# Patient Record
Sex: Female | Born: 1935 | Race: White | Hispanic: No | Smoking: Former smoker
Health system: Southern US, Community
[De-identification: ages and names within clinical notes are randomized; demographics above are authoritative.]

## PROBLEM LIST (undated history)

## (undated) DIAGNOSIS — Z5111 Encounter for antineoplastic chemotherapy: Secondary | ICD-10-CM

## (undated) DIAGNOSIS — E7800 Pure hypercholesterolemia, unspecified: Secondary | ICD-10-CM

## (undated) DIAGNOSIS — J90 Pleural effusion, not elsewhere classified: Secondary | ICD-10-CM

## (undated) DIAGNOSIS — I509 Heart failure, unspecified: Secondary | ICD-10-CM

## (undated) DIAGNOSIS — C3490 Malignant neoplasm of unspecified part of unspecified bronchus or lung: Secondary | ICD-10-CM

## (undated) DIAGNOSIS — I2510 Atherosclerotic heart disease of native coronary artery without angina pectoris: Secondary | ICD-10-CM

## (undated) DIAGNOSIS — G2581 Restless legs syndrome: Secondary | ICD-10-CM

## (undated) DIAGNOSIS — I219 Acute myocardial infarction, unspecified: Secondary | ICD-10-CM

## (undated) DIAGNOSIS — Z7189 Other specified counseling: Secondary | ICD-10-CM

## (undated) DIAGNOSIS — I4891 Unspecified atrial fibrillation: Secondary | ICD-10-CM

## (undated) DIAGNOSIS — E860 Dehydration: Secondary | ICD-10-CM

## (undated) DIAGNOSIS — I059 Rheumatic mitral valve disease, unspecified: Secondary | ICD-10-CM

## (undated) DIAGNOSIS — H7090 Unspecified mastoiditis, unspecified ear: Secondary | ICD-10-CM

## (undated) DIAGNOSIS — I10 Essential (primary) hypertension: Secondary | ICD-10-CM

## (undated) DIAGNOSIS — Z789 Other specified health status: Secondary | ICD-10-CM

## (undated) DIAGNOSIS — R7303 Prediabetes: Secondary | ICD-10-CM

## (undated) DIAGNOSIS — Z9289 Personal history of other medical treatment: Secondary | ICD-10-CM

## (undated) DIAGNOSIS — R634 Abnormal weight loss: Secondary | ICD-10-CM

## (undated) DIAGNOSIS — J189 Pneumonia, unspecified organism: Secondary | ICD-10-CM

## (undated) DIAGNOSIS — I499 Cardiac arrhythmia, unspecified: Secondary | ICD-10-CM

## (undated) DIAGNOSIS — I359 Nonrheumatic aortic valve disorder, unspecified: Secondary | ICD-10-CM

## (undated) DIAGNOSIS — E119 Type 2 diabetes mellitus without complications: Secondary | ICD-10-CM

## (undated) DIAGNOSIS — K219 Gastro-esophageal reflux disease without esophagitis: Secondary | ICD-10-CM

## (undated) DIAGNOSIS — E039 Hypothyroidism, unspecified: Secondary | ICD-10-CM

## (undated) DIAGNOSIS — R0602 Shortness of breath: Secondary | ICD-10-CM

## (undated) DIAGNOSIS — M1990 Unspecified osteoarthritis, unspecified site: Secondary | ICD-10-CM

## (undated) DIAGNOSIS — Z5112 Encounter for antineoplastic immunotherapy: Secondary | ICD-10-CM

## (undated) DIAGNOSIS — I350 Nonrheumatic aortic (valve) stenosis: Secondary | ICD-10-CM

## (undated) DIAGNOSIS — M8580 Other specified disorders of bone density and structure, unspecified site: Secondary | ICD-10-CM

## (undated) DIAGNOSIS — J449 Chronic obstructive pulmonary disease, unspecified: Secondary | ICD-10-CM

## (undated) HISTORY — DX: Other specified counseling: Z71.89

## (undated) HISTORY — PX: LAPAROSCOPIC CHOLECYSTECTOMY: SUR755

## (undated) HISTORY — DX: Other specified health status: Z78.9

## (undated) HISTORY — DX: Malignant neoplasm of unspecified part of unspecified bronchus or lung: C34.90

## (undated) HISTORY — DX: Hypothyroidism, unspecified: E03.9

## (undated) HISTORY — DX: Chronic obstructive pulmonary disease, unspecified: J44.9

## (undated) HISTORY — PX: TONSILLECTOMY: SUR1361

## (undated) HISTORY — DX: Abnormal weight loss: R63.4

## (undated) HISTORY — DX: Encounter for antineoplastic chemotherapy: Z51.11

## (undated) HISTORY — DX: Restless legs syndrome: G25.81

## (undated) HISTORY — DX: Essential (primary) hypertension: I10

## (undated) HISTORY — PX: AORTIC VALVE REPLACEMENT: SHX41

## (undated) HISTORY — DX: Atherosclerotic heart disease of native coronary artery without angina pectoris: I25.10

## (undated) HISTORY — DX: Unspecified atrial fibrillation: I48.91

## (undated) HISTORY — DX: Unspecified mastoiditis, unspecified ear: H70.90

## (undated) HISTORY — DX: Dehydration: E86.0

## (undated) HISTORY — DX: Nonrheumatic aortic (valve) stenosis: I35.0

## (undated) HISTORY — DX: Pure hypercholesterolemia, unspecified: E78.00

## (undated) HISTORY — PX: MITRAL VALVE REPLACEMENT: SHX147

## (undated) HISTORY — DX: Rheumatic mitral valve disease, unspecified: I05.9

## (undated) HISTORY — DX: Nonrheumatic aortic valve disorder, unspecified: I35.9

## (undated) HISTORY — DX: Other specified disorders of bone density and structure, unspecified site: M85.80

## (undated) HISTORY — DX: Encounter for antineoplastic immunotherapy: Z51.12

## (undated) HISTORY — DX: Heart failure, unspecified: I50.9

## (undated) HISTORY — DX: Pleural effusion, not elsewhere classified: J90

---

## 2011-03-22 HISTORY — PX: CORONARY ARTERY BYPASS GRAFT: SHX141

## 2011-03-27 DIAGNOSIS — Z888 Allergy status to other drugs, medicaments and biological substances status: Secondary | ICD-10-CM | POA: Diagnosis not present

## 2011-03-27 DIAGNOSIS — I509 Heart failure, unspecified: Secondary | ICD-10-CM | POA: Diagnosis not present

## 2011-03-27 DIAGNOSIS — I4891 Unspecified atrial fibrillation: Secondary | ICD-10-CM | POA: Diagnosis present

## 2011-03-27 DIAGNOSIS — J449 Chronic obstructive pulmonary disease, unspecified: Secondary | ICD-10-CM | POA: Diagnosis present

## 2011-03-27 DIAGNOSIS — I099 Rheumatic heart disease, unspecified: Secondary | ICD-10-CM | POA: Diagnosis not present

## 2011-03-27 DIAGNOSIS — E876 Hypokalemia: Secondary | ICD-10-CM | POA: Diagnosis not present

## 2011-03-27 DIAGNOSIS — G2581 Restless legs syndrome: Secondary | ICD-10-CM | POA: Diagnosis present

## 2011-03-27 DIAGNOSIS — I359 Nonrheumatic aortic valve disorder, unspecified: Secondary | ICD-10-CM | POA: Diagnosis not present

## 2011-03-27 DIAGNOSIS — K562 Volvulus: Secondary | ICD-10-CM | POA: Diagnosis not present

## 2011-03-27 DIAGNOSIS — K2940 Chronic atrophic gastritis without bleeding: Secondary | ICD-10-CM | POA: Diagnosis not present

## 2011-03-27 DIAGNOSIS — I2510 Atherosclerotic heart disease of native coronary artery without angina pectoris: Secondary | ICD-10-CM | POA: Diagnosis present

## 2011-03-27 DIAGNOSIS — Z7982 Long term (current) use of aspirin: Secondary | ICD-10-CM | POA: Diagnosis not present

## 2011-03-27 DIAGNOSIS — Z87891 Personal history of nicotine dependence: Secondary | ICD-10-CM | POA: Diagnosis not present

## 2011-03-27 DIAGNOSIS — K8020 Calculus of gallbladder without cholecystitis without obstruction: Secondary | ICD-10-CM | POA: Diagnosis not present

## 2011-03-27 DIAGNOSIS — I5031 Acute diastolic (congestive) heart failure: Secondary | ICD-10-CM | POA: Diagnosis not present

## 2011-03-27 DIAGNOSIS — I5033 Acute on chronic diastolic (congestive) heart failure: Secondary | ICD-10-CM | POA: Diagnosis present

## 2011-03-27 DIAGNOSIS — R0609 Other forms of dyspnea: Secondary | ICD-10-CM | POA: Diagnosis not present

## 2011-03-27 DIAGNOSIS — K2970 Gastritis, unspecified, without bleeding: Secondary | ICD-10-CM | POA: Diagnosis present

## 2011-03-27 DIAGNOSIS — I059 Rheumatic mitral valve disease, unspecified: Secondary | ICD-10-CM | POA: Diagnosis not present

## 2011-03-27 DIAGNOSIS — R0602 Shortness of breath: Secondary | ICD-10-CM | POA: Diagnosis not present

## 2011-03-27 DIAGNOSIS — K2990 Gastroduodenitis, unspecified, without bleeding: Secondary | ICD-10-CM | POA: Diagnosis present

## 2011-03-27 DIAGNOSIS — Z954 Presence of other heart-valve replacement: Secondary | ICD-10-CM | POA: Diagnosis not present

## 2011-03-27 DIAGNOSIS — N281 Cyst of kidney, acquired: Secondary | ICD-10-CM | POA: Diagnosis not present

## 2011-03-27 DIAGNOSIS — D509 Iron deficiency anemia, unspecified: Secondary | ICD-10-CM | POA: Diagnosis not present

## 2011-03-27 DIAGNOSIS — Q438 Other specified congenital malformations of intestine: Secondary | ICD-10-CM | POA: Diagnosis not present

## 2011-03-27 DIAGNOSIS — E039 Hypothyroidism, unspecified: Secondary | ICD-10-CM | POA: Diagnosis present

## 2011-03-27 DIAGNOSIS — K219 Gastro-esophageal reflux disease without esophagitis: Secondary | ICD-10-CM | POA: Diagnosis present

## 2011-03-27 DIAGNOSIS — I080 Rheumatic disorders of both mitral and aortic valves: Secondary | ICD-10-CM | POA: Diagnosis not present

## 2011-03-27 DIAGNOSIS — I2789 Other specified pulmonary heart diseases: Secondary | ICD-10-CM | POA: Diagnosis present

## 2011-03-27 DIAGNOSIS — Z951 Presence of aortocoronary bypass graft: Secondary | ICD-10-CM | POA: Diagnosis not present

## 2011-03-27 DIAGNOSIS — I050 Rheumatic mitral stenosis: Secondary | ICD-10-CM | POA: Diagnosis not present

## 2011-03-27 DIAGNOSIS — K5730 Diverticulosis of large intestine without perforation or abscess without bleeding: Secondary | ICD-10-CM | POA: Diagnosis not present

## 2011-04-04 DIAGNOSIS — J9819 Other pulmonary collapse: Secondary | ICD-10-CM | POA: Diagnosis not present

## 2011-04-04 DIAGNOSIS — Z951 Presence of aortocoronary bypass graft: Secondary | ICD-10-CM | POA: Diagnosis not present

## 2011-04-04 DIAGNOSIS — I050 Rheumatic mitral stenosis: Secondary | ICD-10-CM | POA: Diagnosis not present

## 2011-04-04 DIAGNOSIS — T82897A Other specified complication of cardiac prosthetic devices, implants and grafts, initial encounter: Secondary | ICD-10-CM | POA: Diagnosis not present

## 2011-04-04 DIAGNOSIS — N179 Acute kidney failure, unspecified: Secondary | ICD-10-CM | POA: Diagnosis not present

## 2011-04-04 DIAGNOSIS — K219 Gastro-esophageal reflux disease without esophagitis: Secondary | ICD-10-CM | POA: Diagnosis present

## 2011-04-04 DIAGNOSIS — Z8544 Personal history of malignant neoplasm of other female genital organs: Secondary | ICD-10-CM | POA: Diagnosis not present

## 2011-04-04 DIAGNOSIS — I2510 Atherosclerotic heart disease of native coronary artery without angina pectoris: Secondary | ICD-10-CM | POA: Diagnosis not present

## 2011-04-04 DIAGNOSIS — J811 Chronic pulmonary edema: Secondary | ICD-10-CM | POA: Diagnosis not present

## 2011-04-04 DIAGNOSIS — J90 Pleural effusion, not elsewhere classified: Secondary | ICD-10-CM | POA: Diagnosis not present

## 2011-04-04 DIAGNOSIS — R0989 Other specified symptoms and signs involving the circulatory and respiratory systems: Secondary | ICD-10-CM | POA: Diagnosis not present

## 2011-04-04 DIAGNOSIS — I25810 Atherosclerosis of coronary artery bypass graft(s) without angina pectoris: Secondary | ICD-10-CM | POA: Diagnosis not present

## 2011-04-04 DIAGNOSIS — I7090 Unspecified atherosclerosis: Secondary | ICD-10-CM | POA: Diagnosis not present

## 2011-04-04 DIAGNOSIS — J189 Pneumonia, unspecified organism: Secondary | ICD-10-CM | POA: Diagnosis not present

## 2011-04-04 DIAGNOSIS — D72829 Elevated white blood cell count, unspecified: Secondary | ICD-10-CM | POA: Diagnosis not present

## 2011-04-04 DIAGNOSIS — I080 Rheumatic disorders of both mitral and aortic valves: Secondary | ICD-10-CM | POA: Diagnosis present

## 2011-04-04 DIAGNOSIS — I2789 Other specified pulmonary heart diseases: Secondary | ICD-10-CM | POA: Diagnosis not present

## 2011-04-04 DIAGNOSIS — Z954 Presence of other heart-valve replacement: Secondary | ICD-10-CM | POA: Diagnosis not present

## 2011-04-04 DIAGNOSIS — Z9911 Dependence on respirator [ventilator] status: Secondary | ICD-10-CM | POA: Diagnosis not present

## 2011-04-04 DIAGNOSIS — R5081 Fever presenting with conditions classified elsewhere: Secondary | ICD-10-CM | POA: Diagnosis not present

## 2011-04-04 DIAGNOSIS — E039 Hypothyroidism, unspecified: Secondary | ICD-10-CM | POA: Diagnosis present

## 2011-04-04 DIAGNOSIS — R0602 Shortness of breath: Secondary | ICD-10-CM | POA: Diagnosis not present

## 2011-04-04 DIAGNOSIS — D696 Thrombocytopenia, unspecified: Secondary | ICD-10-CM | POA: Diagnosis not present

## 2011-04-04 DIAGNOSIS — I4891 Unspecified atrial fibrillation: Secondary | ICD-10-CM | POA: Diagnosis not present

## 2011-04-04 DIAGNOSIS — I509 Heart failure, unspecified: Secondary | ICD-10-CM | POA: Diagnosis present

## 2011-04-04 DIAGNOSIS — J9620 Acute and chronic respiratory failure, unspecified whether with hypoxia or hypercapnia: Secondary | ICD-10-CM | POA: Diagnosis not present

## 2011-04-04 DIAGNOSIS — I059 Rheumatic mitral valve disease, unspecified: Secondary | ICD-10-CM | POA: Diagnosis not present

## 2011-04-04 DIAGNOSIS — Z9889 Other specified postprocedural states: Secondary | ICD-10-CM | POA: Diagnosis not present

## 2011-04-04 DIAGNOSIS — I359 Nonrheumatic aortic valve disorder, unspecified: Secondary | ICD-10-CM | POA: Diagnosis not present

## 2011-04-04 DIAGNOSIS — N289 Disorder of kidney and ureter, unspecified: Secondary | ICD-10-CM | POA: Diagnosis not present

## 2011-04-04 DIAGNOSIS — J9600 Acute respiratory failure, unspecified whether with hypoxia or hypercapnia: Secondary | ICD-10-CM | POA: Diagnosis not present

## 2011-04-13 DIAGNOSIS — I2510 Atherosclerotic heart disease of native coronary artery without angina pectoris: Secondary | ICD-10-CM | POA: Diagnosis not present

## 2011-04-13 DIAGNOSIS — R0989 Other specified symptoms and signs involving the circulatory and respiratory systems: Secondary | ICD-10-CM | POA: Diagnosis not present

## 2011-04-13 DIAGNOSIS — Z8542 Personal history of malignant neoplasm of other parts of uterus: Secondary | ICD-10-CM | POA: Diagnosis not present

## 2011-04-13 DIAGNOSIS — D500 Iron deficiency anemia secondary to blood loss (chronic): Secondary | ICD-10-CM | POA: Diagnosis not present

## 2011-04-13 DIAGNOSIS — I359 Nonrheumatic aortic valve disorder, unspecified: Secondary | ICD-10-CM | POA: Diagnosis not present

## 2011-04-13 DIAGNOSIS — J449 Chronic obstructive pulmonary disease, unspecified: Secondary | ICD-10-CM | POA: Diagnosis not present

## 2011-04-13 DIAGNOSIS — Z952 Presence of prosthetic heart valve: Secondary | ICD-10-CM | POA: Diagnosis not present

## 2011-04-13 DIAGNOSIS — K219 Gastro-esophageal reflux disease without esophagitis: Secondary | ICD-10-CM | POA: Diagnosis not present

## 2011-04-13 DIAGNOSIS — J90 Pleural effusion, not elsewhere classified: Secondary | ICD-10-CM | POA: Diagnosis not present

## 2011-04-13 DIAGNOSIS — I2789 Other specified pulmonary heart diseases: Secondary | ICD-10-CM | POA: Diagnosis not present

## 2011-04-13 DIAGNOSIS — E871 Hypo-osmolality and hyponatremia: Secondary | ICD-10-CM | POA: Diagnosis not present

## 2011-04-13 DIAGNOSIS — R0609 Other forms of dyspnea: Secondary | ICD-10-CM | POA: Diagnosis not present

## 2011-04-13 DIAGNOSIS — J189 Pneumonia, unspecified organism: Secondary | ICD-10-CM | POA: Diagnosis not present

## 2011-04-13 DIAGNOSIS — I501 Left ventricular failure: Secondary | ICD-10-CM | POA: Diagnosis not present

## 2011-04-13 DIAGNOSIS — Z48812 Encounter for surgical aftercare following surgery on the circulatory system: Secondary | ICD-10-CM | POA: Diagnosis not present

## 2011-04-13 DIAGNOSIS — IMO0002 Reserved for concepts with insufficient information to code with codable children: Secondary | ICD-10-CM | POA: Diagnosis not present

## 2011-04-13 DIAGNOSIS — J438 Other emphysema: Secondary | ICD-10-CM | POA: Diagnosis not present

## 2011-04-13 DIAGNOSIS — I099 Rheumatic heart disease, unspecified: Secondary | ICD-10-CM | POA: Diagnosis not present

## 2011-04-13 DIAGNOSIS — Z9889 Other specified postprocedural states: Secondary | ICD-10-CM | POA: Diagnosis not present

## 2011-04-13 DIAGNOSIS — J9819 Other pulmonary collapse: Secondary | ICD-10-CM | POA: Diagnosis not present

## 2011-04-13 DIAGNOSIS — I509 Heart failure, unspecified: Secondary | ICD-10-CM | POA: Diagnosis not present

## 2011-04-13 DIAGNOSIS — E46 Unspecified protein-calorie malnutrition: Secondary | ICD-10-CM | POA: Diagnosis not present

## 2011-04-13 DIAGNOSIS — I059 Rheumatic mitral valve disease, unspecified: Secondary | ICD-10-CM | POA: Diagnosis not present

## 2011-04-13 DIAGNOSIS — R5381 Other malaise: Secondary | ICD-10-CM | POA: Diagnosis not present

## 2011-04-13 DIAGNOSIS — I4891 Unspecified atrial fibrillation: Secondary | ICD-10-CM | POA: Diagnosis not present

## 2011-04-13 DIAGNOSIS — Z951 Presence of aortocoronary bypass graft: Secondary | ICD-10-CM | POA: Diagnosis not present

## 2011-04-13 DIAGNOSIS — E039 Hypothyroidism, unspecified: Secondary | ICD-10-CM | POA: Diagnosis not present

## 2011-04-14 DIAGNOSIS — J90 Pleural effusion, not elsewhere classified: Secondary | ICD-10-CM | POA: Diagnosis not present

## 2011-04-14 DIAGNOSIS — I359 Nonrheumatic aortic valve disorder, unspecified: Secondary | ICD-10-CM | POA: Diagnosis not present

## 2011-04-14 DIAGNOSIS — J449 Chronic obstructive pulmonary disease, unspecified: Secondary | ICD-10-CM | POA: Diagnosis not present

## 2011-04-14 DIAGNOSIS — E039 Hypothyroidism, unspecified: Secondary | ICD-10-CM | POA: Diagnosis not present

## 2011-04-14 DIAGNOSIS — J438 Other emphysema: Secondary | ICD-10-CM | POA: Diagnosis not present

## 2011-04-14 DIAGNOSIS — I509 Heart failure, unspecified: Secondary | ICD-10-CM | POA: Diagnosis not present

## 2011-04-14 DIAGNOSIS — I059 Rheumatic mitral valve disease, unspecified: Secondary | ICD-10-CM | POA: Diagnosis not present

## 2011-04-16 DIAGNOSIS — I359 Nonrheumatic aortic valve disorder, unspecified: Secondary | ICD-10-CM | POA: Diagnosis not present

## 2011-04-16 DIAGNOSIS — I059 Rheumatic mitral valve disease, unspecified: Secondary | ICD-10-CM | POA: Diagnosis not present

## 2011-04-16 DIAGNOSIS — J438 Other emphysema: Secondary | ICD-10-CM | POA: Diagnosis not present

## 2011-04-16 DIAGNOSIS — E039 Hypothyroidism, unspecified: Secondary | ICD-10-CM | POA: Diagnosis not present

## 2011-04-17 DIAGNOSIS — I359 Nonrheumatic aortic valve disorder, unspecified: Secondary | ICD-10-CM | POA: Diagnosis not present

## 2011-04-17 DIAGNOSIS — J438 Other emphysema: Secondary | ICD-10-CM | POA: Diagnosis not present

## 2011-04-17 DIAGNOSIS — I059 Rheumatic mitral valve disease, unspecified: Secondary | ICD-10-CM | POA: Diagnosis not present

## 2011-04-17 DIAGNOSIS — E039 Hypothyroidism, unspecified: Secondary | ICD-10-CM | POA: Diagnosis not present

## 2011-04-23 DIAGNOSIS — J441 Chronic obstructive pulmonary disease with (acute) exacerbation: Secondary | ICD-10-CM | POA: Diagnosis not present

## 2011-04-23 DIAGNOSIS — R5383 Other fatigue: Secondary | ICD-10-CM | POA: Diagnosis not present

## 2011-04-23 DIAGNOSIS — Z48812 Encounter for surgical aftercare following surgery on the circulatory system: Secondary | ICD-10-CM | POA: Diagnosis not present

## 2011-04-23 DIAGNOSIS — I509 Heart failure, unspecified: Secondary | ICD-10-CM | POA: Diagnosis not present

## 2011-04-23 DIAGNOSIS — I2789 Other specified pulmonary heart diseases: Secondary | ICD-10-CM | POA: Diagnosis not present

## 2011-04-23 DIAGNOSIS — R5381 Other malaise: Secondary | ICD-10-CM | POA: Diagnosis not present

## 2011-04-24 DIAGNOSIS — I509 Heart failure, unspecified: Secondary | ICD-10-CM | POA: Diagnosis not present

## 2011-04-24 DIAGNOSIS — R5383 Other fatigue: Secondary | ICD-10-CM | POA: Diagnosis not present

## 2011-04-24 DIAGNOSIS — J441 Chronic obstructive pulmonary disease with (acute) exacerbation: Secondary | ICD-10-CM | POA: Diagnosis not present

## 2011-04-24 DIAGNOSIS — I2789 Other specified pulmonary heart diseases: Secondary | ICD-10-CM | POA: Diagnosis not present

## 2011-04-24 DIAGNOSIS — R5381 Other malaise: Secondary | ICD-10-CM | POA: Diagnosis not present

## 2011-04-24 DIAGNOSIS — Z48812 Encounter for surgical aftercare following surgery on the circulatory system: Secondary | ICD-10-CM | POA: Diagnosis not present

## 2011-04-25 DIAGNOSIS — J441 Chronic obstructive pulmonary disease with (acute) exacerbation: Secondary | ICD-10-CM | POA: Diagnosis not present

## 2011-04-25 DIAGNOSIS — I2789 Other specified pulmonary heart diseases: Secondary | ICD-10-CM | POA: Diagnosis not present

## 2011-04-25 DIAGNOSIS — Z48812 Encounter for surgical aftercare following surgery on the circulatory system: Secondary | ICD-10-CM | POA: Diagnosis not present

## 2011-04-25 DIAGNOSIS — I509 Heart failure, unspecified: Secondary | ICD-10-CM | POA: Diagnosis not present

## 2011-04-25 DIAGNOSIS — R5383 Other fatigue: Secondary | ICD-10-CM | POA: Diagnosis not present

## 2011-04-26 DIAGNOSIS — I2789 Other specified pulmonary heart diseases: Secondary | ICD-10-CM | POA: Diagnosis not present

## 2011-04-26 DIAGNOSIS — J441 Chronic obstructive pulmonary disease with (acute) exacerbation: Secondary | ICD-10-CM | POA: Diagnosis not present

## 2011-04-26 DIAGNOSIS — Z48812 Encounter for surgical aftercare following surgery on the circulatory system: Secondary | ICD-10-CM | POA: Diagnosis not present

## 2011-04-26 DIAGNOSIS — R5381 Other malaise: Secondary | ICD-10-CM | POA: Diagnosis not present

## 2011-04-26 DIAGNOSIS — I509 Heart failure, unspecified: Secondary | ICD-10-CM | POA: Diagnosis not present

## 2011-04-28 DIAGNOSIS — R0602 Shortness of breath: Secondary | ICD-10-CM | POA: Diagnosis not present

## 2011-04-28 DIAGNOSIS — I509 Heart failure, unspecified: Secondary | ICD-10-CM | POA: Diagnosis not present

## 2011-04-28 DIAGNOSIS — I2789 Other specified pulmonary heart diseases: Secondary | ICD-10-CM | POA: Diagnosis not present

## 2011-04-28 DIAGNOSIS — R5381 Other malaise: Secondary | ICD-10-CM | POA: Diagnosis not present

## 2011-04-28 DIAGNOSIS — Z48812 Encounter for surgical aftercare following surgery on the circulatory system: Secondary | ICD-10-CM | POA: Diagnosis not present

## 2011-04-28 DIAGNOSIS — J441 Chronic obstructive pulmonary disease with (acute) exacerbation: Secondary | ICD-10-CM | POA: Diagnosis not present

## 2011-04-29 DIAGNOSIS — I2789 Other specified pulmonary heart diseases: Secondary | ICD-10-CM | POA: Diagnosis not present

## 2011-04-29 DIAGNOSIS — I509 Heart failure, unspecified: Secondary | ICD-10-CM | POA: Diagnosis not present

## 2011-04-29 DIAGNOSIS — J441 Chronic obstructive pulmonary disease with (acute) exacerbation: Secondary | ICD-10-CM | POA: Diagnosis not present

## 2011-04-29 DIAGNOSIS — Z48812 Encounter for surgical aftercare following surgery on the circulatory system: Secondary | ICD-10-CM | POA: Diagnosis not present

## 2011-04-29 DIAGNOSIS — R5381 Other malaise: Secondary | ICD-10-CM | POA: Diagnosis not present

## 2011-05-02 DIAGNOSIS — I2789 Other specified pulmonary heart diseases: Secondary | ICD-10-CM | POA: Diagnosis not present

## 2011-05-02 DIAGNOSIS — I509 Heart failure, unspecified: Secondary | ICD-10-CM | POA: Diagnosis not present

## 2011-05-02 DIAGNOSIS — Z48812 Encounter for surgical aftercare following surgery on the circulatory system: Secondary | ICD-10-CM | POA: Diagnosis not present

## 2011-05-02 DIAGNOSIS — J441 Chronic obstructive pulmonary disease with (acute) exacerbation: Secondary | ICD-10-CM | POA: Diagnosis not present

## 2011-05-02 DIAGNOSIS — R5381 Other malaise: Secondary | ICD-10-CM | POA: Diagnosis not present

## 2011-05-04 DIAGNOSIS — I099 Rheumatic heart disease, unspecified: Secondary | ICD-10-CM | POA: Diagnosis not present

## 2011-05-04 DIAGNOSIS — I2510 Atherosclerotic heart disease of native coronary artery without angina pectoris: Secondary | ICD-10-CM | POA: Diagnosis not present

## 2011-05-04 DIAGNOSIS — I509 Heart failure, unspecified: Secondary | ICD-10-CM | POA: Diagnosis not present

## 2011-05-05 DIAGNOSIS — I509 Heart failure, unspecified: Secondary | ICD-10-CM | POA: Diagnosis not present

## 2011-05-05 DIAGNOSIS — J441 Chronic obstructive pulmonary disease with (acute) exacerbation: Secondary | ICD-10-CM | POA: Diagnosis not present

## 2011-05-05 DIAGNOSIS — R5381 Other malaise: Secondary | ICD-10-CM | POA: Diagnosis not present

## 2011-05-05 DIAGNOSIS — I2789 Other specified pulmonary heart diseases: Secondary | ICD-10-CM | POA: Diagnosis not present

## 2011-05-05 DIAGNOSIS — Z48812 Encounter for surgical aftercare following surgery on the circulatory system: Secondary | ICD-10-CM | POA: Diagnosis not present

## 2011-05-06 DIAGNOSIS — I10 Essential (primary) hypertension: Secondary | ICD-10-CM | POA: Diagnosis not present

## 2011-05-06 DIAGNOSIS — I2510 Atherosclerotic heart disease of native coronary artery without angina pectoris: Secondary | ICD-10-CM | POA: Diagnosis not present

## 2011-05-09 DIAGNOSIS — Z48812 Encounter for surgical aftercare following surgery on the circulatory system: Secondary | ICD-10-CM | POA: Diagnosis not present

## 2011-05-09 DIAGNOSIS — J441 Chronic obstructive pulmonary disease with (acute) exacerbation: Secondary | ICD-10-CM | POA: Diagnosis not present

## 2011-05-09 DIAGNOSIS — R5383 Other fatigue: Secondary | ICD-10-CM | POA: Diagnosis not present

## 2011-05-09 DIAGNOSIS — I509 Heart failure, unspecified: Secondary | ICD-10-CM | POA: Diagnosis not present

## 2011-05-09 DIAGNOSIS — I2789 Other specified pulmonary heart diseases: Secondary | ICD-10-CM | POA: Diagnosis not present

## 2011-05-09 DIAGNOSIS — R5381 Other malaise: Secondary | ICD-10-CM | POA: Diagnosis not present

## 2011-05-10 DIAGNOSIS — Z48812 Encounter for surgical aftercare following surgery on the circulatory system: Secondary | ICD-10-CM | POA: Diagnosis not present

## 2011-05-10 DIAGNOSIS — R5383 Other fatigue: Secondary | ICD-10-CM | POA: Diagnosis not present

## 2011-05-10 DIAGNOSIS — J441 Chronic obstructive pulmonary disease with (acute) exacerbation: Secondary | ICD-10-CM | POA: Diagnosis not present

## 2011-05-10 DIAGNOSIS — I509 Heart failure, unspecified: Secondary | ICD-10-CM | POA: Diagnosis not present

## 2011-05-10 DIAGNOSIS — I2789 Other specified pulmonary heart diseases: Secondary | ICD-10-CM | POA: Diagnosis not present

## 2011-05-13 DIAGNOSIS — I2789 Other specified pulmonary heart diseases: Secondary | ICD-10-CM | POA: Diagnosis not present

## 2011-05-13 DIAGNOSIS — Z48812 Encounter for surgical aftercare following surgery on the circulatory system: Secondary | ICD-10-CM | POA: Diagnosis not present

## 2011-05-13 DIAGNOSIS — J441 Chronic obstructive pulmonary disease with (acute) exacerbation: Secondary | ICD-10-CM | POA: Diagnosis not present

## 2011-05-13 DIAGNOSIS — R5381 Other malaise: Secondary | ICD-10-CM | POA: Diagnosis not present

## 2011-05-13 DIAGNOSIS — I509 Heart failure, unspecified: Secondary | ICD-10-CM | POA: Diagnosis not present

## 2011-05-17 DIAGNOSIS — I509 Heart failure, unspecified: Secondary | ICD-10-CM | POA: Diagnosis not present

## 2011-05-17 DIAGNOSIS — I2789 Other specified pulmonary heart diseases: Secondary | ICD-10-CM | POA: Diagnosis not present

## 2011-05-17 DIAGNOSIS — R5381 Other malaise: Secondary | ICD-10-CM | POA: Diagnosis not present

## 2011-05-17 DIAGNOSIS — J441 Chronic obstructive pulmonary disease with (acute) exacerbation: Secondary | ICD-10-CM | POA: Diagnosis not present

## 2011-05-17 DIAGNOSIS — Z48812 Encounter for surgical aftercare following surgery on the circulatory system: Secondary | ICD-10-CM | POA: Diagnosis not present

## 2011-05-18 DIAGNOSIS — J449 Chronic obstructive pulmonary disease, unspecified: Secondary | ICD-10-CM | POA: Diagnosis not present

## 2011-05-18 DIAGNOSIS — R0602 Shortness of breath: Secondary | ICD-10-CM | POA: Diagnosis not present

## 2011-05-20 DIAGNOSIS — I509 Heart failure, unspecified: Secondary | ICD-10-CM | POA: Diagnosis not present

## 2011-05-20 DIAGNOSIS — J441 Chronic obstructive pulmonary disease with (acute) exacerbation: Secondary | ICD-10-CM | POA: Diagnosis not present

## 2011-05-20 DIAGNOSIS — I2789 Other specified pulmonary heart diseases: Secondary | ICD-10-CM | POA: Diagnosis not present

## 2011-05-20 DIAGNOSIS — R5381 Other malaise: Secondary | ICD-10-CM | POA: Diagnosis not present

## 2011-05-20 DIAGNOSIS — Z48812 Encounter for surgical aftercare following surgery on the circulatory system: Secondary | ICD-10-CM | POA: Diagnosis not present

## 2011-05-21 DIAGNOSIS — I2789 Other specified pulmonary heart diseases: Secondary | ICD-10-CM | POA: Diagnosis not present

## 2011-05-21 DIAGNOSIS — Z48812 Encounter for surgical aftercare following surgery on the circulatory system: Secondary | ICD-10-CM | POA: Diagnosis not present

## 2011-05-21 DIAGNOSIS — R5381 Other malaise: Secondary | ICD-10-CM | POA: Diagnosis not present

## 2011-05-21 DIAGNOSIS — J441 Chronic obstructive pulmonary disease with (acute) exacerbation: Secondary | ICD-10-CM | POA: Diagnosis not present

## 2011-05-21 DIAGNOSIS — I509 Heart failure, unspecified: Secondary | ICD-10-CM | POA: Diagnosis not present

## 2011-05-22 DIAGNOSIS — Z48812 Encounter for surgical aftercare following surgery on the circulatory system: Secondary | ICD-10-CM | POA: Diagnosis not present

## 2011-05-22 DIAGNOSIS — J441 Chronic obstructive pulmonary disease with (acute) exacerbation: Secondary | ICD-10-CM | POA: Diagnosis not present

## 2011-05-22 DIAGNOSIS — I509 Heart failure, unspecified: Secondary | ICD-10-CM | POA: Diagnosis not present

## 2011-05-22 DIAGNOSIS — I2789 Other specified pulmonary heart diseases: Secondary | ICD-10-CM | POA: Diagnosis not present

## 2011-05-22 DIAGNOSIS — R5381 Other malaise: Secondary | ICD-10-CM | POA: Diagnosis not present

## 2011-05-23 DIAGNOSIS — I2789 Other specified pulmonary heart diseases: Secondary | ICD-10-CM | POA: Diagnosis not present

## 2011-05-23 DIAGNOSIS — J441 Chronic obstructive pulmonary disease with (acute) exacerbation: Secondary | ICD-10-CM | POA: Diagnosis not present

## 2011-05-23 DIAGNOSIS — R5381 Other malaise: Secondary | ICD-10-CM | POA: Diagnosis not present

## 2011-05-23 DIAGNOSIS — Z48812 Encounter for surgical aftercare following surgery on the circulatory system: Secondary | ICD-10-CM | POA: Diagnosis not present

## 2011-05-23 DIAGNOSIS — I509 Heart failure, unspecified: Secondary | ICD-10-CM | POA: Diagnosis not present

## 2011-05-24 DIAGNOSIS — R5383 Other fatigue: Secondary | ICD-10-CM | POA: Diagnosis not present

## 2011-05-24 DIAGNOSIS — R5381 Other malaise: Secondary | ICD-10-CM | POA: Diagnosis not present

## 2011-05-24 DIAGNOSIS — J441 Chronic obstructive pulmonary disease with (acute) exacerbation: Secondary | ICD-10-CM | POA: Diagnosis not present

## 2011-05-24 DIAGNOSIS — I2789 Other specified pulmonary heart diseases: Secondary | ICD-10-CM | POA: Diagnosis not present

## 2011-05-24 DIAGNOSIS — I509 Heart failure, unspecified: Secondary | ICD-10-CM | POA: Diagnosis not present

## 2011-05-24 DIAGNOSIS — Z48812 Encounter for surgical aftercare following surgery on the circulatory system: Secondary | ICD-10-CM | POA: Diagnosis not present

## 2011-05-25 DIAGNOSIS — R079 Chest pain, unspecified: Secondary | ICD-10-CM | POA: Diagnosis not present

## 2011-05-25 DIAGNOSIS — K8020 Calculus of gallbladder without cholecystitis without obstruction: Secondary | ICD-10-CM | POA: Diagnosis not present

## 2011-05-25 DIAGNOSIS — Z48812 Encounter for surgical aftercare following surgery on the circulatory system: Secondary | ICD-10-CM | POA: Diagnosis not present

## 2011-05-25 DIAGNOSIS — I509 Heart failure, unspecified: Secondary | ICD-10-CM | POA: Diagnosis not present

## 2011-05-25 DIAGNOSIS — I2789 Other specified pulmonary heart diseases: Secondary | ICD-10-CM | POA: Diagnosis not present

## 2011-05-25 DIAGNOSIS — J441 Chronic obstructive pulmonary disease with (acute) exacerbation: Secondary | ICD-10-CM | POA: Diagnosis not present

## 2011-05-25 DIAGNOSIS — R5383 Other fatigue: Secondary | ICD-10-CM | POA: Diagnosis not present

## 2011-05-25 DIAGNOSIS — J9819 Other pulmonary collapse: Secondary | ICD-10-CM | POA: Diagnosis not present

## 2011-05-26 DIAGNOSIS — J441 Chronic obstructive pulmonary disease with (acute) exacerbation: Secondary | ICD-10-CM | POA: Diagnosis not present

## 2011-05-26 DIAGNOSIS — Z48812 Encounter for surgical aftercare following surgery on the circulatory system: Secondary | ICD-10-CM | POA: Diagnosis not present

## 2011-05-26 DIAGNOSIS — R5381 Other malaise: Secondary | ICD-10-CM | POA: Diagnosis not present

## 2011-05-26 DIAGNOSIS — K8020 Calculus of gallbladder without cholecystitis without obstruction: Secondary | ICD-10-CM | POA: Diagnosis not present

## 2011-05-26 DIAGNOSIS — I509 Heart failure, unspecified: Secondary | ICD-10-CM | POA: Diagnosis not present

## 2011-05-26 DIAGNOSIS — I2789 Other specified pulmonary heart diseases: Secondary | ICD-10-CM | POA: Diagnosis not present

## 2011-05-27 DIAGNOSIS — R1011 Right upper quadrant pain: Secondary | ICD-10-CM | POA: Diagnosis not present

## 2011-05-27 DIAGNOSIS — I2789 Other specified pulmonary heart diseases: Secondary | ICD-10-CM | POA: Diagnosis not present

## 2011-05-27 DIAGNOSIS — R5381 Other malaise: Secondary | ICD-10-CM | POA: Diagnosis not present

## 2011-05-27 DIAGNOSIS — R1013 Epigastric pain: Secondary | ICD-10-CM | POA: Diagnosis not present

## 2011-05-27 DIAGNOSIS — I509 Heart failure, unspecified: Secondary | ICD-10-CM | POA: Diagnosis not present

## 2011-05-27 DIAGNOSIS — I359 Nonrheumatic aortic valve disorder, unspecified: Secondary | ICD-10-CM | POA: Diagnosis not present

## 2011-05-27 DIAGNOSIS — I059 Rheumatic mitral valve disease, unspecified: Secondary | ICD-10-CM | POA: Diagnosis not present

## 2011-05-27 DIAGNOSIS — J441 Chronic obstructive pulmonary disease with (acute) exacerbation: Secondary | ICD-10-CM | POA: Diagnosis not present

## 2011-05-27 DIAGNOSIS — Z48812 Encounter for surgical aftercare following surgery on the circulatory system: Secondary | ICD-10-CM | POA: Diagnosis not present

## 2011-05-30 DIAGNOSIS — J441 Chronic obstructive pulmonary disease with (acute) exacerbation: Secondary | ICD-10-CM | POA: Diagnosis not present

## 2011-05-30 DIAGNOSIS — I2789 Other specified pulmonary heart diseases: Secondary | ICD-10-CM | POA: Diagnosis not present

## 2011-05-30 DIAGNOSIS — R5381 Other malaise: Secondary | ICD-10-CM | POA: Diagnosis not present

## 2011-05-30 DIAGNOSIS — Z48812 Encounter for surgical aftercare following surgery on the circulatory system: Secondary | ICD-10-CM | POA: Diagnosis not present

## 2011-05-30 DIAGNOSIS — I509 Heart failure, unspecified: Secondary | ICD-10-CM | POA: Diagnosis not present

## 2011-06-01 DIAGNOSIS — R1013 Epigastric pain: Secondary | ICD-10-CM | POA: Diagnosis not present

## 2011-06-06 DIAGNOSIS — I509 Heart failure, unspecified: Secondary | ICD-10-CM | POA: Diagnosis not present

## 2011-06-06 DIAGNOSIS — I2789 Other specified pulmonary heart diseases: Secondary | ICD-10-CM | POA: Diagnosis not present

## 2011-06-06 DIAGNOSIS — J441 Chronic obstructive pulmonary disease with (acute) exacerbation: Secondary | ICD-10-CM | POA: Diagnosis not present

## 2011-06-06 DIAGNOSIS — R5381 Other malaise: Secondary | ICD-10-CM | POA: Diagnosis not present

## 2011-06-06 DIAGNOSIS — Z48812 Encounter for surgical aftercare following surgery on the circulatory system: Secondary | ICD-10-CM | POA: Diagnosis not present

## 2011-06-07 DIAGNOSIS — J90 Pleural effusion, not elsewhere classified: Secondary | ICD-10-CM | POA: Diagnosis not present

## 2011-06-07 DIAGNOSIS — R1011 Right upper quadrant pain: Secondary | ICD-10-CM | POA: Diagnosis not present

## 2011-06-07 DIAGNOSIS — K8020 Calculus of gallbladder without cholecystitis without obstruction: Secondary | ICD-10-CM | POA: Diagnosis not present

## 2011-06-14 DIAGNOSIS — Z48812 Encounter for surgical aftercare following surgery on the circulatory system: Secondary | ICD-10-CM | POA: Diagnosis not present

## 2011-06-14 DIAGNOSIS — I2789 Other specified pulmonary heart diseases: Secondary | ICD-10-CM | POA: Diagnosis not present

## 2011-06-14 DIAGNOSIS — I509 Heart failure, unspecified: Secondary | ICD-10-CM | POA: Diagnosis not present

## 2011-06-14 DIAGNOSIS — R5381 Other malaise: Secondary | ICD-10-CM | POA: Diagnosis not present

## 2011-06-14 DIAGNOSIS — J441 Chronic obstructive pulmonary disease with (acute) exacerbation: Secondary | ICD-10-CM | POA: Diagnosis not present

## 2011-06-15 DIAGNOSIS — E163 Increased secretion of glucagon: Secondary | ICD-10-CM | POA: Diagnosis not present

## 2011-06-15 DIAGNOSIS — E559 Vitamin D deficiency, unspecified: Secondary | ICD-10-CM | POA: Diagnosis not present

## 2011-06-15 DIAGNOSIS — M810 Age-related osteoporosis without current pathological fracture: Secondary | ICD-10-CM | POA: Diagnosis not present

## 2011-06-15 DIAGNOSIS — E063 Autoimmune thyroiditis: Secondary | ICD-10-CM | POA: Diagnosis not present

## 2011-06-28 DIAGNOSIS — I4891 Unspecified atrial fibrillation: Secondary | ICD-10-CM | POA: Diagnosis not present

## 2011-06-28 DIAGNOSIS — I509 Heart failure, unspecified: Secondary | ICD-10-CM | POA: Diagnosis not present

## 2011-06-28 DIAGNOSIS — I10 Essential (primary) hypertension: Secondary | ICD-10-CM | POA: Diagnosis not present

## 2011-06-28 DIAGNOSIS — K8020 Calculus of gallbladder without cholecystitis without obstruction: Secondary | ICD-10-CM | POA: Diagnosis not present

## 2011-07-19 DIAGNOSIS — E049 Nontoxic goiter, unspecified: Secondary | ICD-10-CM | POA: Diagnosis not present

## 2011-07-19 DIAGNOSIS — H905 Unspecified sensorineural hearing loss: Secondary | ICD-10-CM | POA: Diagnosis not present

## 2011-07-19 DIAGNOSIS — H908 Mixed conductive and sensorineural hearing loss, unspecified: Secondary | ICD-10-CM | POA: Diagnosis not present

## 2011-07-19 DIAGNOSIS — H7010 Chronic mastoiditis, unspecified ear: Secondary | ICD-10-CM | POA: Diagnosis not present

## 2011-07-19 DIAGNOSIS — R1310 Dysphagia, unspecified: Secondary | ICD-10-CM | POA: Diagnosis not present

## 2011-07-19 DIAGNOSIS — K219 Gastro-esophageal reflux disease without esophagitis: Secondary | ICD-10-CM | POA: Diagnosis not present

## 2011-07-19 DIAGNOSIS — H9319 Tinnitus, unspecified ear: Secondary | ICD-10-CM | POA: Diagnosis not present

## 2011-07-22 DIAGNOSIS — K811 Chronic cholecystitis: Secondary | ICD-10-CM | POA: Diagnosis not present

## 2011-08-02 DIAGNOSIS — K828 Other specified diseases of gallbladder: Secondary | ICD-10-CM | POA: Diagnosis not present

## 2011-08-02 DIAGNOSIS — J9819 Other pulmonary collapse: Secondary | ICD-10-CM | POA: Diagnosis not present

## 2011-08-02 DIAGNOSIS — M19079 Primary osteoarthritis, unspecified ankle and foot: Secondary | ICD-10-CM | POA: Diagnosis not present

## 2011-08-02 DIAGNOSIS — M25579 Pain in unspecified ankle and joints of unspecified foot: Secondary | ICD-10-CM | POA: Diagnosis not present

## 2011-08-02 DIAGNOSIS — K8064 Calculus of gallbladder and bile duct with chronic cholecystitis without obstruction: Secondary | ICD-10-CM | POA: Diagnosis not present

## 2011-08-02 DIAGNOSIS — J90 Pleural effusion, not elsewhere classified: Secondary | ICD-10-CM | POA: Diagnosis not present

## 2011-08-02 DIAGNOSIS — Z01818 Encounter for other preprocedural examination: Secondary | ICD-10-CM | POA: Diagnosis not present

## 2011-08-03 DIAGNOSIS — J90 Pleural effusion, not elsewhere classified: Secondary | ICD-10-CM | POA: Diagnosis not present

## 2011-08-03 DIAGNOSIS — K8064 Calculus of gallbladder and bile duct with chronic cholecystitis without obstruction: Secondary | ICD-10-CM | POA: Diagnosis not present

## 2011-08-03 DIAGNOSIS — K828 Other specified diseases of gallbladder: Secondary | ICD-10-CM | POA: Diagnosis not present

## 2011-08-03 DIAGNOSIS — K819 Cholecystitis, unspecified: Secondary | ICD-10-CM | POA: Diagnosis not present

## 2011-08-03 DIAGNOSIS — K8020 Calculus of gallbladder without cholecystitis without obstruction: Secondary | ICD-10-CM | POA: Diagnosis not present

## 2011-08-03 DIAGNOSIS — J9819 Other pulmonary collapse: Secondary | ICD-10-CM | POA: Diagnosis not present

## 2011-08-03 DIAGNOSIS — K811 Chronic cholecystitis: Secondary | ICD-10-CM | POA: Diagnosis not present

## 2011-08-08 DIAGNOSIS — I059 Rheumatic mitral valve disease, unspecified: Secondary | ICD-10-CM | POA: Diagnosis not present

## 2011-08-08 DIAGNOSIS — I099 Rheumatic heart disease, unspecified: Secondary | ICD-10-CM | POA: Diagnosis not present

## 2011-08-08 DIAGNOSIS — I509 Heart failure, unspecified: Secondary | ICD-10-CM | POA: Diagnosis not present

## 2011-08-09 DIAGNOSIS — M2010 Hallux valgus (acquired), unspecified foot: Secondary | ICD-10-CM | POA: Diagnosis not present

## 2011-08-17 DIAGNOSIS — R0602 Shortness of breath: Secondary | ICD-10-CM | POA: Diagnosis not present

## 2011-09-28 DIAGNOSIS — I509 Heart failure, unspecified: Secondary | ICD-10-CM | POA: Diagnosis not present

## 2011-09-28 DIAGNOSIS — I4891 Unspecified atrial fibrillation: Secondary | ICD-10-CM | POA: Diagnosis not present

## 2011-09-28 DIAGNOSIS — I10 Essential (primary) hypertension: Secondary | ICD-10-CM | POA: Diagnosis not present

## 2011-10-31 DIAGNOSIS — I509 Heart failure, unspecified: Secondary | ICD-10-CM | POA: Diagnosis not present

## 2011-10-31 DIAGNOSIS — E785 Hyperlipidemia, unspecified: Secondary | ICD-10-CM | POA: Diagnosis not present

## 2011-10-31 DIAGNOSIS — I10 Essential (primary) hypertension: Secondary | ICD-10-CM | POA: Diagnosis not present

## 2011-10-31 DIAGNOSIS — J209 Acute bronchitis, unspecified: Secondary | ICD-10-CM | POA: Diagnosis not present

## 2011-11-09 DIAGNOSIS — I099 Rheumatic heart disease, unspecified: Secondary | ICD-10-CM | POA: Diagnosis not present

## 2011-11-09 DIAGNOSIS — I2510 Atherosclerotic heart disease of native coronary artery without angina pectoris: Secondary | ICD-10-CM | POA: Diagnosis not present

## 2011-12-13 DIAGNOSIS — E163 Increased secretion of glucagon: Secondary | ICD-10-CM | POA: Diagnosis not present

## 2011-12-13 DIAGNOSIS — E559 Vitamin D deficiency, unspecified: Secondary | ICD-10-CM | POA: Diagnosis not present

## 2011-12-13 DIAGNOSIS — E063 Autoimmune thyroiditis: Secondary | ICD-10-CM | POA: Diagnosis not present

## 2011-12-13 DIAGNOSIS — M810 Age-related osteoporosis without current pathological fracture: Secondary | ICD-10-CM | POA: Diagnosis not present

## 2012-01-04 DIAGNOSIS — E785 Hyperlipidemia, unspecified: Secondary | ICD-10-CM | POA: Diagnosis not present

## 2012-01-04 DIAGNOSIS — I10 Essential (primary) hypertension: Secondary | ICD-10-CM | POA: Diagnosis not present

## 2012-01-11 DIAGNOSIS — I10 Essential (primary) hypertension: Secondary | ICD-10-CM | POA: Diagnosis not present

## 2012-01-11 DIAGNOSIS — I509 Heart failure, unspecified: Secondary | ICD-10-CM | POA: Diagnosis not present

## 2012-01-31 DIAGNOSIS — E163 Increased secretion of glucagon: Secondary | ICD-10-CM | POA: Diagnosis not present

## 2012-01-31 DIAGNOSIS — E063 Autoimmune thyroiditis: Secondary | ICD-10-CM | POA: Diagnosis not present

## 2012-02-13 DIAGNOSIS — Z1231 Encounter for screening mammogram for malignant neoplasm of breast: Secondary | ICD-10-CM | POA: Diagnosis not present

## 2012-02-13 DIAGNOSIS — I10 Essential (primary) hypertension: Secondary | ICD-10-CM | POA: Diagnosis not present

## 2012-02-13 DIAGNOSIS — N951 Menopausal and female climacteric states: Secondary | ICD-10-CM | POA: Diagnosis not present

## 2012-02-13 DIAGNOSIS — I509 Heart failure, unspecified: Secondary | ICD-10-CM | POA: Diagnosis not present

## 2012-04-16 DIAGNOSIS — E7800 Pure hypercholesterolemia, unspecified: Secondary | ICD-10-CM | POA: Diagnosis not present

## 2012-04-16 DIAGNOSIS — Z79899 Other long term (current) drug therapy: Secondary | ICD-10-CM | POA: Diagnosis not present

## 2012-04-16 DIAGNOSIS — Z1331 Encounter for screening for depression: Secondary | ICD-10-CM | POA: Diagnosis not present

## 2012-04-16 DIAGNOSIS — E039 Hypothyroidism, unspecified: Secondary | ICD-10-CM | POA: Diagnosis not present

## 2012-04-16 DIAGNOSIS — I2510 Atherosclerotic heart disease of native coronary artery without angina pectoris: Secondary | ICD-10-CM | POA: Diagnosis not present

## 2012-04-16 DIAGNOSIS — I059 Rheumatic mitral valve disease, unspecified: Secondary | ICD-10-CM | POA: Diagnosis not present

## 2012-04-16 DIAGNOSIS — I10 Essential (primary) hypertension: Secondary | ICD-10-CM | POA: Diagnosis not present

## 2012-04-16 DIAGNOSIS — I359 Nonrheumatic aortic valve disorder, unspecified: Secondary | ICD-10-CM | POA: Diagnosis not present

## 2012-04-23 DIAGNOSIS — R0602 Shortness of breath: Secondary | ICD-10-CM | POA: Diagnosis not present

## 2012-05-07 DIAGNOSIS — I509 Heart failure, unspecified: Secondary | ICD-10-CM | POA: Diagnosis not present

## 2012-05-07 DIAGNOSIS — I2510 Atherosclerotic heart disease of native coronary artery without angina pectoris: Secondary | ICD-10-CM | POA: Diagnosis not present

## 2012-05-07 DIAGNOSIS — I359 Nonrheumatic aortic valve disorder, unspecified: Secondary | ICD-10-CM | POA: Diagnosis not present

## 2012-05-07 DIAGNOSIS — I059 Rheumatic mitral valve disease, unspecified: Secondary | ICD-10-CM | POA: Diagnosis not present

## 2012-05-07 DIAGNOSIS — I4891 Unspecified atrial fibrillation: Secondary | ICD-10-CM | POA: Diagnosis not present

## 2012-05-14 DIAGNOSIS — I359 Nonrheumatic aortic valve disorder, unspecified: Secondary | ICD-10-CM | POA: Diagnosis not present

## 2012-05-14 DIAGNOSIS — I059 Rheumatic mitral valve disease, unspecified: Secondary | ICD-10-CM | POA: Diagnosis not present

## 2012-05-14 DIAGNOSIS — I2510 Atherosclerotic heart disease of native coronary artery without angina pectoris: Secondary | ICD-10-CM | POA: Diagnosis not present

## 2012-05-14 DIAGNOSIS — I509 Heart failure, unspecified: Secondary | ICD-10-CM | POA: Diagnosis not present

## 2012-07-02 DIAGNOSIS — M722 Plantar fascial fibromatosis: Secondary | ICD-10-CM | POA: Diagnosis not present

## 2012-07-16 DIAGNOSIS — E7800 Pure hypercholesterolemia, unspecified: Secondary | ICD-10-CM | POA: Diagnosis not present

## 2012-07-23 DIAGNOSIS — Z8669 Personal history of other diseases of the nervous system and sense organs: Secondary | ICD-10-CM | POA: Diagnosis not present

## 2012-07-23 DIAGNOSIS — J3489 Other specified disorders of nose and nasal sinuses: Secondary | ICD-10-CM | POA: Diagnosis not present

## 2012-07-23 DIAGNOSIS — H6120 Impacted cerumen, unspecified ear: Secondary | ICD-10-CM | POA: Diagnosis not present

## 2012-07-23 DIAGNOSIS — J310 Chronic rhinitis: Secondary | ICD-10-CM | POA: Diagnosis not present

## 2012-07-30 DIAGNOSIS — M722 Plantar fascial fibromatosis: Secondary | ICD-10-CM | POA: Diagnosis not present

## 2012-09-03 DIAGNOSIS — I2510 Atherosclerotic heart disease of native coronary artery without angina pectoris: Secondary | ICD-10-CM | POA: Diagnosis not present

## 2012-09-10 DIAGNOSIS — I2510 Atherosclerotic heart disease of native coronary artery without angina pectoris: Secondary | ICD-10-CM | POA: Diagnosis not present

## 2012-09-10 DIAGNOSIS — I10 Essential (primary) hypertension: Secondary | ICD-10-CM | POA: Diagnosis not present

## 2012-09-10 DIAGNOSIS — I059 Rheumatic mitral valve disease, unspecified: Secondary | ICD-10-CM | POA: Diagnosis not present

## 2012-09-10 DIAGNOSIS — E7800 Pure hypercholesterolemia, unspecified: Secondary | ICD-10-CM | POA: Diagnosis not present

## 2012-09-10 DIAGNOSIS — I4891 Unspecified atrial fibrillation: Secondary | ICD-10-CM | POA: Diagnosis not present

## 2012-09-10 DIAGNOSIS — I359 Nonrheumatic aortic valve disorder, unspecified: Secondary | ICD-10-CM | POA: Diagnosis not present

## 2012-10-15 DIAGNOSIS — E039 Hypothyroidism, unspecified: Secondary | ICD-10-CM | POA: Diagnosis not present

## 2012-10-15 DIAGNOSIS — I359 Nonrheumatic aortic valve disorder, unspecified: Secondary | ICD-10-CM | POA: Diagnosis not present

## 2012-10-15 DIAGNOSIS — I059 Rheumatic mitral valve disease, unspecified: Secondary | ICD-10-CM | POA: Diagnosis not present

## 2012-10-15 DIAGNOSIS — G2589 Other specified extrapyramidal and movement disorders: Secondary | ICD-10-CM | POA: Diagnosis not present

## 2012-10-15 DIAGNOSIS — I10 Essential (primary) hypertension: Secondary | ICD-10-CM | POA: Diagnosis not present

## 2012-10-15 DIAGNOSIS — I2510 Atherosclerotic heart disease of native coronary artery without angina pectoris: Secondary | ICD-10-CM | POA: Diagnosis not present

## 2012-10-15 DIAGNOSIS — E7800 Pure hypercholesterolemia, unspecified: Secondary | ICD-10-CM | POA: Diagnosis not present

## 2012-10-15 DIAGNOSIS — I509 Heart failure, unspecified: Secondary | ICD-10-CM | POA: Diagnosis not present

## 2012-12-17 DIAGNOSIS — E039 Hypothyroidism, unspecified: Secondary | ICD-10-CM | POA: Diagnosis not present

## 2013-02-04 DIAGNOSIS — Z961 Presence of intraocular lens: Secondary | ICD-10-CM | POA: Diagnosis not present

## 2013-02-23 ENCOUNTER — Encounter: Payer: Self-pay | Admitting: Cardiology

## 2013-02-23 ENCOUNTER — Encounter: Payer: Self-pay | Admitting: *Deleted

## 2013-02-23 DIAGNOSIS — I2510 Atherosclerotic heart disease of native coronary artery without angina pectoris: Secondary | ICD-10-CM | POA: Insufficient documentation

## 2013-02-23 DIAGNOSIS — H7090 Unspecified mastoiditis, unspecified ear: Secondary | ICD-10-CM | POA: Insufficient documentation

## 2013-02-23 DIAGNOSIS — E039 Hypothyroidism, unspecified: Secondary | ICD-10-CM | POA: Insufficient documentation

## 2013-02-23 DIAGNOSIS — I509 Heart failure, unspecified: Secondary | ICD-10-CM | POA: Insufficient documentation

## 2013-02-23 DIAGNOSIS — M8580 Other specified disorders of bone density and structure, unspecified site: Secondary | ICD-10-CM | POA: Insufficient documentation

## 2013-02-23 DIAGNOSIS — I359 Nonrheumatic aortic valve disorder, unspecified: Secondary | ICD-10-CM | POA: Insufficient documentation

## 2013-02-23 DIAGNOSIS — I350 Nonrheumatic aortic (valve) stenosis: Secondary | ICD-10-CM | POA: Insufficient documentation

## 2013-02-23 DIAGNOSIS — E7800 Pure hypercholesterolemia, unspecified: Secondary | ICD-10-CM | POA: Insufficient documentation

## 2013-02-23 DIAGNOSIS — I4891 Unspecified atrial fibrillation: Secondary | ICD-10-CM | POA: Insufficient documentation

## 2013-02-23 DIAGNOSIS — J449 Chronic obstructive pulmonary disease, unspecified: Secondary | ICD-10-CM | POA: Insufficient documentation

## 2013-02-23 DIAGNOSIS — I059 Rheumatic mitral valve disease, unspecified: Secondary | ICD-10-CM | POA: Insufficient documentation

## 2013-02-23 DIAGNOSIS — G2581 Restless legs syndrome: Secondary | ICD-10-CM | POA: Insufficient documentation

## 2013-02-23 DIAGNOSIS — I110 Hypertensive heart disease with heart failure: Secondary | ICD-10-CM | POA: Insufficient documentation

## 2013-02-25 ENCOUNTER — Encounter: Payer: Self-pay | Admitting: Cardiology

## 2013-02-25 ENCOUNTER — Ambulatory Visit (INDEPENDENT_AMBULATORY_CARE_PROVIDER_SITE_OTHER): Payer: Medicare Other | Admitting: Cardiology

## 2013-02-25 VITALS — BP 168/89 | HR 80 | Ht 62.0 in | Wt 151.0 lb

## 2013-02-25 DIAGNOSIS — I10 Essential (primary) hypertension: Secondary | ICD-10-CM | POA: Diagnosis not present

## 2013-02-25 DIAGNOSIS — I359 Nonrheumatic aortic valve disorder, unspecified: Secondary | ICD-10-CM | POA: Diagnosis not present

## 2013-02-25 DIAGNOSIS — E7800 Pure hypercholesterolemia, unspecified: Secondary | ICD-10-CM | POA: Diagnosis not present

## 2013-02-25 DIAGNOSIS — I2510 Atherosclerotic heart disease of native coronary artery without angina pectoris: Secondary | ICD-10-CM

## 2013-02-25 DIAGNOSIS — I350 Nonrheumatic aortic (valve) stenosis: Secondary | ICD-10-CM

## 2013-02-25 NOTE — Patient Instructions (Signed)
Your physician recommends that you continue on your current medications as directed. Please refer to the Current Medication list given to you today.  Your physician wants you to follow-up in: 6 months with Dr. Skains, You will receive a reminder letter in the mail two months in advance. If you don't receive a letter, please call our office to schedule the follow-up appointment.  

## 2013-02-25 NOTE — Progress Notes (Signed)
[address] [place], [place]  [address] Phone: [phone] Fax:  [phone]  Date:  02/25/2013   ID:  Marissa [name], DOB 1933/03/31, MRN [id number]  PCP:  Thora Lance, MD   History of Present Illness: Marissa [name] is a 80 y.o. female with coronary artery disease status post bypass surgery with redo bypass surgery in January of 2013, COPD, aortic valve replacement in 2013 with 21 mm porcine valve, history of rheumatic fever with mitral valve replacement in 2013 with 27 mm porcine valve, history of atrial fibrillation, pulmonary hypertension, heart failure for follow up.   Surgery was performed in [facility]. In review of history and physical from January of 2013, she had moderate aortic regurgitation, moderate to severe pulmonary hypertension, right coronary artery was occluded, patent right coronary bypass graft with some irregularity, LIMA to LAD patent.   She had a chronic long-standing atrial fibrillation history but has been intolerant to anticoagulants. Coumadin very small doses have caused bleeding into her ear, she states. She's been reluctant to try anticoagulantion again because of this. She has never had a stroke. Her redo bypass involved a saphenous vein graft to the RCA and redo mitral valve replacement.  She's not having any shortness of breath, no chest pain, no strokelike symptoms, no orthopnea.    Wt Readings from Last 3 Encounters:  02/25/13 151 lb (68.493 kg)     Past Medical History  Diagnosis Date  . HTN (hypertension)   . Hypercholesteremia   . CAD (coronary artery disease)   . Aortic valve disorders   . RLS (restless legs syndrome)   . A-fib   . Osteopenia   . Mitral valve disease   . Hypothyroidism   . COPD (chronic obstructive pulmonary disease)   . CHF (congestive heart failure)   . Aortic stenosis     (s/p AVR w/ 21 mm porcine valve 03/2011  . Mastoiditis     Past Surgical History  Procedure  Laterality Date  . Coronary artery bypass graft    . Mitral valve replacement    . Laparoscopic cholecystectomy      Current Outpatient Prescriptions  Medication Sig Dispense Refill  . aspirin 81 MG tablet Take 81 mg by mouth daily.      [name] atorvastatin (LIPITOR) 40 MG tablet Take 40 mg by mouth daily.      . digoxin (LANOXIN) 0.125 MG tablet Take by mouth daily.      . furosemide (LASIX) 40 MG tablet Take 40 mg by mouth.      . ibandronate (BONIVA) 150 MG tablet Take 150 mg by mouth every 30 (thirty) days. Take in the morning with a full glass of water, on an empty stomach, and do not take anything else by mouth or lie down for the next 30 min.      [name] levothyroxine (SYNTHROID, LEVOTHROID) 100 MCG tablet Take 75 mcg by mouth daily before breakfast.       . metoprolol succinate (TOPROL-XL) 25 MG 24 hr tablet Take 25 mg by mouth daily.      . potassium chloride SA (K-DUR,KLOR-CON) 20 MEQ tablet Take 20 mEq by mouth once.       [name] telmisartan (MICARDIS) 40 MG tablet Take 40 mg by mouth daily.      [name] tiotropium (SPIRIVA HANDIHALER) 18 MCG inhalation capsule Place 18 mcg into inhaler and inhale daily.       No current facility-administered medications for this  visit.    Allergies:   Not on File  Social History:  The patient  reports that she has quit smoking. She does not have any smokeless tobacco history on file. her son died in 2011/10/04.  ROS:  Please see the history of present illness.   Denies any bleeding, syncope, orthopnea, PND  PHYSICAL EXAM: VS:  BP 168/89  Pulse 80  Ht 5\' 2"  (1.575 m)  Wt 151 lb (68.493 kg)  BMI 27.61 kg/m2 Well nourished, well developed, in no acute distress HEENT: normal Neck: no JVD Cardiac:  Irregularly irregular; soft systolic murmur Lungs:  clear to auscultation bilaterally, no wheezing, rhonchi or rales Abd: soft, nontender, no hepatomegaly Ext: no edema Skin: warm and dry Neuro: no focal abnormalities noted  EKG: 02/25/13- Atrial  fibrillation, heart rate 80, nonspecific ST changes     Echocardiogram: 05/16/12: 1. There is mild concentric left ventricle hypertrophy.  2. Left ventricular ejection fraction estimated by 2D at 60-65 percent.  3. There were no regional wall motion abnormalities.  4. Severe left atrial enlargement.  5. There is a normal functioning bioprosthetic mitral valve.  6. The gradient across the prosthetic mitral valve is within normal limits.  7. There is no valvular or perivalvular regurgitation.  8. There is mild to moderate tricuspid regurgitation.  9. Moderately elevated estimated right ventricular systolic pressure.  10. There is a bioprosthetic aortic valve.  11. The prosthetic aortic valve appears to be functioning normally.  12. The gradient across the prosthetic aortic valve is within normal limits.  13. Doppler suggestive of elevated left atrial pressure.   ASSESSMENT AND PLAN:  1. Coronary artery disease-status post bypass. No active anginal symptoms. Doing well. Prior bypass anatomy reviewed. 2. Atrial fibrillation- lengthy discussions about anticoagulation. She does not wish to restart Coumadin because of previous inner ear bleeding, mastoid. She adamantly does not wish to be placed back on anticoagulation. She understands the risk of stroke. Dilated left atrium noted. Previous history of rheumatic heart disease. 3. Aortic valve replacement-previous aortic stenosis.  Signed, Donato Schultz, MD [facility]  02/25/2013 12:37 PM

## 2013-03-04 DIAGNOSIS — E039 Hypothyroidism, unspecified: Secondary | ICD-10-CM | POA: Diagnosis not present

## 2013-03-27 ENCOUNTER — Ambulatory Visit: Payer: Medicare Other | Admitting: Radiation Oncology

## 2013-04-22 DIAGNOSIS — E039 Hypothyroidism, unspecified: Secondary | ICD-10-CM | POA: Diagnosis not present

## 2013-04-22 DIAGNOSIS — I10 Essential (primary) hypertension: Secondary | ICD-10-CM | POA: Diagnosis not present

## 2013-04-22 DIAGNOSIS — E7800 Pure hypercholesterolemia, unspecified: Secondary | ICD-10-CM | POA: Diagnosis not present

## 2013-04-22 DIAGNOSIS — I059 Rheumatic mitral valve disease, unspecified: Secondary | ICD-10-CM | POA: Diagnosis not present

## 2013-04-22 DIAGNOSIS — I509 Heart failure, unspecified: Secondary | ICD-10-CM | POA: Diagnosis not present

## 2013-04-22 DIAGNOSIS — I2510 Atherosclerotic heart disease of native coronary artery without angina pectoris: Secondary | ICD-10-CM | POA: Diagnosis not present

## 2013-04-22 DIAGNOSIS — I359 Nonrheumatic aortic valve disorder, unspecified: Secondary | ICD-10-CM | POA: Diagnosis not present

## 2013-04-22 DIAGNOSIS — Z1331 Encounter for screening for depression: Secondary | ICD-10-CM | POA: Diagnosis not present

## 2013-05-06 DIAGNOSIS — Z9889 Other specified postprocedural states: Secondary | ICD-10-CM | POA: Diagnosis not present

## 2013-05-06 DIAGNOSIS — H6120 Impacted cerumen, unspecified ear: Secondary | ICD-10-CM | POA: Diagnosis not present

## 2013-05-06 DIAGNOSIS — R7989 Other specified abnormal findings of blood chemistry: Secondary | ICD-10-CM | POA: Diagnosis not present

## 2013-09-09 ENCOUNTER — Encounter: Payer: Self-pay | Admitting: Cardiology

## 2013-09-09 ENCOUNTER — Ambulatory Visit (INDEPENDENT_AMBULATORY_CARE_PROVIDER_SITE_OTHER): Payer: Medicare Other | Admitting: Cardiology

## 2013-09-09 VITALS — BP 141/75 | HR 81 | Ht 62.0 in | Wt 153.0 lb

## 2013-09-09 DIAGNOSIS — I350 Nonrheumatic aortic (valve) stenosis: Secondary | ICD-10-CM

## 2013-09-09 DIAGNOSIS — I482 Chronic atrial fibrillation, unspecified: Secondary | ICD-10-CM

## 2013-09-09 DIAGNOSIS — J449 Chronic obstructive pulmonary disease, unspecified: Secondary | ICD-10-CM | POA: Diagnosis not present

## 2013-09-09 DIAGNOSIS — I2510 Atherosclerotic heart disease of native coronary artery without angina pectoris: Secondary | ICD-10-CM

## 2013-09-09 DIAGNOSIS — I209 Angina pectoris, unspecified: Secondary | ICD-10-CM | POA: Diagnosis not present

## 2013-09-09 DIAGNOSIS — I25119 Atherosclerotic heart disease of native coronary artery with unspecified angina pectoris: Secondary | ICD-10-CM

## 2013-09-09 DIAGNOSIS — I4891 Unspecified atrial fibrillation: Secondary | ICD-10-CM

## 2013-09-09 DIAGNOSIS — I359 Nonrheumatic aortic valve disorder, unspecified: Secondary | ICD-10-CM

## 2013-09-09 DIAGNOSIS — I10 Essential (primary) hypertension: Secondary | ICD-10-CM

## 2013-09-09 NOTE — Patient Instructions (Signed)
Your physician recommends that you continue on your current medications as directed. Please refer to the Current Medication list given to you today.  Your physician wants you to follow-up in: Marissa [name] will receive a reminder letter in the mail two months in advance. If you don't receive a letter, please call our office to schedule the follow-up appointment.

## 2013-09-09 NOTE — Progress Notes (Addendum)
[place], [address], [place]  [address] Phone: [phone] Fax:  [phone]  Date:  09/09/2013   ID:  Marissa [name], DOB 1933/09/13, MRN [id number]  PCP:  [name], MD   History of Present Illness: Marissa [name] is a 80 y.o. female with coronary artery disease status post bypass surgery with redo bypass surgery in January of 2013, COPD, aortic valve replacement in 2013 with 21 mm porcine valve, history of rheumatic fever with mitral valve replacement in 2013 with 27 mm porcine valve, history of atrial fibrillation, pulmonary hypertension, heart failure for follow up.   Surgery was performed in [facility]. In review of history and physical from January of 2013, she had moderate aortic regurgitation, moderate to severe pulmonary hypertension, right coronary artery was occluded, patent right coronary bypass graft with some irregularity, LIMA to LAD patent.   She had a chronic long-standing atrial fibrillation history but has been intolerant to anticoagulants. Coumadin very small doses have caused bleeding into her ear, she states. She's been reluctant to try anticoagulantion again because of this. She has never had a stroke. Her redo bypass involved a saphenous vein graft to the RCA and redo mitral valve replacement.  She's not having any shortness of breath, no chest pain, no strokelike symptoms, no orthopnea. She did state however that she's been having some numbness sensation in her bilateral feet, not exacerbated by motion, constant (peripheral neuropathy like).    Wt Readings from Last 3 Encounters:  09/09/13 153 lb (69.4 kg)  02/25/13 151 lb (68.493 kg)     Past Medical History  Diagnosis Date  . HTN (hypertension)   . Hypercholesteremia   . CAD (coronary artery disease)   . Aortic valve disorders   . RLS (restless legs syndrome)   . A-fib   . Osteopenia   . Mitral valve disease   . Hypothyroidism   . COPD (chronic  obstructive pulmonary disease)   . CHF (congestive heart failure)   . Aortic stenosis     (s/p AVR w/ 21 mm porcine valve 03/2011  . Mastoiditis     Past Surgical History  Procedure Laterality Date  . Coronary artery bypass graft    . Mitral valve replacement    . Laparoscopic cholecystectomy      Current Outpatient Prescriptions  Medication Sig Dispense Refill  . aspirin 81 MG tablet Take 81 mg by mouth daily.      [name] atorvastatin (LIPITOR) 40 MG tablet Take 20 mg by mouth daily.       . digoxin (LANOXIN) 0.125 MG tablet Take by mouth daily.      [name] esomeprazole (NEXIUM) 40 MG capsule Take 40 mg by mouth daily at 12 noon.      . furosemide (LASIX) 40 MG tablet Take 40 mg by mouth.      . ibandronate (BONIVA) 150 MG tablet Take 150 mg by mouth every 30 (thirty) days. Take in the morning with a full glass of water, on an empty stomach, and do not take anything else by mouth or lie down for the next 30 min.      [name] levothyroxine (SYNTHROID, LEVOTHROID) 100 MCG tablet Take 75 mcg by mouth daily before breakfast.       . metoprolol succinate (TOPROL-XL) 25 MG 24 hr tablet Take 25 mg by mouth daily.      . potassium chloride SA (K-DUR,KLOR-CON) 20 MEQ tablet Take 20 mEq by mouth once.       [name]  telmisartan (MICARDIS) 40 MG tablet Take 40 mg by mouth daily.      [name] tiotropium (SPIRIVA HANDIHALER) 18 MCG inhalation capsule Place 18 mcg into inhaler and inhale daily.       No current facility-administered medications for this visit.    Allergies:   Not on File  Social History:  The patient  reports that she has quit smoking. She does not have any smokeless tobacco history on file. her son died in 10-08-2011.  ROS:  Please see the history of present illness.   Denies any bleeding, syncope, orthopnea, PND  PHYSICAL EXAM: VS:  BP 141/75  Pulse 81  Ht 5\' 2"  (1.575 m)  Wt 153 lb (69.4 kg)  BMI 27.98 kg/m2 Well nourished, well developed, in no acute distress HEENT: normal Neck: no  JVD Cardiac:  Irregularly irregular; soft systolic murmur Lungs:  clear to auscultation bilaterally, no wheezing, rhonchi or rales Abd: soft, nontender, no hepatomegaly Ext: no edema Skin: warm and dry Neuro: no focal abnormalities noted  EKG: 02/25/13- Atrial fibrillation, heart rate 80, nonspecific ST changes   09/09/13-atrial fibrillation, 77, borderline right bundle branch block.   Echocardiogram: 05/16/12: 1. There is mild concentric left ventricle hypertrophy.  2. Left ventricular ejection fraction estimated by 2D at 60-65 percent.  3. There were no regional wall motion abnormalities.  4. Severe left atrial enlargement.  5. There is a normal functioning bioprosthetic mitral valve.  6. The gradient across the prosthetic mitral valve is within normal limits.  7. There is no valvular or perivalvular regurgitation.  8. There is mild to moderate tricuspid regurgitation.  9. Moderately elevated estimated right ventricular systolic pressure.  10. There is a bioprosthetic aortic valve.  11. The prosthetic aortic valve appears to be functioning normally.  12. The gradient across the prosthetic aortic valve is within normal limits.  13. Doppler suggestive of elevated left atrial pressure.   ASSESSMENT AND PLAN:  1. Coronary artery disease-status post bypass. No active anginal symptoms. Doing well. Prior bypass anatomy reviewed. 2. Atrial fibrillation- previous lengthy discussions about anticoagulation. She does not wish to restart Coumadin because of previous inner ear bleeding, mastoid. She adamantly does not wish to be placed back on anticoagulation. She understands the risk of stroke. Dilated left atrium noted. Previous history of rheumatic heart disease. 3. Aortic valve replacement-previous aortic stenosis. 4. Mitral valve replacement as well. 5. Peripheral neuropathy-asked her to discuss this further with her primary physician, Dr. Marisue Humble. She has excellent dorsalis pedis pulse.    Signed, Candee Furbish, MD [facility]  09/09/2013 9:16 AM

## 2013-09-30 DIAGNOSIS — H6120 Impacted cerumen, unspecified ear: Secondary | ICD-10-CM | POA: Diagnosis not present

## 2013-09-30 DIAGNOSIS — Z9889 Other specified postprocedural states: Secondary | ICD-10-CM | POA: Diagnosis not present

## 2013-10-28 ENCOUNTER — Other Ambulatory Visit: Payer: Self-pay | Admitting: Family Medicine

## 2013-10-28 DIAGNOSIS — Z1231 Encounter for screening mammogram for malignant neoplasm of breast: Secondary | ICD-10-CM

## 2013-10-28 DIAGNOSIS — R7309 Other abnormal glucose: Secondary | ICD-10-CM | POA: Diagnosis not present

## 2013-10-28 DIAGNOSIS — I359 Nonrheumatic aortic valve disorder, unspecified: Secondary | ICD-10-CM | POA: Diagnosis not present

## 2013-10-28 DIAGNOSIS — I2510 Atherosclerotic heart disease of native coronary artery without angina pectoris: Secondary | ICD-10-CM | POA: Diagnosis not present

## 2013-10-28 DIAGNOSIS — I059 Rheumatic mitral valve disease, unspecified: Secondary | ICD-10-CM | POA: Diagnosis not present

## 2013-10-28 DIAGNOSIS — E7800 Pure hypercholesterolemia, unspecified: Secondary | ICD-10-CM | POA: Diagnosis not present

## 2013-10-28 DIAGNOSIS — E039 Hypothyroidism, unspecified: Secondary | ICD-10-CM | POA: Diagnosis not present

## 2013-10-28 DIAGNOSIS — I10 Essential (primary) hypertension: Secondary | ICD-10-CM | POA: Diagnosis not present

## 2013-10-28 DIAGNOSIS — I509 Heart failure, unspecified: Secondary | ICD-10-CM | POA: Diagnosis not present

## 2013-11-03 ENCOUNTER — Inpatient Hospital Stay: Payer: Self-pay | Admitting: Internal Medicine

## 2013-11-03 DIAGNOSIS — Z951 Presence of aortocoronary bypass graft: Secondary | ICD-10-CM | POA: Diagnosis not present

## 2013-11-03 DIAGNOSIS — I2789 Other specified pulmonary heart diseases: Secondary | ICD-10-CM | POA: Diagnosis not present

## 2013-11-03 DIAGNOSIS — I517 Cardiomegaly: Secondary | ICD-10-CM | POA: Diagnosis not present

## 2013-11-03 DIAGNOSIS — I4891 Unspecified atrial fibrillation: Secondary | ICD-10-CM | POA: Diagnosis not present

## 2013-11-03 DIAGNOSIS — I5031 Acute diastolic (congestive) heart failure: Secondary | ICD-10-CM | POA: Diagnosis not present

## 2013-11-03 DIAGNOSIS — R7309 Other abnormal glucose: Secondary | ICD-10-CM | POA: Diagnosis present

## 2013-11-03 DIAGNOSIS — J449 Chronic obstructive pulmonary disease, unspecified: Secondary | ICD-10-CM | POA: Diagnosis not present

## 2013-11-03 DIAGNOSIS — E039 Hypothyroidism, unspecified: Secondary | ICD-10-CM | POA: Diagnosis present

## 2013-11-03 DIAGNOSIS — R911 Solitary pulmonary nodule: Secondary | ICD-10-CM | POA: Diagnosis not present

## 2013-11-03 DIAGNOSIS — I359 Nonrheumatic aortic valve disorder, unspecified: Secondary | ICD-10-CM | POA: Diagnosis not present

## 2013-11-03 DIAGNOSIS — I2510 Atherosclerotic heart disease of native coronary artery without angina pectoris: Secondary | ICD-10-CM | POA: Diagnosis present

## 2013-11-03 DIAGNOSIS — I252 Old myocardial infarction: Secondary | ICD-10-CM | POA: Diagnosis not present

## 2013-11-03 DIAGNOSIS — I5033 Acute on chronic diastolic (congestive) heart failure: Secondary | ICD-10-CM | POA: Diagnosis not present

## 2013-11-03 DIAGNOSIS — Z954 Presence of other heart-valve replacement: Secondary | ICD-10-CM | POA: Diagnosis not present

## 2013-11-03 DIAGNOSIS — J984 Other disorders of lung: Secondary | ICD-10-CM | POA: Diagnosis not present

## 2013-11-03 DIAGNOSIS — I509 Heart failure, unspecified: Secondary | ICD-10-CM | POA: Diagnosis present

## 2013-11-03 DIAGNOSIS — I099 Rheumatic heart disease, unspecified: Secondary | ICD-10-CM | POA: Diagnosis present

## 2013-11-03 DIAGNOSIS — I10 Essential (primary) hypertension: Secondary | ICD-10-CM | POA: Diagnosis not present

## 2013-11-03 DIAGNOSIS — R0602 Shortness of breath: Secondary | ICD-10-CM | POA: Diagnosis not present

## 2013-11-03 LAB — CBC
HCT: 41.5 % (ref 35.0–47.0)
HGB: 13.4 g/dL (ref 12.0–16.0)
MCH: 28.6 pg (ref 26.0–34.0)
MCHC: 32.3 g/dL (ref 32.0–36.0)
MCV: 89 fL (ref 80–100)
PLATELETS: 194 10*3/uL (ref 150–440)
RBC: 4.68 10*6/uL (ref 3.80–5.20)
RDW: 15.4 % — AB (ref 11.5–14.5)
WBC: 11.1 10*3/uL — ABNORMAL HIGH (ref 3.6–11.0)

## 2013-11-03 LAB — BASIC METABOLIC PANEL
Anion Gap: 10 (ref 7–16)
BUN: 10 mg/dL (ref 7–18)
CHLORIDE: 106 mmol/L (ref 98–107)
CO2: 27 mmol/L (ref 21–32)
CREATININE: 0.78 mg/dL (ref 0.60–1.30)
Calcium, Total: 8.9 mg/dL (ref 8.5–10.1)
EGFR (African American): >60
Glucose: 96 mg/dL (ref 65–99)
OSMOLALITY: 284 (ref 275–301)
Potassium: 3.6 mmol/L (ref 3.5–5.1)
Sodium: 143 mmol/L (ref 136–145)

## 2013-11-03 LAB — PRO B NATRIURETIC PEPTIDE: B-TYPE NATIURETIC PEPTID: 1245 pg/mL — AB (ref 0–450)

## 2013-11-03 LAB — TSH: THYROID STIMULATING HORM: 0.204 u[IU]/mL — AB

## 2013-11-03 LAB — APTT: Activated PTT: 33.6 secs (ref 23.6–35.9)

## 2013-11-03 LAB — T4, FREE: Free Thyroxine: 1.33 ng/dL (ref 0.76–1.46)

## 2013-11-03 LAB — TROPONIN I
Troponin-I: <0.02 ng/mL
Troponin-I: <0.02 ng/mL

## 2013-11-03 LAB — CK TOTAL AND CKMB (NOT AT ARMC)
CK, Total: 93 U/L
CK-MB: 1.3 ng/mL (ref 0.5–3.6)

## 2013-11-03 LAB — PROTIME-INR
INR: 1
Prothrombin Time: 13.2 secs (ref 11.5–14.7)

## 2013-11-03 LAB — DIGOXIN LEVEL: Digoxin: 0.67 ng/mL

## 2013-11-04 DIAGNOSIS — I509 Heart failure, unspecified: Secondary | ICD-10-CM

## 2013-11-04 DIAGNOSIS — I4891 Unspecified atrial fibrillation: Secondary | ICD-10-CM

## 2013-11-04 DIAGNOSIS — I10 Essential (primary) hypertension: Secondary | ICD-10-CM

## 2013-11-04 DIAGNOSIS — I5031 Acute diastolic (congestive) heart failure: Secondary | ICD-10-CM

## 2013-11-04 DIAGNOSIS — I359 Nonrheumatic aortic valve disorder, unspecified: Secondary | ICD-10-CM

## 2013-11-04 LAB — MAGNESIUM: Magnesium: 1.8 mg/dL

## 2013-11-04 LAB — CBC WITH DIFFERENTIAL/PLATELET
Basophil #: 0.2 10*3/uL — ABNORMAL HIGH (ref 0.0–0.1)
Basophil %: 1.2 %
Eosinophil #: 0.1 10*3/uL (ref 0.0–0.7)
Eosinophil %: 0.6 %
HCT: 41.7 % (ref 35.0–47.0)
HGB: 13.4 g/dL (ref 12.0–16.0)
Lymphocyte #: 2.1 10*3/uL (ref 1.0–3.6)
Lymphocyte %: 16.2 %
MCH: 28.7 pg (ref 26.0–34.0)
MCHC: 32.2 g/dL (ref 32.0–36.0)
MCV: 89 fL (ref 80–100)
MONO ABS: 1 x10 3/mm — AB (ref 0.2–0.9)
Monocyte %: 7.8 %
NEUTROS ABS: 9.7 10*3/uL — AB (ref 1.4–6.5)
Neutrophil %: 74.2 %
PLATELETS: 205 10*3/uL (ref 150–440)
RBC: 4.67 10*6/uL (ref 3.80–5.20)
RDW: 15.6 % — ABNORMAL HIGH (ref 11.5–14.5)
WBC: 13 10*3/uL — AB (ref 3.6–11.0)

## 2013-11-04 LAB — BASIC METABOLIC PANEL
Anion Gap: 7 (ref 7–16)
BUN: 9 mg/dL (ref 7–18)
CHLORIDE: 103 mmol/L (ref 98–107)
Calcium, Total: 8.6 mg/dL (ref 8.5–10.1)
Co2: 26 mmol/L (ref 21–32)
Creatinine: 0.7 mg/dL (ref 0.60–1.30)
EGFR (Non-African Amer.): >60
Glucose: 143 mg/dL — ABNORMAL HIGH (ref 65–99)
Osmolality: 273 (ref 275–301)
Potassium: 3.7 mmol/L (ref 3.5–5.1)
Sodium: 136 mmol/L (ref 136–145)

## 2013-11-04 LAB — PRO B NATRIURETIC PEPTIDE: B-Type Natriuretic Peptide: 1883 pg/mL — ABNORMAL HIGH (ref 0–450)

## 2013-11-04 LAB — LIPID PANEL
Cholesterol: 81 mg/dL (ref 0–200)
HDL Cholesterol: 28 mg/dL — ABNORMAL LOW (ref 40–60)
Ldl Cholesterol, Calc: 15 mg/dL (ref 0–100)
Triglycerides: 188 mg/dL (ref 0–200)
VLDL Cholesterol, Calc: 38 mg/dL (ref 5–40)

## 2013-11-04 LAB — HEPARIN LEVEL (UNFRACTIONATED): ANTI-XA(UNFRACTIONATED): 0.34 [IU]/mL (ref 0.30–0.70)

## 2013-11-04 LAB — HEMOGLOBIN A1C: Hemoglobin A1C: 6 % (ref 4.2–6.3)

## 2013-11-04 LAB — TROPONIN I: Troponin-I: <0.02 ng/mL

## 2013-11-05 DIAGNOSIS — I4891 Unspecified atrial fibrillation: Secondary | ICD-10-CM

## 2013-11-05 DIAGNOSIS — I2510 Atherosclerotic heart disease of native coronary artery without angina pectoris: Secondary | ICD-10-CM

## 2013-11-05 LAB — BASIC METABOLIC PANEL
ANION GAP: 15 (ref 7–16)
BUN: 21 mg/dL — ABNORMAL HIGH (ref 7–18)
CREATININE: 0.97 mg/dL (ref 0.60–1.30)
Calcium, Total: 8.8 mg/dL (ref 8.5–10.1)
Chloride: 102 mmol/L (ref 98–107)
Co2: 23 mmol/L (ref 21–32)
EGFR (African American): >60
EGFR (Non-African Amer.): 56 — ABNORMAL LOW
Glucose: 104 mg/dL — ABNORMAL HIGH (ref 65–99)
Osmolality: 283 (ref 275–301)
POTASSIUM: 3.9 mmol/L (ref 3.5–5.1)
Sodium: 140 mmol/L (ref 136–145)

## 2013-11-05 LAB — CBC WITH DIFFERENTIAL/PLATELET
BASOS ABS: 0.1 10*3/uL (ref 0.0–0.1)
BASOS PCT: 0.6 %
EOS PCT: 1.9 %
Eosinophil #: 0.2 10*3/uL (ref 0.0–0.7)
HCT: 41.5 % (ref 35.0–47.0)
HGB: 13.5 g/dL (ref 12.0–16.0)
Lymphocyte #: 2 10*3/uL (ref 1.0–3.6)
Lymphocyte %: 19.1 %
MCH: 28.7 pg (ref 26.0–34.0)
MCHC: 32.6 g/dL (ref 32.0–36.0)
MCV: 88 fL (ref 80–100)
MONO ABS: 1.2 x10 3/mm — AB (ref 0.2–0.9)
Monocyte %: 11.5 %
NEUTROS ABS: 7 10*3/uL — AB (ref 1.4–6.5)
Neutrophil %: 66.9 %
PLATELETS: 190 10*3/uL (ref 150–440)
RBC: 4.71 10*6/uL (ref 3.80–5.20)
RDW: 15.7 % — AB (ref 11.5–14.5)
WBC: 10.5 10*3/uL (ref 3.6–11.0)

## 2013-11-06 DIAGNOSIS — I509 Heart failure, unspecified: Secondary | ICD-10-CM

## 2013-11-06 LAB — BASIC METABOLIC PANEL
ANION GAP: 13 (ref 7–16)
Anion Gap: 10 (ref 7–16)
BUN: 28 mg/dL — AB (ref 7–18)
BUN: 31 mg/dL — ABNORMAL HIGH (ref 7–18)
CREATININE: 1.14 mg/dL (ref 0.60–1.30)
Calcium, Total: 8.8 mg/dL (ref 8.5–10.1)
Calcium, Total: 8.9 mg/dL (ref 8.5–10.1)
Chloride: 101 mmol/L (ref 98–107)
Chloride: 101 mmol/L (ref 98–107)
Co2: 28 mmol/L (ref 21–32)
Co2: 29 mmol/L (ref 21–32)
Creatinine: 1.36 mg/dL — ABNORMAL HIGH (ref 0.60–1.30)
EGFR (Non-African Amer.): 37 — ABNORMAL LOW
GFR CALC AF AMER: 53 — AB
GFR CALC AF AMER: 43 — AB
GFR CALC NON AF AMER: 46 — AB
GLUCOSE: 103 mg/dL — AB (ref 65–99)
Glucose: 109 mg/dL — ABNORMAL HIGH (ref 65–99)
Osmolality: 289 (ref 275–301)
Osmolality: 287 (ref 275–301)
POTASSIUM: 3.1 mmol/L — AB (ref 3.5–5.1)
Potassium: 3.9 mmol/L (ref 3.5–5.1)
SODIUM: 142 mmol/L (ref 136–145)
SODIUM: 140 mmol/L (ref 136–145)

## 2013-11-06 LAB — CBC WITH DIFFERENTIAL/PLATELET
BASOS PCT: 0.7 %
Basophil #: 0.1 10*3/uL (ref 0.0–0.1)
EOS PCT: 2.6 %
Eosinophil #: 0.3 10*3/uL (ref 0.0–0.7)
HCT: 43.5 % (ref 35.0–47.0)
HGB: 14.3 g/dL (ref 12.0–16.0)
LYMPHS ABS: 2.2 10*3/uL (ref 1.0–3.6)
LYMPHS PCT: 18.1 %
MCH: 29.2 pg (ref 26.0–34.0)
MCHC: 33 g/dL (ref 32.0–36.0)
MCV: 88 fL (ref 80–100)
Monocyte #: 1.3 x10 3/mm — ABNORMAL HIGH (ref 0.2–0.9)
Monocyte %: 10.9 %
Neutrophil #: 8.3 10*3/uL — ABNORMAL HIGH (ref 1.4–6.5)
Neutrophil %: 67.7 %
PLATELETS: 189 10*3/uL (ref 150–440)
RBC: 4.91 10*6/uL (ref 3.80–5.20)
RDW: 15.1 % — ABNORMAL HIGH (ref 11.5–14.5)
WBC: 12.3 10*3/uL — ABNORMAL HIGH (ref 3.6–11.0)

## 2013-11-18 ENCOUNTER — Other Ambulatory Visit: Payer: Self-pay | Admitting: Family Medicine

## 2013-11-18 ENCOUNTER — Ambulatory Visit: Payer: Self-pay

## 2013-11-18 DIAGNOSIS — I509 Heart failure, unspecified: Secondary | ICD-10-CM | POA: Diagnosis not present

## 2013-11-18 DIAGNOSIS — I110 Hypertensive heart disease with heart failure: Secondary | ICD-10-CM | POA: Diagnosis not present

## 2013-11-18 DIAGNOSIS — I4891 Unspecified atrial fibrillation: Secondary | ICD-10-CM | POA: Diagnosis not present

## 2013-11-18 DIAGNOSIS — J449 Chronic obstructive pulmonary disease, unspecified: Secondary | ICD-10-CM | POA: Diagnosis not present

## 2013-11-18 DIAGNOSIS — R911 Solitary pulmonary nodule: Secondary | ICD-10-CM | POA: Diagnosis not present

## 2013-11-28 ENCOUNTER — Encounter: Payer: Self-pay | Admitting: Cardiology

## 2013-12-02 ENCOUNTER — Ambulatory Visit (INDEPENDENT_AMBULATORY_CARE_PROVIDER_SITE_OTHER): Payer: Medicare Other | Admitting: Physician Assistant

## 2013-12-02 ENCOUNTER — Encounter: Payer: Self-pay | Admitting: Physician Assistant

## 2013-12-02 ENCOUNTER — Ambulatory Visit
Admission: RE | Admit: 2013-12-02 | Discharge: 2013-12-02 | Disposition: A | Discharge status: Home / Self Care | Payer: Medicare Other | Source: Ambulatory Visit | Attending: Family Medicine | Admitting: Family Medicine

## 2013-12-02 VITALS — BP 131/80 | HR 64 | Ht 62.0 in | Wt 145.0 lb

## 2013-12-02 DIAGNOSIS — I5032 Chronic diastolic (congestive) heart failure: Secondary | ICD-10-CM | POA: Diagnosis not present

## 2013-12-02 DIAGNOSIS — I38 Endocarditis, valve unspecified: Secondary | ICD-10-CM | POA: Diagnosis not present

## 2013-12-02 DIAGNOSIS — J449 Chronic obstructive pulmonary disease, unspecified: Secondary | ICD-10-CM

## 2013-12-02 DIAGNOSIS — J984 Other disorders of lung: Secondary | ICD-10-CM | POA: Diagnosis not present

## 2013-12-02 DIAGNOSIS — R911 Solitary pulmonary nodule: Secondary | ICD-10-CM | POA: Diagnosis not present

## 2013-12-02 DIAGNOSIS — J438 Other emphysema: Secondary | ICD-10-CM | POA: Diagnosis not present

## 2013-12-02 DIAGNOSIS — I4891 Unspecified atrial fibrillation: Secondary | ICD-10-CM

## 2013-12-02 DIAGNOSIS — I2510 Atherosclerotic heart disease of native coronary artery without angina pectoris: Secondary | ICD-10-CM | POA: Diagnosis not present

## 2013-12-02 DIAGNOSIS — I10 Essential (primary) hypertension: Secondary | ICD-10-CM

## 2013-12-02 DIAGNOSIS — N289 Disorder of kidney and ureter, unspecified: Secondary | ICD-10-CM | POA: Diagnosis not present

## 2013-12-02 DIAGNOSIS — I482 Chronic atrial fibrillation, unspecified: Secondary | ICD-10-CM

## 2013-12-02 DIAGNOSIS — E785 Hyperlipidemia, unspecified: Secondary | ICD-10-CM

## 2013-12-02 DIAGNOSIS — Z954 Presence of other heart-valve replacement: Secondary | ICD-10-CM | POA: Diagnosis not present

## 2013-12-02 MED ORDER — CARVEDILOL 6.25 MG PO TABS: 6.2500 mg | ORAL_TABLET | Freq: Two times a day (BID) | ORAL | Status: DC

## 2013-12-02 NOTE — Progress Notes (Signed)
Cardiology Office Note    Date:  12/02/2013   ID:  Drina Jobst, DOB 1933/09/07, MRN [id number]  PCP:  [name], MD  Cardiologist:  Dr. Candee Furbish     History of Present Illness: Marissa [name] is a 80 y.o. female with a hx of CAD s/p CABG with redo CABG + AVR (bioprosthetic) in 03/2011 [place], rheumatic heart disease s/p bioprosthetic MVR in 2013, COPD, AFib, Diastolic CHF, pulmonary HTN.  Patient has been intolerant to anticoagulation.  Last seen by Dr. Candee Furbish in 08/2013.  She was recently admitted to [facility] 6/78-9/38 with a/c diastolic CHF in the setting of labile HTN.  She was diuresed with IV Lasix.  She was seen by cardiology.  Echo demonstrated normal LVF and well functioning MV and AV prostheses.  Elevated BP was thought to be contributing to volume excess.  BP was well controlled at DC.  Discussions regarding anticoagulation were held with the patient and Eliquis was started.  CEs remained normal.  Pulmonary nodule was noted on CXR.  CT was scheduled as an OP with results as noted below.  Patient FU with PCP last week.  Micardis was DC'd 2/2 low BP and Lasix was decreased to 40 mg in A and 20 mg in P.    She returns for FU.  She is here by herself.  She denies chest pain.  She has chronic dyspnea.  She is NYHA 3.  Breathing is improved since her CHF exacerbation.  She denies orthopnea, PND, edema.  She denies syncope.     Studies:  - Echo (04/2012):  Mild LVH, EF 60-65%, severe LAE, MVR ok, mild to mod TR, mod elevated RVSP, AVR ok (mean 14 mmHg)  - Echo (8/15 - done at [facility]):  EF 55-60%, mod LVH, restrictive pattern, mod LAE, mild RAE, MVR ok with mild MS (mean 5 mmHg), AVR ok with mild AS (mean 16 mmHg), mod elevated PASP, mild TR  Recent Labs/Images: No results found for requested labs within last 365 days. 11/18/2013:  K 4.3, Creatinine 1.26, BUN 47  Ct Chest Wo Contrast  12/02/2013     IMPRESSION: 1. Marginally spiculated 1.6 x 1.4 cm right upper lobe  pulmonary nodule, persistent over the past month. Appearance highly concerning for bronchogenic carcinoma. Tissue diagnosis and/or nuclear medicine PET-CT recommended. 2. Triangular 3.5 x 1.9 cm left lower lobe airspace opacity with associated volume loss and calcification, probably from scarring or chronic atelectasis, less likely to harbor malignancy. This could also be assessed further at PET-CT. 3. Bandlike scarring in the right lower lobe. 4. Centrilobular emphysema. 5. Atherosclerosis and enlarged left atrium. Prior valve prostheses and CABG. 6. 3 mm subpleural nodule in the left upper lobe, is probably benign but merit observation on followup studies. These results will be called to the ordering clinician or representative by the Radiologist Assistant, and communication documented in the PACS or zVision Dashboard.   Electronically Signed   By: Sherryl Barters M.D.   On: 12/02/2013 10:12     Wt Readings from Last 3 Encounters:  12/02/13 145 lb (65.772 kg)  09/09/13 153 lb (69.4 kg)  02/25/13 151 lb (68.493 kg)     Past Medical History  Diagnosis Date  . HTN (hypertension)   . Hypercholesteremia   . CAD (coronary artery disease)   . Aortic valve disorders   . RLS (restless legs syndrome)   . A-fib   . Osteopenia   . Mitral valve disease   .  Hypothyroidism   . COPD (chronic obstructive pulmonary disease)   . CHF (congestive heart failure)   . Aortic stenosis     (s/p AVR w/ 21 mm porcine valve 03/2011  . Mastoiditis     Current Outpatient Prescriptions  Medication Sig Dispense Refill  . apixaban (ELIQUIS) 2.5 MG TABS tablet Take 2.5 mg by mouth 2 (two) times daily.      [name] atorvastatin (LIPITOR) 40 MG tablet Take 40 mg by mouth daily.       . Calcium-Magnesium-Vitamin D (CALCIUM 500 PO) Take 1 tablet by mouth 2 (two) times daily.      . carvedilol (COREG) 6.25 MG tablet Take 6.25 mg by mouth 2 (two) times daily with a meal.      . digoxin (LANOXIN) 0.125 MG tablet Take by mouth  daily.      [name] esomeprazole (NEXIUM) 40 MG capsule Take 40 mg by mouth daily at 12 noon.      . furosemide (LASIX) 40 MG tablet Take one (1) tab in the am and half (1/2) in the pm      . Ipratropium-Albuterol (COMBIVENT) 20-100 MCG/ACT AERS respimat Inhale 1 puff into the lungs every 6 (six) hours.      [name] ipratropium-albuterol (DUONEB) 0.5-2.5 (3) MG/3ML SOLN Take 3 mLs by nebulization.      [name] levothyroxine (SYNTHROID, LEVOTHROID) 100 MCG tablet Take 100 mcg by mouth daily before breakfast.       . potassium chloride SA (K-DUR,KLOR-CON) 20 MEQ tablet Take 20 mEq by mouth once.       [name] rOPINIRole (REQUIP) 4 MG tablet Take 4 mg by mouth at bedtime.      . vitamin C (ASCORBIC ACID) 500 MG tablet Take 500 mg by mouth daily.       No current facility-administered medications for this visit.     Allergies:   Review of patient's allergies indicates no known allergies.   Social History:  The patient  reports that she has quit smoking. She does not have any smokeless tobacco history on file.   Family History:  The patient's She was adopted. Family history is unknown by patient.   ROS:  Please see the history of present illness.   She notes scant hemoptysis in the past.   All other systems reviewed and negative.   PHYSICAL EXAM: VS:  BP 131/80  Pulse 64  Ht 5\' 2"  (1.575 m)  Wt 145 lb (65.772 kg)  BMI 26.51 kg/m2 Well nourished, well developed, in no acute distress HEENT: normal Neck: no JVD Cardiac:  normal S1, S2; irregularly irregular rhythm; no murmur Lungs:  Decreased breath sounds bilaterally, no wheezing, rhonchi or rales Abd: soft, nontender, no hepatomegaly Ext: no LE edema Skin: warm and dry Neuro:  CNs 2-12 intact, no focal abnormalities noted  EKG:  AFib, HR 64, diffuse ST abnormality - ? Dig effect, no significant change from prior tracing       ASSESSMENT AND PLAN:  1. Chronic diastolic heart failure: Volume appears stable.  She had recent adjustments made in her diuretic  by her PCP.  FU BMET is pending.  We discussed the importance of daily weights and when to call.   2. Chronic atrial fibrillation:  Rate is controlled.  We had a long discussion regarding prophylaxis for stroke with Eliquis.  The dose that she should be on is 5 mg bid (age < 26, Creatinine < 1.5, weight > 60 kg).  However, she has valvular heart disease.  Therefore, I will defer on adjusting her dose until I can discuss this further with Dr. Candee Furbish. I reviewed this with the patient today.  3. Coronary artery disease s/p CABG:  No angina.  She is off ASA as she is on Eliquis.  Continue statin, beta blocker.   4. Valvular heart disease s/p AVR and MVR (Bioprosthetic):  Prosthetic heart valves well functioning on recent Echo.  Continue SBE prophylaxis. 5. Lung nodule:  Concerning features noted on CT done today.  I spoke with Dr. Marisue Humble by phone.  He will be contacting the patient to discuss this further and to arrange evaluation.   6. Essential hypertension:  Controlled. 7. HLD (hyperlipidemia):  Continue statin.  8. COPD:  She is significantly limited by her chronic dyspnea.    Disposition:  FU with Dr. Candee Furbish in 2 mos.   Signed, Versie Starks, MHS 12/02/2013 12:09 PM    [address], [place], [place]  [address] Phone: [phone]; Fax: [phone]

## 2013-12-02 NOTE — Patient Instructions (Signed)
Your physician recommends that you continue on your current medications as directed. Please refer to the Current Medication list given to you today.  Your physician recommends that you schedule a follow-up appointment in: 8 weeks with Dr. Marlou Porch.

## 2013-12-03 ENCOUNTER — Other Ambulatory Visit (HOSPITAL_COMMUNITY): Payer: Self-pay | Admitting: Family Medicine

## 2013-12-03 DIAGNOSIS — R9389 Abnormal findings on diagnostic imaging of other specified body structures: Secondary | ICD-10-CM

## 2013-12-03 DIAGNOSIS — R911 Solitary pulmonary nodule: Secondary | ICD-10-CM

## 2013-12-06 ENCOUNTER — Telehealth: Payer: Self-pay | Admitting: Physician Assistant

## 2013-12-06 NOTE — Telephone Encounter (Signed)
Please tell Shelise Maron that I reviewed everything with Dr. Candee Furbish. She really should not be on Eliquis.  This drug was not studied in patients with AFib who have the same valvular problems she does. The only drug that we use for patients like her is Coumadin (as far as blood thinners go). We recommend stopping Eliquis and restarting coumadin.  She has not taken this in years.  If she is agreeable, have her see the coumadin clinic to transition her from Eliquis to Coumadin.  I would not stop it until she sees them so they can manage it. If she has any bleeding issues after starting, she should let us know.  However, she has been taking the Eliquis without problems, so I hopeful she will tolerate. If she is not willing to try coumadin, she should stop the Eliquis and start back on an ASA 81 mg QD. Richardson Dopp, PA-C   12/06/2013 2:07 PM

## 2013-12-10 ENCOUNTER — Telehealth: Payer: Self-pay | Admitting: *Deleted

## 2013-12-10 MED ORDER — ASPIRIN EC 81 MG PO TBEC: 81.0000 mg | DELAYED_RELEASE_TABLET | Freq: Every day | ORAL | Status: DC

## 2013-12-10 NOTE — Progress Notes (Signed)
Case reviewed with Dr. Candee Furbish.  Given her AFib in setting of valvular heart disease, she should only be on Warfarin.   We have contacted the patient and asked her to resume Warfarin instead of Eliquis.  She declined citing prior hx of bleeding from ears.  She requests to be on ASA only.  Patient was advised to stop Eliquis and resume ASA. Signed,  Versie Starks, MHS 12/10/2013 10:36 PM    [facility] [address], [place], [place]  [address] Phone: [phone]; Fax: [phone]

## 2013-12-10 NOTE — Telephone Encounter (Signed)
s/w pt's daughter [name] in reference to recommendation to have pt D/C Eliquis and re-start coumadin. Daughter states does not really want ot do this since pt had a bleed in the past w/ears and would like to have pt just start on ASA 81 mg tomorrow 9/23.

## 2013-12-10 NOTE — Telephone Encounter (Signed)
[place], [place]   12/10/2013 10:36 PM

## 2013-12-11 NOTE — Telephone Encounter (Signed)
Thanks for update. She understands increased stroke risk without anticoagulation.  Should not be on Eliquis with valve replacement.   Candee Furbish, MD

## 2013-12-16 ENCOUNTER — Ambulatory Visit (HOSPITAL_COMMUNITY)
Admission: RE | Admit: 2013-12-16 | Discharge: 2013-12-16 | Disposition: A | Discharge status: Home / Self Care | Payer: Medicare Other | Source: Ambulatory Visit | Attending: Family Medicine | Admitting: Family Medicine

## 2013-12-16 ENCOUNTER — Encounter (HOSPITAL_COMMUNITY): Payer: Self-pay | Admitting: Radiology

## 2013-12-16 DIAGNOSIS — R9389 Abnormal findings on diagnostic imaging of other specified body structures: Secondary | ICD-10-CM | POA: Diagnosis not present

## 2013-12-16 DIAGNOSIS — R911 Solitary pulmonary nodule: Secondary | ICD-10-CM

## 2013-12-16 LAB — GLUCOSE, CAPILLARY: Glucose-Capillary: 140 mg/dL — ABNORMAL HIGH (ref 70–99)

## 2013-12-16 MED ORDER — FLUDEOXYGLUCOSE F - 18 (FDG) INJECTION
7.2600 | Freq: Once | INTRAVENOUS | Status: AC | PRN
  Administered 2013-12-16: 7.26 via INTRAVENOUS

## 2013-12-19 ENCOUNTER — Telehealth: Payer: Self-pay | Admitting: *Deleted

## 2013-12-19 ENCOUNTER — Encounter: Payer: Self-pay | Admitting: *Deleted

## 2013-12-19 ENCOUNTER — Other Ambulatory Visit: Payer: Self-pay | Admitting: *Deleted

## 2013-12-19 DIAGNOSIS — C3411 Malignant neoplasm of upper lobe, right bronchus or lung: Secondary | ICD-10-CM | POA: Insufficient documentation

## 2013-12-19 DIAGNOSIS — R918 Other nonspecific abnormal finding of lung field: Secondary | ICD-10-CM

## 2013-12-19 NOTE — Progress Notes (Signed)
Called and scheduled PFT's per Dr. Roxan Hockey

## 2013-12-19 NOTE — Telephone Encounter (Signed)
Unable to call patient due to note stating not able to call phone until 8:30 pm.  I will notify referring office to contact patient with appt's

## 2013-12-19 NOTE — Progress Notes (Signed)
Spoke with referring office regarding appt's for PFT and Thoracic clinic.   They will notify patient

## 2013-12-20 ENCOUNTER — Encounter: Payer: Self-pay | Admitting: *Deleted

## 2013-12-20 NOTE — Progress Notes (Signed)
Called referring office to see if they have been able to contact the patient.  They have not reached her as of today.  Office stated they would try again today to reach patient

## 2013-12-23 ENCOUNTER — Other Ambulatory Visit: Payer: Self-pay

## 2013-12-23 ENCOUNTER — Ambulatory Visit (HOSPITAL_COMMUNITY)
Admission: RE | Admit: 2013-12-23 | Discharge: 2013-12-23 | Disposition: A | Discharge status: Home / Self Care | Payer: Medicare Other | Source: Ambulatory Visit | Attending: Thoracic Surgery (Cardiothoracic Vascular Surgery) | Admitting: Thoracic Surgery (Cardiothoracic Vascular Surgery)

## 2013-12-23 DIAGNOSIS — R918 Other nonspecific abnormal finding of lung field: Secondary | ICD-10-CM

## 2013-12-23 DIAGNOSIS — Z720 Tobacco use: Secondary | ICD-10-CM | POA: Diagnosis not present

## 2013-12-23 LAB — PULMONARY FUNCTION TEST
DL/VA % pred: 66 %
DL/VA: 2.92 ml/min/mmHg/L
DLCO unc % pred: 37 %
DLCO unc: 7.62 ml/min/mmHg
FEF 25-75 Post: 0.69 L/sec
FEF 25-75 Pre: 0.57 L/sec
FEF2575-%CHANGE-POST: 21 %
FEF2575-%PRED-POST: 52 %
FEF2575-%Pred-Pre: 43 %
FEV1-%Change-Post: 12 %
FEV1-%PRED-PRE: 63 %
FEV1-%Pred-Post: 71 %
FEV1-Post: 1.23 L
FEV1-Pre: 1.09 L
FEV1FVC-%Change-Post: 3 %
FEV1FVC-%Pred-Pre: 78 %
FEV6-%CHANGE-POST: 8 %
FEV6-%PRED-PRE: 84 %
FEV6-%Pred-Post: 92 %
FEV6-PRE: 1.85 L
FEV6-Post: 2.01 L
FEV6FVC-%CHANGE-POST: 0 %
FEV6FVC-%Pred-Post: 104 %
FEV6FVC-%Pred-Pre: 104 %
FVC-%Change-Post: 8 %
FVC-%PRED-POST: 88 %
FVC-%Pred-Pre: 81 %
FVC-POST: 2.04 L
FVC-PRE: 1.87 L
POST FEV6/FVC RATIO: 98 %
PRE FEV1/FVC RATIO: 58 %
Post FEV1/FVC ratio: 60 %
Pre FEV6/FVC Ratio: 99 %
RV % PRED: 95 %
RV: 2.12 L
TLC % pred: 89 %
TLC: 4.1 L

## 2013-12-23 MED ORDER — ALBUTEROL SULFATE (2.5 MG/3ML) 0.083% IN NEBU
2.5000 mg | INHALATION_SOLUTION | Freq: Once | RESPIRATORY_TRACT | Status: AC
  Administered 2013-12-23: 2.5 mg via RESPIRATORY_TRACT

## 2013-12-24 ENCOUNTER — Encounter: Payer: Self-pay | Admitting: *Deleted

## 2013-12-24 NOTE — Progress Notes (Signed)
Called and spoke with daughter.  I gave her appt to see Dr. Roxan Hockey on 12/30/13 at 2:45.  I have her address and phone number.  She verbalized understanding of appt.

## 2013-12-25 ENCOUNTER — Encounter (HOSPITAL_COMMUNITY): Payer: Self-pay

## 2013-12-25 ENCOUNTER — Encounter: Payer: Self-pay | Admitting: *Deleted

## 2013-12-30 ENCOUNTER — Institutional Professional Consult (permissible substitution) (INDEPENDENT_AMBULATORY_CARE_PROVIDER_SITE_OTHER): Payer: Medicare Other | Admitting: Thoracic Surgery (Cardiothoracic Vascular Surgery)

## 2013-12-30 ENCOUNTER — Other Ambulatory Visit: Payer: Self-pay

## 2013-12-30 ENCOUNTER — Encounter (HOSPITAL_COMMUNITY): Payer: Self-pay | Admitting: Pharmacy Technician

## 2013-12-30 ENCOUNTER — Encounter: Payer: Self-pay | Admitting: Thoracic Surgery (Cardiothoracic Vascular Surgery)

## 2013-12-30 VITALS — BP 151/79 | HR 60 | Ht 62.0 in | Wt 145.0 lb

## 2013-12-30 DIAGNOSIS — I2510 Atherosclerotic heart disease of native coronary artery without angina pectoris: Secondary | ICD-10-CM

## 2013-12-30 DIAGNOSIS — R911 Solitary pulmonary nodule: Secondary | ICD-10-CM

## 2013-12-30 DIAGNOSIS — I059 Rheumatic mitral valve disease, unspecified: Secondary | ICD-10-CM

## 2013-12-30 DIAGNOSIS — J432 Centrilobular emphysema: Secondary | ICD-10-CM

## 2013-12-30 DIAGNOSIS — I2722 Pulmonary hypertension due to left heart disease: Secondary | ICD-10-CM

## 2013-12-30 DIAGNOSIS — I272 Other secondary pulmonary hypertension: Secondary | ICD-10-CM

## 2013-12-30 NOTE — Progress Notes (Signed)
PCP is [name], MD Referring Provider is Gaynelle Arabian, MD  Chief Complaint  Patient presents with  . NEW THORACIC    CONSULT    HPI: 80 year old woman sent for consultation regarding a right upper lobe nodule.  Ms. Marissa [name] is a 80 year old woman with a complex medical history which includes coronary bypass grafting and aortic valve replacement in 2008, redo bypass grafting and mitral valve replacement in 2013, congestive heart failure, rheumatic heart disease, atrial fibrillation, pulmonary hypertension, and COPD.  She recently was hospitalized with congestive heart failure manifested by shortness of breath and orthopnea. According to her daughter she was found to have fluid around her lungs and her heart. During the workup she had a chest x-ray which showed a right upper lobe nodule. A CT scan was done which confirmed a 1.6 cm spiculated nodule in the right upper lobe. PET CT then showed the lesion to be hypermetabolic with an SUV max of 5.14. There was no hypermetabolic hilar or mediastinal adenopathy.  She says that she gets short of breath with exertion, but is very cognizant of keeping her pace slow. As long as she walks slowly she can get up a flight of stairs. She denies any recent weight loss. She has not had any recent unusual headaches or visual changes. She has an occasional cough, but no hemoptysis. She says that she does wheeze occasionally, but she is good about taking her inhalers as needed.  ECOG/ZUBROD= 1   Past Medical History  Diagnosis Date  . HTN (hypertension)   . Hypercholesteremia   . CAD (coronary artery disease)     s/p CABG 03/2011  . Aortic valve disorders   . RLS (restless legs syndrome)   . A-fib   . Osteopenia   . Mitral valve disease     s/p MVR 03/2011 w/ 27 mm porcine valve  . Hypothyroidism   . COPD (chronic obstructive pulmonary disease)   . CHF (congestive heart failure)   . Aortic stenosis     (s/p AVR w/ 21 mm porcine valve 03/2011   . Mastoiditis     Past Surgical History  Procedure Laterality Date  . Coronary artery bypass graft    . Mitral valve replacement    . Laparoscopic cholecystectomy    . Aortic valve replacement      Family History  Problem Relation Age of Onset  . Adopted: Yes   she is not aware of any major illnesses in the family. Specifically no history of heart disease or COPD or lung cancer.  Social History History  Substance Use Topics  . Smoking status: Former Research scientist (life sciences)  . Smokeless tobacco: Not on file  . Alcohol Use: Not on file   smoked less than a pack a day for about 30 years. 15-pack-year history, quit many years ago.  Current Outpatient Prescriptions  Medication Sig Dispense Refill  . aspirin EC 81 MG tablet Take 1 tablet (81 mg total) by mouth daily.      [name] atorvastatin (LIPITOR) 40 MG tablet Take 40 mg by mouth daily.       . Calcium-Magnesium-Vitamin D (CALCIUM 500 PO) Take 1 tablet by mouth 2 (two) times daily.      . carvedilol (COREG) 6.25 MG tablet Take 1 tablet (6.25 mg total) by mouth 2 (two) times daily with a meal.  180 tablet  1  . digoxin (LANOXIN) 0.125 MG tablet Take by mouth daily.      [name] esomeprazole (NEXIUM) 40 MG capsule Take  40 mg by mouth daily at 12 noon.      . furosemide (LASIX) 40 MG tablet Take one (1) tab in the am and half (1/2) in the pm      . Ipratropium-Albuterol (COMBIVENT) 20-100 MCG/ACT AERS respimat Inhale 1 puff into the lungs every 6 (six) hours.      [name] ipratropium-albuterol (DUONEB) 0.5-2.5 (3) MG/3ML SOLN Take 3 mLs by nebulization.      [name] levothyroxine (SYNTHROID, LEVOTHROID) 100 MCG tablet Take 100 mcg by mouth daily before breakfast.       . potassium chloride SA (K-DUR,KLOR-CON) 20 MEQ tablet Take 20 mEq by mouth once.       [name] rOPINIRole (REQUIP) 4 MG tablet Take 4 mg by mouth at bedtime.      . vitamin C (ASCORBIC ACID) 500 MG tablet Take 500 mg by mouth daily.       No current facility-administered medications for this visit.     Allergies  Allergen Reactions  . Prozac [Fluoxetine Hcl]     Review of Systems  Constitutional: Negative for appetite change and unexpected weight change.  HENT: Positive for hearing loss.   Respiratory: Positive for shortness of breath.   Cardiovascular: Positive for leg swelling. Negative for chest pain.       Orthopnea  Hematological: Negative for adenopathy. Bruises/bleeds easily.  All other systems reviewed and are negative.   BP 151/79  Pulse 60  Ht 5\' 2"  (1.575 m)  Wt 145 lb (65.772 kg)  BMI 26.51 kg/m2  SpO2 92% Physical Exam  Vitals reviewed. Constitutional: She is oriented to person, place, and time. She appears well-developed and well-nourished. No distress.  HENT:  Head: Normocephalic and atraumatic.  Eyes: EOM are normal.  Neck: Neck supple. No thyromegaly present.  Cardiovascular: Normal rate and regular rhythm.   Murmur (3/6 systolic) heard. Pulmonary/Chest: She has wheezes (faint bilateral).  Well healed sternotomy scar  Abdominal: Soft. There is no tenderness.  Musculoskeletal: She exhibits no edema.  Lymphadenopathy:    She has no cervical adenopathy.  Neurological: She is alert and oriented to person, place, and time. No cranial nerve deficit.  Skin: Skin is warm and dry.     Diagnostic Tests: CT CHEST WITHOUT CONTRAST  TECHNIQUE:  Multidetector CT imaging of the chest was performed following the  standard protocol without IV contrast.  COMPARISON: 11/03/2013  FINDINGS:  Right proximal humeral deformity and advanced degenerative  glenohumeral arthropathy on the right.  Aortic atherosclerosis. Prior CABG. Prior mitral and aortic valve  prostheses. Enlarged left atrium. Adrenal glands unremarkable.  Centrilobular emphysema. Marginally spiculated at 1.6 by 1.4 cm  right upper lobe pulmonary nodule, image 18 series 4, corresponding  to the density at chest radiography.  Bandlike scarring or atelectasis in the right lower lobe with some   associated punctate calcification. Slightly lobular pleural adipose  tissue along the right hemidiaphragm.  Abnormal triangular 3.5 x 1.9 cm density in the left lower lobe with  associated calcification and volume loss, probably chronic  atelectasis or scarring, less likely to harbor a malignancy. I  measured this on image 38 of series 4.  Thoracic spondylosis.  3 x 2 mm subpleural nodule, left upper lobe, image 24 series 4.  IMPRESSION:  1. Marginally spiculated 1.6 x 1.4 cm right upper lobe pulmonary  nodule, persistent over the past month. Appearance highly concerning  for bronchogenic carcinoma. Tissue diagnosis and/or nuclear medicine  PET-CT recommended.  2. Triangular 3.5 x 1.9 cm left lower  lobe airspace opacity with  associated volume loss and calcification, probably from scarring or  chronic atelectasis, less likely to harbor malignancy. This could  also be assessed further at PET-CT.  3. Bandlike scarring in the right lower lobe.  4. Centrilobular emphysema.  5. Atherosclerosis and enlarged left atrium. Prior valve prostheses  and CABG.  6. 3 mm subpleural nodule in the left upper lobe, is probably benign  but merit observation on followup studies.  These results will be called to the ordering clinician or  representative by the Radiologist Assistant, and communication  documented in the PACS or zVision Dashboard.  Electronically Signed  By: Sherryl Barters M.D.  On: 12/02/2013 10:12   NUCLEAR MEDICINE PET SKULL BASE TO THIGH  TECHNIQUE:  7.3 mCi F-18 FDG was injected intravenously. Full-ring PET imaging  was performed from the skull base to thigh after the radiotracer. CT  data was obtained and used for attenuation correction and anatomic  localization.  FASTING BLOOD GLUCOSE: Value: 140 mg/dl  COMPARISON: CT 12/02/2013  FINDINGS:  NECK  No hypermetabolic lymph nodes in the neck.  CHEST  Within the right upper lobe there is a noncalcified nodule measuring  16  mm (image 57, series 4) with spiculated margin and metabolic  activity with SUV max equal 5.1. No additional hypermetabolic  nodules. There is atelectasis at the left and right lung base. There  are no hypermetabolic mediastinal lymph nodes.  Diffuse hypermetabolic activity associated with thyroid gland.  ABDOMEN/PELVIS  No abnormal hypermetabolic activity within the liver, pancreas,  adrenal glands, or spleen. No hypermetabolic lymph nodes in the  abdomen or pelvis. Low-density cyst associated with the right kidney  without metabolic activity. The lymphadenectomy clips in the pelvis.  SKELETON  No focal hypermetabolic activity to suggest skeletal metastasis.  IMPRESSION:  1. Hypermetabolic right upper lobe pulmonary nodule without evidence  of mediastinal metastasis or distant metastasis.  2. Staging by FDG PET-CT imaging T1a N0 M0 (if non-small cell lung  cancer as expected). Recommend thoracic surgery consultation.  3. Diffuse metabolic activity associated with the thyroid gland  consistent with thyroiditis.  These results will be called to the ordering clinician or  representative by the Radiologist Assistant, and communication  documented in the PACS or zVision Dashboard.  Electronically Signed  By: Suzy Bouchard M.D.  On: 12/16/2013 11:59   PULMONARY FUNCTION TESTS FVC 1.87(81%) 2.04(88%) post bronchodilator FEV1  1.09(63%) 1.23(71%) FEV1/ FVC= 58% DLCO= 7.62(37%)  Impression: 80 year old woman with multiple medical problems who has a hypermetabolic spiculated nodule in the right upper lobe. This is highly suspicious for a stage IA non-small cell carcinoma. She does not have a heavy smoking history, but given the any smoking history in her age group this most likely is a cancer. Other possibilities include AFB or fungal infection. There is some scarring in the left base that was not hypermetabolic on PET.  Ms. Frommelt has a complex cardiac history with coronary disease,  rheumatic mitral stenosis, and aortic stenosis. She has had CABG, aVR with a tissue valve in 2008. She then had a redo CABG and mitral valve replacement with a tissue valve in 2013. She has known pulmonary hypertension. A recent echocardiogram showed good left ventricular systolic function, but an elevated LVEDP and moderate pulmonary hypertension with an estimated RV systolic of 53 mmHg.  She also has some significant pulmonary disease. Her flows are diminished but not prohibitive. However her diffusion capacity is severely impaired at 37% predicted.  Neither her cardiac or  pulmonary issues are absolute contraindications to surgical resection, although the DLCO is close to being prohibitive. However when all of these issues are taken into account I think that she would be an extremely high-risk surgical candidate.  In my opinion a better option for her would be SBRT. Although the chance of cure would be less than a lobectomy, I do not think she would tolerate a lobectomy or a physiologic standpoint. The likelihood of cure probably approaches that of a more limited resection, without the high operative risk given her cardiac and pulmonary issues.  I recommended to her that we do an electromagnetic navigational bronchoscopy and endobronchial ultrasound for diagnostic and staging purposes. I discussed the general nature of the procedure with the patient and her daughter. They understand the need for general anesthesia and the endoscopic nature of the procedure. They understand that no guarantee can be given that there will be definitively diagnostic. They understand the risk of procedure include those related general anesthesia as well as bleeding, pneumothorax, nondiagnostic biopsies, as well as the possibility of unforeseeable complications.  She agrees to proceed.  Plan: Electromagnetic navigational bronchoscopy and endobronchial ultrasound on Monday, 01/06/2014

## 2014-01-02 ENCOUNTER — Encounter (HOSPITAL_COMMUNITY)
Admission: RE | Admit: 2014-01-02 | Discharge: 2014-01-02 | Disposition: A | Discharge status: Home / Self Care | Payer: Medicare Other | Source: Ambulatory Visit | Attending: Thoracic Surgery (Cardiothoracic Vascular Surgery) | Admitting: Thoracic Surgery (Cardiothoracic Vascular Surgery)

## 2014-01-02 ENCOUNTER — Encounter (HOSPITAL_COMMUNITY): Payer: Self-pay

## 2014-01-02 VITALS — BP 133/54 | HR 66 | Temp 98.1°F | Resp 20 | Ht 61.0 in | Wt 146.5 lb

## 2014-01-02 DIAGNOSIS — Z01818 Encounter for other preprocedural examination: Secondary | ICD-10-CM | POA: Diagnosis not present

## 2014-01-02 DIAGNOSIS — I2510 Atherosclerotic heart disease of native coronary artery without angina pectoris: Secondary | ICD-10-CM | POA: Diagnosis not present

## 2014-01-02 DIAGNOSIS — R911 Solitary pulmonary nodule: Secondary | ICD-10-CM | POA: Diagnosis not present

## 2014-01-02 DIAGNOSIS — I10 Essential (primary) hypertension: Secondary | ICD-10-CM | POA: Insufficient documentation

## 2014-01-02 HISTORY — DX: Cardiac arrhythmia, unspecified: I49.9

## 2014-01-02 HISTORY — DX: Unspecified osteoarthritis, unspecified site: M19.90

## 2014-01-02 HISTORY — DX: Gastro-esophageal reflux disease without esophagitis: K21.9

## 2014-01-02 HISTORY — DX: Prediabetes: R73.03

## 2014-01-02 HISTORY — DX: Acute myocardial infarction, unspecified: I21.9

## 2014-01-02 HISTORY — DX: Personal history of other medical treatment: Z92.89

## 2014-01-02 HISTORY — DX: Shortness of breath: R06.02

## 2014-01-02 LAB — COMPREHENSIVE METABOLIC PANEL
ALT: 15 U/L (ref 0–35)
ANION GAP: 16 — AB (ref 5–15)
AST: 16 U/L (ref 0–37)
Albumin: 3.6 g/dL (ref 3.5–5.2)
Alkaline Phosphatase: 103 U/L (ref 39–117)
BILIRUBIN TOTAL: 0.9 mg/dL (ref 0.3–1.2)
BUN: 17 mg/dL (ref 6–23)
CHLORIDE: 96 meq/L (ref 96–112)
CO2: 29 meq/L (ref 19–32)
Calcium: 9.2 mg/dL (ref 8.4–10.5)
Creatinine, Ser: 0.81 mg/dL (ref 0.50–1.10)
GFR calc Af Amer: 79 mL/min — ABNORMAL LOW (ref >90)
GFR, EST NON AFRICAN AMERICAN: 68 mL/min — AB (ref >90)
Glucose, Bld: 209 mg/dL — ABNORMAL HIGH (ref 70–99)
Potassium: 3.7 mEq/L (ref 3.7–5.3)
Sodium: 141 mEq/L (ref 137–147)
Total Protein: 7.2 g/dL (ref 6.0–8.3)

## 2014-01-02 LAB — CBC
HEMATOCRIT: 42.5 % (ref 36.0–46.0)
HEMOGLOBIN: 13.9 g/dL (ref 12.0–15.0)
MCH: 28.8 pg (ref 26.0–34.0)
MCHC: 32.7 g/dL (ref 30.0–36.0)
MCV: 88.2 fL (ref 78.0–100.0)
Platelets: 182 10*3/uL (ref 150–400)
RBC: 4.82 MIL/uL (ref 3.87–5.11)
RDW: 14.6 % (ref 11.5–15.5)
WBC: 10.9 10*3/uL — AB (ref 4.0–10.5)

## 2014-01-02 LAB — PROTIME-INR
INR: 1.04 (ref 0.00–1.49)
PROTHROMBIN TIME: 13.7 s (ref 11.6–15.2)

## 2014-01-02 LAB — APTT: aPTT: 35 seconds (ref 24–37)

## 2014-01-02 NOTE — Pre-Procedure Instructions (Addendum)
Marissa [name]  01/02/2014   Your procedure is scheduled on:  Monday, October 19.  Report to [facility] Admitting at 5:30 AM.  Call this number if you have problems the morning of surgery: [phone]   Remember:   Do not eat food or drink liquids after midnight Sunday, October 18.   Take these medicines the morning of surgery with A SIP OF WATER: carvedilol (COREG), digoxin (LANOXIN), levothyroxine (SYNTHROID, LEVOTHROID).            Use Inhalers, bring Combivent Inhaler to the hospital with you.              Stop taking Vitamins and Herbal medications.   Do not wear jewelry, make-up or nail polish.  Do not wear lotions, powders, or perfumes.   Do not shave 48 hours prior to surgery.  Do not bring valuables to the hospital.               [facility] is not responsible for any belongings or valuables.               Contacts, dentures or bridgework may not be worn into surgery.  Leave suitcase in the car. After surgery it may be brought to your room.  For patients admitted to the hospital, discharge time is determined by your treatment team.               Patients discharged the day of surgery will not be allowed to drive home.  Name and phone number of your driver: -   Special Instructions: Review  [name] - Preparing For Surgery.   Please read over the following fact sheets that you were given: Pain Booklet, Coughing and Deep Breathing and Surgical Site Infection Prevention

## 2014-01-02 NOTE — Progress Notes (Signed)
Marissa [name] notified patient to contiue Aspirn, but do not take the day of surgery.

## 2014-01-02 NOTE — Progress Notes (Signed)
I called and left a message for Jolene inquiring about ASA 81 mg.  Patient has a history of AFib and ASA is her only anticoagulant. I asked Jolene to notify patient.

## 2014-01-06 ENCOUNTER — Encounter (HOSPITAL_COMMUNITY)
Admission: RE | Disposition: A | Discharge status: Home / Self Care | Payer: Self-pay | Source: Ambulatory Visit | Attending: Thoracic Surgery (Cardiothoracic Vascular Surgery)

## 2014-01-06 ENCOUNTER — Ambulatory Visit (HOSPITAL_COMMUNITY): Payer: Medicare Other

## 2014-01-06 ENCOUNTER — Encounter (HOSPITAL_COMMUNITY): Payer: Medicare Other | Admitting: Anesthesiology

## 2014-01-06 ENCOUNTER — Ambulatory Visit (HOSPITAL_COMMUNITY): Payer: Medicare Other | Admitting: Anesthesiology

## 2014-01-06 ENCOUNTER — Ambulatory Visit (HOSPITAL_COMMUNITY)
Admission: RE | Admit: 2014-01-06 | Discharge: 2014-01-06 | Disposition: A | Discharge status: Home / Self Care | Payer: Medicare Other | Source: Ambulatory Visit | Attending: Thoracic Surgery (Cardiothoracic Vascular Surgery) | Admitting: Thoracic Surgery (Cardiothoracic Vascular Surgery)

## 2014-01-06 ENCOUNTER — Encounter (HOSPITAL_COMMUNITY): Payer: Self-pay | Admitting: Certified Registered Nurse Anesthetist

## 2014-01-06 DIAGNOSIS — R911 Solitary pulmonary nodule: Secondary | ICD-10-CM | POA: Diagnosis not present

## 2014-01-06 DIAGNOSIS — R918 Other nonspecific abnormal finding of lung field: Secondary | ICD-10-CM | POA: Diagnosis not present

## 2014-01-06 DIAGNOSIS — I509 Heart failure, unspecified: Secondary | ICD-10-CM | POA: Insufficient documentation

## 2014-01-06 DIAGNOSIS — K219 Gastro-esophageal reflux disease without esophagitis: Secondary | ICD-10-CM | POA: Diagnosis not present

## 2014-01-06 DIAGNOSIS — I4891 Unspecified atrial fibrillation: Secondary | ICD-10-CM | POA: Diagnosis not present

## 2014-01-06 DIAGNOSIS — Z7982 Long term (current) use of aspirin: Secondary | ICD-10-CM | POA: Insufficient documentation

## 2014-01-06 DIAGNOSIS — E780 Pure hypercholesterolemia: Secondary | ICD-10-CM | POA: Insufficient documentation

## 2014-01-06 DIAGNOSIS — J439 Emphysema, unspecified: Secondary | ICD-10-CM | POA: Diagnosis not present

## 2014-01-06 DIAGNOSIS — E039 Hypothyroidism, unspecified: Secondary | ICD-10-CM | POA: Diagnosis not present

## 2014-01-06 DIAGNOSIS — J449 Chronic obstructive pulmonary disease, unspecified: Secondary | ICD-10-CM | POA: Diagnosis not present

## 2014-01-06 DIAGNOSIS — Z79899 Other long term (current) drug therapy: Secondary | ICD-10-CM | POA: Insufficient documentation

## 2014-01-06 DIAGNOSIS — I2510 Atherosclerotic heart disease of native coronary artery without angina pectoris: Secondary | ICD-10-CM | POA: Diagnosis not present

## 2014-01-06 DIAGNOSIS — I10 Essential (primary) hypertension: Secondary | ICD-10-CM | POA: Insufficient documentation

## 2014-01-06 DIAGNOSIS — Z87891 Personal history of nicotine dependence: Secondary | ICD-10-CM | POA: Diagnosis not present

## 2014-01-06 DIAGNOSIS — Z01818 Encounter for other preprocedural examination: Secondary | ICD-10-CM | POA: Diagnosis not present

## 2014-01-06 HISTORY — PX: VIDEO BRONCHOSCOPY WITH ENDOBRONCHIAL NAVIGATION: SHX6175

## 2014-01-06 HISTORY — PX: VIDEO BRONCHOSCOPY WITH ENDOBRONCHIAL ULTRASOUND: SHX6177

## 2014-01-06 LAB — GLUCOSE, CAPILLARY
GLUCOSE-CAPILLARY: 169 mg/dL — AB (ref 70–99)
GLUCOSE-CAPILLARY: 143 mg/dL — AB (ref 70–99)

## 2014-01-06 SURGERY — VIDEO BRONCHOSCOPY WITH ENDOBRONCHIAL NAVIGATION
Anesthesia: General

## 2014-01-06 MED ORDER — FENTANYL CITRATE 0.05 MG/ML IJ SOLN
INTRAMUSCULAR | Status: DC | PRN
  Administered 2014-01-06: 100 ug via INTRAVENOUS

## 2014-01-06 MED ORDER — EPINEPHRINE HCL 1 MG/ML IJ SOLN
INTRAMUSCULAR | Status: AC
  Filled 2014-01-06: qty 1

## 2014-01-06 MED ORDER — SCOPOLAMINE 1 MG/3DAYS TD PT72
MEDICATED_PATCH | TRANSDERMAL | Status: AC
  Administered 2014-01-06: 1 via TRANSDERMAL
  Filled 2014-01-06: qty 1

## 2014-01-06 MED ORDER — MIDAZOLAM HCL 5 MG/5ML IJ SOLN
INTRAMUSCULAR | Status: DC | PRN
  Administered 2014-01-06: 1 mg via INTRAVENOUS

## 2014-01-06 MED ORDER — PROPOFOL 10 MG/ML IV BOLUS
INTRAVENOUS | Status: AC
  Filled 2014-01-06: qty 20

## 2014-01-06 MED ORDER — DEXAMETHASONE SODIUM PHOSPHATE 4 MG/ML IJ SOLN
INTRAMUSCULAR | Status: DC | PRN
  Administered 2014-01-06: 4 mg via INTRAVENOUS

## 2014-01-06 MED ORDER — NEOSTIGMINE METHYLSULFATE 10 MG/10ML IV SOLN
INTRAVENOUS | Status: AC
  Filled 2014-01-06: qty 1

## 2014-01-06 MED ORDER — SODIUM CHLORIDE 0.9 % IJ SOLN
INTRAMUSCULAR | Status: AC
  Filled 2014-01-06: qty 10

## 2014-01-06 MED ORDER — ARTIFICIAL TEARS OP OINT
TOPICAL_OINTMENT | OPHTHALMIC | Status: AC
  Filled 2014-01-06: qty 3.5

## 2014-01-06 MED ORDER — HYDROMORPHONE HCL 1 MG/ML IJ SOLN: 0.2500 mg | INTRAMUSCULAR | Status: DC | PRN

## 2014-01-06 MED ORDER — LIDOCAINE HCL (CARDIAC) 20 MG/ML IV SOLN
INTRAVENOUS | Status: AC
  Filled 2014-01-06: qty 5

## 2014-01-06 MED ORDER — ONDANSETRON HCL 4 MG/2ML IJ SOLN
INTRAMUSCULAR | Status: DC | PRN
  Administered 2014-01-06: 4 mg via INTRAVENOUS

## 2014-01-06 MED ORDER — ARTIFICIAL TEARS OP OINT
TOPICAL_OINTMENT | OPHTHALMIC | Status: DC | PRN
  Administered 2014-01-06: 1 via OPHTHALMIC

## 2014-01-06 MED ORDER — PROPOFOL 10 MG/ML IV BOLUS
INTRAVENOUS | Status: DC | PRN
  Administered 2014-01-06 (×2): 40 mg via INTRAVENOUS
  Administered 2014-01-06: 100 mg via INTRAVENOUS

## 2014-01-06 MED ORDER — 0.9 % SODIUM CHLORIDE (POUR BTL) OPTIME
TOPICAL | Status: DC | PRN
  Administered 2014-01-06: 1000 mL

## 2014-01-06 MED ORDER — GLYCOPYRROLATE 0.2 MG/ML IJ SOLN
INTRAMUSCULAR | Status: AC
  Filled 2014-01-06: qty 3

## 2014-01-06 MED ORDER — OXYCODONE HCL 5 MG PO TABS: 5.0000 mg | ORAL_TABLET | Freq: Once | ORAL | Status: DC | PRN

## 2014-01-06 MED ORDER — GLYCOPYRROLATE 0.2 MG/ML IJ SOLN
INTRAMUSCULAR | Status: DC | PRN
  Administered 2014-01-06: 0.4 mg via INTRAVENOUS

## 2014-01-06 MED ORDER — PROMETHAZINE HCL 25 MG/ML IJ SOLN: 6.2500 mg | INTRAMUSCULAR | Status: DC | PRN

## 2014-01-06 MED ORDER — MIDAZOLAM HCL 2 MG/2ML IJ SOLN
INTRAMUSCULAR | Status: AC
Start: 2014-01-06 — End: 2014-01-06
  Filled 2014-01-06: qty 2

## 2014-01-06 MED ORDER — LACTATED RINGERS IV SOLN
INTRAVENOUS | Status: DC | PRN
  Administered 2014-01-06: 07:00:00 via INTRAVENOUS

## 2014-01-06 MED ORDER — FENTANYL CITRATE 0.05 MG/ML IJ SOLN
INTRAMUSCULAR | Status: AC
  Filled 2014-01-06: qty 5

## 2014-01-06 MED ORDER — SUCCINYLCHOLINE CHLORIDE 20 MG/ML IJ SOLN
INTRAMUSCULAR | Status: AC
  Filled 2014-01-06: qty 1

## 2014-01-06 MED ORDER — ONDANSETRON HCL 4 MG/2ML IJ SOLN
INTRAMUSCULAR | Status: AC
Start: 2014-01-06 — End: 2014-01-06
  Filled 2014-01-06: qty 2

## 2014-01-06 MED ORDER — OXYCODONE HCL 5 MG/5ML PO SOLN: 5.0000 mg | Freq: Once | ORAL | Status: DC | PRN

## 2014-01-06 MED ORDER — ROCURONIUM BROMIDE 100 MG/10ML IV SOLN
INTRAVENOUS | Status: DC | PRN
  Administered 2014-01-06: 35 mg via INTRAVENOUS

## 2014-01-06 MED ORDER — DEXTROSE 5 % IV SOLN
10.0000 mg | INTRAVENOUS | Status: DC | PRN
  Administered 2014-01-06: 10 ug/min via INTRAVENOUS

## 2014-01-06 MED ORDER — NEOSTIGMINE METHYLSULFATE 10 MG/10ML IV SOLN
INTRAVENOUS | Status: DC | PRN
  Administered 2014-01-06: 3 mg via INTRAVENOUS

## 2014-01-06 MED ORDER — DIPHENHYDRAMINE HCL 50 MG/ML IJ SOLN
INTRAMUSCULAR | Status: DC | PRN
  Administered 2014-01-06: 6.25 mg via INTRAVENOUS

## 2014-01-06 MED ORDER — ROCURONIUM BROMIDE 50 MG/5ML IV SOLN
INTRAVENOUS | Status: AC
  Filled 2014-01-06: qty 1

## 2014-01-06 MED ORDER — EPHEDRINE SULFATE 50 MG/ML IJ SOLN
INTRAMUSCULAR | Status: AC
  Filled 2014-01-06: qty 1

## 2014-01-06 MED ORDER — LIDOCAINE HCL 4 % MT SOLN
OROMUCOSAL | Status: DC | PRN
  Administered 2014-01-06: 4 mL via TOPICAL

## 2014-01-06 SURGICAL SUPPLY — 43 items
BRUSH CYTOL CELLEBRITY 1.5X140 (MISCELLANEOUS) IMPLANT
BRUSH SUPERTRAX BIOPSY (INSTRUMENTS) ×3 IMPLANT
BRUSH SUPERTRAX NDL-TIP CYTO (INSTRUMENTS) ×6 IMPLANT
CANISTER SUCTION 2500CC (MISCELLANEOUS) ×6 IMPLANT
CHANNEL WORK EXTEND EDGE 180 (KITS) IMPLANT
CHANNEL WORK EXTEND EDGE 45 (KITS) IMPLANT
CHANNEL WORK EXTEND EDGE 90 (KITS) IMPLANT
CONT SPEC 4OZ CLIKSEAL STRL BL (MISCELLANEOUS) ×12 IMPLANT
COTTONBALL LRG STERILE PKG (GAUZE/BANDAGES/DRESSINGS) IMPLANT
COVER TABLE BACK 60X90 (DRAPES) ×6 IMPLANT
FILTER STRAW FLUID ASPIR (MISCELLANEOUS) IMPLANT
FORCEPS BIOP RJ4 1.8 (CUTTING FORCEPS) IMPLANT
FORCEPS BIOP SUPERTRX PREMAR (INSTRUMENTS) ×3 IMPLANT
GAUZE SPONGE 4X4 12PLY STRL (GAUZE/BANDAGES/DRESSINGS) ×6 IMPLANT
GLOVE BIOGEL M 6.5 STRL (GLOVE) ×3 IMPLANT
GLOVE SURG SIGNA 7.5 PF LTX (GLOVE) ×3 IMPLANT
GOWN STRL REUS W/ TWL XL LVL3 (GOWN DISPOSABLE) ×2 IMPLANT
GOWN STRL REUS W/TWL XL LVL3 (GOWN DISPOSABLE) ×4
KIT CLEAN ENDO COMPLIANCE (KITS) ×6 IMPLANT
KIT PROCEDURE EDGE 180 (KITS) IMPLANT
KIT PROCEDURE EDGE 45 (KITS) IMPLANT
KIT PROCEDURE EDGE 90 (KITS) ×3 IMPLANT
KIT ROOM TURNOVER OR (KITS) ×6 IMPLANT
MARKER SKIN DUAL TIP RULER LAB (MISCELLANEOUS) ×6 IMPLANT
NEEDLE 22X1 1/2 (OR ONLY) (NEEDLE) IMPLANT
NEEDLE BIOPSY TRANSBRONCH 21G (NEEDLE) IMPLANT
NEEDLE BLUNT 18X1 FOR OR ONLY (NEEDLE) IMPLANT
NEEDLE SUPERTRX PREMARK BIOPSY (NEEDLE) ×3 IMPLANT
NEEDLE SYS SONOTIP II EBUSTBNA (NEEDLE) ×6 IMPLANT
NS IRRIG 1000ML POUR BTL (IV SOLUTION) ×6 IMPLANT
OIL SILICONE PENTAX (PARTS (SERVICE/REPAIRS)) ×6 IMPLANT
PAD ARMBOARD 7.5X6 YLW CONV (MISCELLANEOUS) ×9 IMPLANT
PATCHES PATIENT (LABEL) ×9 IMPLANT
SYR 20CC LL (SYRINGE) ×6 IMPLANT
SYR 20ML ECCENTRIC (SYRINGE) ×6 IMPLANT
SYR 30ML LL (SYRINGE) ×3 IMPLANT
SYR 5ML LL (SYRINGE) ×6 IMPLANT
SYR 5ML LUER SLIP (SYRINGE) ×3 IMPLANT
SYR CONTROL 10ML LL (SYRINGE) IMPLANT
TOWEL OR 17X24 6PK STRL BLUE (TOWEL DISPOSABLE) ×3 IMPLANT
TRAP SPECIMEN MUCOUS 40CC (MISCELLANEOUS) ×6 IMPLANT
TUBE CONNECTING 12'X1/4 (SUCTIONS) ×2
TUBE CONNECTING 12X1/4 (SUCTIONS) ×4 IMPLANT

## 2014-01-06 NOTE — Brief Op Note (Signed)
01/06/2014  10:11 AM  PATIENT:  Marguarite Arbour  80 y.o. female  PRE-OPERATIVE DIAGNOSIS:  Right Upper lobe lung nodule  POST-OPERATIVE DIAGNOSIS:  Right Upper lobe lung nodule  PROCEDURE:  Procedure(s): VIDEO BRONCHOSCOPY WITH ENDOBRONCHIAL NAVIGATION (N/A) VIDEO BRONCHOSCOPY WITH ENDOBRONCHIAL ULTRASOUND (N/A)  SURGEON:  Surgeon(s) and Role:    * [name], MD - Primary   ANESTHESIA:   general  EBL:  Total I/O In: 800 [I.V.:800] Out: -   BLOOD ADMINISTERED:none  DRAINS: none   LOCAL MEDICATIONS USED:  NONE  SPECIMEN:  Source of Specimen:  4R and 7 lymph nodes, RUL nodule, LLL nodule  DISPOSITION OF SPECIMEN:  PATHOLOGY  PLAN OF CARE: Discharge to home after PACU  PATIENT DISPOSITION:  PACU - hemodynamically stable.   Delay start of Pharmacological VTE agent (>24hrs) due to surgical blood loss or risk of bleeding: not applicable  FINDINGS: 4R and 7 nodes benign/ RUL nodule- atypical cells

## 2014-01-06 NOTE — Anesthesia Procedure Notes (Signed)
Procedure Name: Intubation Date/Time: 01/06/2014 7:43 AM Performed by: Ned Grace Pre-anesthesia Checklist: Patient identified, Patient being monitored, Emergency Drugs available, Timeout performed and Suction available Patient Re-evaluated:Patient Re-evaluated prior to inductionOxygen Delivery Method: Circle system utilized Preoxygenation: Pre-oxygenation with 100% oxygen Intubation Type: IV induction Ventilation: Mask ventilation without difficulty Grade View: Grade I Tube type: Oral Tube size: 8.5 mm Number of attempts: 1 Airway Equipment and Method: LTA kit utilized and Stylet Placement Confirmation: ETT inserted through vocal cords under direct vision,  breath sounds checked- equal and bilateral and positive ETCO2 Secured at: 23 cm Tube secured with: Tape Dental Injury: Teeth and Oropharynx as per pre-operative assessment

## 2014-01-06 NOTE — Transfer of Care (Signed)
Immediate Anesthesia Transfer of Care Note  Patient: Marissa [name]  Procedure(s) Performed: Procedure(s): VIDEO BRONCHOSCOPY WITH ENDOBRONCHIAL NAVIGATION (N/A) VIDEO BRONCHOSCOPY WITH ENDOBRONCHIAL ULTRASOUND (N/A)  Patient Location: PACU  Anesthesia Type:General  Level of Consciousness: awake, alert , oriented and patient cooperative  Airway & Oxygen Therapy: Patient Spontanous Breathing and Patient connected to nasal cannula oxygen  Post-op Assessment: Report given to PACU RN, Post -op Vital signs reviewed and stable and Patient moving all extremities  Post vital signs: Reviewed and stable  Complications: No apparent anesthesia complications

## 2014-01-06 NOTE — Anesthesia Postprocedure Evaluation (Signed)
Anesthesia Post Note  Patient: Marissa [name]  Procedure(s) Performed: Procedure(s) (LRB): VIDEO BRONCHOSCOPY WITH ENDOBRONCHIAL NAVIGATION (N/A) VIDEO BRONCHOSCOPY WITH ENDOBRONCHIAL ULTRASOUND (N/A)  Anesthesia type: general  Patient location: PACU  Post pain: Pain level controlled  Post assessment: Patient's Cardiovascular Status Stable  Last Vitals:  Filed Vitals:   01/06/14 1030  BP: 115/54  Pulse: 57  Temp:   Resp: 15    Post vital signs: Reviewed and stable  Level of consciousness: sedated  Complications: No apparent anesthesia complications

## 2014-01-06 NOTE — H&P (View-Only) (Signed)
PCP is [name], MD Referring Provider is Gaynelle Arabian, MD  Chief Complaint  Patient presents with  . NEW THORACIC    CONSULT    HPI: 80 year old woman sent for consultation regarding a right upper lobe nodule.  Ms. Marissa [name] is a 80 year old woman with a complex medical history which includes coronary bypass grafting and aortic valve replacement in 2008, redo bypass grafting and mitral valve replacement in 2013, congestive heart failure, rheumatic heart disease, atrial fibrillation, pulmonary hypertension, and COPD.  She recently was hospitalized with congestive heart failure manifested by shortness of breath and orthopnea. According to her daughter she was found to have fluid around her lungs and her heart. During the workup she had a chest x-ray which showed a right upper lobe nodule. A CT scan was done which confirmed a 1.6 cm spiculated nodule in the right upper lobe. PET CT then showed the lesion to be hypermetabolic with an SUV max of 5.14. There was no hypermetabolic hilar or mediastinal adenopathy.  She says that she gets short of breath with exertion, but is very cognizant of keeping her pace slow. As long as she walks slowly she can get up a flight of stairs. She denies any recent weight loss. She has not had any recent unusual headaches or visual changes. She has an occasional cough, but no hemoptysis. She says that she does wheeze occasionally, but she is good about taking her inhalers as needed.  ECOG/ZUBROD= 1   Past Medical History  Diagnosis Date  . HTN (hypertension)   . Hypercholesteremia   . CAD (coronary artery disease)     s/p CABG 03/2011  . Aortic valve disorders   . RLS (restless legs syndrome)   . A-fib   . Osteopenia   . Mitral valve disease     s/p MVR 03/2011 w/ 27 mm porcine valve  . Hypothyroidism   . COPD (chronic obstructive pulmonary disease)   . CHF (congestive heart failure)   . Aortic stenosis     (s/p AVR w/ 21 mm porcine valve 03/2011   . Mastoiditis     Past Surgical History  Procedure Laterality Date  . Coronary artery bypass graft    . Mitral valve replacement    . Laparoscopic cholecystectomy    . Aortic valve replacement      Family History  Problem Relation Age of Onset  . Adopted: Yes   she is not aware of any major illnesses in the family. Specifically no history of heart disease or COPD or lung cancer.  Social History History  Substance Use Topics  . Smoking status: Former Research scientist (life sciences)  . Smokeless tobacco: Not on file  . Alcohol Use: Not on file   smoked less than a pack a day for about 30 years. 15-pack-year history, quit many years ago.  Current Outpatient Prescriptions  Medication Sig Dispense Refill  . aspirin EC 81 MG tablet Take 1 tablet (81 mg total) by mouth daily.      [name] atorvastatin (LIPITOR) 40 MG tablet Take 40 mg by mouth daily.       . Calcium-Magnesium-Vitamin D (CALCIUM 500 PO) Take 1 tablet by mouth 2 (two) times daily.      . carvedilol (COREG) 6.25 MG tablet Take 1 tablet (6.25 mg total) by mouth 2 (two) times daily with a meal.  180 tablet  1  . digoxin (LANOXIN) 0.125 MG tablet Take by mouth daily.      [name] esomeprazole (NEXIUM) 40 MG capsule Take  40 mg by mouth daily at 12 noon.      . furosemide (LASIX) 40 MG tablet Take one (1) tab in the am and half (1/2) in the pm      . Ipratropium-Albuterol (COMBIVENT) 20-100 MCG/ACT AERS respimat Inhale 1 puff into the lungs every 6 (six) hours.      [name] ipratropium-albuterol (DUONEB) 0.5-2.5 (3) MG/3ML SOLN Take 3 mLs by nebulization.      [name] levothyroxine (SYNTHROID, LEVOTHROID) 100 MCG tablet Take 100 mcg by mouth daily before breakfast.       . potassium chloride SA (K-DUR,KLOR-CON) 20 MEQ tablet Take 20 mEq by mouth once.       [name] rOPINIRole (REQUIP) 4 MG tablet Take 4 mg by mouth at bedtime.      . vitamin C (ASCORBIC ACID) 500 MG tablet Take 500 mg by mouth daily.       No current facility-administered medications for this visit.     Allergies  Allergen Reactions  . Prozac [Fluoxetine Hcl]     Review of Systems  Constitutional: Negative for appetite change and unexpected weight change.  HENT: Positive for hearing loss.   Respiratory: Positive for shortness of breath.   Cardiovascular: Positive for leg swelling. Negative for chest pain.       Orthopnea  Hematological: Negative for adenopathy. Bruises/bleeds easily.  All other systems reviewed and are negative.   BP 151/79  Pulse 60  Ht 5\' 2"  (1.575 m)  Wt 145 lb (65.772 kg)  BMI 26.51 kg/m2  SpO2 92% Physical Exam  Vitals reviewed. Constitutional: She is oriented to person, place, and time. She appears well-developed and well-nourished. No distress.  HENT:  Head: Normocephalic and atraumatic.  Eyes: EOM are normal.  Neck: Neck supple. No thyromegaly present.  Cardiovascular: Normal rate and regular rhythm.   Murmur (3/6 systolic) heard. Pulmonary/Chest: She has wheezes (faint bilateral).  Well healed sternotomy scar  Abdominal: Soft. There is no tenderness.  Musculoskeletal: She exhibits no edema.  Lymphadenopathy:    She has no cervical adenopathy.  Neurological: She is alert and oriented to person, place, and time. No cranial nerve deficit.  Skin: Skin is warm and dry.     Diagnostic Tests: CT CHEST WITHOUT CONTRAST  TECHNIQUE:  Multidetector CT imaging of the chest was performed following the  standard protocol without IV contrast.  COMPARISON: 11/03/2013  FINDINGS:  Right proximal humeral deformity and advanced degenerative  glenohumeral arthropathy on the right.  Aortic atherosclerosis. Prior CABG. Prior mitral and aortic valve  prostheses. Enlarged left atrium. Adrenal glands unremarkable.  Centrilobular emphysema. Marginally spiculated at 1.6 by 1.4 cm  right upper lobe pulmonary nodule, image 18 series 4, corresponding  to the density at chest radiography.  Bandlike scarring or atelectasis in the right lower lobe with some   associated punctate calcification. Slightly lobular pleural adipose  tissue along the right hemidiaphragm.  Abnormal triangular 3.5 x 1.9 cm density in the left lower lobe with  associated calcification and volume loss, probably chronic  atelectasis or scarring, less likely to harbor a malignancy. I  measured this on image 38 of series 4.  Thoracic spondylosis.  3 x 2 mm subpleural nodule, left upper lobe, image 24 series 4.  IMPRESSION:  1. Marginally spiculated 1.6 x 1.4 cm right upper lobe pulmonary  nodule, persistent over the past month. Appearance highly concerning  for bronchogenic carcinoma. Tissue diagnosis and/or nuclear medicine  PET-CT recommended.  2. Triangular 3.5 x 1.9 cm left lower  lobe airspace opacity with  associated volume loss and calcification, probably from scarring or  chronic atelectasis, less likely to harbor malignancy. This could  also be assessed further at PET-CT.  3. Bandlike scarring in the right lower lobe.  4. Centrilobular emphysema.  5. Atherosclerosis and enlarged left atrium. Prior valve prostheses  and CABG.  6. 3 mm subpleural nodule in the left upper lobe, is probably benign  but merit observation on followup studies.  These results will be called to the ordering clinician or  representative by the Radiologist Assistant, and communication  documented in the PACS or zVision Dashboard.  Electronically Signed  By: Sherryl Barters M.D.  On: 12/02/2013 10:12   NUCLEAR MEDICINE PET SKULL BASE TO THIGH  TECHNIQUE:  7.3 mCi F-18 FDG was injected intravenously. Full-ring PET imaging  was performed from the skull base to thigh after the radiotracer. CT  data was obtained and used for attenuation correction and anatomic  localization.  FASTING BLOOD GLUCOSE: Value: 140 mg/dl  COMPARISON: CT 12/02/2013  FINDINGS:  NECK  No hypermetabolic lymph nodes in the neck.  CHEST  Within the right upper lobe there is a noncalcified nodule measuring  16  mm (image 57, series 4) with spiculated margin and metabolic  activity with SUV max equal 5.1. No additional hypermetabolic  nodules. There is atelectasis at the left and right lung base. There  are no hypermetabolic mediastinal lymph nodes.  Diffuse hypermetabolic activity associated with thyroid gland.  ABDOMEN/PELVIS  No abnormal hypermetabolic activity within the liver, pancreas,  adrenal glands, or spleen. No hypermetabolic lymph nodes in the  abdomen or pelvis. Low-density cyst associated with the right kidney  without metabolic activity. The lymphadenectomy clips in the pelvis.  SKELETON  No focal hypermetabolic activity to suggest skeletal metastasis.  IMPRESSION:  1. Hypermetabolic right upper lobe pulmonary nodule without evidence  of mediastinal metastasis or distant metastasis.  2. Staging by FDG PET-CT imaging T1a N0 M0 (if non-small cell lung  cancer as expected). Recommend thoracic surgery consultation.  3. Diffuse metabolic activity associated with the thyroid gland  consistent with thyroiditis.  These results will be called to the ordering clinician or  representative by the Radiologist Assistant, and communication  documented in the PACS or zVision Dashboard.  Electronically Signed  By: Suzy Bouchard M.D.  On: 12/16/2013 11:59   PULMONARY FUNCTION TESTS FVC 1.87(81%) 2.04(88%) post bronchodilator FEV1  1.09(63%) 1.23(71%) FEV1/ FVC= 58% DLCO= 7.62(37%)  Impression: 80 year old woman with multiple medical problems who has a hypermetabolic spiculated nodule in the right upper lobe. This is highly suspicious for a stage IA non-small cell carcinoma. She does not have a heavy smoking history, but given the any smoking history in her age group this most likely is a cancer. Other possibilities include AFB or fungal infection. There is some scarring in the left base that was not hypermetabolic on PET.  Ms. Mowers has a complex cardiac history with coronary disease,  rheumatic mitral stenosis, and aortic stenosis. She has had CABG, aVR with a tissue valve in 2008. She then had a redo CABG and mitral valve replacement with a tissue valve in 2013. She has known pulmonary hypertension. A recent echocardiogram showed good left ventricular systolic function, but an elevated LVEDP and moderate pulmonary hypertension with an estimated RV systolic of 53 mmHg.  She also has some significant pulmonary disease. Her flows are diminished but not prohibitive. However her diffusion capacity is severely impaired at 37% predicted.  Neither her cardiac or  pulmonary issues are absolute contraindications to surgical resection, although the DLCO is close to being prohibitive. However when all of these issues are taken into account I think that she would be an extremely high-risk surgical candidate.  In my opinion a better option for her would be SBRT. Although the chance of cure would be less than a lobectomy, I do not think she would tolerate a lobectomy or a physiologic standpoint. The likelihood of cure probably approaches that of a more limited resection, without the high operative risk given her cardiac and pulmonary issues.  I recommended to her that we do an electromagnetic navigational bronchoscopy and endobronchial ultrasound for diagnostic and staging purposes. I discussed the general nature of the procedure with the patient and her daughter. They understand the need for general anesthesia and the endoscopic nature of the procedure. They understand that no guarantee can be given that there will be definitively diagnostic. They understand the risk of procedure include those related general anesthesia as well as bleeding, pneumothorax, nondiagnostic biopsies, as well as the possibility of unforeseeable complications.  She agrees to proceed.  Plan: Electromagnetic navigational bronchoscopy and endobronchial ultrasound on Monday, 01/06/2014

## 2014-01-06 NOTE — Discharge Instructions (Signed)
What to eat:  For your first meals, you should eat lightly; only small meals initially.  If you do not have nausea, you may eat larger meals.  Avoid spicy, greasy and heavy food.    General Anesthesia, Adult, Care After  Refer to this sheet in the next few weeks. These instructions provide you with information on caring for yourself after your procedure. Your health care provider may also give you more specific instructions. Your treatment has been planned according to current medical practices, but problems sometimes occur. Call your health care provider if you have any problems or questions after your procedure.  WHAT TO EXPECT AFTER THE PROCEDURE  After the procedure, it is typical to experience:  Sleepiness.  Nausea and vomiting. HOME CARE INSTRUCTIONS  For the first 24 hours after general anesthesia:  Have a responsible person with you.  Do not drive a car. If you are alone, do not take public transportation.  Do not drink alcohol.  Do not take medicine that has not been prescribed by your health care provider.  Do not sign important papers or make important decisions.  You may resume a normal diet and activities as directed by your health care provider.  Change bandages (dressings) as directed.  If you have questions or problems that seem related to general anesthesia, call the hospital and ask for the anesthetist or anesthesiologist on call. SEEK MEDICAL CARE IF:  You have nausea and vomiting that continue the day after anesthesia.  You develop a rash. SEEK IMMEDIATE MEDICAL CARE IF:  You have difficulty breathing.  You have chest pain.  You have any allergic problems. Document Released: 06/13/2000 Document Revised: 11/07/2012 Document Reviewed: 09/20/2012  [facility] Patient Information 2014 [place], [place].   Do not drive or engage in heavy physical activity for 24 hours  You will probably cough up small amounts of blood over the next few days  Call [phone] if you develop  chest pain, shortness of breath, fever > 101F, or cough up large amounts of blood  My office will contact you with follow up information

## 2014-01-06 NOTE — Interval H&P Note (Signed)
History and Physical Interval Note:  01/06/2014 7:24 AM  Marguarite Arbour  has presented today for surgery, with the diagnosis of Right Upper lobe lung nodule  The various methods of treatment have been discussed with the patient and family. After consideration of risks, benefits and other options for treatment, the patient has consented to  Procedure(s): VIDEO BRONCHOSCOPY WITH ENDOBRONCHIAL NAVIGATION (N/A) VIDEO BRONCHOSCOPY WITH ENDOBRONCHIAL ULTRASOUND (N/A) as a surgical intervention .  The patient's history has been reviewed, patient examined, no change in status, stable for surgery.  I have reviewed the patient's chart and labs.  Questions were answered to the patient's satisfaction.     HENDRICKSON,STEVEN C

## 2014-01-06 NOTE — Anesthesia Preprocedure Evaluation (Addendum)
Anesthesia Evaluation  Patient identified by MRN, date of birth, ID band Patient awake    Reviewed: Allergy & Precautions, H&P , NPO status , Patient's Chart, lab work & pertinent test results  History of Anesthesia Complications (+) history of anesthetic complications  Airway Mallampati: II TM Distance: >3 FB Neck ROM: Full    Dental  (+) Poor Dentition, Dental Advisory Given   Pulmonary COPD COPD inhaler, former smoker,    Pulmonary exam normal       Cardiovascular hypertension, + CAD, + Past MI and + CABG + dysrhythmias Atrial Fibrillation + Valvular Problems/Murmurs Rhythm:Regular Rate:Normal + Systolic murmurs S/p AVR and MVR. EF 60% on echo 10/2013   Neuro/Psych negative neurological ROS  negative psych ROS   GI/Hepatic Neg liver ROS, GERD-  Medicated,  Endo/Other  Hypothyroidism   Renal/GU negative Renal ROS     Musculoskeletal   Abdominal   Peds  Hematology   Anesthesia Other Findings   Reproductive/Obstetrics                         Anesthesia Physical Anesthesia Plan  ASA: III  Anesthesia Plan: General   Post-op Pain Management:    Induction: Intravenous  Airway Management Planned: Oral ETT  Additional Equipment:   Intra-op Plan:   Post-operative Plan: Extubation in OR  Informed Consent: I have reviewed the patients History and Physical, chart, labs and discussed the procedure including the risks, benefits and alternatives for the proposed anesthesia with the patient or authorized representative who has indicated his/her understanding and acceptance.   Dental advisory given  Plan Discussed with: CRNA, Anesthesiologist and Surgeon  Anesthesia Plan Comments:         Anesthesia Quick Evaluation

## 2014-01-07 ENCOUNTER — Encounter (HOSPITAL_COMMUNITY): Payer: Self-pay | Admitting: Thoracic Surgery (Cardiothoracic Vascular Surgery)

## 2014-01-07 NOTE — Op Note (Signed)
[name], Marissa [name] NO.:  [id number]  MEDICAL RECORD NO.:  [id number]  LOCATION:  [facility]                         FACILITY:  [facility]  PHYSICIAN:  Revonda Standard. Roxan Hockey, M.D.DATE OF BIRTH:  1933-12-04  DATE OF PROCEDURE:  01/06/2014 DATE OF DISCHARGE:  01/06/2014                              OPERATIVE REPORT   PREOPERATIVE DIAGNOSIS:  Right upper lobe nodule and left lower lobe infiltrate.  POSTOPERATIVE DIAGNOSIS:  Right upper lobe nodule and left lower lobe infiltrate.  PROCEDURE: 1. Electromagnetic navigational bronchoscopy with brushings, biopsies,     needle aspirations, and bronchoalveolar lavage. 2. Endobronchial ultrasound with mediastinal lymph node aspirations.  SURGEON:  Revonda Standard. Roxan Hockey, M.D.  ASSISTANT:  None.  ANESTHESIA:  General.  FINDINGS:  Initial brushings from right upper lobe nodule revealed atypical cells.  Remaining specimens sent for permanent pathology. Aspirations of level 4R and 7 lymph nodes revealed lymphocytes, but no tumor was seen.  CLINICAL NOTE:  Ms.  Marissa [name] is a 80 year old woman with a complex medical history who recently was hospitalized with congestive heart failure.  During that hospitalization, she was found to have pleural effusions.  A chest x-ray showed a right upper lobe nodule. A  CT was done which confirmed a 1.6 cm spiculated nodule in the right upper lobe. This was hypermetabolic on PET.  There were some mediastinal nodes that were at the upper limit of normal in size, but were not hypermetabolic. There also was an area of consolidation in the left lower lobe that was not hypermetabolic.  She was advised to undergo navigational bronchoscopy and endobronchial ultrasound for diagnostic purposes.  The indications risks, benefits, and alternatives were discussed in detail with the patient.  She understood and accepted the risks and agreed to proceed.  OPERATIVE NOTE:  Ms. Marissa [name] was brought to the  operating room on January 06, 2014.  She had induction of general anesthesia and was intubated.  Flexible fiberoptic bronchoscopy was performed via the endotracheal tube.  The tube was noted to be in the right mainstem.  It was withdrawn back into the trachea.  There were some clear foamy secretions. These were evacuated. The trachea and bronchi were normal with no endobronchial lesions to the level of the subsegmental bronchi. The bronchoscope was withdrawn.  The endobronchial ultrasound probe then was advanced and inspection of the mediastinal nodes was performed. Nodes were identified in the 4R and 7 locations. Aspirations were performed on each of these nodes. 2 sets of aspirations were performed on each node. The needle was advanced into the node with ultrasound guidance.  Suction was applied and 10-12 passes of the needle through the node were performed. A portion of the specimen was sent for immediate examination.  The remainder were sent for permanent cytology.  There were other smaller nodes visible but without good windows for aspiration. The endobronchial ultrasound probe was removed.  The bronchoscope was reinserted and the locatable guide for navigation was inserted through the bronchoscope.  Mapping was performed and there was good correlation of the virtual and video bronchoscopies. The scope then was navigated to the origin of the right upper lobe and the sheath was advanced with navigational guidance to within  1.5 cm of the lesion. The locatable guide was removed.  The sheath was left in place. Multiple brushings then were performed and these were placed on slides. The node aspirations and the initial brushings from the right upper lobe were sent for pathologic examination.  While awaiting those results, multiple biopsies were obtained.  Needle aspirations were performed.  These were sent for permanent cytology only. Bronchoalveolar lavage then was performed.  The  sheath then was redirected to the consolidation/infiltrate in the left lower lobe.  Biopsies and brushings were obtained from this area. New brushes and biopsy forceps were used. Bronchoalveolar lavage was performed as well.  A portion of the BAL specimen was sent for cultures in addition to cytology.  At this point, the initial quick preparations of the mediastinal lymph nodes showed benign lymphocytes. Atypical cells were noted on the right upper lobe biopsy.  We will await final pathology.  The patient was extubated in the operating room, and taken to postanesthetic care unit in good condition.     Revonda Standard Roxan Hockey, M.D.     SCH/MEDQ  D:  01/06/2014  T:  01/07/2014  Job:  [id number]

## 2014-01-08 LAB — CULTURE, RESPIRATORY: Culture: NO GROWTH

## 2014-01-08 LAB — CULTURE, RESPIRATORY W GRAM STAIN

## 2014-01-13 ENCOUNTER — Ambulatory Visit (INDEPENDENT_AMBULATORY_CARE_PROVIDER_SITE_OTHER): Payer: Medicare Other | Admitting: Thoracic Surgery (Cardiothoracic Vascular Surgery)

## 2014-01-13 ENCOUNTER — Telehealth: Payer: Self-pay | Admitting: *Deleted

## 2014-01-13 ENCOUNTER — Encounter: Payer: Self-pay | Admitting: Thoracic Surgery (Cardiothoracic Vascular Surgery)

## 2014-01-13 VITALS — BP 148/66 | HR 64 | Ht 61.0 in | Wt 146.0 lb

## 2014-01-13 DIAGNOSIS — J432 Centrilobular emphysema: Secondary | ICD-10-CM | POA: Diagnosis not present

## 2014-01-13 DIAGNOSIS — R918 Other nonspecific abnormal finding of lung field: Secondary | ICD-10-CM | POA: Diagnosis not present

## 2014-01-13 DIAGNOSIS — I2510 Atherosclerotic heart disease of native coronary artery without angina pectoris: Secondary | ICD-10-CM

## 2014-01-13 NOTE — Telephone Encounter (Signed)
I was going to call patient with appt with Dr. Tammi Klippel on 01/27/14 but note in EPIC states not to call after 11 am.  I will call tomorrow with appt

## 2014-01-13 NOTE — Progress Notes (Signed)
HPI:  Marissa [name] returns today for follow-up after her recent navigational bronchoscopy and endobronchial ultrasound.  Remote history of tobacco abuse and a complex cardiac history. She also has COPD with FEV1 of 1.09 (63%) and a DLCO of 37%. She has a spiculated nodule in the right upper lobe that is hypermetabolic with an SUV of 7.85 on PET/CT. She had some borderline lymph nodes that were not hypermetabolic on PET.   We did never used for bronchoscopy and EBUS on 01/06/2014. She tolerated the procedure well and on the day of surgery.  Past Medical History  Diagnosis Date  . HTN (hypertension)   . Hypercholesteremia   . CAD (coronary artery disease)     s/p CABG 03/2011  . Aortic valve disorders   . RLS (restless legs syndrome)   . A-fib   . Osteopenia   . Mitral valve disease     s/p MVR 03/2011 w/ 27 mm porcine valve  . Hypothyroidism   . COPD (chronic obstructive pulmonary disease)   . CHF (congestive heart failure)   . Aortic stenosis     (s/p AVR w/ 21 mm porcine valve 03/2011  . Mastoiditis   . Myocardial infarction     "slight"  . Dysrhythmia   . Shortness of breath     with exertion  . Pre-diabetes   . GERD (gastroesophageal reflux disease)     takes nexium prn  . Arthritis   . History of blood transfusion     Rheumatic Fever      Current Outpatient Prescriptions  Medication Sig Dispense Refill  . aspirin EC 81 MG tablet Take 1 tablet (81 mg total) by mouth daily.      [name] atorvastatin (LIPITOR) 40 MG tablet Take 40 mg by mouth daily.       . Calcium-Magnesium-Vitamin D (CALCIUM 500 PO) Take 1 tablet by mouth 2 (two) times daily.      . carvedilol (COREG) 6.25 MG tablet Take 1 tablet (6.25 mg total) by mouth 2 (two) times daily with a meal.  180 tablet  1  . digoxin (LANOXIN) 0.125 MG tablet Take 0.125 mg by mouth daily.       [name] esomeprazole (NEXIUM) 40 MG capsule Take 40 mg by mouth daily as needed (for acid reflux).       . fluticasone-salmeterol (ADVAIR  HFA) 115-21 MCG/ACT inhaler Inhale 1 puff into the lungs 2 (two) times daily.      . furosemide (LASIX) 40 MG tablet Take 20-40 mg by mouth 2 (two) times daily. Take 40 mg by mouth in the morning and take 20 mg by mouth in the evening.      . Ipratropium-Albuterol (COMBIVENT) 20-100 MCG/ACT AERS respimat Inhale 1 puff into the lungs every 6 (six) hours as needed for wheezing or shortness of breath.       [name] ipratropium-albuterol (DUONEB) 0.5-2.5 (3) MG/3ML SOLN Take 3 mLs by nebulization 2 (two) times daily as needed (for shortness of breath).       [name] levothyroxine (SYNTHROID, LEVOTHROID) 100 MCG tablet Take 100 mcg by mouth daily before breakfast.       . potassium chloride SA (K-DUR,KLOR-CON) 20 MEQ tablet Take 20 mEq by mouth daily.       [name] rOPINIRole (REQUIP) 4 MG tablet Take 4 mg by mouth at bedtime.      . vitamin C (ASCORBIC ACID) 500 MG tablet Take 500 mg by mouth daily.       No current facility-administered  medications for this visit.    Physical Exam BP 148/66  Pulse 64  Ht 5\' 1"  (1.549 m)  Wt 146 lb (66.225 kg)  BMI 27.60 kg/m2  SpO18 61%  80 year old woman in no acute distress Lungs diminished bilaterally, no wheezing  Diagnostic Tests:  FINAL DIAGNOSIS  Adequacy Reason Satisfactory For Evaluation. Diagnosis TRANSBRONCHIAL NEEDLE ASPIRATION NAVIGATION, BRUSHING RIGHT UPPER LOBE LUNG, (SPECIMEN 3 OF 8) ATYPICAL CELLS PRESENT, SUSPICIOUS FOR MALIGNANCY SEE COMMENT. COMMENT: THERE ARE ATYPICAL EPITHELIAL CELLS PRESENT THAT ARE SUSPICIOUS BUT NOT DIAGNOSTIC OF MALIGNANCY. Preliminary Diagnosis ATYPICAL CELLS (CR) [name] DO Pathologist, Electronic Signature (Case signed 01/07/2014)  Impression: 80 year old woman with a right upper lobe lung nodule that is hypermetabolic by PET. Bronchoscopic biopsy revealed atypical cells that were suspicious for malignancy, however they were not definitively diagnostic. I discussed this result with Marissa [name] and her daughter.  They understand that while not definitively diagnostic, given the clinical presentation, radiographic appearance, and finding of atypical cells this almost certainly is a non-small cell carcinoma. Based on scans it would appear to be a stage IA lesion.  She is not a good operative candidate. This lesion does appear to be amenable to stereotactic radiation. We discussed her case at our multidisciplinary thoracic oncology conference last week and the consensus was that stereotactic radiation was probably the best option. She agrees to meet with Radiation Oncology.  Plan: Refer to radiation oncology for possible stereotactic radiation of right upper lobe nodule

## 2014-01-14 ENCOUNTER — Telehealth: Payer: Self-pay | Admitting: *Deleted

## 2014-01-14 NOTE — Telephone Encounter (Signed)
Called and spoke with daughter regarding patients appt with Dr. Tammi Klippel on 01/27/14 at 2:45.  She verbalized understanding of appt time and place.

## 2014-01-22 ENCOUNTER — Encounter: Payer: Self-pay | Admitting: *Deleted

## 2014-01-22 NOTE — [facility] Oncology Navigator Note (Unsigned)
Received an in-basket message regarding referral for Rad Onc.  Patient is set up on 01/27/14 with Rad Onc.

## 2014-01-24 ENCOUNTER — Encounter: Payer: Self-pay | Admitting: Radiation Oncology

## 2014-01-24 NOTE — Progress Notes (Signed)
Thoracic Location of Tumor / Histology: right upper lobe lung nodule  Patient was hospitalized with congestive heart failure manifested by shortness of breath and orthopnea subsequent work up revealed nodule  Biopsies of revealed:   Tobacco/Marijuana/Snuff/ETOH use: former  Past/Anticipated interventions by cardiothoracic surgery, if any: yes, bronchoscopy and EBUS on 01/06/2014  Past/Anticipated interventions by medical oncology, if any:   Signs/Symptoms  Weight changes, if any: no  Respiratory complaints, if any: yes, SOB with exertion and occasional cough  Hemoptysis, if any: no  Pain issues, if any:  no  SAFETY ISSUES:  Prior radiation?   Pacemaker/ICD?    Possible current pregnancy?no  Is the patient on methotrexate? no  Current Complaints / other details:  80 year old female.

## 2014-01-27 ENCOUNTER — Ambulatory Visit
Admission: RE | Admit: 2014-01-27 | Discharge: 2014-01-27 | Disposition: A | Discharge status: Home / Self Care | Payer: Medicare Other | Source: Ambulatory Visit | Attending: Radiation Oncology | Admitting: Radiation Oncology

## 2014-01-27 ENCOUNTER — Encounter: Payer: Self-pay | Admitting: Radiation Oncology

## 2014-01-27 VITALS — BP 131/63 | HR 68 | Resp 16 | Wt 146.4 lb

## 2014-01-27 DIAGNOSIS — E039 Hypothyroidism, unspecified: Secondary | ICD-10-CM | POA: Insufficient documentation

## 2014-01-27 DIAGNOSIS — Z7982 Long term (current) use of aspirin: Secondary | ICD-10-CM | POA: Insufficient documentation

## 2014-01-27 DIAGNOSIS — J449 Chronic obstructive pulmonary disease, unspecified: Secondary | ICD-10-CM | POA: Diagnosis not present

## 2014-01-27 DIAGNOSIS — E780 Pure hypercholesterolemia: Secondary | ICD-10-CM | POA: Insufficient documentation

## 2014-01-27 DIAGNOSIS — Z79899 Other long term (current) drug therapy: Secondary | ICD-10-CM | POA: Insufficient documentation

## 2014-01-27 DIAGNOSIS — C3411 Malignant neoplasm of upper lobe, right bronchus or lung: Secondary | ICD-10-CM | POA: Diagnosis not present

## 2014-01-27 DIAGNOSIS — Z7951 Long term (current) use of inhaled steroids: Secondary | ICD-10-CM | POA: Diagnosis not present

## 2014-01-27 DIAGNOSIS — Z510 Encounter for antineoplastic radiation therapy: Secondary | ICD-10-CM | POA: Insufficient documentation

## 2014-01-27 DIAGNOSIS — I509 Heart failure, unspecified: Secondary | ICD-10-CM | POA: Diagnosis not present

## 2014-01-27 DIAGNOSIS — I10 Essential (primary) hypertension: Secondary | ICD-10-CM | POA: Insufficient documentation

## 2014-01-27 DIAGNOSIS — K219 Gastro-esophageal reflux disease without esophagitis: Secondary | ICD-10-CM | POA: Diagnosis not present

## 2014-01-27 DIAGNOSIS — Z87891 Personal history of nicotine dependence: Secondary | ICD-10-CM | POA: Insufficient documentation

## 2014-01-27 DIAGNOSIS — Z951 Presence of aortocoronary bypass graft: Secondary | ICD-10-CM | POA: Insufficient documentation

## 2014-01-27 DIAGNOSIS — Z952 Presence of prosthetic heart valve: Secondary | ICD-10-CM | POA: Insufficient documentation

## 2014-01-27 NOTE — Progress Notes (Signed)
Patient here today with her daughter of which she lives with. Patient denies weight loss. Reports occasional night sweats. Reports SOB with exertion. Reports a cough that is mostly dry. Reports her cough is sometimes productive in the morning with scant bright red blood. Denies pain. Hoarseness noted. Denies difficulty swallowing. Reports she occasionally feels off balance. Denies headache, nausea, vomiting, or diarrhea.

## 2014-01-27 NOTE — Progress Notes (Signed)
See progress note under physician encounter. 

## 2014-01-28 NOTE — Progress Notes (Signed)
Radiation Oncology         [phone]) [phone] ________________________________  Initial outpatient Consultation  Name: Marissa [name] MRN: [id number]  Date: 01/27/2014  DOB: Oct 04, 1933  QP:[id number],OIZTIW R, MD  [name], *   REFERRING PHYSICIAN: [name], *  DIAGNOSIS: 80 yo woman with stage T1a N0 M0 presumed non-small cell lung cancer - Stage IA    ICD-9-CM ICD-10-CM   1. Primary cancer of right upper lobe of lung 162.3 C34.11     HISTORY OF PRESENT ILLNESS::Marissa [name] is a 80 y.o. female who underwent chest x-ray for dyspnea on 11/03/13.  It showed a right upper lung nodule:    Chest CT was performed on 12/02/13.  The lung nodule was confirmed.     Consequently, PET_CT was done on 12/16/13.  The nodule had a malignant level SUV of over 5, with no evidence of malignant lymphadenopathy or distant metastases.    Bronch was performed on 01/06/14, and multiple specimens showed atypical cells consistent with malignancy, but the scant material prohibited a definitive diagnosis.  Further surgical intervention to obtain tissue or resect the lesion is not recommended given the patient's multiple medical comorbidities.  The patient has been referred for consideration of radiation treatment options.  PREVIOUS RADIATION THERAPY: No  PAST MEDICAL HISTORY:  has a past medical history of HTN (hypertension); Hypercholesteremia; CAD (coronary artery disease); Aortic valve disorders; RLS (restless legs syndrome); A-fib; Osteopenia; Mitral valve disease; Hypothyroidism; COPD (chronic obstructive pulmonary disease); CHF (congestive heart failure); Aortic stenosis; Mastoiditis; Myocardial infarction; Dysrhythmia; Shortness of breath; Pre-diabetes; GERD (gastroesophageal reflux disease); Arthritis; History of blood transfusion; and Lung cancer.    PAST SURGICAL HISTORY: Past Surgical History  Procedure Laterality Date  . Mitral valve replacement      1990's  .  Laparoscopic cholecystectomy    . Aortic valve replacement    . Tonsillectomy    . Coronary artery bypass graft  03/2011    1990's also  . Video bronchoscopy with endobronchial navigation N/A 01/06/2014    Procedure: VIDEO BRONCHOSCOPY WITH ENDOBRONCHIAL NAVIGATION;  Surgeon: [name], MD;  Location: [place];  Service: Thoracic;  Laterality: N/A;  . Video bronchoscopy with endobronchial ultrasound N/A 01/06/2014    Procedure: VIDEO BRONCHOSCOPY WITH ENDOBRONCHIAL ULTRASOUND;  Surgeon: [name], MD;  Location: [place];  Service: Thoracic;  Laterality: N/A;    FAMILY HISTORY: family history includes Cancer in her brother. She was adopted.  SOCIAL HISTORY:  reports that she has quit smoking. Her smoking use included Cigarettes. She has a 15 pack-year smoking history. She has never used smokeless tobacco. She reports that she does not drink alcohol or use illicit drugs.  ALLERGIES: Coumadin and Prozac  MEDICATIONS:  Current Outpatient Prescriptions  Medication Sig Dispense Refill  . aspirin EC 81 MG tablet Take 1 tablet (81 mg total) by mouth daily.    [name] atorvastatin (LIPITOR) 40 MG tablet Take 40 mg by mouth daily.     . budesonide-formoterol (SYMBICORT) 160-4.5 MCG/ACT inhaler Inhale 2 puffs into the lungs 2 (two) times daily.    . Calcium-Magnesium-Vitamin D (CALCIUM 500 PO) Take 1 tablet by mouth 2 (two) times daily.    . carvedilol (COREG) 6.25 MG tablet Take 1 tablet (6.25 mg total) by mouth 2 (two) times daily with a meal. 180 tablet 1  . digoxin (LANOXIN) 0.125 MG tablet Take 0.125 mg by mouth daily.     . fluticasone-salmeterol (ADVAIR HFA) 115-21 MCG/ACT inhaler Inhale 1  puff into the lungs 2 (two) times daily.    . furosemide (LASIX) 40 MG tablet Take 20-40 mg by mouth 2 (two) times daily. Take 40 mg by mouth in the morning and take 20 mg by mouth in the evening.    . Ipratropium-Albuterol (COMBIVENT) 20-100 MCG/ACT AERS respimat Inhale 1 puff into the lungs  every 6 (six) hours as needed for wheezing or shortness of breath.     [name] ipratropium-albuterol (DUONEB) 0.5-2.5 (3) MG/3ML SOLN Take 3 mLs by nebulization 2 (two) times daily as needed (for shortness of breath).     [name] levothyroxine (SYNTHROID, LEVOTHROID) 100 MCG tablet Take 100 mcg by mouth daily before breakfast.     . potassium chloride SA (K-DUR,KLOR-CON) 20 MEQ tablet Take 20 mEq by mouth daily.     [name] rOPINIRole (REQUIP) 4 MG tablet Take 4 mg by mouth at bedtime.    . vitamin C (ASCORBIC ACID) 500 MG tablet Take 500 mg by mouth daily.    [name] esomeprazole (NEXIUM) 40 MG capsule Take 40 mg by mouth daily as needed (for acid reflux).     [name] tiotropium (SPIRIVA) 18 MCG inhalation capsule Place 18 mcg into inhaler and inhale daily.     No current facility-administered medications for this encounter.    REVIEW OF SYSTEMS:  A 15 point review of systems is documented in the electronic medical record. This was obtained by the nursing staff. However, I reviewed this with the patient to discuss relevant findings and make appropriate changes.  A comprehensive review of systems was negative.   PHYSICAL EXAM:  weight is 146 lb 6.4 oz (66.407 kg). Her blood pressure is 131/63 and her pulse is 68. Her respiration is 16 and oxygen saturation is 94%.   The patient is in no acute distress.  respiratory effort is WNL.  Speech is fluent and articulate.  Mood and affect are appropriate.  Gait is normal.    KPS = 90  100 - Normal; no complaints; no evidence of disease. 90   - Able to carry on normal activity; minor signs or symptoms of disease. 80   - Normal activity with effort; some signs or symptoms of disease. 35   - Cares for self; unable to carry on normal activity or to do active work. 60   - Requires occasional assistance, but is able to care for most of his personal needs. 50   - Requires considerable assistance and frequent medical care. 31   - Disabled; requires special care and assistance. 46   -  Severely disabled; hospital admission is indicated although death not imminent. 56   - Very sick; hospital admission necessary; active supportive treatment necessary. 10   - Moribund; fatal processes progressing rapidly. 0     - Dead  Karnofsky DA, Abelmann [place], Craver LS and Burchenal [facility] [phone]) The use of the nitrogen mustards in the palliative treatment of carcinoma: with particular reference to bronchogenic carcinoma Cancer 1 634-56  LABORATORY DATA:  Lab Results  Component Value Date   WBC 10.9* 01/02/2014   HGB 13.9 01/02/2014   HCT 42.5 01/02/2014   MCV 88.2 01/02/2014   PLT 182 01/02/2014   Lab Results  Component Value Date   NA 141 01/02/2014   K 3.7 01/02/2014   CL 96 01/02/2014   CO2 29 01/02/2014   Lab Results  Component Value Date   ALT 15 01/02/2014   AST 16 01/02/2014   ALKPHOS 103 01/02/2014   BILITOT 0.9  01/02/2014     RADIOGRAPHY: Dg Chest 2 View  01/06/2014   CLINICAL DATA:  Preop for biopsy right lung. Emphysema and shortness of breath.  EXAM: CHEST  2 VIEW  COMPARISON:  CT chest 12/02/2013.  Chest 11/03/2013.  FINDINGS: Right upper lung nodule today measuring about 1.8 x 1.4 cm. This corresponds to lesion seen on previous chest CT and chest radiograph. No focal airspace disease in the lungs. Normal heart size and pulmonary vascularity. Calcified and tortuous aorta. Degenerative and postoperative changes in the right shoulder. Degenerative changes in the thoracic spine.  IMPRESSION: Indeterminate right upper lung pulmonary nodule again demonstrated. No evidence of active infiltration in the lungs.   Electronically Signed   By: Lucienne Capers M.D.   On: 01/06/2014 06:15   Dg C-arm Bronchoscopy  01/06/2014   CLINICAL DATA:    C-ARM BRONCHOSCOPY  Fluoroscopy was utilized by the requesting physician.  No radiographic  interpretation.       IMPRESSION: The patient is a very nice 80 yo woman with a stage T1a N0 M0 presumed non-small cell lung cancer, and she is  not an ideal candidate for surgery.  She may benefit from curative stereotactic body radiotherapy.  Since her CT, PET, and biopsy are all consistently highly suspicious for malignancy, it may be most appropriate to proceed with treatment using radiaiton.  PLAN:Today, I talked to the patient and family about the findings and work-up thus far.  We discussed the natural history of early stage unresectable lung cancer and general treatment, highlighting the role of radiotherapy in the management.  We discussed the available radiation techniques, and focused on the details of logistics and delivery.  We reviewed the anticipated acute and late sequelae associated with radiation in this setting.  The patient was encouraged to ask questions that I answered to the best of my ability.  I filled out a patient counseling form during our discussion including treatment diagrams.  We retained a copy for our records.  The patient would like to proceed with radiation and will be scheduled for CT simulation.  I spent 60 minutes minutes face to face with the patient and more than 50% of that time was spent in counseling and/or coordination of care.  ------------------------------------------------  Sheral Apley. Tammi Klippel, M.D.

## 2014-01-30 ENCOUNTER — Ambulatory Visit
Admission: RE | Admit: 2014-01-30 | Discharge: 2014-01-30 | Disposition: A | Discharge status: Home / Self Care | Payer: Medicare Other | Source: Ambulatory Visit | Attending: Radiation Oncology | Admitting: Radiation Oncology

## 2014-01-30 DIAGNOSIS — C3411 Malignant neoplasm of upper lobe, right bronchus or lung: Secondary | ICD-10-CM

## 2014-01-30 DIAGNOSIS — Z510 Encounter for antineoplastic radiation therapy: Secondary | ICD-10-CM | POA: Diagnosis not present

## 2014-01-30 NOTE — Progress Notes (Signed)
  Radiation Oncology         [phone]) [phone] ________________________________  Name: Marissa [name] MRN: [id number]  Date: 01/30/2014  DOB: Aug 17, 1933  STEREOTACTIC BODY RADIOTHERAPY SIMULATION AND TREATMENT PLANNING NOTE    ICD-9-CM ICD-10-CM   1. Primary cancer of right upper lobe of lung 162.3 C34.11     DIAGNOSIS:  80 yo woman with stage T1a N0 M0 presumed non-small cell lung cancer - Stage IA  NARRATIVE:  The patient was brought to the [facility].  Identity was confirmed.  All relevant records and images related to the planned course of therapy were reviewed.  The patient freely provided informed written consent to proceed with treatment after reviewing the details related to the planned course of therapy. The consent form was witnessed and verified by the simulation staff.  Then, the patient was set-up in a stable reproducible  supine position for radiation therapy.  A BodyFix immobilization pillow was fabricated for reproducible positioning.  Then I personally applied the abdominal compression paddle to limit respiratory excursion.  4D respiratoy motion management CT images were obtained.  Surface markings were placed.  The CT images were loaded into the planning software.  Then, using Cine, MIP, and standard views, the internal target volume (ITV) and planning target volumes (PTV) were delinieated, and avoidance structures were contoured.  Treatment planning then occurred.  The radiation prescription was entered and confirmed.  A total of two complex treatment devices were fabricated in the form of the BodyFix immobilization pillow and a neck accuform cushion.  I have requested : 3D Simulation  I have requested a DVH of the following structures: Heart, Lungs, Esophagus, Chest Wall, Brachial Plexus, Major Blood Vessels, and targets.  PLAN:  The patient will receive 54 Gy in 3 fraction.  ________________________________  Sheral Apley Tammi Klippel, M.D.

## 2014-02-03 LAB — FUNGUS CULTURE W SMEAR: FUNGAL SMEAR: NONE SEEN

## 2014-02-04 DIAGNOSIS — Z510 Encounter for antineoplastic radiation therapy: Secondary | ICD-10-CM | POA: Diagnosis not present

## 2014-02-04 DIAGNOSIS — C3411 Malignant neoplasm of upper lobe, right bronchus or lung: Secondary | ICD-10-CM | POA: Diagnosis not present

## 2014-02-09 NOTE — Progress Notes (Signed)
  Radiation Oncology         [phone]) [phone] ________________________________  Name: Marissa [name] MRN: [id number]  Date: 02/10/2014  DOB: 06/19/1933  Stereotactic Body Radiotherapy Treatment Procedure Note    ICD-9-CM ICD-10-CM   1. Primary cancer of right upper lobe of lung 162.3 C34.11     CURRENT FRACTION:    1  PLANNED FRACTIONS:  3  NARRATIVE:  Marissa [name] was brought to the stereotactic radiation treatment machine and placed supine on the CT couch. The patient was set up for stereotactic body radiotherapy on the body fix pillow.  3D TREATMENT PLANNING AND DOSIMETRY:  The patient's radiation plan was reviewed and approved prior to starting treatment.  It showed 3-dimensional radiation distributions overlaid onto the planning CT.  The [facility] for the target structures as well as the organs at risk were reviewed. The documentation of this is filed in the radiation oncology EMR.  SIMULATION VERIFICATION:  The patient underwent CT imaging on the treatment unit.  These were carefully aligned to document that the ablative radiation dose would cover the target volume and maximally spare the nearby organs at risk according to the planned distribution.  SPECIAL TREATMENT PROCEDURE: Marissa [name] received high dose ablative stereotactic body radiotherapy to the planned target volume without unforeseen complications. Treatment was delivered uneventfully. The high doses associated with stereotactic body radiotherapy and the significant potential risks require careful treatment set up and patient monitoring constituting a special treatment procedure   STEREOTACTIC TREATMENT MANAGEMENT:  Following delivery, the patient was evaluated clinically. The patient tolerated treatment without significant acute effects, and was discharged to home in stable condition.    PLAN: Continue treatment as planned.  ________________________________  Sheral Apley. Tammi Klippel, M.D.

## 2014-02-10 ENCOUNTER — Ambulatory Visit
Admission: RE | Admit: 2014-02-10 | Discharge: 2014-02-10 | Disposition: A | Discharge status: Home / Self Care | Payer: Medicare Other | Source: Ambulatory Visit | Attending: Radiation Oncology | Admitting: Radiation Oncology

## 2014-02-10 ENCOUNTER — Encounter: Payer: Self-pay | Admitting: Cardiology

## 2014-02-10 ENCOUNTER — Ambulatory Visit (INDEPENDENT_AMBULATORY_CARE_PROVIDER_SITE_OTHER): Payer: Medicare Other | Admitting: Cardiology

## 2014-02-10 VITALS — BP 118/70 | HR 71 | Ht 61.0 in | Wt 145.0 lb

## 2014-02-10 DIAGNOSIS — J438 Other emphysema: Secondary | ICD-10-CM | POA: Diagnosis not present

## 2014-02-10 DIAGNOSIS — I481 Persistent atrial fibrillation: Secondary | ICD-10-CM | POA: Diagnosis not present

## 2014-02-10 DIAGNOSIS — I059 Rheumatic mitral valve disease, unspecified: Secondary | ICD-10-CM

## 2014-02-10 DIAGNOSIS — Z510 Encounter for antineoplastic radiation therapy: Secondary | ICD-10-CM | POA: Diagnosis not present

## 2014-02-10 DIAGNOSIS — I2583 Coronary atherosclerosis due to lipid rich plaque: Principal | ICD-10-CM

## 2014-02-10 DIAGNOSIS — C3411 Malignant neoplasm of upper lobe, right bronchus or lung: Secondary | ICD-10-CM

## 2014-02-10 DIAGNOSIS — I2510 Atherosclerotic heart disease of native coronary artery without angina pectoris: Secondary | ICD-10-CM | POA: Diagnosis not present

## 2014-02-10 DIAGNOSIS — I4819 Other persistent atrial fibrillation: Secondary | ICD-10-CM

## 2014-02-10 NOTE — Progress Notes (Signed)
[place]. [address] [place], [place]  [address] Phone: [phone] Fax:  [phone]  Date:  02/10/2014   ID:  Marissa [name], DOB Dec 12, 1933, MRN [id number]  PCP:  [name], MD   History of Present Illness: Marissa [name] is a 80 y.o. female with coronary artery disease status post bypass surgery with redo bypass surgery in January of 2013, COPD, aortic valve replacement in 2013 with 21 mm porcine valve, history of rheumatic fever with mitral valve replacement in 2013 with 27 mm porcine valve, history of atrial fibrillation, pulmonary hypertension, heart failure, with right upper lobe lung mass, hypermetabolic suspicious for malignancy appearing to be a stage IA lesion, radiation oncology treatment here for follow up.    Coronary surgery was performed in [facility] In review of history and physical from January of 2013, she had moderate aortic regurgitation, moderate to severe pulmonary hypertension, right coronary artery was occluded, patent right coronary bypass graft with some irregularity, LIMA to LAD patent.   She had a chronic long-standing atrial fibrillation history but has been intolerant to anticoagulants. Coumadin very small doses have caused bleeding into her ear, she states. She's been reluctant to try anticoagulantion again because of this. She has never had a stroke. Her redo bypass involved a saphenous vein graft to the RCA and redo mitral valve replacement.  Last admitted with diastolic heart failure in the setting of labile hypertension at [facility] 8/16 through 8/19. IV diuresis with Lasix. Normal ejection fraction, normal functioning mitral valve and aortic valve prosthesis. Eliquis at that time was started. Micardis was discontinued secondary to low blood pressure, Lasix was changed to 40 a.m., 20 p.m.  She's not having any shortness of breath, no chest pain, no strokelike symptoms, no orthopnea. She did  state however that she's been having some numbness sensation in her bilateral feet, not exacerbated by motion, constant (peripheral neuropathy like).    Wt Readings from Last 3 Encounters:  02/10/14 145 lb (65.772 kg)  01/13/14 146 lb (66.225 kg)  01/06/14 146 lb (66.225 kg)     Past Medical History  Diagnosis Date  . HTN (hypertension)   . Hypercholesteremia   . CAD (coronary artery disease)     s/p CABG 03/2011  . Aortic valve disorders   . RLS (restless legs syndrome)   . A-fib   . Osteopenia   . Mitral valve disease     s/p MVR 03/2011 w/ 27 mm porcine valve  . Hypothyroidism   . COPD (chronic obstructive pulmonary disease)   . CHF (congestive heart failure)   . Aortic stenosis     (s/p AVR w/ 21 mm porcine valve 03/2011  . Mastoiditis   . Myocardial infarction     "slight"  . Dysrhythmia   . Shortness of breath     with exertion  . Pre-diabetes   . GERD (gastroesophageal reflux disease)     takes nexium prn  . Arthritis   . History of blood transfusion     Rheumatic Fever  . Lung cancer     Past Surgical History  Procedure Laterality Date  . Mitral valve replacement      1990's  . Laparoscopic cholecystectomy    . Aortic valve replacement    . Tonsillectomy    . Coronary artery bypass graft  03/2011    1990's also  . Video bronchoscopy with endobronchial navigation N/A 01/06/2014    Procedure: VIDEO  BRONCHOSCOPY WITH ENDOBRONCHIAL NAVIGATION;  Surgeon: [name], MD;  Location: [place];  Service: Thoracic;  Laterality: N/A;  . Video bronchoscopy with endobronchial ultrasound N/A 01/06/2014    Procedure: VIDEO BRONCHOSCOPY WITH ENDOBRONCHIAL ULTRASOUND;  Surgeon: [name], MD;  Location: [place];  Service: Thoracic;  Laterality: N/A;    Current Outpatient Prescriptions  Medication Sig Dispense Refill  . aspirin EC 81 MG tablet Take 1 tablet (81 mg total) by mouth daily.    [name] atorvastatin (LIPITOR) 40 MG tablet Take 40 mg by mouth daily.      . budesonide-formoterol (SYMBICORT) 160-4.5 MCG/ACT inhaler Inhale 2 puffs into the lungs 2 (two) times daily.    . Calcium-Magnesium-Vitamin D (CALCIUM 500 PO) Take 1 tablet by mouth 2 (two) times daily.    . carvedilol (COREG) 6.25 MG tablet Take 1 tablet (6.25 mg total) by mouth 2 (two) times daily with a meal. 180 tablet 1  . digoxin (LANOXIN) 0.125 MG tablet Take 0.125 mg by mouth daily.     [name] esomeprazole (NEXIUM) 40 MG capsule Take 40 mg by mouth daily as needed (for acid reflux).     . fluticasone-salmeterol (ADVAIR HFA) 115-21 MCG/ACT inhaler Inhale 1 puff into the lungs 2 (two) times daily.    . furosemide (LASIX) 40 MG tablet Take 20-40 mg by mouth 2 (two) times daily. Take 40 mg by mouth in the morning and take 20 mg by mouth in the evening.    . Ipratropium-Albuterol (COMBIVENT) 20-100 MCG/ACT AERS respimat Inhale 1 puff into the lungs every 6 (six) hours as needed for wheezing or shortness of breath.     [name] ipratropium-albuterol (DUONEB) 0.5-2.5 (3) MG/3ML SOLN Take 3 mLs by nebulization 2 (two) times daily as needed (for shortness of breath).     [name] levothyroxine (SYNTHROID, LEVOTHROID) 100 MCG tablet Take 100 mcg by mouth daily before breakfast.     . potassium chloride SA (K-DUR,KLOR-CON) 20 MEQ tablet Take 20 mEq by mouth daily.     [name] tiotropium (SPIRIVA) 18 MCG inhalation capsule Place 18 mcg into inhaler and inhale daily.    . vitamin C (ASCORBIC ACID) 500 MG tablet Take 500 mg by mouth daily.     No current facility-administered medications for this visit.    Allergies:    Allergies  Allergen Reactions  . Coumadin [Warfarin Sodium] Other (See Comments)    Bleeding from ears  . Prozac [Fluoxetine Hcl] Rash    Social History:  The patient  reports that she has quit smoking. Her smoking use included Cigarettes. She has a 15 pack-year smoking history. She has never used smokeless tobacco. She reports that she does not drink alcohol or use illicit drugs. her son died in Oct 21, 2011.  ROS:  Please see the history of present illness.   Denies any bleeding, syncope, orthopnea, PND  PHYSICAL EXAM: VS:  BP 118/70 mmHg  Pulse 71  Ht 5\' 1"  (1.549 m)  Wt 145 lb (65.772 kg)  BMI 27.41 kg/m2 Well nourished, well developed, in no acute distress HEENT: normal Neck: no JVD Cardiac:  Irregularly irregular; soft systolic murmur Lungs:  clear to auscultation bilaterally, no wheezing, rhonchi or rales Abd: soft, nontender, no hepatomegaly Ext: no edemaMild erythema on medial aspect of ankle right leg.  Skin: warm and dry Neuro: no focal abnormalities noted  EKG: 02/25/13- Atrial fibrillation, heart rate 80, nonspecific ST changes   09/09/13-atrial fibrillation, 77, borderline right bundle branch block.   Echocardiogram:  8/15 - Echo (8/15 - done at [facility]): EF 55-60%, mod LVH, restrictive pattern, mod LAE, mild RAE, MVR ok with mild MS (mean 5 mmHg), AVR ok with mild AS (mean 16 mmHg), mod elevated PASP, mild TR   ASSESSMENT AND PLAN:  1. Stage IA lung mass-undergoing first radiation therapy treatment today. Consultation note reviewed. 2. Coronary artery disease-status post bypass. No active anginal symptoms. Doing well. Prior bypass anatomy reviewed. 3. Chronic diastolic heart failure-encouraged her to utilize her Lasix. 4. Atrial fibrillation- previous lengthy discussions about anticoagulation. She does not wish to restart Coumadin because of previous inner ear bleeding, mastoid. She adamantly does not wish to be placed back on anticoagulation. She understands the risk of stroke. Dilated left atrium noted. Previous history of rheumatic heart disease. She was placed on Eliquis however this was discontinued presumably for the biopsy. I asked her about restarting and she did not wish to do so. We will revisit after she is done with her radiation treatments. Anticoagulation could potentially precipitate bleeding. 5. Aortic valve replacement-previous aortic  stenosis. 6. Mitral valve replacement as well. 7. COPD-she states that she quit smoking in the 80s. She does not understand why her lungs are the way that they are. 8. Peripheral neuropathy- Dr. Marisue Humble. She has excellent dorsalis pedis pulse.  9. 6 month f/u  Signed, Candee Furbish, MD [facility]  02/10/2014 8:43 AM

## 2014-02-10 NOTE — Patient Instructions (Signed)
The current medical regimen is effective;  continue present plan and medications.  Follow up in 6 months with Dr. Marlou Porch.  You will receive a letter in the mail 2 months before you are due.  Please call us when you receive this letter to schedule your follow up appointment.

## 2014-02-11 NOTE — Progress Notes (Signed)
  Radiation Oncology         [phone] ________________________________  Name: Marissa [name] MRN: [id number]  Date: 02/12/2014  DOB: 09-07-1933  Weekly Radiation Therapy Management    ICD-9-CM ICD-10-CM   1. Primary cancer of right upper lobe of lung 162.3 C34.11     Current Dose: 36 Gy     Planned Dose:  54 Gy  Narrative . . . . . . . . The patient presents for routine under treatment assessment.                                   The patient is without complaint.                                 Set-up films were reviewed.                                 The chart was checked. Physical Findings. . .  weight is 146 lb 3.2 oz (66.316 kg). Her blood pressure is 148/94 and her pulse is 59. Her respiration is 16. . Weight essentially stable.  No significant changes. Impression . . . . . . . The patient is tolerating radiation. Plan . . . . . . . . . . . . Continue treatment as planned.  ________________________________  Sheral Apley. Tammi Klippel, M.D.

## 2014-02-12 ENCOUNTER — Encounter: Payer: Self-pay | Admitting: Radiation Oncology

## 2014-02-12 ENCOUNTER — Ambulatory Visit
Admission: RE | Admit: 2014-02-12 | Discharge: 2014-02-12 | Disposition: A | Discharge status: Home / Self Care | Payer: Medicare Other | Source: Ambulatory Visit | Attending: Radiation Oncology | Admitting: Radiation Oncology

## 2014-02-12 VITALS — BP 148/94 | HR 59 | Resp 16 | Wt 146.2 lb

## 2014-02-12 DIAGNOSIS — Z510 Encounter for antineoplastic radiation therapy: Secondary | ICD-10-CM | POA: Diagnosis not present

## 2014-02-12 DIAGNOSIS — R0602 Shortness of breath: Secondary | ICD-10-CM | POA: Diagnosis not present

## 2014-02-12 DIAGNOSIS — C3411 Malignant neoplasm of upper lobe, right bronchus or lung: Secondary | ICD-10-CM | POA: Diagnosis not present

## 2014-02-12 DIAGNOSIS — R05 Cough: Secondary | ICD-10-CM | POA: Insufficient documentation

## 2014-02-12 MED ORDER — RADIAPLEXRX EX GEL
Freq: Once | CUTANEOUS | Status: AC
  Administered 2014-02-12: 14:00:00 via TOPICAL

## 2014-02-12 NOTE — Progress Notes (Signed)
Reports increased frequency of dry cough. Denies difficulty or painful swallowing. Reports increased shortness of breath. Denies skin changes within treatment field. Provided patient with radiaplex today and directed upon use. Patient verbalized understanding. Weight stable. BP slightly elevated.

## 2014-02-12 NOTE — Progress Notes (Signed)
  Radiation Oncology         [phone] ________________________________  Name: MAMYE BOLDS MRN: [id number]  Date: 02/12/2014  DOB: May 07, 1933  Stereotactic Body Radiotherapy Treatment Procedure Note Right upper lung   NARRATIVE:  KHYLI SWAIM was brought to the stereotactic radiation treatment machine and placed supine on the CT couch. The patient was set up for stereotactic body radiotherapy on the body fix pillow.  3D TREATMENT PLANNING AND DOSIMETRY:  The patient's radiation plan was reviewed and approved prior to starting treatment.  It showed 3-dimensional radiation distributions overlaid onto the planning CT.  The [facility] for the target structures as well as the organs at risk were reviewed. The documentation of this is filed in the radiation oncology EMR.  SIMULATION VERIFICATION:  The patient underwent CT imaging on the treatment unit.  These were carefully aligned to document that the ablative radiation dose would cover the target volume and maximally spare the nearby organs at risk according to the planned distribution.  SPECIAL TREATMENT PROCEDURE: Damita Dunnings Yoo received high dose ablative stereotactic body radiotherapy to the planned target volume without unforeseen complications. Treatment was delivered uneventfully. The high doses associated with stereotactic body radiotherapy and the significant potential risks require careful treatment set up and patient monitoring constituting a special treatment procedure   STEREOTACTIC TREATMENT MANAGEMENT:  Following delivery, the patient was evaluated clinically. The patient tolerated treatment without significant acute effects, and was discharged to home in stable condition.    PLAN: Continue treatment as planned.  ________________________________ -----------------------------------  Eppie Gibson, MD

## 2014-02-12 NOTE — Addendum Note (Signed)
Encounter addended by: Heywood Footman, RN on: 02/12/2014  1:45 PM<BR>     Documentation filed: [employer], Inpatient MAR, Orders

## 2014-02-17 ENCOUNTER — Encounter: Payer: Self-pay | Admitting: Radiation Oncology

## 2014-02-17 ENCOUNTER — Ambulatory Visit
Admission: RE | Admit: 2014-02-17 | Discharge: 2014-02-17 | Disposition: A | Discharge status: Home / Self Care | Payer: Medicare Other | Source: Ambulatory Visit | Attending: Radiation Oncology | Admitting: Radiation Oncology

## 2014-02-17 DIAGNOSIS — Z510 Encounter for antineoplastic radiation therapy: Secondary | ICD-10-CM | POA: Diagnosis not present

## 2014-02-17 NOTE — Progress Notes (Signed)
  Radiation Oncology         [phone] ________________________________  Name: Marissa [name] MRN: [id number]  Date: 02/17/2014  DOB: 11/02/1933  Stereotactic Body Radiotherapy Treatment Procedure Note  NARRATIVE:  Marissa [name] was brought to the stereotactic radiation treatment machine and placed supine on the CT couch. The patient was set up for stereotactic body radiotherapy on the body fix pillow.  3D TREATMENT PLANNING AND DOSIMETRY:  The patient's radiation plan was reviewed and approved prior to starting treatment.  It showed 3-dimensional radiation distributions overlaid onto the planning CT.  The [facility] for the target structures as well as the organs at risk were reviewed. The documentation of this is filed in the radiation oncology EMR.  SIMULATION VERIFICATION:  The patient underwent CT imaging on the treatment unit.  These were carefully aligned to document that the ablative radiation dose would cover the right lung target volume and maximally spare the nearby organs at risk according to the planned distribution.  SPECIAL TREATMENT PROCEDURE: Marissa [name] received high dose ablative stereotactic body radiotherapy to the planned  right lung target volume without unforeseen complications. Treatment was delivered uneventfully.  She received her final fraction of 1800 cGy of a prescribed 5400 cGy in 3 sessions.  The high doses associated with stereotactic body radiotherapy and the significant potential risks require careful treatment set up and patient monitoring constituting a special treatment procedure   STEREOTACTIC TREATMENT MANAGEMENT:  Following delivery, the patient was evaluated clinically. The patient tolerated treatment without significant acute effects, and was discharged to home in stable condition.    PLAN: Continue treatment as planned.  ------------------------------------------------        Marissa Edison, MD

## 2014-02-18 LAB — AFB CULTURE WITH SMEAR (NOT AT ARMC): ACID FAST SMEAR: NONE SEEN

## 2014-02-19 NOTE — Progress Notes (Signed)
  Radiation Oncology         [phone] ________________________________  Name: Marissa [name] MRN: [id number]  Date: 02/17/2014  DOB: [birth year]  End of Treatment Note  Diagnosis:   80 yo woman with stage T1a N0 M0 presumed non-small cell lung cancer - Stage IA  Indication for treatment:  Curative, Definitive SBRT       Radiation treatment dates:    02/10/2014, 02/12/2014, 02/17/2014     Site/dose:   The target was treated to 54 Gy in 3 fractions of 18 Gy  Beams/energy:   The patient was treated using stereotactic body radiotherapy according to a 3D conformal radiotherapy plan.  Volumetric arc fields were employed to deliver 6 MV X-rays.  Image guidance was performed with per fraction cone beam CT prior to treatment under personal MD supervision.  Immobilization was achieved using BodyFix Pillow.  Narrative: The patient tolerated radiation treatment relatively well.   No acute complications occurred.  Plan: The patient has completed radiation treatment. The patient will return to radiation oncology clinic for routine followup in one month. I advised them to call or return sooner if they have any questions or concerns related to their recovery or treatment. ________________________________  Sheral Apley. Tammi Klippel, M.D.

## 2014-03-20 ENCOUNTER — Ambulatory Visit: Payer: Medicare Other | Admitting: Radiation Oncology

## 2014-03-24 DIAGNOSIS — Z9889 Other specified postprocedural states: Secondary | ICD-10-CM | POA: Diagnosis not present

## 2014-03-24 DIAGNOSIS — H6121 Impacted cerumen, right ear: Secondary | ICD-10-CM | POA: Diagnosis not present

## 2014-03-24 DIAGNOSIS — Z8669 Personal history of other diseases of the nervous system and sense organs: Secondary | ICD-10-CM | POA: Diagnosis not present

## 2014-03-27 ENCOUNTER — Encounter: Payer: Self-pay | Admitting: Radiation Oncology

## 2014-03-27 ENCOUNTER — Telehealth: Payer: Self-pay | Admitting: *Deleted

## 2014-03-27 ENCOUNTER — Ambulatory Visit
Admission: RE | Admit: 2014-03-27 | Discharge: 2014-03-27 | Disposition: A | Discharge status: Home / Self Care | Payer: Medicare Other | Source: Ambulatory Visit | Attending: Radiation Oncology | Admitting: Radiation Oncology

## 2014-03-27 VITALS — BP 142/59 | HR 63 | Resp 16

## 2014-03-27 DIAGNOSIS — C3411 Malignant neoplasm of upper lobe, right bronchus or lung: Secondary | ICD-10-CM

## 2014-03-27 NOTE — Telephone Encounter (Signed)
CALLED PATIENT'S DAUGHTER- PEGGY TO INFORM OF SCAN AND FU PER DR. MANNING REQUEST, LVM FOR RETURN CALL

## 2014-03-27 NOTE — Progress Notes (Signed)
Patient reports a productive cough in the morning only with clear sputum. Denies hemoptysis. Shortness of breath with mild exertion. Denies difficulty swallowing. Denies fatigue. Reports occasional hot flashes. Denies headache, dizziness, nausea, vomiting, or diarrhea. Weight and vitals stable. Denies pain.

## 2014-03-27 NOTE — Progress Notes (Signed)
Radiation Oncology         [phone] ________________________________  Name: Marissa [name] MRN: [id number]  Date: 03/27/2014  DOB: 17-Dec-1933    Follow-Up Visit Note  CC: [name], MD  [name], *  Diagnosis:   81 yo woman with stage T1a N0 M0 presumed non-small cell lung cancer - Stage IA s/p Curative, Definitive SBRT11/23/2015, 02/12/2014, 11/30/2015treated to 54 Gy in 3 fractions     ICD-9-CM ICD-10-CM   1. Primary cancer of right upper lobe of lung 162.3 C34.11 CT Chest Wo Contrast    Interval Since Last Radiation:  6  weeks  Narrative:  The patient returns today for routine follow-up.  Patient reports a productive cough in the morning only with clear sputum. Denies hemoptysis. Shortness of breath with mild exertion. Denies difficulty swallowing. Denies fatigue. Reports occasional hot flashes. Denies headache, dizziness, nausea, vomiting, or diarrhea. Weight and vitals stable. Denies pain                              ALLERGIES:  is allergic to coumadin and prozac.  Meds: Current Outpatient Prescriptions  Medication Sig Dispense Refill  . aspirin EC 81 MG tablet Take 1 tablet (81 mg total) by mouth daily.    [name] atorvastatin (LIPITOR) 40 MG tablet Take 40 mg by mouth daily.     . budesonide-formoterol (SYMBICORT) 160-4.5 MCG/ACT inhaler Inhale 2 puffs into the lungs 2 (two) times daily.    . Calcium-Magnesium-Vitamin D (CALCIUM 500 PO) Take 1 tablet by mouth 2 (two) times daily.    . carvedilol (COREG) 6.25 MG tablet Take 1 tablet (6.25 mg total) by mouth 2 (two) times daily with a meal. 180 tablet 1  . digoxin (LANOXIN) 0.125 MG tablet Take 0.125 mg by mouth daily.     [name] esomeprazole (NEXIUM) 40 MG capsule Take 40 mg by mouth daily as needed (for acid reflux).     . fluticasone-salmeterol (ADVAIR HFA) 115-21 MCG/ACT inhaler Inhale 1 puff into the lungs 2 (two) times daily.    . furosemide (LASIX) 40 MG tablet Take 20-40 mg by mouth 2 (two) times  daily. Take 40 mg by mouth in the morning and take 20 mg by mouth in the evening.    . Ipratropium-Albuterol (COMBIVENT) 20-100 MCG/ACT AERS respimat Inhale 1 puff into the lungs every 6 (six) hours as needed for wheezing or shortness of breath.     [name] ipratropium-albuterol (DUONEB) 0.5-2.5 (3) MG/3ML SOLN Take 3 mLs by nebulization 2 (two) times daily as needed (for shortness of breath).     [name] levothyroxine (SYNTHROID, LEVOTHROID) 100 MCG tablet Take 100 mcg by mouth daily before breakfast.     . potassium chloride SA (K-DUR,KLOR-CON) 20 MEQ tablet Take 20 mEq by mouth daily.     [name] tiotropium (SPIRIVA) 18 MCG inhalation capsule Place 18 mcg into inhaler and inhale daily.    . vitamin C (ASCORBIC ACID) 500 MG tablet Take 500 mg by mouth daily.     No current facility-administered medications for this encounter.    Physical Findings: The patient is in no acute distress. Patient is alert and oriented.  blood pressure is 142/59 and her pulse is 63. Her respiration is 16 and oxygen saturation is 95%. .  No significant changes.  Lab Findings: Lab Results  Component Value Date   WBC 10.9* 01/02/2014   HGB 13.9 01/02/2014   HCT 42.5 01/02/2014  MCV 88.2 01/02/2014   PLT 182 01/02/2014    @LASTCHEM @  Radiographic Findings: No results found.  Impression:  The patient is recovering from the effects of radiation.    Plan:  CT chest to assess response, then follow-up.  _____________________________________  Sheral Apley. Tammi Klippel, M.D.

## 2014-04-03 ENCOUNTER — Ambulatory Visit (HOSPITAL_COMMUNITY): Payer: Medicare Other

## 2014-04-14 ENCOUNTER — Ambulatory Visit
Admission: RE | Admit: 2014-04-14 | Discharge: 2014-04-14 | Disposition: A | Discharge status: Home / Self Care | Payer: Medicare Other | Source: Ambulatory Visit | Attending: Radiation Oncology | Admitting: Radiation Oncology

## 2014-04-14 ENCOUNTER — Ambulatory Visit (HOSPITAL_COMMUNITY)
Admission: RE | Admit: 2014-04-14 | Discharge: 2014-04-14 | Disposition: A | Discharge status: Home / Self Care | Payer: Medicare Other | Source: Ambulatory Visit | Attending: Radiation Oncology | Admitting: Radiation Oncology

## 2014-04-14 ENCOUNTER — Encounter (HOSPITAL_COMMUNITY): Payer: Self-pay

## 2014-04-14 ENCOUNTER — Encounter: Payer: Self-pay | Admitting: Radiation Oncology

## 2014-04-14 VITALS — BP 165/70 | HR 62 | Resp 16 | Wt 150.2 lb

## 2014-04-14 DIAGNOSIS — C3411 Malignant neoplasm of upper lobe, right bronchus or lung: Secondary | ICD-10-CM

## 2014-04-14 DIAGNOSIS — I712 Thoracic aortic aneurysm, without rupture: Secondary | ICD-10-CM | POA: Insufficient documentation

## 2014-04-14 DIAGNOSIS — C3490 Malignant neoplasm of unspecified part of unspecified bronchus or lung: Secondary | ICD-10-CM | POA: Diagnosis not present

## 2014-04-14 DIAGNOSIS — N200 Calculus of kidney: Secondary | ICD-10-CM | POA: Diagnosis not present

## 2014-04-14 DIAGNOSIS — R0602 Shortness of breath: Secondary | ICD-10-CM | POA: Diagnosis not present

## 2014-04-14 DIAGNOSIS — K760 Fatty (change of) liver, not elsewhere classified: Secondary | ICD-10-CM | POA: Insufficient documentation

## 2014-04-14 DIAGNOSIS — I719 Aortic aneurysm of unspecified site, without rupture: Secondary | ICD-10-CM | POA: Diagnosis not present

## 2014-04-14 NOTE — Progress Notes (Signed)
Radiation Oncology         [phone]) [phone] ________________________________  Name: Marissa [name] MRN: [id number]  Date: 04/14/2014  DOB: May 25, 1933     Follow-Up Visit Note  CC: [name], MD [name], *  Diagnosis: 81 yo woman with stage T1a N0 M0 presumed non-small cell lung cancer - Stage IA s/p Curative, Definitive SBRT11/23/2015, 02/12/2014, 11/30/2015treated to 54 Gy in 3 fractions     ICD-9-CM ICD-10-CM   1. Primary cancer of right upper lobe of lung 162.3 C34.11 CT Chest Wo Contrast    Interval Since Last Radiation:  8  weeks  Narrative:  The patient returns today for routine follow-up.  Patient denies any changes since her last visit on 03/27/2014. Patient here to review chest CT done earlier today. Patient reports a productive cough in the mornings with clear sputum. Patient denies hemoptysis. Reports shortness of breath with exertion. Denies difficulty swallowing or fatigue. Denies pain. Reports occasional hot flashes. Weight stable. BP slightly elevated. Denies headache, dizziness, nausea, vomiting or diarrhea                              ALLERGIES:  is allergic to coumadin and prozac.  Meds: Current Outpatient Prescriptions  Medication Sig Dispense Refill  . aspirin EC 81 MG tablet Take 1 tablet (81 mg total) by mouth daily.    [name] atorvastatin (LIPITOR) 40 MG tablet Take 40 mg by mouth daily.     . budesonide-formoterol (SYMBICORT) 160-4.5 MCG/ACT inhaler Inhale 2 puffs into the lungs 2 (two) times daily.    . Calcium-Magnesium-Vitamin D (CALCIUM 500 PO) Take 1 tablet by mouth 2 (two) times daily.    . carvedilol (COREG) 6.25 MG tablet Take 1 tablet (6.25 mg total) by mouth 2 (two) times daily with a meal. 180 tablet 1  . digoxin (LANOXIN) 0.125 MG tablet Take 0.125 mg by mouth daily.     [name] esomeprazole (NEXIUM) 40 MG capsule Take 40 mg by mouth daily as needed (for acid reflux).     . fluticasone-salmeterol (ADVAIR HFA) 115-21 MCG/ACT inhaler  Inhale 1 puff into the lungs 2 (two) times daily.    . furosemide (LASIX) 40 MG tablet Take 20-40 mg by mouth 2 (two) times daily. Take 40 mg by mouth in the morning and take 20 mg by mouth in the evening.    . Ipratropium-Albuterol (COMBIVENT) 20-100 MCG/ACT AERS respimat Inhale 1 puff into the lungs every 6 (six) hours as needed for wheezing or shortness of breath.     [name] ipratropium-albuterol (DUONEB) 0.5-2.5 (3) MG/3ML SOLN Take 3 mLs by nebulization 2 (two) times daily as needed (for shortness of breath).     [name] levothyroxine (SYNTHROID, LEVOTHROID) 100 MCG tablet Take 100 mcg by mouth daily before breakfast.     . potassium chloride SA (K-DUR,KLOR-CON) 20 MEQ tablet Take 20 mEq by mouth daily.     [name] tiotropium (SPIRIVA) 18 MCG inhalation capsule Place 18 mcg into inhaler and inhale daily.    . vitamin C (ASCORBIC ACID) 500 MG tablet Take 500 mg by mouth daily.     No current facility-administered medications for this encounter.    Physical Findings: The patient is in no acute distress. Patient is alert and oriented.  weight is 150 lb 3.2 oz (68.13 kg). Her blood pressure is 165/70 and her pulse is 62. Her respiration is 16 and oxygen saturation is 93%. .  No significant  changes.  Lab Findings: Lab Results  Component Value Date   WBC 10.9* 01/02/2014   HGB 13.9 01/02/2014   HCT 42.5 01/02/2014   MCV 88.2 01/02/2014   PLT 182 01/02/2014    @LASTCHEM @  Radiographic Findings: Ct Chest Wo Contrast  04/14/2014   CLINICAL DATA:  Lung cancer, shortness of breath.  EXAM: CT CHEST WITHOUT CONTRAST  TECHNIQUE: Multidetector CT imaging of the chest was performed following the standard protocol without IV contrast.  COMPARISON:  PET 12/16/2013 and CT chest 12/02/2013.  FINDINGS: Mediastinum/Nodes: A lymph node with a fatty hilum in the lower right paratracheal station measures 1.2 cm, unchanged. Hilar regions are difficult to definitively evaluate without IV contrast. No axillary adenopathy.  Atherosclerotic calcification of the arterial vasculature. Ascending aorta measures approximately 4.2 cm. Heart is mildly enlarged. No pericardial effusion.  Lungs/Pleura: Centrilobular emphysema. Spiculated nodule in the right upper lobe measures approximately 1.1 x 1.3 cm (previously 1.4 x 1.6 cm) and has mild surrounding ground-glass and architectural distortion, presumably related to radiation therapy. 8 mm subpleural nodule along the right hemidiaphragm (image 42) is unchanged. Scarring and volume loss in both lower lobes, left greater than right, as before. No pleural fluid. Airway is unremarkable.  Upper abdomen: Visualized portion of the liver is decreased in attenuation diffusely. Cholecystectomy. Adrenal glands are unremarkable. Tiny left renal stones, partially imaged. Visualized portions of the spleen, pancreas, stomach and bowel are grossly unremarkable. No upper abdominal adenopathy.  Musculoskeletal: No worrisome lytic or sclerotic lesions. Degenerative changes are seen in the spine. Healed fracture deformity of the proximal right humerus is noted with degenerative changes in the right shoulder.  IMPRESSION: 1. Interval response to therapy as evidenced by slight decrease in size of a right upper lobe nodule, with surrounding radiation changes. 2. No evidence of metastatic disease. 3. Ascending aortic aneurysm. Recommend annual imaging followup by CTA or MRA. This recommendation follows 2010 ACCF/AHA/AATS/ACR/ASA/SCA/SCAI/SIR/STS/SVM Guidelines for the Diagnosis and Management of Patients with Thoracic Aortic Disease. Circulation. 2010; 121: C166-A630. 4. Fatty liver. 5. Tiny left renal stones.   Electronically Signed   By: Lorin Picket M.D.   On: 04/14/2014 13:35    Impression:  The patient is recovering from the effects of radiation.  CT scan shows that the tumor has decreased in size and appears more stellate and scarlike than rounded and masslike.  Plan:  Repeat chest CT in 6 months then  follow-up.  _____________________________________  Sheral Apley. Tammi Klippel, M.D.

## 2014-04-14 NOTE — Progress Notes (Signed)
Patient denies any changes since her last visit on 03/27/2014. Patient here to review chest CT done earlier today. Patient reports a productive cough in the mornings with clear sputum. Patient denies hemoptysis. Reports shortness of breath with exertion. Denies difficulty swallowing or fatigue. Denies pain. Reports occasional hot flashes. Weight stable. BP slightly elevated. Denies headache, dizziness, nausea, vomiting or diarrhea.

## 2014-04-15 ENCOUNTER — Telehealth: Payer: Self-pay | Admitting: *Deleted

## 2014-04-15 NOTE — Telephone Encounter (Signed)
CALLED PATIENT TO INFORM OF SCAN ON 10-13-14 @ [facility] AND HER FU VISIT WITH DR. MANNING ON 10/27/14 @ 4 PM, SPOKE WITH PATIENT'S DAUGHTER - PEGGY PER DR. MANNING REQUEST AND INFORMED OF ALL APPTS., PATIENT'S DAUGHTER IS GOOD WITH ALL THESE APPTS.

## 2014-04-16 ENCOUNTER — Inpatient Hospital Stay: Payer: Self-pay | Admitting: Internal Medicine

## 2014-04-16 DIAGNOSIS — I4891 Unspecified atrial fibrillation: Secondary | ICD-10-CM | POA: Diagnosis not present

## 2014-04-16 DIAGNOSIS — J189 Pneumonia, unspecified organism: Secondary | ICD-10-CM | POA: Diagnosis not present

## 2014-04-16 DIAGNOSIS — I5032 Chronic diastolic (congestive) heart failure: Secondary | ICD-10-CM | POA: Diagnosis not present

## 2014-04-16 DIAGNOSIS — E876 Hypokalemia: Secondary | ICD-10-CM | POA: Diagnosis present

## 2014-04-16 DIAGNOSIS — J159 Unspecified bacterial pneumonia: Secondary | ICD-10-CM | POA: Diagnosis not present

## 2014-04-16 DIAGNOSIS — R0902 Hypoxemia: Secondary | ICD-10-CM | POA: Diagnosis present

## 2014-04-16 DIAGNOSIS — Z87891 Personal history of nicotine dependence: Secondary | ICD-10-CM | POA: Diagnosis not present

## 2014-04-16 DIAGNOSIS — Z951 Presence of aortocoronary bypass graft: Secondary | ICD-10-CM | POA: Diagnosis not present

## 2014-04-16 DIAGNOSIS — J9621 Acute and chronic respiratory failure with hypoxia: Secondary | ICD-10-CM | POA: Diagnosis present

## 2014-04-16 DIAGNOSIS — R0602 Shortness of breath: Secondary | ICD-10-CM | POA: Diagnosis not present

## 2014-04-16 DIAGNOSIS — R911 Solitary pulmonary nodule: Secondary | ICD-10-CM | POA: Diagnosis not present

## 2014-04-16 DIAGNOSIS — J441 Chronic obstructive pulmonary disease with (acute) exacerbation: Secondary | ICD-10-CM | POA: Diagnosis not present

## 2014-04-16 DIAGNOSIS — E039 Hypothyroidism, unspecified: Secondary | ICD-10-CM | POA: Diagnosis present

## 2014-04-16 DIAGNOSIS — E785 Hyperlipidemia, unspecified: Secondary | ICD-10-CM | POA: Diagnosis present

## 2014-04-16 DIAGNOSIS — I10 Essential (primary) hypertension: Secondary | ICD-10-CM | POA: Diagnosis not present

## 2014-04-16 DIAGNOSIS — I2510 Atherosclerotic heart disease of native coronary artery without angina pectoris: Secondary | ICD-10-CM | POA: Diagnosis present

## 2014-04-16 LAB — BASIC METABOLIC PANEL
Anion Gap: 9 (ref 7–16)
BUN: 22 mg/dL — ABNORMAL HIGH (ref 7–18)
CALCIUM: 8.4 mg/dL — AB (ref 8.5–10.1)
CO2: 27 mmol/L (ref 21–32)
CREATININE: 1.13 mg/dL (ref 0.60–1.30)
Chloride: 103 mmol/L (ref 98–107)
EGFR (Non-African Amer.): 50 — ABNORMAL LOW
GFR CALC AF AMER: 60 — AB
Glucose: 166 mg/dL — ABNORMAL HIGH (ref 65–99)
Osmolality: 285 (ref 275–301)
POTASSIUM: 3.1 mmol/L — AB (ref 3.5–5.1)
SODIUM: 139 mmol/L (ref 136–145)

## 2014-04-16 LAB — CBC
HCT: 37.7 % (ref 35.0–47.0)
HGB: 12.7 g/dL (ref 12.0–16.0)
MCH: 29 pg (ref 26.0–34.0)
MCHC: 33.6 g/dL (ref 32.0–36.0)
MCV: 86 fL (ref 80–100)
Platelet: 158 10*3/uL (ref 150–440)
RBC: 4.38 10*6/uL (ref 3.80–5.20)
RDW: 16 % — ABNORMAL HIGH (ref 11.5–14.5)
WBC: 9.9 10*3/uL (ref 3.6–11.0)

## 2014-04-16 LAB — CK TOTAL AND CKMB (NOT AT ARMC)
CK, TOTAL: 120 U/L (ref 26–192)
CK, Total: 107 U/L (ref 26–192)
CK, Total: 134 U/L (ref 26–192)
CK-MB: 1.4 ng/mL (ref 0.5–3.6)
CK-MB: 1.7 ng/mL (ref 0.5–3.6)
CK-MB: 1.8 ng/mL (ref 0.5–3.6)

## 2014-04-16 LAB — TROPONIN I
TROPONIN-I: 0.03 ng/mL
Troponin-I: 0.03 ng/mL
Troponin-I: 0.02 ng/mL

## 2014-04-16 LAB — PRO B NATRIURETIC PEPTIDE: B-TYPE NATIURETIC PEPTID: 756 pg/mL — AB (ref 0–450)

## 2014-04-17 LAB — CBC WITH DIFFERENTIAL/PLATELET
Basophil #: 0 10*3/uL (ref 0.0–0.1)
Basophil %: 0 %
EOS ABS: 0 10*3/uL (ref 0.0–0.7)
Eosinophil %: 0 %
HCT: 40.6 % (ref 35.0–47.0)
HGB: 13.5 g/dL (ref 12.0–16.0)
LYMPHS ABS: 1.1 10*3/uL (ref 1.0–3.6)
Lymphocyte %: 7 %
MCH: 28.6 pg (ref 26.0–34.0)
MCHC: 33.3 g/dL (ref 32.0–36.0)
MCV: 86 fL (ref 80–100)
MONO ABS: 0.4 x10 3/mm (ref 0.2–0.9)
Monocyte %: 2.9 %
Neutrophil #: 13.8 10*3/uL — ABNORMAL HIGH (ref 1.4–6.5)
Neutrophil %: 90.1 %
PLATELETS: 168 10*3/uL (ref 150–440)
RBC: 4.72 10*6/uL (ref 3.80–5.20)
RDW: 16.6 % — AB (ref 11.5–14.5)
WBC: 15.3 10*3/uL — ABNORMAL HIGH (ref 3.6–11.0)

## 2014-04-17 LAB — BASIC METABOLIC PANEL
ANION GAP: 9 (ref 7–16)
BUN: 19 mg/dL — ABNORMAL HIGH (ref 7–18)
CO2: 30 mmol/L (ref 21–32)
CREATININE: 1.08 mg/dL (ref 0.60–1.30)
Calcium, Total: 9 mg/dL (ref 8.5–10.1)
Chloride: 98 mmol/L (ref 98–107)
EGFR (African American): >60
EGFR (Non-African Amer.): 52 — ABNORMAL LOW
Glucose: 244 mg/dL — ABNORMAL HIGH (ref 65–99)
Osmolality: 284 (ref 275–301)
Potassium: 3.5 mmol/L (ref 3.5–5.1)
Sodium: 137 mmol/L (ref 136–145)

## 2014-04-18 LAB — POTASSIUM: Potassium: 2.8 mmol/L — ABNORMAL LOW (ref 3.5–5.1)

## 2014-05-08 ENCOUNTER — Inpatient Hospital Stay: Payer: Self-pay | Admitting: Internal Medicine

## 2014-06-02 DIAGNOSIS — E039 Hypothyroidism, unspecified: Secondary | ICD-10-CM | POA: Diagnosis not present

## 2014-06-02 DIAGNOSIS — I110 Hypertensive heart disease with heart failure: Secondary | ICD-10-CM | POA: Diagnosis not present

## 2014-06-02 DIAGNOSIS — I059 Rheumatic mitral valve disease, unspecified: Secondary | ICD-10-CM | POA: Diagnosis not present

## 2014-06-02 DIAGNOSIS — R7309 Other abnormal glucose: Secondary | ICD-10-CM | POA: Diagnosis not present

## 2014-06-02 DIAGNOSIS — I509 Heart failure, unspecified: Secondary | ICD-10-CM | POA: Diagnosis not present

## 2014-06-02 DIAGNOSIS — I2510 Atherosclerotic heart disease of native coronary artery without angina pectoris: Secondary | ICD-10-CM | POA: Diagnosis not present

## 2014-06-02 DIAGNOSIS — E780 Pure hypercholesterolemia: Secondary | ICD-10-CM | POA: Diagnosis not present

## 2014-06-02 DIAGNOSIS — I359 Nonrheumatic aortic valve disorder, unspecified: Secondary | ICD-10-CM | POA: Diagnosis not present

## 2014-06-16 DIAGNOSIS — M25512 Pain in left shoulder: Secondary | ICD-10-CM | POA: Diagnosis not present

## 2014-06-16 DIAGNOSIS — M7542 Impingement syndrome of left shoulder: Secondary | ICD-10-CM | POA: Diagnosis not present

## 2014-06-30 DIAGNOSIS — M810 Age-related osteoporosis without current pathological fracture: Secondary | ICD-10-CM | POA: Diagnosis not present

## 2014-07-12 NOTE — H&P (Signed)
PATIENT NAME:  Marissa [name], Marissa [name] MR#:  [id number] DATE OF BIRTH:  08-19-1933  DATE OF ADMISSION:  11/03/2013  PRIMARY CARE PHYSICIAN: Dr. Jimmye Norman  REFERRING PHYSICIAN: Dr. Jimmye Norman   CHIEF COMPLAINT: Shortness of breath today.   HISTORY OF PRESENT ILLNESS: A 81 year old Caucasian female with a history of hypertension, MI, COPD presented to the ED with shortness of breath today. The patient is alert, awake, oriented, in no acute distress. The patient said that she has had a cough, sputum on and off, but developed shortness of breath since this morning. The patient denies any fever or chills. Denies any leg swelling, weight gain, no weight loss, but has orthopnea. The patient denies any other symptoms. She was noticed to have atrial fibrillation and elevated BNP.   PAST MEDICAL HISTORY: Hypertension, MI, COPD, hypothyroidism, heart valve replacement, prediabetes.   PAST SURGICAL HISTORY: Heart valve replacement, appendectomy, hysterectomy.  SOCIAL HISTORY: Denies any smoking or drinking or illicit drugs.   FAMILY HISTORY: No hypertension, heart attack.   ALLERGIES: PROZAC.   HOME MEDICATIONS: Medication reconciliation list is not done yet, we will follow up.   REVIEW OF SYSTEMS:  CONSTITUTIONAL: The patient denies any fever or chills. No headache or dizziness, but has weakness.  EYES: No double vision or blurred vision. EARS, NOSE, AND THROAT: No postnasal drip, slurred speech or dysphagia.  CARDIOVASCULAR: No chest pain, palpitation, but has orthopnea. No nocturnal dyspnea. No leg edema.  PULMONARY: Positive for cough, sputum, shortness of breath, but no wheezing or hemoptysis.  GASTROINTESTINAL: No abdominal pain, nausea, vomiting, diarrhea. No melena or bloody stool.  GENITOURINARY: No dysuria, hematuria or incontinence.  SKIN: No rash or jaundice.  ENDOCRINE: No polyuria or polydipsia, heat or cold intolerance.  HEMATOLOGY: No easy bruising or bleeding.   PHYSICAL EXAMINATION:   VITAL SIGNS: Temperature 98.1, blood pressure was 206/98 and decreased to 189/82, pulse 69, oxygen saturation 95% on room air.  GENERAL: The patient is alert, awake, oriented, in no acute distress.  HEENT: Pupils round, equal and reactive to light and accommodation. Moist oral mucosa. Clear oropharynx.  NECK: Supple. No JVD or carotid bruit. No lymphadenopathy. No thyromegaly.  CARDIOVASCULAR: S1, S2, irregular rate, rhythm. No murmur, gallop. PULMONARY: Bilateral air entry. Weak breath sounds with mild crackles but no wheezing, no rhonchi.  ABDOMEN: Soft. No distention or tenderness. No organomegaly. Bowel sounds present.  EXTREMITIES: No edema, clubbing or cyanosis. No calf tenderness. Bilateral pedal pulses present.  SKIN: No rash or jaundice.  NEUROLOGIC: A and O x 3. No focal deficit. Power 5/5. Sensory intact.   DIAGNOSTIC DATA: Chest x-ray shows:  1.  A 1 cm nodular density projecting in the right upper lobe.  2.  Cardiomegaly without acute disease.  EKG showed atrial fibrillation with computing junctional pacemaker, incomplete left bundle branch block.   LABORATORY DATA: CK 93, CK-MB 1.3, INR 1.1, BNP 1245. CBC in normal range. Troponin less than 0.02. Glucose 96, BUN 10, creatinine 0.78. Electrolytes are normal. Digoxin 0.67. TSH 0.204. Thyroxine 1.33.   IMPRESSIONS:  1.  Atrial fibrillation, rate controlled.  2.  Acute congestive heart failure.  3.  Hypertension malignancy.  4.  Pulmonary nodule in the right lung.  5.  Coronary artery disease.  6.  Chronic obstructive pulmonary disease. 7.  Prediabetes.  8.  Hypothyroidism.   PLAN OF TREATMENT:  1.  The patient will be admitted to telemetry floor. We will continue telemonitor, start CHF protocol, start Lasix 40 mg IV b.i.d.  2.  For atrial fibrillation, we will start a heparin drip, give Coreg. We will start DuoNeb. Check echocardiograph and a cardiology consult.  3.  For hypertension, we will start lisinopril and Coreg,  hydralazine IV p.r.n. In addition, we will start aspirin and statin.  4.  Check hemoglobin A1c, start a sliding scale.   I discussed the patient's condition and plan of treatment with the patient and the patient's daughter.   TIME SPENT: About 56 minutes.   CODE STATUS: The patient wants full code.    ____________________________ Demetrios Loll, MD qc:TT D: 11/03/2013 18:48:18 ET T: 11/03/2013 19:10:01 ET JOB#: [id number]  cc: Demetrios Loll, MD, <Dictator> Demetrios Loll MD ELECTRONICALLY SIGNED 11/04/2013 13:12

## 2014-07-12 NOTE — Consult Note (Signed)
General Aspect Primary Cardiologist: M. Skains, MD _____________  81 y/o female with a h/o CAD and rheumatic heart dzs s/p CABG and MVR x 2/AVR x 1, who was admitted yesterday with hypertensive urgency and CHF. _____________  Past Medical History  1.  Rheumatic Heart Disease      a. S/P MVR (? 1990's) and redo MVR (03/2011 [place], [place] - 76m Porcine Tissue Valve).      b. S/P AVR (03/2011 [place], [place] - 269mPorcine Tissue Valve). 2.  CAD      a. S/P CABG (? 1990's LIMA->LAD, VG->RCA) and Redo CABG (03/2011 @ time of MVR/AVR, VG->RCA) 3.  Mod-Severe Pulmonary HTN 4.  HTN 5.  COPD 6.  Hypothyroidism 7.  Permanent Afib      a. Bleeding of right ear w/ coumadin-->not willing to consider NOAC. 8.  Borderline DM 9.  S/P Hysterectomy 10.  S/P Appendectomy _____________   Present Illness [age]/o female with the above complex problem list.  Most notably, she has a h/o rheumatic heart dzs and CAD.  [place] CAB[place] x 2 [place] 90[place] unsure [place] January of [place], she required redo CABG x 1 (VG->RCA), redo MVR, and AVR.  She has a h/o permanent afib and was on coumadin in the past however came off of it 2/2 bleeding of the right ear.  When she last saw [facility] in June of this year, she was unwilling to consider a NOAC.  [place]  She says that at baseline she is able to do some light housework and grocery shopping, though mostly, she watches TV.  Over the past week, she has noted some increase in DOE and yesterday morning she awoke acutely dyspneic.  [facility]82[facility] and BNP was elevated @ 1245.  Trop was nl.  CXR showed cardiomegaly w/o acute dzs.  She was admitted for diuresis and BP mgmt.  Her home dose of BB has been switched from Toprol to coreg.  BP is better this AM and currently she denies dyspnea.  Troponins have remained negative.   Physical Exam:  GEN  Pleasant, NAD.   HEENT pink conjunctivae, moist oral mucosa   NECK supple  No masses  No bruits/jvd.   RESP normal resp effort  Diminished breath sounds @ the bases with bibasilar crackles.   CARD Irregular rate and rhythm  2/6 SEM RUSB   ABD denies tenderness  no hernia  normal BS   LYMPH negative neck   EXTR negative cyanosis/clubbing, negative edema   SKIN normal to palpation   NEURO cranial nerves intact, motor/sensory function intact   PSYCH alert, A+O to time, place, person   Review of Systems:  General: No Complaints   Skin: No Complaints   ENT: No Complaints   Eyes: No Complaints   Neck: No Complaints   Respiratory: Short of breath   Cardiovascular: Dyspnea  Orthopnea   Gastrointestinal: No Complaints   Genitourinary: No Complaints   Vascular: No Complaints   Musculoskeletal: No Complaints   Neurologic: No Complaints   Hematologic: No Complaints   Endocrine: No Complaints   Psychiatric: No Complaints   Review of Systems: All other systems were reviewed and found to be negative   Medications/Allergies Reviewed Medications/Allergies reviewed   Family & Social History:  Family and Social History:  Family History Mother died @  33 with Tetanus.  Father died in his 30's of unknown cause.   Social History Previously a light smoker but none in many years.  No etoh/drugs.   Place of Living Home  [place]   Home Medications: Medication Instructions Status  aspirin 81 mg oral tablet 1 tab(s) orally once a day Active  atorvastatin 40 mg oral tablet 1 tab(s) orally once a day (at bedtime) Active  Metoprolol Succinate ER 25 mg oral tablet, extended release 1 tab(s) orally once a day Active  Lanoxin 125 mcg (0.125 mg) oral tablet 1 tab(s) orally once a day Active  levothyroxine 100 mcg (0.1 mg) oral tablet 1 tab(s) orally once a day Active  Micardis 40 mg oral tablet 1 tab(s) orally once a day Active  Spiriva 18 mcg inhalation  capsule 1 cap(s) via handihaler once a day Active  Combivent Respimat CFC free 100 mcg-20 mcg/inh inhalation aerosol 1 puff(s) inhaled 4 times a day Active  Boniva 150 mg oral tablet 1 tab(s) orally once a month Active   Lab Results:  Routine Chem:  17-Aug-15 03:43   B-Type Natriuretic Peptide [facility])  1883 (Result(s) reported on 04 Nov 2013 at 04:14AM.)  Cholesterol, Serum 81  Triglycerides, Serum 188  HDL (INHOUSE)  28  VLDL Cholesterol Calculated 38  LDL Cholesterol Calculated 15 (Result(s) reported on 04 Nov 2013 at 04:14AM.)  Glucose, Serum  143  BUN 9  Creatinine (comp) 0.70  Sodium, Serum 136  Potassium, Serum 3.7  Chloride, Serum 103  CO2, Serum 26  Calcium (Total), Serum 8.6  Anion Gap 7  Osmolality (calc) 273  eGFR (African American) >60  eGFR (Non-African American) >60 (eGFR values <80m/min/1.73 m2 may be an indication of chronic kidney disease (CKD). Calculated eGFR is useful in patients with stable renal function. The eGFR calculation will not be reliable in acutely ill patients when serum creatinine is changing rapidly. It is not useful in  patients on dialysis. The eGFR calculation may not be applicable to patients at the low and high extremes of body sizes, pregnant women, and vegetarians.)  Result Comment CREATININE/POTASSIUM/MG - Slight hemolysis, interpret results with  - caution.  Result(s) reported on 04 Nov 2013 at 04:14AM.  Magnesium, Serum 1.8 (1.8-2.4 THERAPEUTIC RANGE: 4-7 mg/dL TOXIC: > 10 mg/dL  -----------------------)  Hemoglobin A1c ([facility]) 6.0 (The American Diabetes Association recommends that a primary goal of therapy should be <7% and that physicians should reevaluate the treatment regimen in patients with HbA1c values consistently >8%.)   EKG:  EKG Interp. by me   Interpretation afib, 69, IVCD, poor r prog, mild inflat ST dep - no change from ECG 08/2013 office visit (Epic)   Radiology Results: XRay:    16-Aug-15 16:44, Chest PA and  Lateral  Chest PA and Lateral   REASON FOR EXAM:    SOB  COMMENTS:       PROCEDURE: DXR - DXR CHEST PA (OR AP) AND LATERAL  - Nov 03 2013  4:44PM     CLINICAL DATA:  Shortness of breath.  Former smoker.    EXAM:  CHEST  2 VIEW    COMPARISON:  None.    FINDINGS:  There is cardiomegaly without edema. A 1 cm nodular opacity projects  in the right upper lobe on the PA view only. Focus of increased  density projecting of the left ventricle is also identified. The  patient is status post aortic and mitral valve replacement. There is  no pneumothorax  or pleural effusion. The chest is hyperexpanded.     IMPRESSION:  1 cm nodular density projecting in the right upper lobe. Nonemergent  chest CT with contrast could be used for further evaluation.    Cardiomegaly without acute disease.      Electronically Signed    By: Inge Rise M.D.    On: 11/03/2013 16:55     Verified By: Ramond Dial, M.D.,    Prozac: Unknown  Vital Signs/Nurse's Notes: **Vital Signs.:   17-Aug-15 00:54  Vital Signs Type Recheck  Systolic BP Systolic BP 448  Diastolic BP (mmHg) Diastolic BP (mmHg) 98  Mean BP 124    04:03  Vital Signs Type Routine  Temperature Temperature (F) 98  Celsius 36.6  Temperature Source oral  Pulse Pulse 69  Respirations Respirations 20  Systolic BP Systolic BP 185  Diastolic BP (mmHg) Diastolic BP (mmHg) 67  Mean BP 90  Pulse Ox % Pulse Ox % 91  Pulse Ox Activity Level  At rest  Oxygen Delivery Room Air/ 21 %  *Intake and Output.:   Daily 17-Aug-15 07:00  Grand Totals Intake:   Output:  600    Net:  -600 24 Hr.:  -600  Urine ml     Out:  600  Length of Stay Totals Intake:   Output:  600    Net:  -600    Impression 1.  Hypertensive Urgency/Acute Diastolic CHF:  Pt presented yesterday with marked HTN and dyspnea.  BNP was elevated.  BP trended high throughout the day yesterday but currently better after getting coreg last night and micardis this AM.   She continues to have bibasilar crackles.  Neck veins are flat. Continue IV lasix today - suspect we'll be able to switch to PO tomorrow.  She was on lasix 57m daily @ home.  Echo pending today.  2.  CAD:  S/P CABG and redo CABG in 03/2011.  No chest pain.  Troponins negative.  Cont ASA, Statin, BB.  3.  Permanent Afib:  She was unaware that she is always in afib.  Though this is well documented in 08/2013 office visit and ECGs from 08/2013 and 02/2013 both document rate controlled afib.  Cont bb and digoxin for rate control.  Cont ASA.  D/C heparin as she is not willing to take oral anticoagulant as an outpt.  4.  Hypothyroidism:  She is on synthroid @ home.  Replacement per IM.  TSH mildly suppressed but FT4 wnl.  5. Rheumatic Heart Dzs:  S/P Porcine AVR/MVR 03/2011.  Echo today.  6.  COPD:  Inhalers per IM.  7.  RUL Nodular Density:  1cm.  Will need CT in f/u.   Electronic Signatures for Addendum Section:  AKathlyn Sacramento(MD) (Signed Addendum 17-Aug-15 13:54)  The patient was seen and examined. Agree with the above. She presented with acute dyspnea and was found to be fluid overloaded and hypertensive on presenation. Previous AVR/MVR and 1 vessel CABG. She improved with IV Lasix. Echo showed normal EF with normal functioning aortic and mitral valve prosthesis. Significant diastolic dysfunction with moderate pulmonary hypertension.  Continue IV Lasix. Agree with BP control. Should strongly consider resuming anticoagulation with Warfarin as she is at high risk for thromboembolic complications related to A-fib.   Electronic Signatures: AKathlyn Sacramento(MD)  (Signed 17-Aug-15 13:54)  Co-Signer: General Aspect/Present Illness, History and Physical Exam, Review of System, Family & Social History, Home Medications, Labs, EKG , Radiology, Allergies, Vital Signs/Nurse's Notes, Impression/Plan  Rogelia Mire (NP)  (Signed 17-Aug-15 10:07)  Authored: General Aspect/Present Illness, History and  Physical Exam, Review of System, Family & Social History, Home Medications, Labs, EKG , Radiology, Allergies, Vital Signs/Nurse's Notes, Impression/Plan   Last Updated: 17-Aug-15 13:54 by Kathlyn Sacramento (MD)

## 2014-07-12 NOTE — Discharge Summary (Signed)
PATIENT NAME:  Marissa [name], Marissa [name] MR#:  [id number] DATE OF BIRTH:  07/03/1933  DATE OF ADMISSION:  11/03/2013 DATE OF DISCHARGE:  11/06/2013  ADMITTING DIAGNOSIS:  Acute diastolic congestive heart failure exacerbation.  ASSOCIATED DIAGNOSES:  1.  Hypertension.  2.  Chronic obstructive pulmonary disease without exacerbation.  3.  Atrial fibrillation.  4.  Coronary artery disease.  5.  Hypothyroidism.  6.  Pulmonary hypertension.  7.  Hyperglycemia.  8.  Mitral and aortic valve replacement.  9. Pulmonary nodule seen on chest x-ray, which will need to be followed by CT scan as an outpatient.   CONSULTATIONS: Dr. Jacqulyn Cane, cardiology, Dr. Ida Rogue, cardiology, Dr. Glenetta Hew, cardiology.   PROCEDURES:   1.  A 2D echocardiogram shows left ventricular ejection fraction 55% to 60%, normal global left systolic function, elevated left ventricular and diastolic pressures, moderate concentric left ventricular hypertrophy, restrictive pattern of the LV diastolic filling, moderately dilated left atrium.  2.  Chest x-ray August 16, shows cardiomegaly without acute disease.   HISTORY OF PRESENT ILLNESS: This pleasant 81 year old woman with a history of hypertension, coronary artery disease, COPD, presents to the Emergency Room with shortness of breath. She is alert, awake, oriented and in no distress. She states that she has had a cough with sputum, but developed shortness of breath since the morning of admission. She denied fever and no chills. She was noticed to have atrial fibrillation and elevated blood pressure, as well as elevated BNP and was admitted.   HOSPITAL COURSE AND TREATMENT: 1.  Acute diastolic congestive heart failure exacerbation: The patient was followed by cardiology throughout her admission. Upon presentation, she was extremely hypertensive, which likely played a role in her CHF exacerbation. She was diuresed with IV Lasix with good result. She will continue on home dose  Lasix after discharge 40 mg twice a day. She will continue with aspirin, Coreg, and an ARB. A 2D echocardiogram shows preserved systolic function with diastolic dysfunction. She is advised to follow up with her cardiologist, Dr. Candee Furbish,  within 1 week of discharge.  2.  Hypertension: Upon presentation, the patient was hypertensive with blood pressures in the 200/80 range. Blood pressure resolved well with diuresis and on discharge is 111/68.  She is discharged on her home antihypertensives Micardis and furosemide. Note that carvedilol has been added and metoprolol discontinued from her medication list.  3.  Chronic obstructive pulmonary disease without exacerbation. The patient is to continue with Spiriva and Combivent after discharge. She did not have any wheezing or cough during hospitalization..  4.  Atrial fibrillation:  The patient was rate controlled throughout admission. She was not on anticoagulation on admission due to a history of bleeding in the year with Coumadin. The risk stroke was discussed at length with the patient who did not realize that she was permanently in atrial fibrillation. She agreed to attempt anticoagulation with Eliquis at low dose. If there is no further bleeding, would consider titrating up to 5 mg. She will discuss this with her cardiologist. , 5.  Coronary artery disease: She had no chest pain throughout the admission. There were no EKG changes or elevation in cardiac enzymes. She will continue with statin, aspirin, beta blocker.  6.  Hypothyroidism.  Continue Synthroid.  7.  Pulmonary hypertension due to chronic obstructive pulmonary disease.  8.  Pulmonary nodule:  Seen on chest x-ray. This should be followed by a CT scan as an outpatient.  9.  Hyperglycemia: The patient's hemoglobin A1c is  6.0. She is not diabetic and does not need further treatment for blood sugars.  10.  Mitral and aortic valve replacements (porcine).   DISCHARGE PHYSICAL EXAMINATION: VITAL  SIGNS: Temperature 98.8, pulse 60, respirations 20, blood pressure 111/68, oxygen saturation 90% on room air.  GENERAL: Alert, oriented, sitting up in bed. Conversing comfortably.  RESPIRATORY: Normal respiratory effort, bibasilar crackles. Good air movement.  CARDIOLOGY:  Irregular rate, rate controlled. There is a 4/6 systolic ejection murmur, no lower extremity edema, no JVD.  ABDOMEN: Soft, nontender, normal bowel sounds.  NEUROLOGIC: Cranial nerves are intact.  Motor and sensory function are intact.   PSYCHIATRIC: The patient is alert and oriented to time, place, and person. Has good insight. No signs of uncontrolled depression or anxiety.  LABORATORY DATA:  Sodium 140, potassium 3.9, chloride 101, bicarbonate 29, BUN 31, creatinine 1.36. White blood cell count 12.3, hemoglobin 14.3, platelets 189,000, MCV 88. TSH is 0.2, FT4 is 1.3. BNP on admission 12,045.   CONDITION ON DISCHARGE: Stable.   DISPOSITION: The patient is discharged to home with family care.   DISCHARGE MEDICATIONS: 1.  Atorvastatin 40 mg daily.  2.  Lanoxin 125 mcg 1 tablet daily.  3.  Levothyroxine 100 mcg 1 tablet daily.  4.  Micardis 40 mg 1 tablet daily.  5.  Spiriva 18 mcg 1 capsule via HandiHaler once a day.  6.  Combivent CFC free 100 mcg-20 mcg inhalation inhaler 1 puff inhaled 4 times a day.  7.  Boniva 150 mg oral tablet once a month.  8.  Apixaban 2.5 mg 1 tablet twice a day.  9.  Carvedilol 6.25 mg 1 tablet twice a day.  10.  Furosemide 40 mg 1 tablet twice a day.  11.  K-Tab 20 mEq 1 tablet once a day at bedtime.   DIET: Low-sodium heart healthy diet.   ACTIVITY:  No restrictions.   FOLLOWUP:  The patient is advised to follow up with her primary care provider within 1-2 weeks of discharge. The patient is advised to follow up with her cardiologist within 1-2 weeks of discharge.     ____________________________ Earleen Newport. Volanda Napoleon, MD cpw:DT D: 11/06/2013 17:33:08 ET T: 11/06/2013 19:39:16  ET JOB#: [id number]  cc: Barnetta Chapel P. Volanda Napoleon, MD, <Dictator> Aldean Jewett MD ELECTRONICALLY SIGNED 11/14/2013 13:16

## 2014-07-12 NOTE — H&P (Signed)
PATIENT NAME:  Marissa [name], Marissa [name] MR#:  [id number] DATE OF BIRTH:  24-Oct-1933  DATE OF ADMISSION:  11/03/2013  ADDENDUM:  ALLERGIES: PROZAC.   HOME MEDICATIONS: Spiriva 18 mcg inhalation 1 cap once a day, Micardis 40 mg p.o. daily, Lopressor 25 mg p.o. daily, levothyroxine 100 mcg p.o. daily, digoxin 125 mcg p.o. daily, Combivent Respimat CFC 100 mcg/20 mcg 1 puff 4 times a day, Boniva 150 mg p.o. once a month, atorvastatin 40 mg p.o. at bedtime, aspirin 81 mg p.o. daily.    ____________________________ Demetrios Loll, MD qc:TT D: 11/03/2013 21:14:01 ET T: 11/03/2013 21:33:44 ET JOB#: [id number]  cc: Demetrios Loll, MD, <Dictator> Demetrios Loll MD ELECTRONICALLY SIGNED 11/04/2013 13:36

## 2014-07-20 NOTE — Discharge Summary (Signed)
PATIENT NAME:  Marissa [name], SKORUPSKI MR#:  [id number] DATE OF BIRTH:  [birth year]  DATE OF ADMISSION:  04/16/2014 DATE OF DISCHARGE:  04/18/2014  DISCHARGE DIAGNOSES: 1. Acute hypoxia secondary to chronic obstructive pulmonary disease flare and also pneumonia.  2. Coronary artery disease.  DISCHARGE MEDICATIONS: Atorvastatin 140 mg p.o. daily, digoxin 125 mcg p.o. daily, levothyroxine 100 mcg p.o. daily, Coreg 3.125 mg p.o. b.i.d., furosemide 40 mg p.o. daily in the morning and 1/2 tablet in the evening. Potassium chloride 20 mEq p.o. daily, Requip 4 mgtablet p.o. b.i.d., vitamin C 500 units daily, aspirin 81 mg p.o. daily, albuterol nebulizer every 6 hours as needed, Combivent Respimat 100 mcg/20 mcg 1 puff b.i.d., Augmentin 875/125 mg 1 tablet p.o. b.i.d., Advair Diskus HFA 1 puff b.i.d., prednisone 20 mg 3 tablets daily for 2 days, 2 tablets daily for 2 days, 1 tablet daily for 2 days.   HOME OXYGEN: Discharged home with 2 liters of oxygen. The patient needed oxygen on ambulation. The patient's oxygen saturation dropped to 88% on ambulation on room air, so we discharged her home with oxygen and advised her to use it. The patient's oxygen saturation on room air at rest 94%, on exertion dropped to 86%.   CONSULTATIONS: None.   HOSPITAL COURSE: This patient is a 81 year old female patient who came in because of shortness of breath. She had no orthopnea or paroxysmal nocturnal dyspnea, but had some shortness of breath associated with some cough and phlegm. Oxygen saturations were dropped to 80% on walking, so she is admitted because of chronic obstructive pulmonary disease exacerbation and pneumonia. She has right lower lobe pneumonia and exudate. She received IV Rocephin Zithromax, steroids, nebulizers, and her wheezing improved; however, she continued to require oxygen on ambulation. The patient felt better and she wanted to go home. Discharged her home with oxygen. She has a history of severe emphysema,  so we have asked her to follow up with Dr. Stevenson Clinch from [facility] to establish a pulmonary physician to evaluate for her ongoing chronic obstructive pulmonary disease and oxygen needs. The patient has a lot of  history of mitral and aortic valve replacement with bovine valves. She use to take Eliquis, but she stopped it, and she has aspirin and also statins and Coreg that she takes, so we advised her to continue that. The patient has a history of hypothyroidism. We continued levothyroxine 100 mcg p.o. daily,   PHYSICAL EXAMINATION: AT DISCHARGE: DISCHARGE VITAL SIGNS: Temperature 97.6, heart rate 78, blood pressure 147/60, saturations 96% on 2 liters at rest and 86% with exertion on room air. The patient's oxygen saturations improved to 96% on 2 liters on exertion. She has hypokalemia, but she is getting potassium supplements with Lasix.  CARDIOVASCULAR SYSTEM: S1, S2 regular and has ejection systolic murmur in the aortic area.  LUNGS: Clear to auscultation. No wheeze, no rales.  ABDOMEN: Soft, nontender, nondistended. Bowel sounds present.   CONDITION AT DISCHARGE: She went home in stable condition.   TIME SPENT ON DISCHARGE PREPARATION: More than 30 minutes    ____________________________ Epifanio Lesches, MD sk:mw D: 04/19/2014 09:47:13 ET T: 04/19/2014 19:27:46 ET JOB#: [id number]  cc: Epifanio Lesches, MD, <Dictator> Epifanio Lesches MD ELECTRONICALLY SIGNED 04/23/2014 15:33

## 2014-07-20 NOTE — H&P (Signed)
PATIENT NAME:  Marissa [name], CALVI MR#:  [id number] DATE OF BIRTH:  [birth year]  DATE OF ADMISSION:  04/16/2014  PMD from [facility];pt does not know the name  CHIEF COMPLAINT: Shortness of breath.   HISTORY OF PRESENT ILLNESS: The patient is a 81 year old female patient with a history of hypertension, coronary artery disease, comes in because of shortness of breath. The patient noted to have shortness of breath worse since this morning, associated with cough and phlegm. The patient denies any chest pain. No orthopnea. No PND. No pedal edema. Denies any fever. The patient was found to have right-sided pneumonia and O2 saturations were dropping to 80s when she was walking. That is the reason we are admitting the patient. The patient's oxygen saturations are 91% at rest on room air.   PAST MEDICAL HISTORY: Significant for: 1. Hypertension.  2. Hyperlipidemia.  3. COPD.  4. CAD, status post CABG and also a history of mitral and aortic valve replacements with pig valves.  5. Also, she has a history of hypothyroidism.   MEDICATIONS AT HOME: Micardis 40 mg p.o. daily, levothyroxine 100 mcg p.o. daily, Lanoxin 125 mcg p.o. daily, K-Tab 20 mEq p.o. at bedtime, (omeprazole 40 mg p.o. b.i.d., Combivent Respimat 100/20 one puff 4 times daily, Coreg 6.25 mg p.o. b.i.d., (Boniva  150 mg once a month, atorvastatin 40 mg p.o. daily, apixaban 2.5 mg p.o. b.i.d.   SOCIAL HISTORY: Occasional smoking, 1 pack lasts for 1 week, but quit a long time back. No alcohol. No drugs. Lives with daughter.   PAST SURGICAL HISTORY: Significant for history of coronary artery disease CABG, history of mitral and aortic valve replacements before.   FAMILY HISTORY: Father had heart disease died of heart disease.    REVIEW OF SYSTEMS:  CONSTITUTIONAL: Denies any fever or fatigue.  HEAD: Has no headache.  EARS, NOSE, AND THROAT: No tinnitus. No epistaxis. No difficulty swallowing.  CARDIOVASCULAR: No chest pain. No palpitations.  No syncope.  PULMONARY: Complains of trouble breathing, cough, and phlegm.  GASTROINTESTINAL: No nausea. No vomiting. No diarrhea. No constipation. No hiatal hernias.  GENITOURINARY: No dysuria.  ENDOCRINE: No polyuria or nocturia. Denies any heat or cold intolerance.  NEUROLOGIC: No history of strokes or TIAs. No seizures. Does complain of numbness in the legs occasionally.  PSYCHOLOGICAL: No anxiety or depression.   PHYSICAL EXAMINATION: VITAL SIGNS: Temperature 97.9, heart rate 62, blood pressure 161/73, saturations 92% on room air at rest, and saturations are dropping to 85 to 86 on walking on room air.  GENERAL: This is a 81 year old female seen, she is slightly anxious.  HEAD: Atraumatic, normocephalic.  EYES: Pupils equal, reacting to light. Extraocular movements are intact.  EARS, NOSE, AND THROAT: No tympanic membrane congestion. No turbinate hypertrophy. No oropharyngeal edema. NECK: Supple. No JVD. No carotid bruit.  CARDIOVASCULAR: S1, S2 regular. The patient has an ejection systolic murmur in the aortic area. PMI not displaced. Peripheral pulses are equal, and  carotids, dorsalis pedis, and femoral pulses.  LUNGS: The patient is a mild expiratory wheeze in all lung fields, not using accessory muscles for respiration.  ABDOMEN: Soft, nontender, nondistended. Bowel sounds present. No organomegaly. No hernias. No CVA tenderness.  MUSCULOSKELETAL: The patient is able to move all extremities x 4. No cyanosis or clubbing.  SKIN: On inspection, warm and dry, no skin ulcers.  NEUROLOGIC: Cranial nerves II through XII are intact. Power is 5/5 upper and lower extremities sensation  are intact, DP are 2+ bilaterally.  PSYCHIATRIC:  ae-wake,alert,oriented LYMPHATICS: No lymphadenopathy in cervical or axillary regions.   LABORATORY DATA: Chest x-ray shows a right upper lobe nodule seen, like from before. Atelectasis in the right lung base. Troponins 0.03, CK total 120, CPK-MB 1.7. WBC 9.9,  hemoglobin 12.7, hematocrit 37.7, platelets 158. Electrolytes: Sodium 139, potassium 3.9, chloride 103, bicarbonate 27, BUN 22, creatinine 1.13, glucose 166. BNP 756. EKG: Normal sinus rhythm with no ST or T changes.   ASSESSMENT AND PLAN: A 81 year old female with shortness of breath, cough, and hypoxia on ambulation.  1. The patient is admitted to hospitalist service for bacterial pneumonia in the right lobe. She is on Rocephin, Zithromax, and also she does have a mild chronic obstructive pulmonary disease flare, so continue oxygen, Solu-Medrol, and nebulizers. Continue to check oxygen saturations on exertion to see if she qualifies for home oxygen versus continue monitoring here.  2. Hypertension. Continue her home medications. 3. Coronary artery disease with the mitral and aortic valve replacements. She has Eliquis, so we are going to continue that.  4. History of hypothyroidism. Continue Synthroid.  5. Chronic systolic heart failure.  6. Chronic lung nodule.  7. The patient was here in August of last year. Previous echocardiogram in August last year showed ejection fraction of 55%  to 60%.   TIME SPENT ON ADMISSION: More than 30 minutes.     ____________________________ Epifanio Lesches, MD sk:mw D: 04/16/2014 11:40:04 ET T: 04/16/2014 12:09:35 ET JOB#: [id number]  cc: Epifanio Lesches, MD, <Dictator> Epifanio Lesches MD ELECTRONICALLY SIGNED 04/23/2014 15:52

## 2014-08-11 ENCOUNTER — Encounter: Payer: Self-pay | Admitting: Cardiology

## 2014-08-11 ENCOUNTER — Ambulatory Visit (INDEPENDENT_AMBULATORY_CARE_PROVIDER_SITE_OTHER): Payer: Medicare Other | Admitting: Cardiology

## 2014-08-11 VITALS — BP 159/59 | HR 70 | Ht 61.0 in | Wt 152.0 lb

## 2014-08-11 DIAGNOSIS — Z954 Presence of other heart-valve replacement: Secondary | ICD-10-CM

## 2014-08-11 DIAGNOSIS — I350 Nonrheumatic aortic (valve) stenosis: Secondary | ICD-10-CM

## 2014-08-11 DIAGNOSIS — I2510 Atherosclerotic heart disease of native coronary artery without angina pectoris: Secondary | ICD-10-CM

## 2014-08-11 DIAGNOSIS — Z952 Presence of prosthetic heart valve: Secondary | ICD-10-CM

## 2014-08-11 DIAGNOSIS — I10 Essential (primary) hypertension: Secondary | ICD-10-CM

## 2014-08-11 DIAGNOSIS — I480 Paroxysmal atrial fibrillation: Secondary | ICD-10-CM | POA: Diagnosis not present

## 2014-08-11 DIAGNOSIS — I2583 Coronary atherosclerosis due to lipid rich plaque: Secondary | ICD-10-CM

## 2014-08-11 NOTE — Progress Notes (Signed)
Marissa [name]. [address], [place]  [address] Phone: [phone] Fax:  [phone]  Date:  08/11/2014   ID:  Marissa [name], DOB 1933-11-21, MRN [id number]  PCP:  [name], MD   History of Present Illness: Marissa [name] is a 81 y.o. female with coronary artery disease status post bypass surgery with redo bypass surgery in January of 2013, COPD, aortic valve replacement in 2013 with 21 mm porcine valve, history of rheumatic fever with mitral valve replacement in 2013 with 27 mm porcine valve, history of atrial fibrillation, pulmonary hypertension, heart failure, with right upper lobe lung mass, hypermetabolic suspicious for malignancy appearing to be a stage IA lesion, radiation oncology treatment here for follow up.   Coronary bypass surgery was performed in [facility]. In review of history and physical from January of 2013, she had moderate aortic regurgitation, moderate to severe pulmonary hypertension, right coronary artery was occluded, patent right coronary bypass graft with some irregularity, LIMA to LAD patent.   She had a chronic long-standing atrial fibrillation history but has been intolerant to anticoagulants. Coumadin very small doses have caused bleeding into her ear, she states. She's been reluctant to try anticoagulantion again because of this. She has never had a stroke. Her redo bypass involved a saphenous vein graft to the RCA and redo mitral valve replacement.  Last admitted with diastolic heart failure in the setting of labile hypertension at [facility] 8/16 through 8/19. IV diuresis with Lasix. Normal ejection fraction, normal functioning mitral valve and aortic valve prosthesis. Eliquis at that time was started but she stopped taking.  Micardis was discontinued secondary to low blood pressure, Lasix was changed to 40 a.m., 20 p.m.  She's not having any shortness of breath, no chest pain, no strokelike  symptoms, no orthopnea. She did state however that she's been having some numbness sensation in her bilateral feet, not exacerbated by motion, constant (peripheral neuropathy like). She feels like she gets better circulation to her legs when she massages her calves. Her dorsalis pedis pulse were normal bilaterally.    Wt Readings from Last 3 Encounters:  08/11/14 152 lb (68.947 kg)  04/14/14 150 lb 3.2 oz (68.13 kg)  02/10/14 145 lb (65.772 kg)     Past Medical History  Diagnosis Date  . HTN (hypertension)   . Hypercholesteremia   . CAD (coronary artery disease)     s/p CABG 03/2011  . Aortic valve disorders   . RLS (restless legs syndrome)   . A-fib   . Osteopenia   . Mitral valve disease     s/p MVR 03/2011 w/ 27 mm porcine valve  . Hypothyroidism   . COPD (chronic obstructive pulmonary disease)   . CHF (congestive heart failure)   . Aortic stenosis     (s/p AVR w/ 21 mm porcine valve 03/2011  . Mastoiditis   . Myocardial infarction     "slight"  . Dysrhythmia   . Shortness of breath     with exertion  . Pre-diabetes   . GERD (gastroesophageal reflux disease)     takes nexium prn  . Arthritis   . History of blood transfusion     Rheumatic Fever  . Lung cancer     Past Surgical History  Procedure Laterality Date  . Mitral valve replacement      1990's  . Laparoscopic cholecystectomy    . Aortic valve replacement    . Tonsillectomy    .  Coronary artery bypass graft  03/2011    1990's also  . Video bronchoscopy with endobronchial navigation N/A 01/06/2014    Procedure: VIDEO BRONCHOSCOPY WITH ENDOBRONCHIAL NAVIGATION;  Surgeon: [name], MD;  Location: [facility];  Service: Thoracic;  Laterality: N/A;  . Video bronchoscopy with endobronchial ultrasound N/A 01/06/2014    Procedure: VIDEO BRONCHOSCOPY WITH ENDOBRONCHIAL ULTRASOUND;  Surgeon: [name], MD;  Location: [facility];  Service: Thoracic;  Laterality: N/A;    Current Outpatient Prescriptions    Medication Sig Dispense Refill  . aspirin EC 81 MG tablet Take 1 tablet (81 mg total) by mouth daily.    [name] atorvastatin (LIPITOR) 40 MG tablet Take 40 mg by mouth daily.     . budesonide-formoterol (SYMBICORT) 160-4.5 MCG/ACT inhaler Inhale 2 puffs into the lungs 2 (two) times daily.    . Calcium-Magnesium-Vitamin D (CALCIUM 500 PO) Take 1 tablet by mouth 2 (two) times daily.    . carvedilol (COREG) 6.25 MG tablet Take 1 tablet (6.25 mg total) by mouth 2 (two) times daily with a meal. 180 tablet 1  . digoxin (LANOXIN) 0.125 MG tablet Take 0.125 mg by mouth daily.     [name] esomeprazole (NEXIUM) 40 MG capsule Take 40 mg by mouth daily as needed (for acid reflux).     . fluticasone-salmeterol (ADVAIR HFA) 115-21 MCG/ACT inhaler Inhale 1 puff into the lungs 2 (two) times daily.    . furosemide (LASIX) 40 MG tablet Take 20-40 mg by mouth 2 (two) times daily. Take 40 mg by mouth in the morning and take 20 mg by mouth in the evening.    . Ipratropium-Albuterol (COMBIVENT) 20-100 MCG/ACT AERS respimat Inhale 1 puff into the lungs every 6 (six) hours as needed for wheezing or shortness of breath.     [name] ipratropium-albuterol (DUONEB) 0.5-2.5 (3) MG/3ML SOLN Take 3 mLs by nebulization 2 (two) times daily as needed (for shortness of breath).     [name] levothyroxine (SYNTHROID, LEVOTHROID) 100 MCG tablet Take 100 mcg by mouth daily before breakfast.     . potassium chloride SA (K-DUR,KLOR-CON) 20 MEQ tablet Take 20 mEq by mouth daily.     [name] tiotropium (SPIRIVA) 18 MCG inhalation capsule Place 18 mcg into inhaler and inhale daily.    . vitamin C (ASCORBIC ACID) 500 MG tablet Take 500 mg by mouth daily.     No current facility-administered medications for this visit.    Allergies:    Allergies  Allergen Reactions  . Coumadin [Warfarin Sodium] Other (See Comments)    Bleeding from ears  . Prozac [Fluoxetine Hcl] Rash    Social History:  The patient  reports that she has quit smoking. Her smoking use included  Cigarettes. She has a 15 pack-year smoking history. She has never used smokeless tobacco. She reports that she does not drink alcohol or use illicit drugs. her son died in 10/27/2011.  ROS:  Please see the history of present illness.   Denies any bleeding, syncope, orthopnea, PND  PHYSICAL EXAM: VS:  BP 159/59 mmHg  Pulse 70  Ht '5\' 1"'$  (1.549 m)  Wt 152 lb (68.947 kg)  BMI 28.74 kg/m2 Well nourished, well developed, in no acute distress HEENT: normal Neck: no JVD Cardiac:  Irregularly irregular; soft systolic murmurPrior surgical scar noted Lungs:  clear to auscultation bilaterally, no wheezing, rhonchi or rales Abd: soft, nontender, no hepatomegaly Ext: no edemaMild erythema on medial aspect of ankle right leg.  Skin: warm and dry  Neuro: no focal abnormalities noted  EKG: 08/11/14-atrial fibrillation heart rate 70, rightward axis, nonspecific ST-T wave changes, LVH with repolarization abnormality-previously 02/25/13- Atrial fibrillation, heart rate 80, nonspecific ST changes   09/09/13-atrial fibrillation, 77, borderline right bundle branch block.   Echocardiogram: 8/15 - Echo (8/15 - done at [facility]): EF 55-60%, mod LVH, restrictive pattern, mod LAE, mild RAE, MVR ok with mild MS (mean 5 mmHg), AVR ok with mild AS (mean 16 mmHg), mod elevated PASP, mild TR   ASSESSMENT AND PLAN:  1. Stage IA lung mass-had radiation therapy. Consultation note reviewed. 2. Coronary artery disease-status post bypass. No active anginal symptoms. Doing well. Prior bypass anatomy reviewed. 3. Chronic diastolic heart failure-encouraged her to utilize her Lasix. 4. Atrial fibrillation- previous lengthy discussions about anticoagulation. She does not wish to restart Coumadin because of previous inner ear bleeding, mastoid. She adamantly does not wish to be placed back on anticoagulation. She understands the risk of stroke. Dilated left atrium noted. Previous history of rheumatic heart disease. She was placed on  Eliquis however this was discontinued presumably for the biopsy. I asked her about restarting and she did not wish to do so. We will revisit after she is done with her radiation treatments. Anticoagulation could potentially precipitate bleeding. 5. Aortic valve replacement-previous aortic stenosis. Echocardiogram reviewed. August 2015. 6. Mitral valve replacement as well. 7. COPD-she states that she quit smoking in the 80s. She wishes that there were a therapy to fix the emphysema. 8. Peripheral neuropathy- Dr. Marisue Humble. She has excellent dorsalis pedis pulse. She was to discuss restless leg syndrome with him. 9. 6 month f/u  Signed, Candee Furbish, MD [facility]  08/11/2014 10:21 AM

## 2014-08-11 NOTE — Patient Instructions (Signed)
Medication Instructions:  Your physician recommends that you continue on your current medications as directed. Please refer to the Current Medication list given to you today.  Follow-Up: Follow up in 6 months with Dr. Marlou Porch.  You will receive a letter in the mail 2 months before you are due.  Please call us when you receive this letter to schedule your follow up appointment.  Thank you for choosing [facility]!!

## 2014-08-18 ENCOUNTER — Emergency Department: Payer: Medicare Other

## 2014-08-18 ENCOUNTER — Emergency Department
Admission: EM | Admit: 2014-08-18 | Discharge: 2014-08-18 | Disposition: A | Discharge status: Home / Self Care | Payer: Medicare Other | Attending: Emergency Medicine | Admitting: Emergency Medicine

## 2014-08-18 ENCOUNTER — Encounter: Payer: Self-pay | Admitting: *Deleted

## 2014-08-18 DIAGNOSIS — J441 Chronic obstructive pulmonary disease with (acute) exacerbation: Secondary | ICD-10-CM | POA: Diagnosis not present

## 2014-08-18 DIAGNOSIS — Z7982 Long term (current) use of aspirin: Secondary | ICD-10-CM | POA: Insufficient documentation

## 2014-08-18 DIAGNOSIS — R05 Cough: Secondary | ICD-10-CM | POA: Diagnosis not present

## 2014-08-18 DIAGNOSIS — Z79899 Other long term (current) drug therapy: Secondary | ICD-10-CM | POA: Diagnosis not present

## 2014-08-18 DIAGNOSIS — I10 Essential (primary) hypertension: Secondary | ICD-10-CM | POA: Diagnosis not present

## 2014-08-18 DIAGNOSIS — Z7951 Long term (current) use of inhaled steroids: Secondary | ICD-10-CM | POA: Insufficient documentation

## 2014-08-18 DIAGNOSIS — R0602 Shortness of breath: Secondary | ICD-10-CM | POA: Diagnosis present

## 2014-08-18 DIAGNOSIS — Z87891 Personal history of nicotine dependence: Secondary | ICD-10-CM | POA: Diagnosis not present

## 2014-08-18 DIAGNOSIS — J449 Chronic obstructive pulmonary disease, unspecified: Secondary | ICD-10-CM | POA: Diagnosis not present

## 2014-08-18 LAB — CBC WITH DIFFERENTIAL/PLATELET
BASOS PCT: 1 %
Basophils Absolute: 0.1 10*3/uL (ref 0–0.1)
EOS ABS: 0.3 10*3/uL (ref 0–0.7)
EOS PCT: 2 %
HCT: 40 % (ref 35.0–47.0)
HEMOGLOBIN: 13.6 g/dL (ref 12.0–16.0)
Lymphocytes Relative: 17 %
Lymphs Abs: 1.9 10*3/uL (ref 1.0–3.6)
MCH: 29.9 pg (ref 26.0–34.0)
MCHC: 34.1 g/dL (ref 32.0–36.0)
MCV: 87.8 fL (ref 80.0–100.0)
Monocytes Absolute: 1 10*3/uL — ABNORMAL HIGH (ref 0.2–0.9)
Monocytes Relative: 9 %
NEUTROS ABS: 7.6 10*3/uL — AB (ref 1.4–6.5)
NEUTROS PCT: 71 %
Platelets: 186 10*3/uL (ref 150–440)
RBC: 4.55 MIL/uL (ref 3.80–5.20)
RDW: 14.5 % (ref 11.5–14.5)
WBC: 10.8 10*3/uL (ref 3.6–11.0)

## 2014-08-18 LAB — COMPREHENSIVE METABOLIC PANEL
ALBUMIN: 3.9 g/dL (ref 3.5–5.0)
ALT: 23 U/L (ref 14–54)
AST: 28 U/L (ref 15–41)
Alkaline Phosphatase: 87 U/L (ref 38–126)
Anion gap: 10 (ref 5–15)
BILIRUBIN TOTAL: 0.8 mg/dL (ref 0.3–1.2)
BUN: 23 mg/dL — AB (ref 6–20)
CO2: 27 mmol/L (ref 22–32)
Calcium: 9 mg/dL (ref 8.9–10.3)
Chloride: 102 mmol/L (ref 101–111)
Creatinine, Ser: 0.93 mg/dL (ref 0.44–1.00)
GFR calc Af Amer: >60 mL/min (ref >60)
GFR, EST NON AFRICAN AMERICAN: 57 mL/min — AB (ref >60)
Glucose, Bld: 143 mg/dL — ABNORMAL HIGH (ref 65–99)
POTASSIUM: 3.2 mmol/L — AB (ref 3.5–5.1)
SODIUM: 139 mmol/L (ref 135–145)
TOTAL PROTEIN: 7 g/dL (ref 6.5–8.1)

## 2014-08-18 LAB — TROPONIN I: TROPONIN I: 0.03 ng/mL (ref <0.031)

## 2014-08-18 MED ORDER — ALBUTEROL SULFATE (2.5 MG/3ML) 0.083% IN NEBU
5.0000 mg | INHALATION_SOLUTION | Freq: Once | RESPIRATORY_TRACT | Status: AC
  Administered 2014-08-18: 2.5 mg via RESPIRATORY_TRACT

## 2014-08-18 MED ORDER — ALBUTEROL SULFATE (2.5 MG/3ML) 0.083% IN NEBU
INHALATION_SOLUTION | RESPIRATORY_TRACT | Status: AC
  Administered 2014-08-18: 2.5 mg via RESPIRATORY_TRACT
  Filled 2014-08-18: qty 3

## 2014-08-18 NOTE — ED Notes (Signed)
Pt has chronic emphysema. Pt experiencing worsening dyspnea tonight per her report. Pt has non-productive cough and presents w/ labored breathing.

## 2014-08-18 NOTE — Discharge Instructions (Signed)
Please seek medical attention for any high fevers, chest pain, shortness of breath, change in behavior, persistent vomiting, bloody stool or any other new or concerning symptoms.  Shortness of Breath Shortness of breath means you have trouble breathing. It could also mean that you have a medical problem. You should get immediate medical care for shortness of breath. CAUSES   Not enough oxygen in the air such as with high altitudes or a smoke-filled room.  Certain lung diseases, infections, or problems.  Heart disease or conditions, such as angina or heart failure.  Low red blood cells (anemia).  Poor physical fitness, which can cause shortness of breath when you exercise.  Chest or back injuries or stiffness.  Being overweight.  Smoking.  Anxiety, which can make you feel like you are not getting enough air. DIAGNOSIS  Serious medical problems can often be found during your physical exam. Tests may also be done to determine why you are having shortness of breath. Tests may include:  Chest X-rays.  Lung function tests.  Blood tests.  An electrocardiogram (ECG).  An ambulatory electrocardiogram. An ambulatory ECG records your heartbeat patterns over a 24-hour period.  Exercise testing.  A transthoracic echocardiogram (TTE). During echocardiography, sound waves are used to evaluate how blood flows through your heart.  A transesophageal echocardiogram (TEE).  Imaging scans. Your health care provider may not be able to find a cause for your shortness of breath after your exam. In this case, it is important to have a follow-up exam with your health care provider as directed.  TREATMENT  Treatment for shortness of breath depends on the cause of your symptoms and can vary greatly. HOME CARE INSTRUCTIONS   Do not smoke. Smoking is a common cause of shortness of breath. If you smoke, ask for help to quit.  Avoid being around chemicals or things that may bother your breathing,  such as paint fumes and dust.  Rest as needed. Slowly resume your usual activities.  If medicines were prescribed, take them as directed for the full length of time directed. This includes oxygen and any inhaled medicines.  Keep all follow-up appointments as directed by your health care provider. SEEK MEDICAL CARE IF:   Your condition does not improve in the time expected.  You have a hard time doing your normal activities even with rest.  You have any new symptoms. SEEK IMMEDIATE MEDICAL CARE IF:   Your shortness of breath gets worse.  You feel light-headed, faint, or develop a cough not controlled with medicines.  You start coughing up blood.  You have pain with breathing.  You have chest pain or pain in your arms, shoulders, or abdomen.  You have a fever.  You are unable to walk up stairs or exercise the way you normally do. MAKE SURE YOU:  Understand these instructions.  Will watch your condition.  Will get help right away if you are not doing well or get worse. Document Released: 11/30/2000 Document Revised: 03/12/2013 Document Reviewed: 05/23/2011 [facility] Patient Information 2015 [place], [place]. This information is not intended to replace advice given to you by your health care provider. Make sure you discuss any questions you have with your health care provider.

## 2014-08-18 NOTE — ED Provider Notes (Signed)
[facility] Emergency Department Provider Note    ____________________________________________  Time seen: 1000  I have reviewed the triage vital signs and the nursing notes.   HISTORY  Chief Complaint Shortness of Breath   History limited by: Not Limited   HPI Marissa [name] is a 81 y.o. female presents to the emergency department today because of dyspnea. Patient states that the symptoms started last night. She states that she had a nonproductive cough with this. It is worse with exertion. She did not have any associated chest pain or shortness of breath. Denies any recent fevers. Patient does have a history of emphysema. States that she has oxygen at home which she uses during the night and should use during the day but typically does not.    Past Medical History  Diagnosis Date  . HTN (hypertension)   . Hypercholesteremia   . CAD (coronary artery disease)     s/p CABG 03/2011  . Aortic valve disorders   . RLS (restless legs syndrome)   . A-fib   . Osteopenia   . Mitral valve disease     s/p MVR 03/2011 w/ 27 mm porcine valve  . Hypothyroidism   . COPD (chronic obstructive pulmonary disease)   . CHF (congestive heart failure)   . Aortic stenosis     (s/p AVR w/ 21 mm porcine valve 03/2011  . Mastoiditis   . Myocardial infarction     "slight"  . Dysrhythmia   . Shortness of breath     with exertion  . Pre-diabetes   . GERD (gastroesophageal reflux disease)     takes nexium prn  . Arthritis   . History of blood transfusion     Rheumatic Fever  . Lung cancer     Patient Active Problem List   Diagnosis Date Noted  . Primary cancer of right upper lobe of lung 12/19/2013  . HTN (hypertension)   . Hypercholesteremia   . CAD (coronary artery disease)   . Aortic valve disorders   . RLS (restless legs syndrome)   . A-fib   . Osteopenia   . Mitral valve disease   . Hypothyroidism   . COPD (chronic obstructive pulmonary disease)   .  CHF (congestive heart failure)   . Aortic stenosis   . Mastoiditis     Past Surgical History  Procedure Laterality Date  . Mitral valve replacement      1990's  . Laparoscopic cholecystectomy    . Aortic valve replacement    . Tonsillectomy    . Coronary artery bypass graft  03/2011    1990's also  . Video bronchoscopy with endobronchial navigation N/A 01/06/2014    Procedure: VIDEO BRONCHOSCOPY WITH ENDOBRONCHIAL NAVIGATION;  Surgeon: [name], MD;  Location: [facility];  Service: Thoracic;  Laterality: N/A;  . Video bronchoscopy with endobronchial ultrasound N/A 01/06/2014    Procedure: VIDEO BRONCHOSCOPY WITH ENDOBRONCHIAL ULTRASOUND;  Surgeon: [name], MD;  Location: [facility];  Service: Thoracic;  Laterality: N/A;    Current Outpatient Rx  Name  Route  Sig  Dispense  Refill  . aspirin EC 81 MG tablet   Oral   Take 1 tablet (81 mg total) by mouth daily.         [name] atorvastatin (LIPITOR) 40 MG tablet   Oral   Take 40 mg by mouth daily.          [name] CALCIUM CARBONATE PO   Oral   Take 1  tablet by mouth 2 (two) times daily.         . carvedilol (COREG) 6.25 MG tablet   Oral   Take 1 tablet (6.25 mg total) by mouth 2 (two) times daily with a meal.   180 tablet   1   . digoxin (LANOXIN) 0.125 MG tablet   Oral   Take 0.125 mg by mouth daily.          [name] esomeprazole (NEXIUM) 40 MG capsule   Oral   Take 40 mg by mouth daily as needed (for acid reflux).          . fluticasone-salmeterol (ADVAIR HFA) 115-21 MCG/ACT inhaler   Inhalation   Inhale 1 puff into the lungs 2 (two) times daily.         . furosemide (LASIX) 40 MG tablet   Oral   Take 20-40 mg by mouth 2 (two) times daily. Take 40 mg by mouth in the morning and take 20 mg by mouth in the evening for fluid.         . Ipratropium-Albuterol (COMBIVENT) 20-100 MCG/ACT AERS respimat   Inhalation   Inhale 1 puff into the lungs every 6 (six) hours as needed for wheezing or shortness of  breath.          . levothyroxine (SYNTHROID, LEVOTHROID) 100 MCG tablet   Oral   Take 100 mcg by mouth daily before breakfast.          . potassium chloride SA (K-DUR,KLOR-CON) 20 MEQ tablet   Oral   Take 20 mEq by mouth daily.          [name] rOPINIRole (REQUIP) 4 MG tablet   Oral   Take 4 mg by mouth at bedtime.         . vitamin C (ASCORBIC ACID) 500 MG tablet   Oral   Take 500 mg by mouth daily.         . budesonide-formoterol (SYMBICORT) 160-4.5 MCG/ACT inhaler   Inhalation   Inhale 2 puffs into the lungs 2 (two) times daily.         [name] ipratropium-albuterol (DUONEB) 0.5-2.5 (3) MG/3ML SOLN   Nebulization   Take 3 mLs by nebulization every 6 (six) hours as needed (for shortness of breath).          . tiotropium (SPIRIVA) 18 MCG inhalation capsule   Inhalation   Place 18 mcg into inhaler and inhale daily.           Allergies Coumadin and Prozac  Family History  Problem Relation Age of Onset  . Adopted: Yes  . Cancer Brother     lung  . Heart attack Neg Hx   . Stroke Neg Hx     Social History History  Substance Use Topics  . Smoking status: Former Smoker -- 0.50 packs/day for 30 years    Types: Cigarettes  . Smokeless tobacco: Never Used  . Alcohol Use: No    Review of Systems  Constitutional: Negative for fever. Cardiovascular: Negative for chest pain. Respiratory: Positive for shortness of breath. Gastrointestinal: Negative for abdominal pain, vomiting and diarrhea. Genitourinary: Negative for dysuria. Musculoskeletal: Negative for back pain. Skin: Negative for rash. Neurological: Negative for headaches, focal weakness or numbness.  10-point ROS otherwise negative.  ____________________________________________   PHYSICAL EXAM:  VITAL SIGNS: ED Triage Vitals  Enc Vitals Group     BP 08/18/14 0435 135/63 mmHg     Pulse Rate 08/18/14 0435 67  Resp 08/18/14 0435 24     Temp 08/18/14 0435 98.3 F (36.8 C)     Temp Source  08/18/14 0435 Oral     SpO2 08/18/14 0435 93 %     Weight 08/18/14 0435 148 lb (67.132 kg)     Height 08/18/14 0435 '5\' 1"'$  (1.549 m)   Constitutional: Alert and oriented. Well appearing and in no distress. Eyes: Conjunctivae are normal. PERRL. Normal extraocular movements. ENT   Head: Normocephalic and atraumatic.   Nose: No congestion/rhinnorhea.   Mouth/Throat: Mucous membranes are moist.   Neck: No stridor. Hematological/Lymphatic/Immunilogical: No cervical lymphadenopathy. Cardiovascular: Normal rate, irregularly irregular.  No murmurs, rubs, or gallops. Respiratory: Normal respiratory effort without tachypnea nor retractions. Breath sounds are clear and equal bilaterally. No wheezes/rales/rhonchi. Gastrointestinal: Soft and nontender. No distention.  Genitourinary: Deferred Musculoskeletal: Normal range of motion in all extremities. No joint effusions.  No lower extremity tenderness nor edema. Neurologic:  Normal speech and language. No gross focal neurologic deficits are appreciated. Speech is normal.  Skin:  Skin is warm, dry and intact. No rash noted. Psychiatric: Mood and affect are normal. Speech and behavior are normal. Patient exhibits appropriate insight and judgment.  ____________________________________________    LABS (pertinent positives/negatives)  Labs Reviewed  COMPREHENSIVE METABOLIC PANEL - Abnormal; Notable for the following:    Potassium 3.2 (*)    Glucose, Bld 143 (*)    BUN 23 (*)    GFR calc non Af Amer 57 (*)    All other components within normal limits  CBC WITH DIFFERENTIAL/PLATELET - Abnormal; Notable for the following:    Neutro Abs 7.6 (*)    Monocytes Absolute 1.0 (*)    All other components within normal limits  TROPONIN I     ____________________________________________   EKG  I, Nance Pear, attending physician, personally viewed and interpreted this EKG  EKG Time: 0448 Rate: 68 Rhythm: afib Axis:  normal Intervals: qtc 538 QRS: q waves v1, v2 ST changes: no st elevation    ____________________________________________    RADIOLOGY  CXR 2 view  IMPRESSION: Increasing conspicuity of RIGHT upper lobe nodular density at site of known neoplasm concerning for disease progression which would be better characterized on CT chest with contrast on a nonemergent basis.  Stable cardiomegaly and COPD.  ____________________________________________   PROCEDURES  Procedure(s) performed: None  Critical Care performed: No  ____________________________________________   INITIAL IMPRESSION / ASSESSMENT AND PLAN / ED COURSE  Pertinent labs & imaging results that were available during my care of the patient were reviewed by me and considered in my medical decision making (see chart for details).  Patient here because of shortness of breath. Has a history of emphysema. Denies any chest pain or fevers recently. The time of my examination paced stated she felt better and close to baseline. She did receive 1 breathing treatment prior to my evaluation. This point patient stated she wanted to go home. I did discuss with patient and family are x-ray finding of a slightly increased size of the lung nodule. They state they do have follow-up appointment for that nodule next month. Discussed return precautions. Will discharge home.  ____________________________________________   FINAL CLINICAL IMPRESSION(S) / ED DIAGNOSES  Final diagnoses:  Shortness of breath     Nance Pear, MD 08/18/14 1129

## 2014-08-18 NOTE — ED Notes (Signed)
Pt alert and oriented X4, active, cooperative, pt in NAD. RR even and unlabored, color WNL.  Pt family informed to return with patient if any life threatening symptoms occur.  

## 2014-08-18 NOTE — ED Notes (Signed)
MD at bedside. 

## 2014-09-08 DIAGNOSIS — E119 Type 2 diabetes mellitus without complications: Secondary | ICD-10-CM | POA: Diagnosis not present

## 2014-09-27 DIAGNOSIS — R609 Edema, unspecified: Secondary | ICD-10-CM | POA: Diagnosis not present

## 2014-09-30 NOTE — Patient Outreach (Signed)
[facility]) Care Management  09/30/2014  Marissa [name] 1934/03/09 [id number]   Referral from Alvan List, [name], RN assigned to outreach.  [name]. [place], [place] Management [place] Assistant Phone: [phone] Fax: [phone]

## 2014-10-13 ENCOUNTER — Telehealth: Payer: Self-pay | Admitting: *Deleted

## 2014-10-13 ENCOUNTER — Ambulatory Visit (HOSPITAL_COMMUNITY)
Admission: RE | Admit: 2014-10-13 | Discharge: 2014-10-13 | Disposition: A | Discharge status: Home / Self Care | Payer: Medicare Other | Source: Ambulatory Visit | Attending: Radiation Oncology | Admitting: Radiation Oncology

## 2014-10-13 DIAGNOSIS — Z9221 Personal history of antineoplastic chemotherapy: Secondary | ICD-10-CM | POA: Insufficient documentation

## 2014-10-13 DIAGNOSIS — C3491 Malignant neoplasm of unspecified part of right bronchus or lung: Secondary | ICD-10-CM | POA: Diagnosis not present

## 2014-10-13 DIAGNOSIS — R590 Localized enlarged lymph nodes: Secondary | ICD-10-CM | POA: Insufficient documentation

## 2014-10-13 DIAGNOSIS — C3411 Malignant neoplasm of upper lobe, right bronchus or lung: Secondary | ICD-10-CM | POA: Insufficient documentation

## 2014-10-13 DIAGNOSIS — Z09 Encounter for follow-up examination after completed treatment for conditions other than malignant neoplasm: Secondary | ICD-10-CM | POA: Diagnosis not present

## 2014-10-13 DIAGNOSIS — J439 Emphysema, unspecified: Secondary | ICD-10-CM | POA: Diagnosis not present

## 2014-10-13 NOTE — Telephone Encounter (Signed)
CALLED RADIATION ONCOLOGY AND SPOKE TO DR.MANNING. HE IS AWARE OF THE PT.'S CT CHEST RESULTS.

## 2014-10-20 DIAGNOSIS — H9191 Unspecified hearing loss, right ear: Secondary | ICD-10-CM | POA: Diagnosis not present

## 2014-10-20 DIAGNOSIS — Z9889 Other specified postprocedural states: Secondary | ICD-10-CM | POA: Diagnosis not present

## 2014-10-23 ENCOUNTER — Other Ambulatory Visit: Payer: Self-pay

## 2014-10-23 NOTE — Patient Outreach (Signed)
[facility]) Care Management  10/23/2014  Marissa [name] 18-May-1933 [id number]     RN CM was unable to call patient at the number listed.  The home number listed had a comment of " Do not call this number" and no other contact number was listed.   RN CM called the primary physician's off for another number patient could be reached at.  Office does not have a number listed for this patient.  Stated all the numbers listed have been removed for their data base.  RN CM will send a letter to patient's home about the services of [place].  [place] office aware of letter being sent to patient's home address.  [name], RN, Ishmael Holter, [place] Telephonic Care Coordinator [phone]

## 2014-10-27 ENCOUNTER — Ambulatory Visit
Admission: RE | Admit: 2014-10-27 | Discharge: 2014-10-27 | Disposition: A | Discharge status: Home / Self Care | Payer: Medicare Other | Source: Ambulatory Visit | Attending: Radiation Oncology | Admitting: Radiation Oncology

## 2014-10-27 ENCOUNTER — Encounter: Payer: Self-pay | Admitting: Radiation Oncology

## 2014-10-27 VITALS — BP 148/57 | HR 64 | Resp 16 | Wt 145.8 lb

## 2014-10-27 DIAGNOSIS — C3411 Malignant neoplasm of upper lobe, right bronchus or lung: Secondary | ICD-10-CM | POA: Diagnosis not present

## 2014-10-27 NOTE — Progress Notes (Signed)
Weight and vitals stable. Denies pain. Reports frequency of productive cough has increased. Reports yellow sputum with cough. Denies hemoptysis. Denies sore throat or difficulty swallowing. Reports SOB. Reports decreased energy level. Denies headache, dizziness, nausea, vomiting, diplopia or ringing in the ears.   BP 148/57 mmHg  Pulse 64  Resp 16  Wt 145 lb 12.8 oz (66.134 kg) Wt Readings from Last 3 Encounters:  10/27/14 145 lb 12.8 oz (66.134 kg)  08/18/14 148 lb (67.132 kg)  08/11/14 152 lb (68.947 kg)

## 2014-10-27 NOTE — Progress Notes (Signed)
Radiation Oncology         [phone] ________________________________  Name: Marissa [name] MRN: [id number]  Date: 10/27/2014  DOB: [birth year]     Follow-Up Visit Note  CC: [name], MD [name]  Diagnosis:81 y.o. woman with stage T1a N0 M0 presumed non-small cell lung cancer - Stage IA s/p Curative, Definitive SBRT11/23/2015, 02/12/2014, 11/30/2015treated to 54 Gy in 3 fractions     ICD-9-CM ICD-10-CM   1. Primary cancer of right upper lobe of lung 162.3 C34.11 NM PET Image Restag (PS) Skull Base To Thigh    Interval Since Last Radiation:  9 months  Narrative:  The patient returns today for routine follow-up.  Weight and vitals stable. Denies pain. Reports frequency of productive cough has increased. Reports yellow sputum with cough. Denies hemoptysis. Denies sore throat or difficulty swallowing. Reports SOB. Reports decreased energy level. Denies headache, dizziness, nausea, vomiting, diplopia or ringing in the ears.   ALLERGIES:  is allergic to coumadin and prozac.  Meds: Current Outpatient Prescriptions  Medication Sig Dispense Refill  . aspirin EC 81 MG tablet Take 1 tablet (81 mg total) by mouth daily.    [name] atorvastatin (LIPITOR) 40 MG tablet Take 40 mg by mouth daily.     . budesonide-formoterol (SYMBICORT) 160-4.5 MCG/ACT inhaler Inhale 2 puffs into the lungs 2 (two) times daily.    [name] CALCIUM CARBONATE PO Take 1 tablet by mouth 2 (two) times daily.    . carvedilol (COREG) 6.25 MG tablet Take 1 tablet (6.25 mg total) by mouth 2 (two) times daily with a meal. 180 tablet 1  . digoxin (LANOXIN) 0.125 MG tablet Take 0.125 mg by mouth daily.     [name] esomeprazole (NEXIUM) 40 MG capsule Take 40 mg by mouth daily as needed (for acid reflux).     . fluticasone-salmeterol (ADVAIR HFA) 115-21 MCG/ACT inhaler Inhale 1 puff into the lungs 2 (two) times daily.    . furosemide (LASIX) 40 MG tablet Take 20-40 mg by mouth 2 (two) times daily. Take 40 mg by  mouth in the morning and take 20 mg by mouth in the evening for fluid.    [name] glimepiride (AMARYL) 1 MG tablet Take 1 mg by mouth daily with breakfast.    . Ipratropium-Albuterol (COMBIVENT) 20-100 MCG/ACT AERS respimat Inhale 1 puff into the lungs every 6 (six) hours as needed for wheezing or shortness of breath.     [name] ipratropium-albuterol (DUONEB) 0.5-2.5 (3) MG/3ML SOLN Take 3 mLs by nebulization every 6 (six) hours as needed (for shortness of breath).     [name] levothyroxine (SYNTHROID, LEVOTHROID) 100 MCG tablet Take 100 mcg by mouth daily before breakfast.     . potassium chloride SA (K-DUR,KLOR-CON) 20 MEQ tablet Take 20 mEq by mouth daily.     [name] rOPINIRole (REQUIP) 4 MG tablet Take 4 mg by mouth at bedtime.    [name] tiotropium (SPIRIVA) 18 MCG inhalation capsule Place 18 mcg into inhaler and inhale daily.    . vitamin C (ASCORBIC ACID) 500 MG tablet Take 500 mg by mouth daily.     No current facility-administered medications for this encounter.    Physical Findings: The patient is in no acute distress. Patient is alert and oriented.  weight is 145 lb 12.8 oz (66.134 kg). Her blood pressure is 148/57 and her pulse is 64. Her respiration is 16. .  No significant changes.  Lab Findings: Lab Results  Component Value Date   WBC 10.8 08/18/2014  HGB 13.6 08/18/2014   HCT 40.0 08/18/2014   MCV 87.8 08/18/2014   PLT 186 08/18/2014    '@LASTCHEM'$ @  Radiographic Findings: Ct Chest Wo Contrast  10/13/2014   CLINICAL DATA:  Restaging right lung cancer diagnosed 2015. Radiation therapy complete. Emphysema.  EXAM: CT CHEST WITHOUT CONTRAST  TECHNIQUE: Multidetector CT imaging of the chest was performed following the standard protocol without IV contrast.  COMPARISON:  CT 04/14/2014, PET-CT 9292 and 15  FINDINGS: Mediastinum/Nodes: No axillary or supraclavicular lymphadenopathy.  Mediastinal lymph node adjacent to the distal SVC measures 11 mm on image 30, series 2 increased from 9 mm. Right lower  paratracheal lymph node measures 15 mm increased from 12 mm.  Lungs/Pleura:  New nodular consolidation within the right upper lobe. One discrete nodule measures 18 mm x 12 mm on image 18, series 7 and was not present on comparison exam. There is a less well-defined a consolidative process measuring up to 7 cm more superiorly and right upper lobe with peripheral air bronchograms.  Focus of atelectasis at the left lung base not changed prior.  Pleural nodule over the right hemidiaphragm is unchanged on image 43, series 7.  Upper abdomen: Limited view of the liver, kidneys, pancreas are unremarkable. Normal adrenal glands.  Musculoskeletal: No aggressive osseous lesion.  IMPRESSION: 1. New consolidation in the right upper lobe with measurable nodularity is a concerning for lung cancer recurrence. Pulmonary infection would be a secondary differential. Recommend clinical correlation and consider bronchoscopy. 2. Interval mild increase in mediastinal lymphadenopathy is also concerning for disease recurrence favored over reactive adenopathy. These results will be called to the ordering clinician or representative by the Radiologist Assistant, and communication documented in the PACS or zVision Dashboard.   Electronically Signed   By: Suzy Bouchard M.D.   On: 10/13/2014 12:10    Impression:  The patient is recovering from the effects of radiation. CT of the chest revealed a new nodule concerning for lung cancer recurrence vs scar tissue and also mediastinal lymph nodes which are suspicous. I advise PET scan.  PET may show hypermetabolism in right lung whether recurrence or scarring.  However, it should help to evaluate the lymph nodes and extrathoracic organs.   Plan: Repeat PET scan then follow-up.   _____________________________________  Sheral Apley. Tammi Klippel, M.D.   This document serves as a record of services personally performed by Tyler Pita, MD. It was created on his behalf by Derek Mound, a trained  medical scribe. The creation of this record is based on the scribe's personal observations and the provider's statements to them. This document has been checked and approved by the attending provider.

## 2014-10-28 ENCOUNTER — Telehealth: Payer: Self-pay | Admitting: *Deleted

## 2014-10-28 NOTE — Telephone Encounter (Signed)
Arranged Pet Scan for 11-05-14 - arrival time - 12:30 pm @ [facility] and her fu visit with Dr. Tammi Klippel on 11/06/14 @ 11 am to get her results, e-mailed appts. Per Dr. Tammi Klippel request.

## 2014-11-05 ENCOUNTER — Ambulatory Visit (HOSPITAL_COMMUNITY): Payer: Medicare Other

## 2014-11-06 ENCOUNTER — Ambulatory Visit: Payer: Medicare Other | Admitting: Radiation Oncology

## 2014-11-10 ENCOUNTER — Encounter (HOSPITAL_COMMUNITY)
Admission: RE | Admit: 2014-11-10 | Discharge: 2014-11-10 | Disposition: A | Discharge status: Home / Self Care | Payer: Medicare Other | Source: Ambulatory Visit | Attending: Radiation Oncology | Admitting: Radiation Oncology

## 2014-11-10 DIAGNOSIS — C3411 Malignant neoplasm of upper lobe, right bronchus or lung: Secondary | ICD-10-CM | POA: Diagnosis not present

## 2014-11-10 LAB — GLUCOSE, CAPILLARY: Glucose-Capillary: 183 mg/dL — ABNORMAL HIGH (ref 65–99)

## 2014-11-10 MED ORDER — FLUDEOXYGLUCOSE F - 18 (FDG) INJECTION
7.5000 | Freq: Once | INTRAVENOUS | Status: DC | PRN
  Administered 2014-11-10: 7.5 via INTRAVENOUS
  Filled 2014-11-10: qty 7.5

## 2014-11-13 ENCOUNTER — Telehealth: Payer: Self-pay | Admitting: *Deleted

## 2014-11-13 ENCOUNTER — Encounter: Payer: Self-pay | Admitting: *Deleted

## 2014-11-13 NOTE — Telephone Encounter (Signed)
Unable to call at this time, will call tomorrow to set up for Lee

## 2014-11-13 NOTE — Progress Notes (Signed)
Oncology Nurse Navigator Documentation  Oncology Nurse Navigator Flowsheets 11/13/2014  Navigator Encounter Type Other/Patient was discussed at cancer conference today.  The treatment recommendations was for a bronch by Dr. Roxan Hockey.  I contacted Ebony Hail at Dr. Leonarda Salon office to arrange appt.  She will get back with me on appt time  Interventions Coordination of Care  Time Spent with Patient 15

## 2014-11-17 ENCOUNTER — Ambulatory Visit: Payer: Medicare Other | Admitting: Radiation Oncology

## 2014-11-18 ENCOUNTER — Telehealth: Payer: Self-pay | Admitting: *Deleted

## 2014-11-18 ENCOUNTER — Encounter: Payer: Self-pay | Admitting: *Deleted

## 2014-11-18 NOTE — Telephone Encounter (Signed)
Oncology Nurse Navigator Documentation  Oncology Nurse Navigator Flowsheets 11/18/2014  Navigator Encounter Type Telephone/I called and spoke with daughter.  Patient will be seen tomorrow 11/19/14 arrive at [facility] at 2:30.  She verbalized understanding of appt time and place.   Interventions Coordination of Care  Coordination of Care MD Appointments  Time Spent with Patient -

## 2014-11-18 NOTE — Progress Notes (Signed)
Oncology Nurse Navigator Documentation  Oncology Nurse Navigator Flowsheets 11/18/2014  Navigator Encounter Type Other/I called [facility] and spoke with Thurmond Butts to arrange EBUS.  She stated she would arrange.    Interventions Coordination of Care  Time Spent with Patient 15

## 2014-11-19 ENCOUNTER — Other Ambulatory Visit: Payer: Self-pay | Admitting: *Deleted

## 2014-11-19 ENCOUNTER — Institutional Professional Consult (permissible substitution) (INDEPENDENT_AMBULATORY_CARE_PROVIDER_SITE_OTHER): Payer: Medicare Other | Admitting: Thoracic Surgery (Cardiothoracic Vascular Surgery)

## 2014-11-19 ENCOUNTER — Encounter: Payer: Self-pay | Admitting: Thoracic Surgery (Cardiothoracic Vascular Surgery)

## 2014-11-19 VITALS — BP 127/57 | HR 66 | Resp 20 | Ht 61.0 in | Wt 145.0 lb

## 2014-11-19 DIAGNOSIS — R911 Solitary pulmonary nodule: Secondary | ICD-10-CM | POA: Diagnosis not present

## 2014-11-19 DIAGNOSIS — R918 Other nonspecific abnormal finding of lung field: Secondary | ICD-10-CM

## 2014-11-19 DIAGNOSIS — I2510 Atherosclerotic heart disease of native coronary artery without angina pectoris: Secondary | ICD-10-CM

## 2014-11-19 DIAGNOSIS — R590 Localized enlarged lymph nodes: Secondary | ICD-10-CM

## 2014-11-19 NOTE — Progress Notes (Signed)
PCP is [name], MD Referring Provider is Molli Barrows, MD  Chief Complaint  Patient presents with  . Lung Lesion    Eval for possible EBUS, PET Scan 11/10/14, Chest CT 10/13/14     HPI: Marissa [name] is a 81 year old woman with a history of stage IA non-small cell carcinoma of the lung treated with SBRT last year. Her PET/CT prior to treatment showed the right upper lobe nodule was hypermetabolic with an SUV of 5.1. Marissa [name] had enlarged mediastinal lymph nodes on CT which were not hypermetabolic. Level 4R and 7 nodes were sampled with EBUS and were negative for tumor.  Marissa [name] recently had a follow-up scan which showed radiation changes in the right upper lobe, but with the suggestion of a mass lesion in the vicinity. In addition there was enlargement of the right paratracheal nodes. A PET CT was done which showed the right upper lobe mass and paratracheal lymph nodes were markedly hypermetabolic.  Marissa [name] tolerated her radiation treatments without any major complication. Marissa [name] says that since then Marissa [name]'s had a more frequent cough. Marissa [name] has not had any hemoptysis. Her cough is productive of clear to yellow sputum. Marissa [name] is short of breath due to her COPD and is using 2 L of nasal cannula oxygen. Marissa [name] has atrial fibrillation but does not take Coumadin due to bleeding complications. Marissa [name] has a history of coronary bypass grafting and mitral valve replacement porcine valve in 2013.   Past Medical History  Diagnosis Date  . HTN (hypertension)   . Hypercholesteremia   . CAD (coronary artery disease)     s/p CABG 03/2011  . Aortic valve disorders   . RLS (restless legs syndrome)   . A-fib   . Osteopenia   . Mitral valve disease     s/p MVR 03/2011 w/ 27 mm porcine valve  . Hypothyroidism   . COPD (chronic obstructive pulmonary disease)   . CHF (congestive heart failure)   . Aortic stenosis     (s/p AVR w/ 21 mm porcine valve 03/2011  . Mastoiditis   . Myocardial infarction     "slight"  . Dysrhythmia    . Shortness of breath     with exertion  . Pre-diabetes   . GERD (gastroesophageal reflux disease)     takes nexium prn  . Arthritis   . History of blood transfusion     Rheumatic Fever  . Lung cancer     Past Surgical History  Procedure Laterality Date  . Mitral valve replacement      1990's  . Laparoscopic cholecystectomy    . Aortic valve replacement    . Tonsillectomy    . Coronary artery bypass graft  03/2011    1990's also  . Video bronchoscopy with endobronchial navigation N/A 01/06/2014    Procedure: VIDEO BRONCHOSCOPY WITH ENDOBRONCHIAL NAVIGATION;  Surgeon: [name], MD;  Location: [facility];  Service: Thoracic;  Laterality: N/A;  . Video bronchoscopy with endobronchial ultrasound N/A 01/06/2014    Procedure: VIDEO BRONCHOSCOPY WITH ENDOBRONCHIAL ULTRASOUND;  Surgeon: [name], MD;  Location: [facility] OR;  Service: Thoracic;  Laterality: N/A;    Family History  Problem Relation Age of Onset  . Adopted: Yes  . Cancer Brother     lung  . Heart attack Neg Hx   . Stroke Neg Hx     Social History Social History  Substance Use Topics  . Smoking status: Former Smoker -- 0.50 packs/day for 30 years    Types:  Cigarettes  . Smokeless tobacco: Never Used  . Alcohol Use: No    Current Outpatient Prescriptions  Medication Sig Dispense Refill  . aspirin EC 81 MG tablet Take 1 tablet (81 mg total) by mouth daily.    [name] atorvastatin (LIPITOR) 40 MG tablet Take 40 mg by mouth daily.     [name] CALCIUM CARBONATE PO Take 1 tablet by mouth 2 (two) times daily.    . carvedilol (COREG) 6.25 MG tablet Take 1 tablet (6.25 mg total) by mouth 2 (two) times daily with a meal. 180 tablet 1  . digoxin (LANOXIN) 0.125 MG tablet Take 0.125 mg by mouth daily.     [name] esomeprazole (NEXIUM) 40 MG capsule Take 40 mg by mouth daily as needed (for acid reflux).     . fluticasone-salmeterol (ADVAIR HFA) 115-21 MCG/ACT inhaler Inhale 1 puff into the lungs 2 (two) times daily.    .  furosemide (LASIX) 40 MG tablet Take 20-40 mg by mouth 2 (two) times daily. Take 40 mg by mouth in the morning and take 20 mg by mouth in the evening for fluid.    [name] glimepiride (AMARYL) 1 MG tablet Take 1 mg by mouth daily with breakfast.    . Ipratropium-Albuterol (COMBIVENT) 20-100 MCG/ACT AERS respimat Inhale 1 puff into the lungs every 6 (six) hours as needed for wheezing or shortness of breath.     [name] ipratropium-albuterol (DUONEB) 0.5-2.5 (3) MG/3ML SOLN Take 3 mLs by nebulization every 6 (six) hours as needed (for shortness of breath).     [name] levothyroxine (SYNTHROID, LEVOTHROID) 100 MCG tablet Take 100 mcg by mouth daily before breakfast.     . potassium chloride SA (K-DUR,KLOR-CON) 20 MEQ tablet Take 20 mEq by mouth daily.     [name] rOPINIRole (REQUIP) 4 MG tablet Take 4 mg by mouth at bedtime.    . vitamin C (ASCORBIC ACID) 500 MG tablet Take 500 mg by mouth daily.     No current facility-administered medications for this visit.    Allergies  Allergen Reactions  . Coumadin [Warfarin Sodium] Other (See Comments)    Reaction: Bleeding from ears  . Prozac [Fluoxetine Hcl] Rash    Review of Systems  Constitutional: Positive for activity change and fatigue. Negative for fever, chills and unexpected weight change.  HENT: Positive for hearing loss. Negative for congestion.   Respiratory: Positive for cough, shortness of breath and wheezing.        Home O2, 2 L nasal cannula  Cardiovascular: Positive for palpitations and leg swelling. Negative for chest pain.       Varicose veins  Gastrointestinal:       Reflux  Musculoskeletal: Positive for joint swelling and arthralgias.  Hematological: Positive for adenopathy. Bruises/bleeds easily.    BP 127/57 mmHg  Pulse 66  Resp 20  Ht '5\' 1"'$  (1.549 m)  Wt 145 lb (65.772 kg)  BMI 27.41 kg/m2  SpO2 95% Physical Exam  Constitutional: Marissa [name] is oriented to person, place, and time.  Elderly woman appears chronically ill, in no acute distress   HENT:  Head: Normocephalic and atraumatic.  Mouth/Throat: No oropharyngeal exudate.  Eyes: Conjunctivae and EOM are normal. No scleral icterus.  Neck: Neck supple. No tracheal deviation present. No thyromegaly present.  Cardiovascular:  Murmur (2 to 3/6 systolic) heard. Irregularly irregular  Pulmonary/Chest:  Bronchial breath sounds right apex, no wheezing  Abdominal: Soft. Marissa [name] exhibits no distension. There is no tenderness.  Musculoskeletal: Marissa [name] exhibits edema.  Lymphadenopathy:  Marissa [name] has no cervical adenopathy.  Neurological: Marissa [name] is alert and oriented to person, place, and time. No cranial nerve deficit.  Heart appearing. Moves slowly, no focal deficit  Skin: Skin is warm and dry.  Vitals reviewed.    Diagnostic Tests: NUCLEAR MEDICINE PET SKULL BASE TO THIGH  TECHNIQUE: 7.5 mCi F-18 FDG was injected intravenously. Full-ring PET imaging was performed from the skull base to thigh after the radiotracer. CT data was obtained and used for attenuation correction and anatomic localization.  FASTING BLOOD GLUCOSE: Value: 183 mg/dl  COMPARISON: PET-CT 12/16/2013. Chest CT 10/13/2014.  FINDINGS: NECK  No hypermetabolic cervical lymph nodes are identified.There are no lesions of the pharyngeal mucosal space. There is stable homogeneous thyroid activity. Intracranial atrophy noted.  CHEST  The enlarging mediastinal lymph nodes demonstrated on recent CT are hypermetabolic. Low right paratracheal node measuring approximately 14 mm short axis on image 61 has an SUV max of 6.2. 1.3 cm subcarinal node has an SUV max of 7.2. 11 mm node anterior to the right hilum has an SUV max of 7.6. There is low-level metabolic activity throughout the right upper lobe consolidation, most likely representing radiation pneumonitis. However, along the posterior margin of this process, there is residual focal hypermetabolic activity with an SUV max of 12.0. This corresponds with the  original lesion and appears slightly larger than on prior PET-CT, measuring up to 2.7 cm on image 20. No other suspicious pulmonary activity. There is linear left lower lobe scarring or atelectasis. Diffuse atherosclerosis noted status post median sternotomy, CABG and valve replacement. There are trace pleural effusions.  ABDOMEN/PELVIS  There is no hypermetabolic activity within the liver, adrenal glands, spleen or pancreas. There is no hypermetabolic nodal activity. Urine contamination noted in the perineal region and anal crease. Hepatic steatosis, right renal cyst, atherosclerosis and previous pelvic lymph node dissection noted.  SKELETON  There is no hypermetabolic activity to suggest osseous metastatic disease.  IMPRESSION: 1. Enlarging focus of hypermetabolism at site of original right upper lobe lesion consistent with recurrent disease. Surrounding pulmonary opacities demonstrate only low level metabolic activity, most consistent with radiation pneumonitis. 2. New right hilar and mediastinal nodal metastases. 3. No distant metastases identified.   Electronically Signed  By: Richardean Sale M.D.  On: 11/10/2014 14:49  Impression: 81 year old woman with multiple medical problems including tobacco abuse, COPD, and a stage IA non-small cell lung cancer treated with stereotactic radiation about a year ago. Marissa [name] recently had a CT of the chest showing radiation changes but also the suggestion of a mass lesion in the right upper lobe. There was also hilar and mediastinal adenopathy. On PET CT there is an enlarging focus of hypermetabolism at the site of the original lesion with surrounding radiation change. The hilar and mediastinal nodes were hypermetabolic as well. This is highly suspicious for recurrent lung cancer, but Marissa [name] needs a biopsy to confirm the diagnosis in order to guide therapy.  I recommended to her that we proceed with electromagnetic navigational  bronchoscopy and endobronchial ultrasound. This will allow [place] to sample both the right upper lobe lesion as well as the mediastinal and hilar lymph nodes. Marissa [name] is familiar with the procedure as Marissa [name] had it done about a year ago. I again reviewed the general nature of the procedure, the need for general anesthesia, and the outpatient nature of the procedure. Marissa [name] and her daughter do understand that there is no guarantee that definitive diagnosis will be made. I reviewed the indications, risks, benefits, and alternatives.  They understand the risk include those associated with general anesthesia(and death, MI, stroke, DVT, PE), bleeding, pneumothorax, failure to make a diagnosis, as well as the possibility of other unforeseeable complications.  Marissa [name] accepts the risks and agrees to proceed  Plan:  Electromagnetic navigational bronchoscopy and endobronchial ultrasound on Friday, 11/21/2014  [name], MD [facility] [phone]

## 2014-11-20 ENCOUNTER — Ambulatory Visit
Admission: RE | Admit: 2014-11-20 | Discharge: 2014-11-20 | Disposition: A | Discharge status: Home / Self Care | Payer: Medicare Other | Source: Ambulatory Visit | Attending: Thoracic Surgery (Cardiothoracic Vascular Surgery) | Admitting: Thoracic Surgery (Cardiothoracic Vascular Surgery)

## 2014-11-20 ENCOUNTER — Encounter (HOSPITAL_COMMUNITY): Payer: Self-pay | Admitting: *Deleted

## 2014-11-20 DIAGNOSIS — C3490 Malignant neoplasm of unspecified part of unspecified bronchus or lung: Secondary | ICD-10-CM | POA: Diagnosis not present

## 2014-11-20 DIAGNOSIS — R911 Solitary pulmonary nodule: Secondary | ICD-10-CM

## 2014-11-21 ENCOUNTER — Ambulatory Visit (HOSPITAL_COMMUNITY): Payer: Medicare Other | Admitting: Certified Registered"

## 2014-11-21 ENCOUNTER — Ambulatory Visit (HOSPITAL_COMMUNITY): Payer: Medicare Other

## 2014-11-21 ENCOUNTER — Encounter (HOSPITAL_COMMUNITY): Payer: Self-pay | Admitting: *Deleted

## 2014-11-21 ENCOUNTER — Ambulatory Visit (HOSPITAL_COMMUNITY)
Admission: RE | Admit: 2014-11-21 | Discharge: 2014-11-21 | Disposition: A | Discharge status: Home / Self Care | Payer: Medicare Other | Source: Ambulatory Visit | Attending: Thoracic Surgery (Cardiothoracic Vascular Surgery) | Admitting: Thoracic Surgery (Cardiothoracic Vascular Surgery)

## 2014-11-21 ENCOUNTER — Telehealth: Payer: Self-pay | Admitting: *Deleted

## 2014-11-21 ENCOUNTER — Encounter (HOSPITAL_COMMUNITY)
Admission: RE | Disposition: A | Discharge status: Home / Self Care | Payer: Self-pay | Source: Ambulatory Visit | Attending: Thoracic Surgery (Cardiothoracic Vascular Surgery)

## 2014-11-21 DIAGNOSIS — I2510 Atherosclerotic heart disease of native coronary artery without angina pectoris: Secondary | ICD-10-CM | POA: Diagnosis not present

## 2014-11-21 DIAGNOSIS — C3411 Malignant neoplasm of upper lobe, right bronchus or lung: Secondary | ICD-10-CM | POA: Diagnosis not present

## 2014-11-21 DIAGNOSIS — Z7982 Long term (current) use of aspirin: Secondary | ICD-10-CM | POA: Insufficient documentation

## 2014-11-21 DIAGNOSIS — Z952 Presence of prosthetic heart valve: Secondary | ICD-10-CM | POA: Diagnosis not present

## 2014-11-21 DIAGNOSIS — R590 Localized enlarged lymph nodes: Secondary | ICD-10-CM | POA: Diagnosis not present

## 2014-11-21 DIAGNOSIS — R918 Other nonspecific abnormal finding of lung field: Secondary | ICD-10-CM | POA: Diagnosis not present

## 2014-11-21 DIAGNOSIS — E780 Pure hypercholesterolemia: Secondary | ICD-10-CM | POA: Diagnosis not present

## 2014-11-21 DIAGNOSIS — R911 Solitary pulmonary nodule: Secondary | ICD-10-CM | POA: Diagnosis not present

## 2014-11-21 DIAGNOSIS — I509 Heart failure, unspecified: Secondary | ICD-10-CM | POA: Diagnosis not present

## 2014-11-21 DIAGNOSIS — Z01818 Encounter for other preprocedural examination: Secondary | ICD-10-CM | POA: Diagnosis not present

## 2014-11-21 DIAGNOSIS — Z9889 Other specified postprocedural states: Secondary | ICD-10-CM

## 2014-11-21 DIAGNOSIS — J449 Chronic obstructive pulmonary disease, unspecified: Secondary | ICD-10-CM | POA: Insufficient documentation

## 2014-11-21 DIAGNOSIS — R222 Localized swelling, mass and lump, trunk: Secondary | ICD-10-CM | POA: Diagnosis not present

## 2014-11-21 DIAGNOSIS — C771 Secondary and unspecified malignant neoplasm of intrathoracic lymph nodes: Secondary | ICD-10-CM | POA: Diagnosis not present

## 2014-11-21 DIAGNOSIS — Z87891 Personal history of nicotine dependence: Secondary | ICD-10-CM | POA: Insufficient documentation

## 2014-11-21 DIAGNOSIS — Z9981 Dependence on supplemental oxygen: Secondary | ICD-10-CM | POA: Insufficient documentation

## 2014-11-21 DIAGNOSIS — Z419 Encounter for procedure for purposes other than remedying health state, unspecified: Secondary | ICD-10-CM

## 2014-11-21 DIAGNOSIS — I10 Essential (primary) hypertension: Secondary | ICD-10-CM | POA: Insufficient documentation

## 2014-11-21 HISTORY — DX: Type 2 diabetes mellitus without complications: E11.9

## 2014-11-21 HISTORY — PX: VIDEO BRONCHOSCOPY WITH ENDOBRONCHIAL ULTRASOUND: SHX6177

## 2014-11-21 HISTORY — DX: Pneumonia, unspecified organism: J18.9

## 2014-11-21 HISTORY — PX: VIDEO BRONCHOSCOPY WITH ENDOBRONCHIAL NAVIGATION: SHX6175

## 2014-11-21 LAB — COMPREHENSIVE METABOLIC PANEL
ALBUMIN: 3.4 g/dL — AB (ref 3.5–5.0)
ALK PHOS: 80 U/L (ref 38–126)
ALT: 20 U/L (ref 14–54)
AST: 22 U/L (ref 15–41)
Anion gap: 7 (ref 5–15)
BUN: 21 mg/dL — AB (ref 6–20)
CALCIUM: 9.3 mg/dL (ref 8.9–10.3)
CO2: 32 mmol/L (ref 22–32)
CREATININE: 0.88 mg/dL (ref 0.44–1.00)
Chloride: 100 mmol/L — ABNORMAL LOW (ref 101–111)
GFR calc Af Amer: >60 mL/min (ref >60)
GFR calc non Af Amer: >60 mL/min (ref >60)
GLUCOSE: 155 mg/dL — AB (ref 65–99)
Potassium: 3.8 mmol/L (ref 3.5–5.1)
SODIUM: 139 mmol/L (ref 135–145)
Total Bilirubin: 0.8 mg/dL (ref 0.3–1.2)
Total Protein: 6.6 g/dL (ref 6.5–8.1)

## 2014-11-21 LAB — GLUCOSE, CAPILLARY: Glucose-Capillary: 138 mg/dL — ABNORMAL HIGH (ref 65–99)

## 2014-11-21 LAB — CBC
HCT: 39.5 % (ref 36.0–46.0)
HEMOGLOBIN: 12.5 g/dL (ref 12.0–15.0)
MCH: 27.4 pg (ref 26.0–34.0)
MCHC: 31.6 g/dL (ref 30.0–36.0)
MCV: 86.4 fL (ref 78.0–100.0)
Platelets: 201 10*3/uL (ref 150–400)
RBC: 4.57 MIL/uL (ref 3.87–5.11)
RDW: 15.6 % — ABNORMAL HIGH (ref 11.5–15.5)
WBC: 10.3 10*3/uL (ref 4.0–10.5)

## 2014-11-21 LAB — PROTIME-INR
INR: 1.09 (ref 0.00–1.49)
Prothrombin Time: 14.3 seconds (ref 11.6–15.2)

## 2014-11-21 LAB — APTT: APTT: 34 s (ref 24–37)

## 2014-11-21 SURGERY — VIDEO BRONCHOSCOPY WITH ENDOBRONCHIAL NAVIGATION
Anesthesia: General

## 2014-11-21 MED ORDER — FENTANYL CITRATE (PF) 100 MCG/2ML IJ SOLN
INTRAMUSCULAR | Status: DC | PRN
Start: 2014-11-21 — End: 2014-11-21
  Administered 2014-11-21: 100 ug via INTRAVENOUS

## 2014-11-21 MED ORDER — HYDROMORPHONE HCL 1 MG/ML IJ SOLN
0.2500 mg | INTRAMUSCULAR | Status: DC | PRN
  Administered 2014-11-21: 0.5 mg via INTRAVENOUS

## 2014-11-21 MED ORDER — LIDOCAINE HCL (CARDIAC) 20 MG/ML IV SOLN
INTRAVENOUS | Status: DC | PRN
  Administered 2014-11-21: 60 mg via INTRAVENOUS

## 2014-11-21 MED ORDER — SODIUM CHLORIDE 0.9 % IJ SOLN
INTRAMUSCULAR | Status: AC
  Filled 2014-11-21: qty 10

## 2014-11-21 MED ORDER — ROCURONIUM BROMIDE 100 MG/10ML IV SOLN
INTRAVENOUS | Status: DC | PRN
  Administered 2014-11-21: 50 mg via INTRAVENOUS

## 2014-11-21 MED ORDER — FENTANYL CITRATE (PF) 250 MCG/5ML IJ SOLN
INTRAMUSCULAR | Status: AC
  Filled 2014-11-21: qty 5

## 2014-11-21 MED ORDER — NEOSTIGMINE METHYLSULFATE 10 MG/10ML IV SOLN
INTRAVENOUS | Status: AC
  Filled 2014-11-21: qty 1

## 2014-11-21 MED ORDER — PROPOFOL 10 MG/ML IV BOLUS
INTRAVENOUS | Status: DC | PRN
  Administered 2014-11-21: 140 mg via INTRAVENOUS

## 2014-11-21 MED ORDER — ONDANSETRON HCL 4 MG/2ML IJ SOLN
INTRAMUSCULAR | Status: AC
  Filled 2014-11-21: qty 2

## 2014-11-21 MED ORDER — EPINEPHRINE HCL 1 MG/ML IJ SOLN
INTRAMUSCULAR | Status: AC
  Filled 2014-11-21: qty 1

## 2014-11-21 MED ORDER — IPRATROPIUM-ALBUTEROL 0.5-2.5 (3) MG/3ML IN SOLN
3.0000 mL | Freq: Once | RESPIRATORY_TRACT | Status: AC
  Administered 2014-11-21: 3 mL via RESPIRATORY_TRACT

## 2014-11-21 MED ORDER — PHENYLEPHRINE 40 MCG/ML (10ML) SYRINGE FOR IV PUSH (FOR BLOOD PRESSURE SUPPORT)
PREFILLED_SYRINGE | INTRAVENOUS | Status: AC
  Filled 2014-11-21: qty 10

## 2014-11-21 MED ORDER — PHENYLEPHRINE HCL 10 MG/ML IJ SOLN
INTRAMUSCULAR | Status: DC | PRN
  Administered 2014-11-21 (×2): 40 ug via INTRAVENOUS

## 2014-11-21 MED ORDER — 0.9 % SODIUM CHLORIDE (POUR BTL) OPTIME
TOPICAL | Status: DC | PRN
  Administered 2014-11-21: 1000 mL

## 2014-11-21 MED ORDER — NEOSTIGMINE METHYLSULFATE 10 MG/10ML IV SOLN
INTRAVENOUS | Status: DC | PRN
  Administered 2014-11-21: 4 mg via INTRAVENOUS

## 2014-11-21 MED ORDER — LIDOCAINE HCL (PF) 1 % IJ SOLN
INTRAMUSCULAR | Status: AC
  Filled 2014-11-21: qty 30

## 2014-11-21 MED ORDER — LIDOCAINE HCL 4 % MT SOLN
OROMUCOSAL | Status: DC | PRN
  Administered 2014-11-21: 4 mL via TOPICAL

## 2014-11-21 MED ORDER — GLYCOPYRROLATE 0.2 MG/ML IJ SOLN
INTRAMUSCULAR | Status: DC | PRN
  Administered 2014-11-21: 0.6 mg via INTRAVENOUS

## 2014-11-21 MED ORDER — PROPOFOL 10 MG/ML IV BOLUS
INTRAVENOUS | Status: AC
  Filled 2014-11-21: qty 20

## 2014-11-21 MED ORDER — LIDOCAINE HCL (CARDIAC) 20 MG/ML IV SOLN
INTRAVENOUS | Status: AC
  Filled 2014-11-21: qty 10

## 2014-11-21 MED ORDER — ROCURONIUM BROMIDE 50 MG/5ML IV SOLN
INTRAVENOUS | Status: AC
  Filled 2014-11-21: qty 1

## 2014-11-21 MED ORDER — EPHEDRINE SULFATE 50 MG/ML IJ SOLN
INTRAMUSCULAR | Status: AC
  Filled 2014-11-21: qty 1

## 2014-11-21 MED ORDER — IPRATROPIUM-ALBUTEROL 0.5-2.5 (3) MG/3ML IN SOLN
RESPIRATORY_TRACT | Status: AC
  Administered 2014-11-21: 3 mL
  Filled 2014-11-21: qty 3

## 2014-11-21 MED ORDER — EPINEPHRINE HCL 1 MG/ML IJ SOLN
INTRAMUSCULAR | Status: DC | PRN
  Administered 2014-11-21: 1 mg

## 2014-11-21 MED ORDER — LACTATED RINGERS IV SOLN
INTRAVENOUS | Status: DC | PRN
  Administered 2014-11-21: 07:00:00 via INTRAVENOUS

## 2014-11-21 MED ORDER — ARTIFICIAL TEARS OP OINT
TOPICAL_OINTMENT | OPHTHALMIC | Status: DC | PRN
Start: 2014-11-21 — End: 2014-11-21
  Administered 2014-11-21: 1 via OPHTHALMIC

## 2014-11-21 MED ORDER — GLYCOPYRROLATE 0.2 MG/ML IJ SOLN
INTRAMUSCULAR | Status: AC
  Filled 2014-11-21: qty 3

## 2014-11-21 MED ORDER — HYDROMORPHONE HCL 1 MG/ML IJ SOLN
INTRAMUSCULAR | Status: AC
  Filled 2014-11-21: qty 1

## 2014-11-21 MED ORDER — PROMETHAZINE HCL 25 MG/ML IJ SOLN: 6.2500 mg | INTRAMUSCULAR | Status: DC | PRN

## 2014-11-21 MED ORDER — ARTIFICIAL TEARS OP OINT
TOPICAL_OINTMENT | OPHTHALMIC | Status: AC
  Filled 2014-11-21: qty 3.5

## 2014-11-21 MED ORDER — ONDANSETRON HCL 4 MG/2ML IJ SOLN
INTRAMUSCULAR | Status: DC | PRN
  Administered 2014-11-21: 4 mg via INTRAVENOUS

## 2014-11-21 MED ORDER — PHENYLEPHRINE HCL 10 MG/ML IJ SOLN
10.0000 mg | INTRAVENOUS | Status: DC | PRN
  Administered 2014-11-21: 15 ug/min via INTRAVENOUS

## 2014-11-21 SURGICAL SUPPLY — 43 items
BRUSH CYTOL CELLEBRITY 1.5X140 (MISCELLANEOUS) IMPLANT
BRUSH SUPERTRAX BIOPSY (INSTRUMENTS) IMPLANT
BRUSH SUPERTRAX NDL-TIP CYTO (INSTRUMENTS) ×3 IMPLANT
CANISTER SUCTION 2500CC (MISCELLANEOUS) ×6 IMPLANT
CHANNEL WORK EXTEND EDGE 180 (KITS) IMPLANT
CHANNEL WORK EXTEND EDGE 45 (KITS) IMPLANT
CHANNEL WORK EXTEND EDGE 90 (KITS) IMPLANT
CONT SPEC 4OZ CLIKSEAL STRL BL (MISCELLANEOUS) ×9 IMPLANT
COTTONBALL LRG STERILE PKG (GAUZE/BANDAGES/DRESSINGS) IMPLANT
COVER TABLE BACK 60X90 (DRAPES) ×6 IMPLANT
FILTER STRAW FLUID ASPIR (MISCELLANEOUS) IMPLANT
FORCEPS BIOP RJ4 1.8 (CUTTING FORCEPS) IMPLANT
FORCEPS BIOP SUPERTRX PREMAR (INSTRUMENTS) ×3 IMPLANT
GAUZE SPONGE 4X4 12PLY STRL (GAUZE/BANDAGES/DRESSINGS) ×3 IMPLANT
GLOVE SURG SIGNA 7.5 PF LTX (GLOVE) ×6 IMPLANT
GLOVE SURG SS PI 7.0 STRL IVOR (GLOVE) ×3 IMPLANT
GOWN STRL REUS W/ TWL XL LVL3 (GOWN DISPOSABLE) ×4 IMPLANT
GOWN STRL REUS W/TWL XL LVL3 (GOWN DISPOSABLE) ×8
KIT CLEAN ENDO COMPLIANCE (KITS) ×6 IMPLANT
KIT PROCEDURE EDGE 180 (KITS) ×3 IMPLANT
KIT PROCEDURE EDGE 45 (KITS) IMPLANT
KIT PROCEDURE EDGE 90 (KITS) IMPLANT
KIT ROOM TURNOVER OR (KITS) ×6 IMPLANT
MARKER SKIN DUAL TIP RULER LAB (MISCELLANEOUS) ×6 IMPLANT
NEEDLE 22X1 1/2 (OR ONLY) (NEEDLE) IMPLANT
NEEDLE BIOPSY TRANSBRONCH 21G (NEEDLE) IMPLANT
NEEDLE BLUNT 18X1 FOR OR ONLY (NEEDLE) IMPLANT
NEEDLE SONO TIP II EBUS (NEEDLE) ×3 IMPLANT
NEEDLE SUPERTRX PREMARK BIOPSY (NEEDLE) ×3 IMPLANT
NS IRRIG 1000ML POUR BTL (IV SOLUTION) ×6 IMPLANT
OIL SILICONE PENTAX (PARTS (SERVICE/REPAIRS)) ×3 IMPLANT
PAD ARMBOARD 7.5X6 YLW CONV (MISCELLANEOUS) ×12 IMPLANT
PATCHES PATIENT (LABEL) ×9 IMPLANT
SYR 20CC LL (SYRINGE) ×6 IMPLANT
SYR 20ML ECCENTRIC (SYRINGE) ×9 IMPLANT
SYR 30ML LL (SYRINGE) ×3 IMPLANT
SYR 5ML LL (SYRINGE) ×6 IMPLANT
SYR 5ML LUER SLIP (SYRINGE) ×3 IMPLANT
SYR CONTROL 10ML LL (SYRINGE) IMPLANT
TOWEL OR 17X24 6PK STRL BLUE (TOWEL DISPOSABLE) ×6 IMPLANT
TRAP SPECIMEN MUCOUS 40CC (MISCELLANEOUS) ×6 IMPLANT
TUBE CONNECTING 20'X1/4 (TUBING) ×3
TUBE CONNECTING 20X1/4 (TUBING) ×6 IMPLANT

## 2014-11-21 NOTE — Discharge Instructions (Signed)
Do not drive or engage in heavy physical activities fro 24 hours  You may resume normal activities tomorrow  You may cough up small amounts of blood over the next few days  Call [phone] if you develop chest pain, shortness of breath, a fever > 101 or cough up large amounts of blood (> 1/4 cup)  Norton Blizzard, Thoracic Oncology Navigator, will contact you with follow up information

## 2014-11-21 NOTE — Anesthesia Preprocedure Evaluation (Addendum)
Anesthesia Evaluation  Patient identified by MRN, date of birth, ID band Patient awake    Reviewed: Allergy & Precautions, NPO status , Patient's Chart, lab work & pertinent test results  Airway Mallampati: II  TM Distance: >3 FB Neck ROM: Full    Dental  (+) Dental Advisory Given   Pulmonary shortness of breath, COPDformer smoker,  Pt with frequent cough, with clear/yellow sputum production. Pt appears mildly SOB. Pt does not tolerate lying flat--inc RR and very anxious.  + rhonchi         Cardiovascular hypertension, + CAD, + Past MI, + CABG and +CHF + dysrhythmias + Valvular Problems/Murmurs Rhythm:Regular Rate:Normal + Systolic murmurs    Neuro/Psych negative neurological ROS  negative psych ROS   GI/Hepatic Neg liver ROS, GERD-  ,  Endo/Other  diabetesHypothyroidism   Renal/GU negative Renal ROS     Musculoskeletal   Abdominal   Peds  Hematology   Anesthesia Other Findings   Reproductive/Obstetrics                         Anesthesia Physical Anesthesia Plan  ASA: III  Anesthesia Plan: General   Post-op Pain Management:    Induction: Intravenous  Airway Management Planned: Oral ETT  Additional Equipment:   Intra-op Plan:   Post-operative Plan: Possible Post-op intubation/ventilation  Informed Consent: I have reviewed the patients History and Physical, chart, labs and discussed the procedure including the risks, benefits and alternatives for the proposed anesthesia with the patient or authorized representative who has indicated his/her understanding and acceptance.   Dental advisory given  Plan Discussed with: CRNA, Anesthesiologist and Surgeon  Anesthesia Plan Comments:        Anesthesia Quick Evaluation

## 2014-11-21 NOTE — Anesthesia Procedure Notes (Signed)
Procedure Name: Intubation Date/Time: 11/21/2014 7:33 AM Performed by: Gaylene Brooks Pre-anesthesia Checklist: Patient identified, Timeout performed, Emergency Drugs available, Suction available and Patient being monitored Patient Re-evaluated:Patient Re-evaluated prior to inductionOxygen Delivery Method: Circle system utilized Preoxygenation: Pre-oxygenation with 100% oxygen Intubation Type: IV induction Ventilation: Mask ventilation without difficulty Laryngoscope Size: Miller and 2 Grade View: Grade II Tube type: Oral Tube size: 8.5 mm Number of attempts: 1 Airway Equipment and Method: Stylet and LTA kit utilized Placement Confirmation: ETT inserted through vocal cords under direct vision,  breath sounds checked- equal and bilateral,  positive ETCO2 and CO2 detector Secured at: 22 cm Tube secured with: Tape Dental Injury: Teeth and Oropharynx as per pre-operative assessment

## 2014-11-21 NOTE — Anesthesia Postprocedure Evaluation (Signed)
  Anesthesia Post-op Note  Patient: Marissa [name]  Procedure(s) Performed: Procedure(s): VIDEO BRONCHOSCOPY WITH ENDOBRONCHIAL NAVIGATION (N/A) VIDEO BRONCHOSCOPY WITH ENDOBRONCHIAL ULTRASOUND (N/A)  Patient Location: PACU  Anesthesia Type:General  Level of Consciousness: awake  Airway and Oxygen Therapy: Patient Spontanous Breathing  Post-op Pain: mild  Post-op Assessment: Post-op Vital signs reviewed              Post-op Vital Signs: Reviewed  Last Vitals:  Filed Vitals:   11/21/14 1045  BP: 112/49  Pulse: 63  Temp:   Resp: 18    Complications: No apparent anesthesia complications

## 2014-11-21 NOTE — Op Note (Signed)
[name], [name] NO.:  [id number]  MEDICAL RECORD NO.:  [id number]  LOCATION:  [facility]                         FACILITY:  [facility]  PHYSICIAN:  Revonda Standard. Roxan Hockey, M.D.DATE OF BIRTH:  [birth year]  DATE OF PROCEDURE:  11/21/2014 DATE OF DISCHARGE:  11/21/2014                              OPERATIVE REPORT   PREOPERATIVE DIAGNOSIS:  Right upper lobe mass and mediastinal adenopathy.  POSTOPERATIVE DIAGNOSIS:  Right upper lobe mass and mediastinal adenopathy secondary to recurrent non-small cell carcinoma.  PROCEDURE:  Electromagnetic navigational bronchoscopy with needle aspirations, brushings, and biopsies.  Endobronchial ultrasound with mediastinal lymph node aspirations.  SURGEON:  Revonda Standard. Roxan Hockey, M.D.  ASSISTANT:  None.  ANESTHESIA:  General.  FINDINGS:  Enlarged level 4R and 7 lymph nodes.  Initial aspirations were bloody and nondiagnostic.  Repeat aspiration of 4R node showed non-small cell carcinoma. Needle aspirations and brushings of right upper lobe mass revealed non-small cell carcinoma.  CLINICAL NOTE:  Marissa [name] is a 81 year old woman with a history of stage IA non-small cell carcinoma, treated with stereotactic radiation about a year ago.  On a recent followup scan there was an enlarging mass in the right upper lobe near the site of her original treatment, as well as mediastinal adenopathy.  A PET-CT showed the right upper lobe mass and mediastinal and hilar lymph nodes were hypermetabolic.  She was advised to undergo bronchoscopy and endobronchial ultrasound for diagnosis and staging purposes.  The indications, risks, benefits, and alternatives were discussed in detail with the patient.  She understood and accepted the risks and agreed to proceed.  OPERATIVE NOTE:  Marissa [name] was brought to the operating room on November 21, 2014.  She had induction of general anesthesia and was intubated.  Sequential compressive devices  were placed on the calves for DVT prophylaxis.  Flexible fiberoptic bronchoscopy was performed via the endotracheal tube.  The left bronchial tree was normal in appearance and anatomy with no endobronchial lesions to the level of subsegmental bronchi.  On the right side, there was edema of the mucosa, particularly around the right upper lobe, extending up to the takeoff from the trachea laterally and also down along the bronchus intermedius.  There was less edema around the middle and lower lobe bronchi.  The bronchoscope was removed.  The endobronchial ultrasound probe was advanced. Enlarged level 7 and 4 nodes were noted.  There also were some enlarged hilar nodes. There was not a good window for aspirating the hilar nodes, but a good window was present for aspirating the subcarinal nodes.  Needle aspiration was performed.  With each aspiration, the needle was advanced into the lymph node with ultrasound guidance and then 10 or more passes were performed with the needle with suction applied.  A portion of the specimen was placed on sides for immediate preparation and the remainder was placed into cytologic fluid.  Four aspirations were performed of the level 7 node.  The scope then was withdrawn slightly and the 4R node was visualized and additional aspirations were performed of this node as well.  Of note, there was some blood noted in each of these specimens.  These specimens were sent to Pathology for  review. Additional specimens were obtained from the 4R node and with these specimens, no blood was noted.  The first aspiration was placed on Slides. The remaining aspirations were placed only into the cytologic fluid for cell block.  The initial aspirations returned showing mostly blood and were nondiagnostic.  The endobronchial ultrasound probe was removed.  The bronchoscope was replaced and the locatable guide for the navigational bronchoscopy was advanced through the scope.   Registration was performed.  There was good correlation of the video and virtual bronchoscopy.  The bronchoscope then was directed to the right upper lobe orifice and the posterior segmental bronchus was cannulated with the locatable guide. The guide was advanced to within 1 cm of the nodule. The locatable guide was periodically reinserted during the sampling to ensure that the tip of the sheath was still in a good position in relation to the mass.  Needle aspirations were performed first.  It was performed in a similar fashion to the nodal aspirations.  Next, a needle brush was used to obtain multiple samples.  These specimens were sent to Pathology.  While awaiting those results, multiple biopsies were taken. A few of the biopsies were sent for frozen section, but the majority were sent for permanent pathology.  A total of 15 biopsies were taken.  The needle aspirations and brushings of the right upper lobe mass showed non-small cell carcinoma, as did the second set of samples sent from the level 4R lymph node. As additional tissue was already available for molecular testing, the bronchoscope was withdrawn.  After making a final inspection for hemostasis, the patient then was extubated in the operating room and taken to the postanesthetic care unit in good condition.     Revonda Standard Roxan Hockey, M.D.     SCH/MEDQ  D:  11/21/2014  T:  11/21/2014  Job:  [id number]

## 2014-11-21 NOTE — Interval H&P Note (Signed)
History and Physical Interval Note:  11/21/2014 7:18 AM  Marissa [name]  has presented today for surgery, with the diagnosis of RIGHT LUNG MASS MEDIASTINAL ADENOPATHY  The various methods of treatment have been discussed with the patient and family. After consideration of risks, benefits and other options for treatment, the patient has consented to  Procedure(s): VIDEO BRONCHOSCOPY WITH ENDOBRONCHIAL NAVIGATION (N/A) VIDEO BRONCHOSCOPY WITH ENDOBRONCHIAL ULTRASOUND (N/A) as a surgical intervention .  The patient's history has been reviewed, patient examined, no change in status, stable for surgery.  I have reviewed the patient's chart and labs.  Questions were answered to the patient's satisfaction.     [name]

## 2014-11-21 NOTE — H&P (View-Only) (Signed)
PCP is [name], MD Referring Provider is Molli Barrows, MD  Chief Complaint  Patient presents with  . Lung Lesion    Eval for possible EBUS, PET Scan 11/10/14, Chest CT 10/13/14     HPI: Marissa [name] is a 81 year old woman with a history of stage IA non-small cell carcinoma of the lung treated with SBRT last year. Her PET/CT prior to treatment showed the right upper lobe nodule was hypermetabolic with an SUV of 5.1. She had enlarged mediastinal lymph nodes on CT which were not hypermetabolic. Level 4R and 7 nodes were sampled with EBUS and were negative for tumor.  She recently had a follow-up scan which showed radiation changes in the right upper lobe, but with the suggestion of a mass lesion in the vicinity. In addition there was enlargement of the right paratracheal nodes. A PET CT was done which showed the right upper lobe mass and paratracheal lymph nodes were markedly hypermetabolic.  She tolerated her radiation treatments without any major complication. She says that since then she's had a more frequent cough. She has not had any hemoptysis. Her cough is productive of clear to yellow sputum. She is short of breath due to her COPD and is using 2 L of nasal cannula oxygen. She has atrial fibrillation but does not take Coumadin due to bleeding complications. She has a history of coronary bypass grafting and mitral valve replacement porcine valve in 2013.   Past Medical History  Diagnosis Date  . HTN (hypertension)   . Hypercholesteremia   . CAD (coronary artery disease)     s/p CABG 03/2011  . Aortic valve disorders   . RLS (restless legs syndrome)   . A-fib   . Osteopenia   . Mitral valve disease     s/p MVR 03/2011 w/ 27 mm porcine valve  . Hypothyroidism   . COPD (chronic obstructive pulmonary disease)   . CHF (congestive heart failure)   . Aortic stenosis     (s/p AVR w/ 21 mm porcine valve 03/2011  . Mastoiditis   . Myocardial infarction     "slight"  . Dysrhythmia    . Shortness of breath     with exertion  . Pre-diabetes   . GERD (gastroesophageal reflux disease)     takes nexium prn  . Arthritis   . History of blood transfusion     Rheumatic Fever  . Lung cancer     Past Surgical History  Procedure Laterality Date  . Mitral valve replacement      1990's  . Laparoscopic cholecystectomy    . Aortic valve replacement    . Tonsillectomy    . Coronary artery bypass graft  03/2011    1990's also  . Video bronchoscopy with endobronchial navigation N/A 01/06/2014    Procedure: VIDEO BRONCHOSCOPY WITH ENDOBRONCHIAL NAVIGATION;  Surgeon: [name], MD;  Location: [place];  Service: Thoracic;  Laterality: N/A;  . Video bronchoscopy with endobronchial ultrasound N/A 01/06/2014    Procedure: VIDEO BRONCHOSCOPY WITH ENDOBRONCHIAL ULTRASOUND;  Surgeon: [name], MD;  Location: [facility] OR;  Service: Thoracic;  Laterality: N/A;    Family History  Problem Relation Age of Onset  . Adopted: Yes  . Cancer Brother     lung  . Heart attack Neg Hx   . Stroke Neg Hx     Social History Social History  Substance Use Topics  . Smoking status: Former Smoker -- 0.50 packs/day for 30 years    Types:  Cigarettes  . Smokeless tobacco: Never Used  . Alcohol Use: No    Current Outpatient Prescriptions  Medication Sig Dispense Refill  . aspirin EC 81 MG tablet Take 1 tablet (81 mg total) by mouth daily.    [name] atorvastatin (LIPITOR) 40 MG tablet Take 40 mg by mouth daily.     [name] CALCIUM CARBONATE PO Take 1 tablet by mouth 2 (two) times daily.    . carvedilol (COREG) 6.25 MG tablet Take 1 tablet (6.25 mg total) by mouth 2 (two) times daily with a meal. 180 tablet 1  . digoxin (LANOXIN) 0.125 MG tablet Take 0.125 mg by mouth daily.     [name] esomeprazole (NEXIUM) 40 MG capsule Take 40 mg by mouth daily as needed (for acid reflux).     . fluticasone-salmeterol (ADVAIR HFA) 115-21 MCG/ACT inhaler Inhale 1 puff into the lungs 2 (two) times daily.    .  furosemide (LASIX) 40 MG tablet Take 20-40 mg by mouth 2 (two) times daily. Take 40 mg by mouth in the morning and take 20 mg by mouth in the evening for fluid.    [name] glimepiride (AMARYL) 1 MG tablet Take 1 mg by mouth daily with breakfast.    . Ipratropium-Albuterol (COMBIVENT) 20-100 MCG/ACT AERS respimat Inhale 1 puff into the lungs every 6 (six) hours as needed for wheezing or shortness of breath.     [name] ipratropium-albuterol (DUONEB) 0.5-2.5 (3) MG/3ML SOLN Take 3 mLs by nebulization every 6 (six) hours as needed (for shortness of breath).     [name] levothyroxine (SYNTHROID, LEVOTHROID) 100 MCG tablet Take 100 mcg by mouth daily before breakfast.     . potassium chloride SA (K-DUR,KLOR-CON) 20 MEQ tablet Take 20 mEq by mouth daily.     [name] rOPINIRole (REQUIP) 4 MG tablet Take 4 mg by mouth at bedtime.    . vitamin C (ASCORBIC ACID) 500 MG tablet Take 500 mg by mouth daily.     No current facility-administered medications for this visit.    Allergies  Allergen Reactions  . Coumadin [Warfarin Sodium] Other (See Comments)    Reaction: Bleeding from ears  . Prozac [Fluoxetine Hcl] Rash    Review of Systems  Constitutional: Positive for activity change and fatigue. Negative for fever, chills and unexpected weight change.  HENT: Positive for hearing loss. Negative for congestion.   Respiratory: Positive for cough, shortness of breath and wheezing.        Home O2, 2 L nasal cannula  Cardiovascular: Positive for palpitations and leg swelling. Negative for chest pain.       Varicose veins  Gastrointestinal:       Reflux  Musculoskeletal: Positive for joint swelling and arthralgias.  Hematological: Positive for adenopathy. Bruises/bleeds easily.    BP 127/57 mmHg  Pulse 66  Resp 20  Ht '5\' 1"'$  (1.549 m)  Wt 145 lb (65.772 kg)  BMI 27.41 kg/m2  SpO2 95% Physical Exam  Constitutional: She is oriented to person, place, and time.  Elderly woman appears chronically ill, in no acute distress   HENT:  Head: Normocephalic and atraumatic.  Mouth/Throat: No oropharyngeal exudate.  Eyes: Conjunctivae and EOM are normal. No scleral icterus.  Neck: Neck supple. No tracheal deviation present. No thyromegaly present.  Cardiovascular:  Murmur (2 to 3/6 systolic) heard. Irregularly irregular  Pulmonary/Chest:  Bronchial breath sounds right apex, no wheezing  Abdominal: Soft. She exhibits no distension. There is no tenderness.  Musculoskeletal: She exhibits edema.  Lymphadenopathy:  She has no cervical adenopathy.  Neurological: She is alert and oriented to person, place, and time. No cranial nerve deficit.  Heart appearing. Moves slowly, no focal deficit  Skin: Skin is warm and dry.  Vitals reviewed.    Diagnostic Tests: NUCLEAR MEDICINE PET SKULL BASE TO THIGH  TECHNIQUE: 7.5 mCi F-18 FDG was injected intravenously. Full-ring PET imaging was performed from the skull base to thigh after the radiotracer. CT data was obtained and used for attenuation correction and anatomic localization.  FASTING BLOOD GLUCOSE: Value: 183 mg/dl  COMPARISON: PET-CT 12/16/2013. Chest CT 10/13/2014.  FINDINGS: NECK  No hypermetabolic cervical lymph nodes are identified.There are no lesions of the pharyngeal mucosal space. There is stable homogeneous thyroid activity. Intracranial atrophy noted.  CHEST  The enlarging mediastinal lymph nodes demonstrated on recent CT are hypermetabolic. Low right paratracheal node measuring approximately 14 mm short axis on image 61 has an SUV max of 6.2. 1.3 cm subcarinal node has an SUV max of 7.2. 11 mm node anterior to the right hilum has an SUV max of 7.6. There is low-level metabolic activity throughout the right upper lobe consolidation, most likely representing radiation pneumonitis. However, along the posterior margin of this process, there is residual focal hypermetabolic activity with an SUV max of 12.0. This corresponds with the  original lesion and appears slightly larger than on prior PET-CT, measuring up to 2.7 cm on image 20. No other suspicious pulmonary activity. There is linear left lower lobe scarring or atelectasis. Diffuse atherosclerosis noted status post median sternotomy, CABG and valve replacement. There are trace pleural effusions.  ABDOMEN/PELVIS  There is no hypermetabolic activity within the liver, adrenal glands, spleen or pancreas. There is no hypermetabolic nodal activity. Urine contamination noted in the perineal region and anal crease. Hepatic steatosis, right renal cyst, atherosclerosis and previous pelvic lymph node dissection noted.  SKELETON  There is no hypermetabolic activity to suggest osseous metastatic disease.  IMPRESSION: 1. Enlarging focus of hypermetabolism at site of original right upper lobe lesion consistent with recurrent disease. Surrounding pulmonary opacities demonstrate only low level metabolic activity, most consistent with radiation pneumonitis. 2. New right hilar and mediastinal nodal metastases. 3. No distant metastases identified.   Electronically Signed  By: Richardean Sale M.D.  On: 11/10/2014 14:49  Impression: 81 year old woman with multiple medical problems including tobacco abuse, COPD, and a stage IA non-small cell lung cancer treated with stereotactic radiation about a year ago. She recently had a CT of the chest showing radiation changes but also the suggestion of a mass lesion in the right upper lobe. There was also hilar and mediastinal adenopathy. On PET CT there is an enlarging focus of hypermetabolism at the site of the original lesion with surrounding radiation change. The hilar and mediastinal nodes were hypermetabolic as well. This is highly suspicious for recurrent lung cancer, but she needs a biopsy to confirm the diagnosis in order to guide therapy.  I recommended to her that we proceed with electromagnetic navigational  bronchoscopy and endobronchial ultrasound. This will allow [place] to sample both the right upper lobe lesion as well as the mediastinal and hilar lymph nodes. She is familiar with the procedure as she had it done about a year ago. I again reviewed the general nature of the procedure, the need for general anesthesia, and the outpatient nature of the procedure. She and her daughter do understand that there is no guarantee that definitive diagnosis will be made. I reviewed the indications, risks, benefits, and alternatives.  They understand the risk include those associated with general anesthesia(and death, MI, stroke, DVT, PE), bleeding, pneumothorax, failure to make a diagnosis, as well as the possibility of other unforeseeable complications.  She accepts the risks and agrees to proceed  Plan:  Electromagnetic navigational bronchoscopy and endobronchial ultrasound on Friday, 11/21/2014  [name], MD [facility] [phone]

## 2014-11-21 NOTE — Brief Op Note (Addendum)
11/21/2014  9:48 AM  PATIENT:  Marguarite Arbour  81 y.o. female  PRE-OPERATIVE DIAGNOSIS:    RIGHT LUNG MASS  MEDIASTINAL ADENOPATHY  POST-OPERATIVE DIAGNOSIS:  Recurrent non-small cell carcinoma  RIGHT LUNG MASS  MEDIASTINAL ADENOPATHY  PROCEDURE:   Electromagnetic navigational bronchoscopy with needle aspirations, brushings and biopsies Endobronchial ultrasound  SURGEON:  Surgeon(s) and Role:    * [name], MD - Primary   ANESTHESIA:   general  EBL:     BLOOD ADMINISTERED:none  DRAINS: none   LOCAL MEDICATIONS USED:  NONE  SPECIMEN:  Source of Specimen:  Level 7 and 4R lymph nodes, RUL mass  DISPOSITION OF SPECIMEN:  PATHOLOGY  PLAN OF CARE: Discharge to home after PACU  PATIENT DISPOSITION:  PACU - hemodynamically stable.   Delay start of Pharmacological VTE agent (>24hrs) due to surgical blood loss or risk of bleeding: not applicable

## 2014-11-21 NOTE — Transfer of Care (Signed)
Immediate Anesthesia Transfer of Care Note  Patient: Marissa [name]  Procedure(s) Performed: Procedure(s): VIDEO BRONCHOSCOPY WITH ENDOBRONCHIAL NAVIGATION (N/A) VIDEO BRONCHOSCOPY WITH ENDOBRONCHIAL ULTRASOUND (N/A)  Patient Location: PACU  Anesthesia Type:General  Level of Consciousness: awake, alert  and oriented  Airway & Oxygen Therapy: Patient Spontanous Breathing and Patient connected to face mask oxygen  Post-op Assessment: Report given to RN, Post -op Vital signs reviewed and stable and Patient moving all extremities X 4  Post vital signs: Reviewed and stable  Last Vitals:  Filed Vitals:   11/21/14 0603  BP: 151/62  Pulse: 66  Temp: 36.8 C  Resp: 18    Complications: No apparent anesthesia complications

## 2014-11-25 ENCOUNTER — Encounter (HOSPITAL_COMMUNITY): Payer: Self-pay | Admitting: Thoracic Surgery (Cardiothoracic Vascular Surgery)

## 2014-11-26 ENCOUNTER — Telehealth: Payer: Self-pay | Admitting: *Deleted

## 2014-11-26 NOTE — Telephone Encounter (Signed)
Oncology Nurse Navigator Documentation  Oncology Nurse Navigator Flowsheets 11/26/2014  Navigator Encounter Type Telephone/called to schedule with Dr. Julien Nordmann at thoracic clinic on 12/04/14.  Daughter verbalized understanding of appt time and place.   Interventions Coordination of Care  Coordination of Care MD Appointments  Time Spent with Patient 15

## 2014-11-27 ENCOUNTER — Encounter: Payer: Self-pay | Admitting: Radiation Oncology

## 2014-11-27 ENCOUNTER — Ambulatory Visit
Admission: RE | Admit: 2014-11-27 | Discharge: 2014-11-27 | Disposition: A | Discharge status: Home / Self Care | Payer: Medicare Other | Source: Ambulatory Visit | Attending: Radiation Oncology | Admitting: Radiation Oncology

## 2014-11-27 ENCOUNTER — Ambulatory Visit: Payer: Medicare Other | Admitting: Radiation Oncology

## 2014-11-27 VITALS — BP 137/45 | HR 58 | Resp 18 | Wt 141.1 lb

## 2014-11-27 DIAGNOSIS — C3411 Malignant neoplasm of upper lobe, right bronchus or lung: Secondary | ICD-10-CM

## 2014-11-27 NOTE — Progress Notes (Signed)
Radiation Oncology         [phone] ________________________________  Name: Marissa [name] MRN: [id number]  Date: 11/27/2014  DOB: November 02, 1933  Follow-Up Visit Note  CC: Marissa Huh, MD  Marissa [name], *  Diagnosis:   81 yo woman with recurrent stage IIIA squamous cell lung cancer s/p previous SBRT for stage I in the right upper lobe    ICD-9-CM ICD-10-CM   1. Primary cancer of right upper lobe of lung 162.3 C34.11     Interval Since Last Radiation:  10  months  Narrative:  The patient returns today for routine follow-up.  Denies pain. Reports a productive cough with thick yellow sputum. Reports occasional scant hemoptysis. Reports SOB at rest. Oxygen therapy 2 liters via nasal cannula. Reports fatigue and weakness. Four pound weight loss noted within six days. Reports she is not sleeping due to cough  She had CT and PET suggesting new mediastinal adenopathy and biopsy shows squamous carcinoma.           ALLERGIES:  is allergic to coumadin and prozac.  Meds: Current Outpatient Prescriptions  Medication Sig Dispense Refill  . albuterol (PROVENTIL) (2.5 MG/3ML) 0.083% nebulizer solution     . alendronate (FOSAMAX) 70 MG tablet     . aspirin EC 81 MG tablet Take 1 tablet (81 mg total) by mouth daily.    [name] atorvastatin (LIPITOR) 40 MG tablet Take 40 mg by mouth daily.     [name] CALCIUM CARBONATE PO Take 1 tablet by mouth 2 (two) times daily.    . carvedilol (COREG) 6.25 MG tablet Take 1 tablet (6.25 mg total) by mouth 2 (two) times daily with a meal. 180 tablet 1  . digoxin (LANOXIN) 0.125 MG tablet Take 0.125 mg by mouth daily.     [name] esomeprazole (NEXIUM) 40 MG capsule Take 40 mg by mouth daily as needed (for acid reflux).     . fluticasone-salmeterol (ADVAIR HFA) 115-21 MCG/ACT inhaler Inhale 2 puffs into the lungs 2 (two) times daily.     . furosemide (LASIX) 40 MG tablet Take 20-40 mg by mouth 2 (two) times daily. Take 40 mg by mouth in the morning and take 20 mg  by mouth in the evening for fluid.    [name] glimepiride (AMARYL) 1 MG tablet Take 1 mg by mouth daily with breakfast.    . Ipratropium-Albuterol (COMBIVENT) 20-100 MCG/ACT AERS respimat Inhale 1 puff into the lungs every 6 (six) hours as needed for wheezing or shortness of breath.     [name] ipratropium-albuterol (DUONEB) 0.5-2.5 (3) MG/3ML SOLN Take 3 mLs by nebulization every 6 (six) hours as needed (for shortness of breath).     [name] levothyroxine (SYNTHROID, LEVOTHROID) 100 MCG tablet Take 100 mcg by mouth daily before breakfast.     . potassium chloride SA (K-DUR,KLOR-CON) 20 MEQ tablet Take 20 mEq by mouth daily.     [name] rOPINIRole (REQUIP) 4 MG tablet Take 4 mg by mouth at bedtime.    . vitamin C (ASCORBIC ACID) 500 MG tablet Take 500 mg by mouth daily.     No current facility-administered medications for this encounter.    Physical Findings: The patient is in no acute distress. Patient is alert and oriented.  weight is 141 lb 1.6 oz (64.003 kg). Her blood pressure is 137/45 and her pulse is 58. Her respiration is 18 and oxygen saturation is 100%. .  No significant changes.  Lab Findings: Lab Results  Component Value Date  WBC 10.3 11/21/2014   WBC 15.3* 04/17/2014   HGB 12.5 11/21/2014   HGB 13.5 04/17/2014   HCT 39.5 11/21/2014   HCT 40.6 04/17/2014   PLT 201 11/21/2014   PLT 168 04/17/2014    Lab Results  Component Value Date   NA 139 11/21/2014   NA 137 04/17/2014   K 3.8 11/21/2014   K 2.8* 04/18/2014   CO2 32 11/21/2014   CO2 30 04/17/2014   GLUCOSE 155* 11/21/2014   GLUCOSE 244* 04/17/2014   BUN 21* 11/21/2014   BUN 19* 04/17/2014   CREATININE 0.88 11/21/2014   CREATININE 1.08 04/17/2014   BILITOT 0.8 11/21/2014   ALKPHOS 80 11/21/2014   AST 22 11/21/2014   ALT 20 11/21/2014   PROT 6.6 11/21/2014   ALBUMIN 3.4* 11/21/2014   CALCIUM 9.3 11/21/2014   CALCIUM 9.0 04/17/2014   ANIONGAP 7 11/21/2014   ANIONGAP 9 04/17/2014    Radiographic Findings: Dg Chest 2  View  11/21/2014   CLINICAL DATA:  Preop floor endobronchial navigation and video bronchoscopy.  EXAM: CHEST  2 VIEW  COMPARISON:  CT 11/20/2014 .  FINDINGS: The right upper lobe mass is again evident, as well as a prominent right hilar contours, unchanged from 11/20/2014. Mild diffuse interstitial reticular nodular coarsening persists throughout the right lung. No effusions are evident. There is no pneumothorax. There is prior sternotomy and cardiac valvuloplasty. There is moderate cardiomegaly.  IMPRESSION: Right upper lobe mass and probable right hilar adenopathy. Diffuse interstitial coarsening in the right lung. Moderate cardiomegaly. Unchanged from 11/20/2014.   Electronically Signed   By: Andreas Newport M.D.   On: 11/21/2014 06:40   Ct Chest Wo Contrast  11/20/2014   CLINICAL DATA:  Recurrent lung cancer.  Cough, short of breath.  EXAM: CT CHEST WITHOUT CONTRAST  TECHNIQUE: Multidetector CT imaging of the chest was performed following the standard protocol without IV contrast.  COMPARISON:  FDG PET scan 11/08/2014  FINDINGS: Mediastinum/Nodes: No axillary or supraclavicular adenopathy. Mediastinal lymph nodes are difficult to define [place] noncontrast exam. There is an enlarged lymph node adjacent to the distal aspect of the superior vena cava along its RIGHT margin measuring 10 mm (image 29 series 3). This was hypermetabolic on comparison PET-CT scan. No pericardial fluid.  Lungs/Pleura: The oblong mass in the RIGHT upper lobe which was hypermetabolic on comparison PET-CT scan measures 3.3 x 2.0 cm compared to 2.7 by 1.6 cm for interval increase in size.  There is a postobstructive pneumonitis and presumable radiation change in the RIGHT upper lobe and field of consolidation which is not significantly changed on image 16, series 4.  There is atelectasis at the LEFT and RIGHT lung base unchanged from prior.  Upper abdomen: Limited view of the liver, kidneys, pancreas are unremarkable. Normal adrenal glands.   Musculoskeletal: No aggressive osseous lesion.  IMPRESSION: 1. Continued enlargement of RIGHT upper lobe nodule / mass concerning for recurrent lung cancer on comparison PET-CT scan. 2. Stable consolidation in the RIGHT upper lobe at site of prior radiation treatment and surgery. 3. Stable mediastinal lymph nodes which are difficult to assess on this noncontrast exam.   Electronically Signed   By: Suzy Bouchard M.D.   On: 11/20/2014 14:51   Nm Pet Image Restag (ps) Skull Base To Thigh  11/10/2014   CLINICAL DATA:  Subsequent treatment strategy for right upper lobe lung cancer post SBRT.  EXAM: NUCLEAR MEDICINE PET SKULL BASE TO THIGH  TECHNIQUE: 7.5 mCi F-18 FDG was injected  intravenously. Full-ring PET imaging was performed from the skull base to thigh after the radiotracer. CT data was obtained and used for attenuation correction and anatomic localization.  FASTING BLOOD GLUCOSE:  Value: 183 mg/dl  COMPARISON:  PET-CT 12/16/2013.  Chest CT 10/13/2014.  FINDINGS: NECK  No hypermetabolic cervical lymph nodes are identified.There are no lesions of the pharyngeal mucosal space. There is stable homogeneous thyroid activity. Intracranial atrophy noted.  CHEST  The enlarging mediastinal lymph nodes demonstrated on recent CT are hypermetabolic. Low right paratracheal node measuring approximately 14 mm short axis on image 61 has an SUV max of 6.2. 1.3 cm subcarinal node has an SUV max of 7.2. 11 mm node anterior to the right hilum has an SUV max of 7.6. There is low-level metabolic activity throughout the right upper lobe consolidation, most likely representing radiation pneumonitis. However, along the posterior margin of this process, there is residual focal hypermetabolic activity with an SUV max of 12.0. This corresponds with the original lesion and appears slightly larger than on prior PET-CT, measuring up to 2.7 cm on image 20. No other suspicious pulmonary activity. There is linear left lower lobe scarring or  atelectasis. Diffuse atherosclerosis noted status post median sternotomy, CABG and valve replacement. There are trace pleural effusions.  ABDOMEN/PELVIS  There is no hypermetabolic activity within the liver, adrenal glands, spleen or pancreas. There is no hypermetabolic nodal activity. Urine contamination noted in the perineal region and anal crease. Hepatic steatosis, right renal cyst, atherosclerosis and previous pelvic lymph node dissection noted.  SKELETON  There is no hypermetabolic activity to suggest osseous metastatic disease.  IMPRESSION: 1. Enlarging focus of hypermetabolism at site of original right upper lobe lesion consistent with recurrent disease. Surrounding pulmonary opacities demonstrate only low level metabolic activity, most consistent with radiation pneumonitis. 2. New right hilar and mediastinal nodal metastases. 3. No distant metastases identified.   Electronically Signed   By: Richardean Sale M.D.   On: 11/10/2014 14:49   Dg Chest Port 1 View  11/21/2014   CLINICAL DATA:  81 year old female status post bronchoscopy. Right upper lobe mass. Initial encounter.  EXAM: PORTABLE CHEST - 1 VIEW  COMPARISON:  0516 hours today, and earlier.  FINDINGS: Interval increased right upper lobe opacity since 0516 hours. No pneumothorax. Trace fluid now along the minor fissure which is seen to be elevated. Stable bilateral lung markings elsewhere. Stable cardiac size and mediastinal contours. Sequelae of CABG and cardiac valve replacement.  IMPRESSION: Status post bronchoscopy with no pneumothorax. Acutely increased right upper lobe opacity probably related to BAL.   Electronically Signed   By: Genevie Ann M.D.   On: 11/21/2014 10:30   Dg C-arm Bronchoscopy  11/21/2014   CLINICAL DATA:    C-ARM BRONCHOSCOPY  Fluoroscopy was utilized by the requesting physician.  No radiographic  interpretation.     Impression:  The patient has recurrent lung cancer in the right upper lung previously treated with SBRT, now  with local and mediastinal nodal recurrence.  She is not a surgical candidate, and may be eligible for salvage chemotherapy, radiotherapy, or both.  Plan:  Today, I talked to the patient and family about the findings and work-up thus far.  We discussed the natural history of recurrent and general treatment, highlighting the role of re-irradiation in the management.  We discussed the available radiation techniques, and focused on the details of logistics and delivery.  We reviewed the anticipated acute and late sequelae associated with radiation in this setting.  The patient was encouraged to ask questions that I answered to the best of my ability.  I filled out a patient counseling form during our discussion including treatment diagrams.  We retained a copy for our records.  The patient would like to proceed with radiation and will be scheduled for CT simulation tomorrow.  She will see Dr. Julien Nordmann next week for possible concurrent chemotherapy if she is eligible.  I spent 60 minutes minutes face to face with the patient and more than 50% of that time was spent in counseling and/or coordination of care.   _____________________________________  Sheral Apley. Tammi Klippel, M.D.

## 2014-11-27 NOTE — Progress Notes (Signed)
Weight and vitals stable. Denies pain. Reports a productive cough with thick yellow sputum. Reports occasional scant hemoptysis. Reports SOB at rest. Oxygen therapy 2 liters via nasal cannula. Reports fatigue and weakness. Four pound weight loss noted within six days. Reports she is not sleeping due to cough. SIM scheduled for 0900 tomorrow.   BP 137/45 mmHg  Pulse 58  Resp 18  Wt 141 lb 1.6 oz (64.003 kg)  SpO2 100% Wt Readings from Last 3 Encounters:  11/27/14 141 lb 1.6 oz (64.003 kg)  11/21/14 145 lb (65.772 kg)  11/19/14 145 lb (65.772 kg)

## 2014-11-28 ENCOUNTER — Encounter: Payer: Self-pay | Admitting: Radiation Oncology

## 2014-11-28 ENCOUNTER — Ambulatory Visit
Admission: RE | Admit: 2014-11-28 | Discharge: 2014-11-28 | Disposition: A | Discharge status: Home / Self Care | Payer: Medicare Other | Source: Ambulatory Visit | Attending: Radiation Oncology | Admitting: Radiation Oncology

## 2014-11-28 VITALS — BP 130/36 | HR 51 | Temp 98.3°F | Resp 20

## 2014-11-28 DIAGNOSIS — R0602 Shortness of breath: Secondary | ICD-10-CM | POA: Insufficient documentation

## 2014-11-28 DIAGNOSIS — R2681 Unsteadiness on feet: Secondary | ICD-10-CM | POA: Diagnosis not present

## 2014-11-28 DIAGNOSIS — C3411 Malignant neoplasm of upper lobe, right bronchus or lung: Secondary | ICD-10-CM | POA: Diagnosis present

## 2014-11-28 NOTE — Progress Notes (Signed)
Called to simulation due to frequent coughing during the simulation procedure as reported by the radiation therapist.  O2 at 2 liters/min.  O2 sat 97%.  Up to wheelchair.  Accompanied by family.

## 2014-11-28 NOTE — Progress Notes (Signed)
  Radiation Oncology         ([phone]) [phone] ________________________________  Name: Marissa [name] MRN: [id number]  Date: 11/28/2014  DOB: Nov 19, 1933  SIMULATION AND TREATMENT PLANNING NOTE  No diagnosis found.  DIAGNOSIS:  81 yo woman with recurrent stage IIIA squamous cell lung cancer s/p previous SBRT for stage I in the right upper lobe    ICD-9-CM ICD-10-CM   1. Primary cancer of right upper lobe of lung 162.3 C34.11     NARRATIVE:  The patient was brought to the [facility].  Identity was confirmed.  All relevant records and images related to the planned course of therapy were reviewed.  The patient freely provided informed written consent to proceed with treatment after reviewing the details related to the planned course of therapy. The consent form was witnessed and verified by the simulation staff.  Then, the patient was set-up in a stable reproducible  supine position for radiation therapy.  CT images were obtained.  Surface markings were placed.  The CT images were loaded into the planning software.  Then the target and avoidance structures were contoured.  Treatment planning then occurred.  The radiation prescription was entered and confirmed.  Then, I designed and supervised the construction of a total of 6 medically necessary complex treatment devices, including a BodyFix immobilization mold custom fitted to the patient along with 5 multileaf collimators conformally shaped radiation around the treatment target while shielding critical structures such as the heart and spinal cord maximally.  I have requested : 3D Simulation  I have requested a DVH of the following structures: Left lung, right lung, spinal cord, heart, esophagus, and target.  I have ordered:Nutrition Consult  SPECIAL TREATMENT PROCEDURE:  The planned course of therapy using radiation constitutes a special treatment procedure. Special care is required in the management of this patient for the following  reasons. This treatment constitutes a Special Treatment Procedure for the following reason:  Retreatment in a previously radiated area requiring careful monitoring of increased risk of toxicity due to overlap of previous treatment..  The special nature of the planned course of radiotherapy will require increased physician supervision and oversight to ensure patient's safety with optimal treatment outcomes.  PLAN:  The patient will receive 66 Gy in 33 fractions.  This document serves as a record of services personally performed by Tyler Pita, MD. It was created on his behalf by Arlyce Harman, a trained medical scribe. The creation of this record is based on the scribe's personal observations and the provider's statements to them. This document has been checked and approved by the attending provider.    ________________________________  Sheral Apley. Tammi Klippel, M.D.

## 2014-12-01 DIAGNOSIS — E119 Type 2 diabetes mellitus without complications: Secondary | ICD-10-CM | POA: Diagnosis not present

## 2014-12-01 DIAGNOSIS — I2510 Atherosclerotic heart disease of native coronary artery without angina pectoris: Secondary | ICD-10-CM | POA: Diagnosis not present

## 2014-12-01 DIAGNOSIS — I509 Heart failure, unspecified: Secondary | ICD-10-CM | POA: Diagnosis not present

## 2014-12-01 DIAGNOSIS — I059 Rheumatic mitral valve disease, unspecified: Secondary | ICD-10-CM | POA: Diagnosis not present

## 2014-12-01 DIAGNOSIS — I110 Hypertensive heart disease with heart failure: Secondary | ICD-10-CM | POA: Diagnosis not present

## 2014-12-01 DIAGNOSIS — I359 Nonrheumatic aortic valve disorder, unspecified: Secondary | ICD-10-CM | POA: Diagnosis not present

## 2014-12-01 DIAGNOSIS — E780 Pure hypercholesterolemia: Secondary | ICD-10-CM | POA: Diagnosis not present

## 2014-12-01 DIAGNOSIS — G2581 Restless legs syndrome: Secondary | ICD-10-CM | POA: Diagnosis not present

## 2014-12-01 DIAGNOSIS — E039 Hypothyroidism, unspecified: Secondary | ICD-10-CM | POA: Diagnosis not present

## 2014-12-03 ENCOUNTER — Other Ambulatory Visit: Payer: Self-pay | Admitting: Medical Oncology

## 2014-12-03 ENCOUNTER — Telehealth: Payer: Self-pay | Admitting: *Deleted

## 2014-12-03 DIAGNOSIS — C3411 Malignant neoplasm of upper lobe, right bronchus or lung: Secondary | ICD-10-CM

## 2014-12-03 NOTE — Telephone Encounter (Signed)
Called and spoke w/ pt's daughter and confirmed 12/04/14 clinic appt w/ her.  Gave directions and instructions for visit.

## 2014-12-04 ENCOUNTER — Encounter: Payer: Self-pay | Admitting: *Deleted

## 2014-12-04 ENCOUNTER — Other Ambulatory Visit (HOSPITAL_BASED_OUTPATIENT_CLINIC_OR_DEPARTMENT_OTHER): Payer: Medicare Other

## 2014-12-04 ENCOUNTER — Ambulatory Visit: Payer: Medicare Other | Admitting: Physical Therapy

## 2014-12-04 ENCOUNTER — Telehealth: Payer: Self-pay | Admitting: Internal Medicine

## 2014-12-04 ENCOUNTER — Encounter: Payer: Self-pay | Admitting: Internal Medicine

## 2014-12-04 ENCOUNTER — Ambulatory Visit (HOSPITAL_BASED_OUTPATIENT_CLINIC_OR_DEPARTMENT_OTHER): Payer: Medicare Other | Admitting: Internal Medicine

## 2014-12-04 ENCOUNTER — Telehealth: Payer: Self-pay | Admitting: *Deleted

## 2014-12-04 VITALS — BP 137/65 | HR 60 | Temp 99.0°F | Resp 18 | Ht 61.0 in | Wt 141.4 lb

## 2014-12-04 DIAGNOSIS — R2689 Other abnormalities of gait and mobility: Secondary | ICD-10-CM

## 2014-12-04 DIAGNOSIS — C3411 Malignant neoplasm of upper lobe, right bronchus or lung: Secondary | ICD-10-CM | POA: Diagnosis not present

## 2014-12-04 DIAGNOSIS — R2681 Unsteadiness on feet: Secondary | ICD-10-CM | POA: Diagnosis not present

## 2014-12-04 DIAGNOSIS — R5381 Other malaise: Secondary | ICD-10-CM

## 2014-12-04 DIAGNOSIS — R0602 Shortness of breath: Secondary | ICD-10-CM

## 2014-12-04 LAB — COMPREHENSIVE METABOLIC PANEL (CC13)
ALT: 14 U/L (ref 0–55)
ANION GAP: 11 meq/L (ref 3–11)
AST: 15 U/L (ref 5–34)
Albumin: 3.5 g/dL (ref 3.5–5.0)
Alkaline Phosphatase: 92 U/L (ref 40–150)
BILIRUBIN TOTAL: 0.96 mg/dL (ref 0.20–1.20)
BUN: 19.6 mg/dL (ref 7.0–26.0)
CALCIUM: 9.4 mg/dL (ref 8.4–10.4)
CHLORIDE: 99 meq/L (ref 98–109)
CO2: 32 mEq/L — ABNORMAL HIGH (ref 22–29)
CREATININE: 0.9 mg/dL (ref 0.6–1.1)
EGFR: 61 mL/min/{1.73_m2} — AB (ref >90)
Glucose: 148 mg/dl — ABNORMAL HIGH (ref 70–140)
Potassium: 3.3 mEq/L — ABNORMAL LOW (ref 3.5–5.1)
Sodium: 141 mEq/L (ref 136–145)
Total Protein: 7.1 g/dL (ref 6.4–8.3)

## 2014-12-04 LAB — CBC WITH DIFFERENTIAL/PLATELET
BASO%: 0.6 % (ref 0.0–2.0)
BASOS ABS: 0.1 10*3/uL (ref 0.0–0.1)
EOS ABS: 0.1 10*3/uL (ref 0.0–0.5)
EOS%: 1 % (ref 0.0–7.0)
HEMATOCRIT: 38.8 % (ref 34.8–46.6)
HGB: 12.7 g/dL (ref 11.6–15.9)
LYMPH#: 1.5 10*3/uL (ref 0.9–3.3)
LYMPH%: 13.7 % — AB (ref 14.0–49.7)
MCH: 27.1 pg (ref 25.1–34.0)
MCHC: 32.6 g/dL (ref 31.5–36.0)
MCV: 83.2 fL (ref 79.5–101.0)
MONO#: 0.7 10*3/uL (ref 0.1–0.9)
MONO%: 6.6 % (ref 0.0–14.0)
NEUT#: 8.7 10*3/uL — ABNORMAL HIGH (ref 1.5–6.5)
NEUT%: 78.1 % — AB (ref 38.4–76.8)
PLATELETS: 248 10*3/uL (ref 145–400)
RBC: 4.66 10*6/uL (ref 3.70–5.45)
RDW: 16.2 % — ABNORMAL HIGH (ref 11.2–14.5)
WBC: 11.2 10*3/uL — ABNORMAL HIGH (ref 3.9–10.3)

## 2014-12-04 MED ORDER — PROCHLORPERAZINE MALEATE 10 MG PO TABS: 10.0000 mg | ORAL_TABLET | Freq: Four times a day (QID) | ORAL | Status: DC | PRN

## 2014-12-04 NOTE — Telephone Encounter (Signed)
Per staff message and POF I have scheduled appts. Advised scheduler of appts and to move labs. Also to call radiation to adjust 10/31 JMW

## 2014-12-04 NOTE — Progress Notes (Signed)
I received notification from pathology dept PDL 1 Kaytruda test will be sent out today.

## 2014-12-04 NOTE — Therapy (Signed)
[place], [place], [address] Phone: [phone]   Fax:  [phone]  Physical Therapy Evaluation  Patient Details  Name: Marissa [name] MRN: [id number] Date of Birth: 31-May-1933 Referring Provider:  Curt Bears, MD  Encounter Date: 12/04/2014      PT End of Session - 12/04/14 1520    Visit Number 1   Number of Visits 1   PT Start Time 1435   PT Stop Time 1500   PT Time Calculation (min) 25 min   Activity Tolerance Other (comment)  limited by emphysema   Behavior During Therapy [facility] for tasks assessed/performed      Past Medical History  Diagnosis Date  . HTN (hypertension)   . Hypercholesteremia   . CAD (coronary artery disease)     s/p CABG 03/2011  . Aortic valve disorders   . RLS (restless legs syndrome)   . A-fib   . Osteopenia   . Mitral valve disease     s/p MVR 03/2011 w/ 27 mm porcine valve  . Hypothyroidism   . COPD (chronic obstructive pulmonary disease)   . CHF (congestive heart failure)   . Aortic stenosis     (s/p AVR w/ 21 mm porcine valve 03/2011  . Mastoiditis   . Myocardial infarction     "slight"  . Dysrhythmia   . Shortness of breath     with exertion  . Pre-diabetes   . GERD (gastroesophageal reflux disease)     takes nexium prn  . Arthritis   . History of blood transfusion     Rheumatic Fever  . Lung cancer   . Pneumonia   . Diabetes mellitus without complication     Past Surgical History  Procedure Laterality Date  . Mitral valve replacement      1990's  . Laparoscopic cholecystectomy    . Aortic valve replacement    . Tonsillectomy    . Coronary artery bypass graft  03/2011    1990's also  . Video bronchoscopy with endobronchial navigation N/A 01/06/2014    Procedure: VIDEO BRONCHOSCOPY WITH ENDOBRONCHIAL NAVIGATION;  Surgeon: [name], MD;  Location: [place];  Service: Thoracic;  Laterality: N/A;  . Video bronchoscopy with  endobronchial ultrasound N/A 01/06/2014    Procedure: VIDEO BRONCHOSCOPY WITH ENDOBRONCHIAL ULTRASOUND;  Surgeon: [name], MD;  Location: [place];  Service: Thoracic;  Laterality: N/A;  . Video bronchoscopy with endobronchial navigation N/A 11/21/2014    Procedure: VIDEO BRONCHOSCOPY WITH ENDOBRONCHIAL NAVIGATION;  Surgeon: [name], MD;  Location: [place];  Service: Thoracic;  Laterality: N/A;  . Video bronchoscopy with endobronchial ultrasound N/A 11/21/2014    Procedure: VIDEO BRONCHOSCOPY WITH ENDOBRONCHIAL ULTRASOUND;  Surgeon: [name], MD;  Location: [place];  Service: Thoracic;  Laterality: N/A;    There were no vitals filed for this visit.  Visit Diagnosis:  Unstable balance - Plan: PT plan of care cert/re-cert  Physical deconditioning - Plan: PT plan of care cert/re-cert  Shortness of breath on exertion - Plan: PT plan of care cert/re-cert      Subjective Assessment - 12/04/14 1507    Subjective talks about not being able to walk as fast as she used to   Patient is accompained by: Family member  daughter who lives with her   Pertinent History Pt. was being followed due to h/o non-small cell lung cancer (stage IA treated with SBRT in 2015); latest chest CT showed new right lung  consolidation.  Diagnosed with right upper lobe mass and positive nodes indicating a recurrence of squamous cell CA.  Pt. is expected to have chemoradiation.  Ex-smoker (15 packy-years);CHF; emphysema; atrial fibrillation; CABG with aortic and mitral valve replacements.  On O2 full time.                                                    Patient Stated Goals get information from lung clinic providers   Currently in Pain? No/denies            [facility] PT Assessment - 12/04/14 0001    Assessment   Medical Diagnosis recurrence of squamous cell lung CA with positive nodes   Precautions   Precautions Fall;Other (comment)   Precaution Comments on O2; cancer precautions    Restrictions   Weight Bearing Restrictions No   Balance Screen   Has the patient fallen in the past 6 months No   Has the patient had a decrease in activity level because of a fear of falling?  No   Is the patient reluctant to leave their home because of a fear of falling?  No   Home Environment   Living Environment Private residence   Living Arrangements Children  daughter   Type of Home Other(Comment)  duplex   Home Access Stairs to enter  needs to hold onto someone or [place] One level   Prior Function   Level of [place] device for independence;Needs assistance with gait  O2   Leisure no current exercise   Cognition   Overall Cognitive Status Difficult to assess   Difficult to assess due to --  short time for evaluation; repeated some info more than once   Observation/Other Assessments   Observations frail looking older woman sitting in wheelchair with O2 through nasal canula   Posture/Postural Control   Posture/Postural Control Postural limitations   Postural Limitations Forward head   ROM / Strength   AROM / PROM / Strength AROM   AROM   Overall AROM Comments not assessed; patient appeared fatigued after walking to restroom   Ambulation/Gait   Ambulation/Gait Yes   Ambulation/Gait Assistance 5: Supervision   Ambulation/Gait Assistance Details pushed her oxygen tank on wheels but appeared unsteady and had several near losses of balance; quick steps with wide base of support   Ambulation Distance (Feet) 60 Feet  x 2; became short of breath; "hard" SOB, per pt.   Balance   Balance Assessed Yes   Dynamic Standing Balance   Dynamic Standing - Comments during gait on level surface, had several near losses of balance requiring supervision when walked approx. 60 feet x 2                           PT Education - 12/04/14 1519    Education provided Yes   Education Details energy conservation, walking/staying active  during treatment; posture; breathing; PT info   Person(s) Educated Patient;Child(ren)   Methods Explanation;Handout   Comprehension Verbalized understanding               Lung Clinic Goals - 12/04/14 1553    Patient will be able to verbalize understanding of the benefit of exercise to decrease fatigue.   Status Achieved   Patient will be  able to verbalize the importance of posture.   Status Achieved   Patient will be able to demonstrate diaphragmatic breathing for improved lung function.   Status Achieved   Patient will be able to verbalize understanding of the role of physical therapy to prevent functional decline and who to contact if physical therapy is needed.   Status Achieved             Plan - 12-28-2014 1520    Clinical Impression Statement Frail looking woman with nasal canula oxygen sitting in a wheelchair and possibly with some mental confusion (brought up embroidering in the middle of conversation about physical capabilities); has balance problems that may benefit from therapy.   Pt will benefit from skilled therapeutic intervention in order to improve on the following deficits Decreased balance;Cardiopulmonary status limiting activity;Decreased endurance   Rehab Potential Fair   PT Frequency One time visit   PT Treatment/Interventions Patient/family education   PT Next Visit Plan None at this time; patient could benefit from therapy for balance and endurance going forward.   PT Home Exercise Plan see education section   Consulted and Agree with Plan of Care Patient;Family member/caregiver          G-Codes - December 28, 2014 1553    Functional Assessment Tool Used clinical judgement   Functional Limitation Mobility: Walking and moving around   Mobility: Walking and Moving Around Current Status [phone]) At least 40 percent but less than 60 percent impaired, limited or restricted   Mobility: Walking and Moving Around Goal Status [phone]) At least 40 percent but less  than 60 percent impaired, limited or restricted   Mobility: Walking and Moving Around Discharge Status [phone]) At least 40 percent but less than 60 percent impaired, limited or restricted       Problem List Patient Active Problem List   Diagnosis Date Noted  . Primary cancer of right upper lobe of lung 12/19/2013  . HTN (hypertension)   . Hypercholesteremia   . CAD (coronary artery disease)   . Aortic valve disorders   . RLS (restless legs syndrome)   . A-fib   . Osteopenia   . Mitral valve disease   . Hypothyroidism   . COPD (chronic obstructive pulmonary disease)   . CHF (congestive heart failure)   . Aortic stenosis   . Mastoiditis     SALISBURY,DONNA 2014-12-28, 3:55 PM  [place], [place], [address] Phone: [phone]   Fax:  [place], PT 12/28/14 3:55 PM

## 2014-12-04 NOTE — Progress Notes (Signed)
[place] Clinical Social Work  Clinical Social Work met with patient/family and Futures trader at [facility] appointment to offer support and assess for psychosocial needs.  Medical oncologist reviewed patient's diagnosis and recommended treatment plan with patient/family.  Patient was accompanied by her daughter, whom she identifies as her primary and essentially only support.  They reside together and live in [place], [place].  Ms. Hruska has no concerns or questions at this time, she expressed "why worry about it, it's going to happen either way".  Patient's daughter had no concerns, except her goal is to "keep my mother positive".  Clinical Social Work briefly discussed Clinical Social Work role and [employer] support programs/services.  Clinical Social Work encouraged patient to call with any additional questions or concerns.   Polo Riley, MSW, LCSW, OSW-C Clinical Social Worker [facility] [phone]

## 2014-12-04 NOTE — Progress Notes (Signed)
Oncology Nurse Navigator Documentation  Oncology Nurse Navigator Flowsheets 12/04/2014  Navigator Encounter Type Clinic/MDC  Patient Visit Type Initial  Treatment Phase Abnormal Scans  Barriers/Navigation Needs Education  Education Newly Diagnosed Cancer Education  Interventions Education Method  Coordination of Care -  Time Spent with Patient 15         Thoracic Treatment Summary Name:Marissa [name] Date:12/04/2014 DOB:25-Apr-1933 Your Medical Team Medical Oncologist:Dr. Julien Nordmann Radiation Oncologist:Dr. Tammi Klippel Surgeon:Dr. Roxan Hockey Type and Stage of Lung Cancer Non-Small Cell Carcinoma: Squamous Cell  Clinical Stage:  Primary cancer of right upper lobe of lung   Staging form: Lung, AJCC 7th Edition     Clinical stage from 12/04/2014: Stage IIIA (T2a, N2, M0) - Signed by Curt Bears, MD on 12/04/2014    Clinical stage is based on radiology exams.  Pathological stage will be determined after surgery.  Staging is based on the size of the tumor, involvement of lymph nodes or not, and whether or not the cancer center has spread.  Recommendations Recommendations: Concurrent chemo radiation therapy  These recommendations are based on information available as of today's consult.  This is subject to change depending further testing or exams. Next Steps Next Step: Medical Oncology will set up follow up appointments 12/09/14 1st chemotherapy Barriers to Care What do you perceive as a potential barrier that may prevent you from receiving your treatment plan? Education information given and explained  Financial will see FA   Resources Given: NCI Booklet on [employer] at [employer].Radonna Ricker [phone] Fall Risk Information    Questions Norton Blizzard, RN BSN Thoracic Oncology Nurse Navigator at [place] is a nurse navigator that is available to assist you through your cancer journey.  She can answer your  questions and/or provide resources regarding your treatment plan, emotional support, or financial concerns. [name]

## 2014-12-04 NOTE — Telephone Encounter (Signed)
Gave and pirnted appt sched and avs for pt for Sept and OCT

## 2014-12-04 NOTE — Progress Notes (Signed)
[place] [phone]   [phone] Multidisciplinary thoracic oncology clinic  CONSULT NOTE  REFERRING PHYSICIAN: Dr. Modesto Charon  REASON FOR CONSULTATION:  81 years old white female with recurrent lung cancer.  HPI Marissa [name] is a 81 y.o. female with past medical history significant for multiple medical problems including history of hypertension, coronary artery disease, dyslipidemia, hypothyroidism, COPD, congestive heart failure, osteoporosis, atrial fibrillation as well as diabetes mellitus. The patient was diagnosed in September 2015 with stage IA non-small cell lung cancer after she was admitted to a local hospital with edema of the lower extremity and during her evaluation CT scan of the chest on 12/02/2013 showed malignant the spiculated 1.6 x 1.4 cm right upper lobe pulmonary nodule concerning for bronchogenic carcinoma. A PET scan on 12/16/2013 showed hypermetabolic right upper lobe pulmonary nodule without evidence of mediastinal metastasis or distant metastasis. On 01/06/2014 the patient underwent bronchoscopy with electromagnetic navigational bronchoscopy with brushings, biopsies and needle aspiration as well as bronchoalveolar lavage. She also had endobronchial ultrasound with mediastinal lymph node aspirations under the care of Dr. Roxan Hockey. The final pathology at that time showed atypical cells. The patient was not a good surgical candidate and she underwent curative SBRT for a total dose of 54 GY in 3 fractions completed on 02/17/2014. The patient was followed by observation and first CT scan of the chest after the procedure on 04/10/2014 showed interval response to therapy with a slight decrease in the size of the right upper lobe nodule with surrounding radiation changes and no evidence of metastatic disease. Repeat CT scan of the chest without contrast on 10/13/2014 showed new consolidation in the right upper lobe with  measurable nodularity and concerning for lung cancer recurrence. There was also interval mild increase in mediastinal lymphadenopathy concerning for disease recurrence. A PET scan was performed on 11/10/2014 and it showed enlarging focus of hypermetabolism at the site of the original right upper lobe lesion consistent with recurrent disease. There was surrounding pulmonary opacity demonstrating only low level metabolic activity most consistent with radiation pneumonitis. There was new right hilar and mediastinal nodal metastasis and no evidence for distant metastatic disease identified. On 11/21/2014 the patient underwent electromagnetic navigational bronchoscopy with needle aspirations, brushings, and biopsies. endobronchial ultrasound with mediastinal lymph node aspirations under the care of Dr. Roxan Hockey. The final pathology (Accession: [id number]) was consistent with squamous cell carcinoma. Dr. Roxan Hockey kindly referred the patient to the multidisciplinary thoracic oncology clinic today for evaluation and discussion of her treatment options. The patient was already seen by Dr. Tammi Klippel and is considered for radiotherapy next week. When seen today she was a little bit sleepy but continues to complain of cough productive of yellowish sputum with no hemoptysis. She was around 4 pounds in the last few weeks. The patient denied having any significant chest pain but has shortness breath at baseline and increased with exertion. She has no significant nausea or vomiting. She has no fever or chills. Family history significant for father who died from old age. The patient is a widow and has one daughter, Marissa [name]. She is currently retired and used to work in the [employer]. She has a history of smoking for around 30 years but quit in 1980s. She drinks alcohol occasionally and no history of drug abuse.  HPI  Past Medical History  Diagnosis Date  . HTN (hypertension)   . Hypercholesteremia   . CAD  (coronary artery disease)     s/p CABG 03/2011  .  Aortic valve disorders   . RLS (restless legs syndrome)   . A-fib   . Osteopenia   . Mitral valve disease     s/p MVR 03/2011 w/ 27 mm porcine valve  . Hypothyroidism   . COPD (chronic obstructive pulmonary disease)   . CHF (congestive heart failure)   . Aortic stenosis     (s/p AVR w/ 21 mm porcine valve 03/2011  . Mastoiditis   . Myocardial infarction     "slight"  . Dysrhythmia   . Shortness of breath     with exertion  . Pre-diabetes   . GERD (gastroesophageal reflux disease)     takes nexium prn  . Arthritis   . History of blood transfusion     Rheumatic Fever  . Lung cancer   . Pneumonia   . Diabetes mellitus without complication     Past Surgical History  Procedure Laterality Date  . Mitral valve replacement      1990's  . Laparoscopic cholecystectomy    . Aortic valve replacement    . Tonsillectomy    . Coronary artery bypass graft  03/2011    1990's also  . Video bronchoscopy with endobronchial navigation N/A 01/06/2014    Procedure: VIDEO BRONCHOSCOPY WITH ENDOBRONCHIAL NAVIGATION;  Surgeon: [name], MD;  Location: [place];  Service: Thoracic;  Laterality: N/A;  . Video bronchoscopy with endobronchial ultrasound N/A 01/06/2014    Procedure: VIDEO BRONCHOSCOPY WITH ENDOBRONCHIAL ULTRASOUND;  Surgeon: [name], MD;  Location: [place];  Service: Thoracic;  Laterality: N/A;  . Video bronchoscopy with endobronchial navigation N/A 11/21/2014    Procedure: VIDEO BRONCHOSCOPY WITH ENDOBRONCHIAL NAVIGATION;  Surgeon: [name], MD;  Location: [place];  Service: Thoracic;  Laterality: N/A;  . Video bronchoscopy with endobronchial ultrasound N/A 11/21/2014    Procedure: VIDEO BRONCHOSCOPY WITH ENDOBRONCHIAL ULTRASOUND;  Surgeon: [name], MD;  Location: [place];  Service: Thoracic;  Laterality: N/A;    Family History  Problem Relation Age of Onset  . Adopted: Yes  . Cancer Brother      lung  . Heart attack Neg Hx   . Stroke Neg Hx     Social History Social History  Substance Use Topics  . Smoking status: Former Smoker -- 0.50 packs/day for 30 years    Types: Cigarettes  . Smokeless tobacco: Never Used  . Alcohol Use: No    Allergies  Allergen Reactions  . Coumadin [Warfarin Sodium] Other (See Comments)    Reaction: Bleeding from ears  . Prozac [Fluoxetine Hcl] Rash    Current Outpatient Prescriptions  Medication Sig Dispense Refill  . albuterol (PROVENTIL) (2.5 MG/3ML) 0.083% nebulizer solution     . alendronate (FOSAMAX) 70 MG tablet Take 70 mg by mouth once a week.     [name] aspirin EC 81 MG tablet Take 1 tablet (81 mg total) by mouth daily.    [name] atorvastatin (LIPITOR) 40 MG tablet Take 40 mg by mouth daily.     [name] CALCIUM CARBONATE PO Take 1 tablet by mouth 2 (two) times daily.    . carvedilol (COREG) 6.25 MG tablet Take 1 tablet (6.25 mg total) by mouth 2 (two) times daily with a meal. 180 tablet 1  . digoxin (LANOXIN) 0.125 MG tablet Take 0.125 mg by mouth daily.     [name] esomeprazole (NEXIUM) 40 MG capsule Take 40 mg by mouth daily as needed (for acid reflux).     . fluticasone-salmeterol (ADVAIR HFA) 115-21  MCG/ACT inhaler Inhale 2 puffs into the lungs 2 (two) times daily.     . furosemide (LASIX) 40 MG tablet Take 20-40 mg by mouth 2 (two) times daily. Take 40 mg by mouth in the morning and take 20 mg by mouth in the evening for fluid.    [name] glimepiride (AMARYL) 1 MG tablet Take 1 mg by mouth daily with breakfast.    . Ipratropium-Albuterol (COMBIVENT) 20-100 MCG/ACT AERS respimat Inhale 1 puff into the lungs every 6 (six) hours as needed for wheezing or shortness of breath.     [name] ipratropium-albuterol (DUONEB) 0.5-2.5 (3) MG/3ML SOLN Take 3 mLs by nebulization every 6 (six) hours as needed (for shortness of breath).     [name] levothyroxine (SYNTHROID, LEVOTHROID) 100 MCG tablet Take 100 mcg by mouth daily before breakfast.     . potassium chloride SA  (K-DUR,KLOR-CON) 20 MEQ tablet Take 20 mEq by mouth daily.     [name] rOPINIRole (REQUIP) 4 MG tablet Take 4 mg by mouth at bedtime.    . vitamin C (ASCORBIC ACID) 500 MG tablet Take 500 mg by mouth daily.     No current facility-administered medications for this visit.    Review of Systems  Constitutional: positive for fatigue and weight loss Eyes: negative Ears, nose, mouth, throat, and face: negative Respiratory: positive for cough and dyspnea on exertion Cardiovascular: negative Gastrointestinal: negative Genitourinary:negative Integument/breast: negative Hematologic/lymphatic: negative Musculoskeletal:negative Neurological: negative Behavioral/Psych: negative Endocrine: negative Allergic/Immunologic: negative  Physical Exam  VVO:HYWVP, healthy, no distress, well nourished and well developed SKIN: skin color, texture, turgor are normal, no rashes or significant lesions HEAD: Normocephalic, No masses, lesions, tenderness or abnormalities EYES: normal, PERRLA, Conjunctiva are pink and non-injected EARS: External ears normal, Canals clear OROPHARYNX:no exudate, no erythema and lips, buccal mucosa, and tongue normal  NECK: supple, no adenopathy, no JVD LYMPH:  no palpable lymphadenopathy, no hepatosplenomegaly BREAST:not examined LUNGS: clear to auscultation , and palpation HEART: regular rate & rhythm, no murmurs and no gallops ABDOMEN:abdomen soft, non-tender, normal bowel sounds and no masses or organomegaly BACK: Back symmetric, no curvature., No CVA tenderness EXTREMITIES:no joint deformities, effusion, or inflammation, no edema, no skin discoloration  NEURO: alert & oriented x 3 with fluent speech, no focal motor/sensory deficits  PERFORMANCE STATUS: ECOG 1  LABORATORY DATA: Lab Results  Component Value Date   WBC 11.2* 12/04/2014   HGB 12.7 12/04/2014   HCT 38.8 12/04/2014   MCV 83.2 12/04/2014   PLT 248 12/04/2014      Chemistry      Component Value  Date/Time   NA 141 12/04/2014 1319   NA 139 11/21/2014 0638   NA 137 04/17/2014 0409   K 3.3* 12/04/2014 1319   K 3.8 11/21/2014 0638   K 2.8* 04/18/2014 0422   CL 100* 11/21/2014 0638   CL 98 04/17/2014 0409   CO2 32* 12/04/2014 1319   CO2 32 11/21/2014 0638   CO2 30 04/17/2014 0409   BUN 19.6 12/04/2014 1319   BUN 21* 11/21/2014 0638   BUN 19* 04/17/2014 0409   CREATININE 0.9 12/04/2014 1319   CREATININE 0.88 11/21/2014 0638   CREATININE 1.08 04/17/2014 0409      Component Value Date/Time   CALCIUM 9.4 12/04/2014 1319   CALCIUM 9.3 11/21/2014 0638   CALCIUM 9.0 04/17/2014 0409   ALKPHOS 92 12/04/2014 1319   ALKPHOS 80 11/21/2014 0638   AST 15 12/04/2014 1319   AST 22 11/21/2014 0638   ALT 14 12/04/2014 1319  ALT 20 11/21/2014 0638   BILITOT 0.96 12/04/2014 1319   BILITOT 0.8 11/21/2014 5009       RADIOGRAPHIC STUDIES: Dg Chest 2 View  11/21/2014   CLINICAL DATA:  Preop floor endobronchial navigation and video bronchoscopy.  EXAM: CHEST  2 VIEW  COMPARISON:  CT 11/20/2014 .  FINDINGS: The right upper lobe mass is again evident, as well as a prominent right hilar contours, unchanged from 11/20/2014. Mild diffuse interstitial reticular nodular coarsening persists throughout the right lung. No effusions are evident. There is no pneumothorax. There is prior sternotomy and cardiac valvuloplasty. There is moderate cardiomegaly.  IMPRESSION: Right upper lobe mass and probable right hilar adenopathy. Diffuse interstitial coarsening in the right lung. Moderate cardiomegaly. Unchanged from 11/20/2014.   Electronically Signed   By: Andreas Newport M.D.   On: 11/21/2014 06:40   Ct Chest Wo Contrast  11/20/2014   CLINICAL DATA:  Recurrent lung cancer.  Cough, short of breath.  EXAM: CT CHEST WITHOUT CONTRAST  TECHNIQUE: Multidetector CT imaging of the chest was performed following the standard protocol without IV contrast.  COMPARISON:  FDG PET scan 11/08/2014  FINDINGS:  Mediastinum/Nodes: No axillary or supraclavicular adenopathy. Mediastinal lymph nodes are difficult to define [place] noncontrast exam. There is an enlarged lymph node adjacent to the distal aspect of the superior vena cava along its RIGHT margin measuring 10 mm (image 29 series 3). This was hypermetabolic on comparison PET-CT scan. No pericardial fluid.  Lungs/Pleura: The oblong mass in the RIGHT upper lobe which was hypermetabolic on comparison PET-CT scan measures 3.3 x 2.0 cm compared to 2.7 by 1.6 cm for interval increase in size.  There is a postobstructive pneumonitis and presumable radiation change in the RIGHT upper lobe and field of consolidation which is not significantly changed on image 16, series 4.  There is atelectasis at the LEFT and RIGHT lung base unchanged from prior.  Upper abdomen: Limited view of the liver, kidneys, pancreas are unremarkable. Normal adrenal glands.  Musculoskeletal: No aggressive osseous lesion.  IMPRESSION: 1. Continued enlargement of RIGHT upper lobe nodule / mass concerning for recurrent lung cancer on comparison PET-CT scan. 2. Stable consolidation in the RIGHT upper lobe at site of prior radiation treatment and surgery. 3. Stable mediastinal lymph nodes which are difficult to assess on this noncontrast exam.   Electronically Signed   By: Suzy Bouchard M.D.   On: 11/20/2014 14:51   Nm Pet Image Restag (ps) Skull Base To Thigh  11/10/2014   CLINICAL DATA:  Subsequent treatment strategy for right upper lobe lung cancer post SBRT.  EXAM: NUCLEAR MEDICINE PET SKULL BASE TO THIGH  TECHNIQUE: 7.5 mCi F-18 FDG was injected intravenously. Full-ring PET imaging was performed from the skull base to thigh after the radiotracer. CT data was obtained and used for attenuation correction and anatomic localization.  FASTING BLOOD GLUCOSE:  Value: 183 mg/dl  COMPARISON:  PET-CT 12/16/2013.  Chest CT 10/13/2014.  FINDINGS: NECK  No hypermetabolic cervical lymph nodes are identified.There  are no lesions of the pharyngeal mucosal space. There is stable homogeneous thyroid activity. Intracranial atrophy noted.  CHEST  The enlarging mediastinal lymph nodes demonstrated on recent CT are hypermetabolic. Low right paratracheal node measuring approximately 14 mm short axis on image 61 has an SUV max of 6.2. 1.3 cm subcarinal node has an SUV max of 7.2. 11 mm node anterior to the right hilum has an SUV max of 7.6. There is low-level metabolic activity throughout the right upper  lobe consolidation, most likely representing radiation pneumonitis. However, along the posterior margin of this process, there is residual focal hypermetabolic activity with an SUV max of 12.0. This corresponds with the original lesion and appears slightly larger than on prior PET-CT, measuring up to 2.7 cm on image 20. No other suspicious pulmonary activity. There is linear left lower lobe scarring or atelectasis. Diffuse atherosclerosis noted status post median sternotomy, CABG and valve replacement. There are trace pleural effusions.  ABDOMEN/PELVIS  There is no hypermetabolic activity within the liver, adrenal glands, spleen or pancreas. There is no hypermetabolic nodal activity. Urine contamination noted in the perineal region and anal crease. Hepatic steatosis, right renal cyst, atherosclerosis and previous pelvic lymph node dissection noted.  SKELETON  There is no hypermetabolic activity to suggest osseous metastatic disease.  IMPRESSION: 1. Enlarging focus of hypermetabolism at site of original right upper lobe lesion consistent with recurrent disease. Surrounding pulmonary opacities demonstrate only low level metabolic activity, most consistent with radiation pneumonitis. 2. New right hilar and mediastinal nodal metastases. 3. No distant metastases identified.   Electronically Signed   By: Richardean Sale M.D.   On: 11/10/2014 14:49   Dg Chest Port 1 View  11/21/2014   CLINICAL DATA:  81 year old female status post  bronchoscopy. Right upper lobe mass. Initial encounter.  EXAM: PORTABLE CHEST - 1 VIEW  COMPARISON:  0516 hours today, and earlier.  FINDINGS: Interval increased right upper lobe opacity since 0516 hours. No pneumothorax. Trace fluid now along the minor fissure which is seen to be elevated. Stable bilateral lung markings elsewhere. Stable cardiac size and mediastinal contours. Sequelae of CABG and cardiac valve replacement.  IMPRESSION: Status post bronchoscopy with no pneumothorax. Acutely increased right upper lobe opacity probably related to BAL.   Electronically Signed   By: Genevie Ann M.D.   On: 11/21/2014 10:30   Dg C-arm Bronchoscopy  11/21/2014   CLINICAL DATA:    C-ARM BRONCHOSCOPY  Fluoroscopy was utilized by the requesting physician.  No radiographic  interpretation.     ASSESSMENT: This is a very pleasant 81 years old white female recently diagnosed with recurrent non-small cell lung cancer presenting as a stage IIIA (T2a, N2, M0) non-small cell lung cancer, squamous cell carcinoma presented with recurrence on the right upper lobe in addition to mediastinal lymphadenopathy diagnosed in September 2016.   PLAN: I had a lengthy discussion with the patient and her daughter today about her current disease stage, prognosis and treatment options. I will complete the staging workup by ordering a MRI of the brain to rule out brain metastasis. I recommended for the patient a course of concurrent chemoradiation with weekly carboplatin for AUC of 2 and paclitaxel 45 MG/M2. I discussed with the patient adverse effect of the chemotherapy including but not limited to alopecia, myelosuppression, nausea and vomiting, peripheral neuropathy, liver or renal dysfunction. I will arrange for the patient to have a chemotherapy education class before starting the first dose of his chemotherapy. She is expected to start the first dose of this treatment next week if receive preauthorization for her treatment. The patient  was already seen by Dr. Tammi Klippel and expected to start the first fraction of radiotherapy next week. I will call her pharmacy with prescription for Compazine 10 mg by mouth every 6 hours as needed for nausea The patient would come back for follow-up visit in 2 weeks for reevaluation and management of any adverse effect of her treatment. She was advised to call immediately if she  has any concerning symptoms in the interval. The patient was seen during the multidisciplinary thoracic oncology clinic today by medical oncology, thoracic navigator, social worker and physical therapist. The patient voices understanding of current disease status and treatment options and is in agreement with the current care plan.  All questions were answered. The patient knows to call the clinic with any problems, questions or concerns. We can certainly see the patient much sooner if necessary.  Thank you so much for allowing me to participate in the care of [place]. I will continue to follow up the patient with you and assist in her care.  I spent 55 minutes counseling the patient face to face. The total time spent in the appointment was 80 minutes.  Disclaimer: This note was dictated with voice recognition software. Similar sounding words can inadvertently be transcribed and may not be corrected upon review.   Charell Faulk K. December 04, 2014, 2:45 PM

## 2014-12-04 NOTE — Telephone Encounter (Signed)
Gave adn printed papt sched and avs for pt for Sept adn OCT

## 2014-12-05 DIAGNOSIS — R2681 Unsteadiness on feet: Secondary | ICD-10-CM | POA: Diagnosis not present

## 2014-12-05 DIAGNOSIS — R0602 Shortness of breath: Secondary | ICD-10-CM | POA: Diagnosis not present

## 2014-12-05 DIAGNOSIS — C3411 Malignant neoplasm of upper lobe, right bronchus or lung: Secondary | ICD-10-CM | POA: Diagnosis not present

## 2014-12-07 DIAGNOSIS — C3411 Malignant neoplasm of upper lobe, right bronchus or lung: Secondary | ICD-10-CM | POA: Diagnosis not present

## 2014-12-08 ENCOUNTER — Other Ambulatory Visit: Payer: Self-pay | Admitting: Medical Oncology

## 2014-12-08 ENCOUNTER — Other Ambulatory Visit: Payer: Medicare Other

## 2014-12-08 ENCOUNTER — Encounter (HOSPITAL_COMMUNITY): Payer: Self-pay

## 2014-12-08 ENCOUNTER — Ambulatory Visit
Admission: RE | Admit: 2014-12-08 | Discharge: 2014-12-08 | Disposition: A | Discharge status: Home / Self Care | Payer: Medicare Other | Source: Ambulatory Visit | Attending: Radiation Oncology | Admitting: Radiation Oncology

## 2014-12-08 DIAGNOSIS — C3411 Malignant neoplasm of upper lobe, right bronchus or lung: Secondary | ICD-10-CM | POA: Diagnosis present

## 2014-12-08 DIAGNOSIS — R0602 Shortness of breath: Secondary | ICD-10-CM | POA: Insufficient documentation

## 2014-12-08 DIAGNOSIS — R2681 Unsteadiness on feet: Secondary | ICD-10-CM | POA: Insufficient documentation

## 2014-12-09 ENCOUNTER — Ambulatory Visit (HOSPITAL_BASED_OUTPATIENT_CLINIC_OR_DEPARTMENT_OTHER): Payer: Medicare Other

## 2014-12-09 ENCOUNTER — Other Ambulatory Visit (HOSPITAL_BASED_OUTPATIENT_CLINIC_OR_DEPARTMENT_OTHER): Payer: Medicare Other

## 2014-12-09 ENCOUNTER — Encounter: Payer: Self-pay | Admitting: Internal Medicine

## 2014-12-09 ENCOUNTER — Ambulatory Visit
Admission: RE | Admit: 2014-12-09 | Discharge: 2014-12-09 | Disposition: A | Discharge status: Home / Self Care | Payer: Medicare Other | Source: Ambulatory Visit | Attending: Radiation Oncology | Admitting: Radiation Oncology

## 2014-12-09 ENCOUNTER — Other Ambulatory Visit: Payer: Self-pay | Admitting: Medical Oncology

## 2014-12-09 VITALS — BP 151/48 | HR 59 | Temp 98.2°F | Resp 28

## 2014-12-09 DIAGNOSIS — Z5111 Encounter for antineoplastic chemotherapy: Secondary | ICD-10-CM

## 2014-12-09 DIAGNOSIS — C3411 Malignant neoplasm of upper lobe, right bronchus or lung: Secondary | ICD-10-CM | POA: Diagnosis not present

## 2014-12-09 DIAGNOSIS — R2681 Unsteadiness on feet: Secondary | ICD-10-CM | POA: Diagnosis not present

## 2014-12-09 DIAGNOSIS — T451X5A Adverse effect of antineoplastic and immunosuppressive drugs, initial encounter: Principal | ICD-10-CM

## 2014-12-09 DIAGNOSIS — R0602 Shortness of breath: Secondary | ICD-10-CM | POA: Diagnosis not present

## 2014-12-09 DIAGNOSIS — R112 Nausea with vomiting, unspecified: Secondary | ICD-10-CM

## 2014-12-09 LAB — COMPREHENSIVE METABOLIC PANEL (CC13)
ALBUMIN: 3.4 g/dL — AB (ref 3.5–5.0)
ALK PHOS: 89 U/L (ref 40–150)
ALT: 17 U/L (ref 0–55)
AST: 19 U/L (ref 5–34)
Anion Gap: 11 mEq/L (ref 3–11)
BILIRUBIN TOTAL: 0.81 mg/dL (ref 0.20–1.20)
BUN: 21 mg/dL (ref 7.0–26.0)
CALCIUM: 9.5 mg/dL (ref 8.4–10.4)
CO2: 30 mEq/L — ABNORMAL HIGH (ref 22–29)
CREATININE: 0.9 mg/dL (ref 0.6–1.1)
Chloride: 98 mEq/L (ref 98–109)
EGFR: 60 mL/min/{1.73_m2} — ABNORMAL LOW (ref >90)
Glucose: 192 mg/dl — ABNORMAL HIGH (ref 70–140)
Potassium: 3.6 mEq/L (ref 3.5–5.1)
Sodium: 139 mEq/L (ref 136–145)
Total Protein: 6.8 g/dL (ref 6.4–8.3)

## 2014-12-09 LAB — CBC WITH DIFFERENTIAL/PLATELET
BASO%: 1.1 % (ref 0.0–2.0)
BASOS ABS: 0.1 10*3/uL (ref 0.0–0.1)
EOS%: 1.1 % (ref 0.0–7.0)
Eosinophils Absolute: 0.1 10*3/uL (ref 0.0–0.5)
HEMATOCRIT: 38.2 % (ref 34.8–46.6)
HEMOGLOBIN: 12.5 g/dL (ref 11.6–15.9)
LYMPH#: 1.2 10*3/uL (ref 0.9–3.3)
LYMPH%: 10.7 % — ABNORMAL LOW (ref 14.0–49.7)
MCH: 27.1 pg (ref 25.1–34.0)
MCHC: 32.7 g/dL (ref 31.5–36.0)
MCV: 82.8 fL (ref 79.5–101.0)
MONO#: 0.9 10*3/uL (ref 0.1–0.9)
MONO%: 7.9 % (ref 0.0–14.0)
NEUT#: 8.9 10*3/uL — ABNORMAL HIGH (ref 1.5–6.5)
NEUT%: 79.2 % — ABNORMAL HIGH (ref 38.4–76.8)
Platelets: 221 10*3/uL (ref 145–400)
RBC: 4.62 10*6/uL (ref 3.70–5.45)
RDW: 16.4 % — AB (ref 11.2–14.5)
WBC: 11.3 10*3/uL — ABNORMAL HIGH (ref 3.9–10.3)

## 2014-12-09 MED ORDER — SODIUM CHLORIDE 0.9 % IV SOLN
142.4000 mg | Freq: Once | INTRAVENOUS | Status: AC
  Administered 2014-12-09: 140 mg via INTRAVENOUS
  Filled 2014-12-09: qty 14

## 2014-12-09 MED ORDER — PACLITAXEL CHEMO INJECTION 300 MG/50ML
45.0000 mg/m2 | Freq: Once | INTRAVENOUS | Status: AC
  Administered 2014-12-09: 72 mg via INTRAVENOUS
  Filled 2014-12-09: qty 12

## 2014-12-09 MED ORDER — FAMOTIDINE IN NACL 20-0.9 MG/50ML-% IV SOLN
INTRAVENOUS | Status: AC
  Filled 2014-12-09: qty 50

## 2014-12-09 MED ORDER — DIPHENHYDRAMINE HCL 50 MG/ML IJ SOLN
INTRAMUSCULAR | Status: AC
  Filled 2014-12-09: qty 1

## 2014-12-09 MED ORDER — DEXAMETHASONE SODIUM PHOSPHATE 100 MG/10ML IJ SOLN
Freq: Once | INTRAMUSCULAR | Status: AC
  Administered 2014-12-09: 11:00:00 via INTRAVENOUS
  Filled 2014-12-09: qty 8

## 2014-12-09 MED ORDER — SODIUM CHLORIDE 0.9 % IV SOLN
Freq: Once | INTRAVENOUS | Status: AC
  Administered 2014-12-09: 10:00:00 via INTRAVENOUS

## 2014-12-09 MED ORDER — FAMOTIDINE IN NACL 20-0.9 MG/50ML-% IV SOLN
20.0000 mg | Freq: Once | INTRAVENOUS | Status: AC
  Administered 2014-12-09: 20 mg via INTRAVENOUS

## 2014-12-09 MED ORDER — PROCHLORPERAZINE MALEATE 10 MG PO TABS: 10.0000 mg | ORAL_TABLET | Freq: Four times a day (QID) | ORAL | Status: DC | PRN

## 2014-12-09 MED ORDER — DIPHENHYDRAMINE HCL 50 MG/ML IJ SOLN
50.0000 mg | Freq: Once | INTRAMUSCULAR | Status: AC
  Administered 2014-12-09: 50 mg via INTRAVENOUS

## 2014-12-09 NOTE — Progress Notes (Signed)
Compazine called in to [place]

## 2014-12-09 NOTE — Patient Instructions (Signed)
[facility] Discharge Instructions for Patients Receiving Chemotherapy  Today you received the following chemotherapy agents:  Taxol & Carboplatin  To help prevent nausea and vomiting after your treatment, we encourage you to take your nausea medication. If you develop nausea and vomiting that is not controlled by your nausea medication, call the clinic.   BELOW ARE SYMPTOMS THAT SHOULD BE REPORTED IMMEDIATELY:  *FEVER GREATER THAN 100.5 F  *CHILLS WITH OR WITHOUT FEVER  NAUSEA AND VOMITING THAT IS NOT CONTROLLED WITH YOUR NAUSEA MEDICATION  *UNUSUAL SHORTNESS OF BREATH  *UNUSUAL BRUISING OR BLEEDING  TENDERNESS IN MOUTH AND THROAT WITH OR WITHOUT PRESENCE OF ULCERS  *URINARY PROBLEMS  *BOWEL PROBLEMS  UNUSUAL RASH Items with * indicate a potential emergency and should be followed up as soon as possible.  Feel free to call the clinic you have any questions or concerns. The clinic phone number is [phone].  Please show the [place] at check-in to the Emergency Department and triage nurse.  Paclitaxel injection What is this medicine? PACLITAXEL (PAK li TAX el) is a chemotherapy drug. It targets fast dividing cells, like cancer cells, and causes these cells to die. This medicine is used to treat ovarian cancer, breast cancer, and other cancers. This medicine may be used for other purposes; ask your health care provider or pharmacist if you have questions. COMMON BRAND NAME(S): Onxol, Taxol What should I tell my health care provider before I take this medicine? They need to know if you have any of these conditions: -blood disorders -irregular heartbeat -infection (especially a virus infection such as chickenpox, cold sores, or herpes) -liver disease -previous or ongoing radiation therapy -an unusual or allergic reaction to paclitaxel, alcohol, polyoxyethylated castor oil, other chemotherapy agents, other medicines, foods, dyes, or  preservatives -pregnant or trying to get pregnant -breast-feeding How should I use this medicine? This drug is given as an infusion into a vein. It is administered in a hospital or clinic by a specially trained health care professional. Talk to your pediatrician regarding the use of this medicine in children. Special care may be needed. Overdosage: If you think you have taken too much of this medicine contact a poison control center or emergency room at once. NOTE: This medicine is only for you. Do not share this medicine with others. What if I miss a dose? It is important not to miss your dose. Call your doctor or health care professional if you are unable to keep an appointment. What may interact with this medicine? Do not take this medicine with any of the following medications: -disulfiram -metronidazole This medicine may also interact with the following medications: -cyclosporine -diazepam -ketoconazole -medicines to increase blood counts like filgrastim, pegfilgrastim, sargramostim -other chemotherapy drugs like cisplatin, doxorubicin, epirubicin, etoposide, teniposide, vincristine -quinidine -testosterone -vaccines -verapamil Talk to your doctor or health care professional before taking any of these medicines: -acetaminophen -aspirin -ibuprofen -ketoprofen -naproxen This list may not describe all possible interactions. Give your health care provider a list of all the medicines, herbs, non-prescription drugs, or dietary supplements you use. Also tell them if you smoke, drink alcohol, or use illegal drugs. Some items may interact with your medicine. What should I watch for while using this medicine? Your condition Monia Timmers be monitored carefully while you are receiving this medicine. You Garima Chronis need important blood work done while you are taking this medicine. This drug may make you feel generally unwell. This is not uncommon, as chemotherapy can affect healthy cells  as well as cancer  cells. Report any side effects. Continue your course of treatment even though you feel ill unless your doctor tells you to stop. In some cases, you may be given additional medicines to help with side effects. Follow all directions for their use. Call your doctor or health care professional for advice if you get a fever, chills or sore throat, or other symptoms of a cold or flu. Do not treat yourself. This drug decreases your body's ability to fight infections. Try to avoid being around people who are sick. This medicine may increase your risk to bruise or bleed. Call your doctor or health care professional if you notice any unusual bleeding. Be careful brushing and flossing your teeth or using a toothpick because you may get an infection or bleed more easily. If you have any dental work done, tell your dentist you are receiving this medicine. Avoid taking products that contain aspirin, acetaminophen, ibuprofen, naproxen, or ketoprofen unless instructed by your doctor. These medicines may hide a fever. Do not become pregnant while taking this medicine. Women should inform their doctor if they wish to become pregnant or think they might be pregnant. There is a potential for serious side effects to an unborn child. Talk to your health care professional or pharmacist for more information. Do not breast-feed an infant while taking this medicine. Men are advised not to father a child while receiving this medicine. What side effects may I notice from receiving this medicine? Side effects that you should report to your doctor or health care professional as soon as possible: -allergic reactions like skin rash, itching or hives, swelling of the face, lips, or tongue -low blood counts - This drug may decrease the number of white blood cells, red blood cells and platelets. You may be at increased risk for infections and bleeding. -signs of infection - fever or chills, cough, sore throat, pain or difficulty passing  urine -signs of decreased platelets or bleeding - bruising, pinpoint red spots on the skin, black, tarry stools, nosebleeds -signs of decreased red blood cells - unusually weak or tired, fainting spells, lightheadedness -breathing problems -chest pain -high or low blood pressure -mouth sores -nausea and vomiting -pain, swelling, redness or irritation at the injection site -pain, tingling, numbness in the hands or feet -slow or irregular heartbeat -swelling of the ankle, feet, hands Side effects that usually do not require medical attention (report to your doctor or health care professional if they continue or are bothersome): -bone pain -complete hair loss including hair on your head, underarms, pubic hair, eyebrows, and eyelashes -changes in the color of fingernails -diarrhea -loosening of the fingernails -loss of appetite -muscle or joint pain -red flush to skin -sweating This list may not describe all possible side effects. Call your doctor for medical advice about side effects. You may report side effects to FDA at 1-800-FDA-1088. Where should I keep my medicine? This drug is given in a hospital or clinic and Ismahan Lippman not be stored at home. NOTE: This sheet is a summary. It may not cover all possible information. If you have questions about this medicine, talk to your doctor, pharmacist, or health care provider.  2015, Elsevier/Gold Standard. (2012-04-30 16 Carboplatin injection What is this medicine? CARBOPLATIN (KAR boe pla tin) is a chemotherapy drug. It targets fast dividing cells, like cancer cells, and causes these cells to die. This medicine is used to treat ovarian cancer and many other cancers. This medicine may be used for other purposes;  ask your health care provider or pharmacist if you have questions. COMMON BRAND NAME(S): Paraplatin What should I tell my health care provider before I take this medicine? They need to know if you have any of these conditions: -blood  disorders -hearing problems -kidney disease -recent or ongoing radiation therapy -an unusual or allergic reaction to carboplatin, cisplatin, other chemotherapy, other medicines, foods, dyes, or preservatives -pregnant or trying to get pregnant -breast-feeding How should I use this medicine? This drug is usually given as an infusion into a vein. It is administered in a hospital or clinic by a specially trained health care professional. Talk to your pediatrician regarding the use of this medicine in children. Special care may be needed. Overdosage: If you think you have taken too much of this medicine contact a poison control center or emergency room at once. NOTE: This medicine is only for you. Do not share this medicine with others. What if I miss a dose? It is important not to miss a dose. Call your doctor or health care professional if you are unable to keep an appointment. What may interact with this medicine? -medicines for seizures -medicines to increase blood counts like filgrastim, pegfilgrastim, sargramostim -some antibiotics like amikacin, gentamicin, neomycin, streptomycin, tobramycin -vaccines Talk to your doctor or health care professional before taking any of these medicines: -acetaminophen -aspirin -ibuprofen -ketoprofen -naproxen This list may not describe all possible interactions. Give your health care provider a list of all the medicines, herbs, non-prescription drugs, or dietary supplements you use. Also tell them if you smoke, drink alcohol, or use illegal drugs. Some items may interact with your medicine. What should I watch for while using this medicine? Your condition Milady Fleener be monitored carefully while you are receiving this medicine. You Berkley Cronkright need important blood work done while you are taking this medicine. This drug may make you feel generally unwell. This is not uncommon, as chemotherapy can affect healthy cells as well as cancer cells. Report any side effects.  Continue your course of treatment even though you feel ill unless your doctor tells you to stop. In some cases, you may be given additional medicines to help with side effects. Follow all directions for their use. Call your doctor or health care professional for advice if you get a fever, chills or sore throat, or other symptoms of a cold or flu. Do not treat yourself. This drug decreases your body's ability to fight infections. Try to avoid being around people who are sick. This medicine may increase your risk to bruise or bleed. Call your doctor or health care professional if you notice any unusual bleeding. Be careful brushing and flossing your teeth or using a toothpick because you may get an infection or bleed more easily. If you have any dental work done, tell your dentist you are receiving this medicine. Avoid taking products that contain aspirin, acetaminophen, ibuprofen, naproxen, or ketoprofen unless instructed by your doctor. These medicines may hide a fever. Do not become pregnant while taking this medicine. Women should inform their doctor if they wish to become pregnant or think they might be pregnant. There is a potential for serious side effects to an unborn child. Talk to your health care professional or pharmacist for more information. Do not breast-feed an infant while taking this medicine. What side effects may I notice from receiving this medicine? Side effects that you should report to your doctor or health care professional as soon as possible: -allergic reactions like skin rash, itching or hives,  swelling of the face, lips, or tongue -signs of infection - fever or chills, cough, sore throat, pain or difficulty passing urine -signs of decreased platelets or bleeding - bruising, pinpoint red spots on the skin, black, tarry stools, nosebleeds -signs of decreased red blood cells - unusually weak or tired, fainting spells, lightheadedness -breathing problems -changes in  hearing -changes in vision -chest pain -high blood pressure -low blood counts - This drug may decrease the number of white blood cells, red blood cells and platelets. You may be at increased risk for infections and bleeding. -nausea and vomiting -pain, swelling, redness or irritation at the injection site -pain, tingling, numbness in the hands or feet -problems with balance, talking, walking -trouble passing urine or change in the amount of urine Side effects that usually do not require medical attention (report to your doctor or health care professional if they continue or are bothersome): -hair loss -loss of appetite -metallic taste in the mouth or changes in taste This list may not describe all possible side effects. Call your doctor for medical advice about side effects. You may report side effects to FDA at 1-800-FDA-1088. Where should I keep my medicine? This drug is given in a hospital or clinic and Aleksandra Raben not be stored at home. NOTE: This sheet is a summary. It may not cover all possible information. If you have questions about this medicine, talk to your doctor, pharmacist, or health care provider.  2015, Elsevier/Gold Standard. (2007-06-12 14:38:05)

## 2014-12-09 NOTE — Progress Notes (Unsigned)
OK for treatment with taxol despite low diastolic BP per Dr Julien Nordmann.  Pt reports feeling nauseated at end of 1st 15 min of taxol.

## 2014-12-09 NOTE — Progress Notes (Signed)
Introduced myself to pt as her FA.  Informed her of the [employer] and went over what it does and does not cover.  Pt applied for the grant and was approved for $400.  She received a copy of her approval letter.  I also researched foundations that offer copay assistance for chemo, unfortunately at this time there are no funds available for lung cancer but I was informed by her daughter that pt's secondary insurance should pick up the 20% that Medicare will not cover.  She has my card for any future questions or concerns.

## 2014-12-10 ENCOUNTER — Ambulatory Visit
Admission: RE | Admit: 2014-12-10 | Discharge: 2014-12-10 | Disposition: A | Discharge status: Home / Self Care | Payer: Medicare Other | Source: Ambulatory Visit | Attending: Radiation Oncology | Admitting: Radiation Oncology

## 2014-12-10 DIAGNOSIS — R2681 Unsteadiness on feet: Secondary | ICD-10-CM | POA: Diagnosis not present

## 2014-12-10 DIAGNOSIS — R0602 Shortness of breath: Secondary | ICD-10-CM | POA: Diagnosis not present

## 2014-12-10 DIAGNOSIS — C3411 Malignant neoplasm of upper lobe, right bronchus or lung: Secondary | ICD-10-CM | POA: Diagnosis not present

## 2014-12-11 ENCOUNTER — Telehealth: Payer: Self-pay | Admitting: Medical Oncology

## 2014-12-11 ENCOUNTER — Ambulatory Visit
Admission: RE | Admit: 2014-12-11 | Discharge: 2014-12-11 | Disposition: A | Discharge status: Home / Self Care | Payer: Medicare Other | Source: Ambulatory Visit | Attending: Radiation Oncology | Admitting: Radiation Oncology

## 2014-12-11 ENCOUNTER — Ambulatory Visit (HOSPITAL_COMMUNITY)
Admission: RE | Admit: 2014-12-11 | Discharge: 2014-12-11 | Disposition: A | Discharge status: Home / Self Care | Payer: Medicare Other | Source: Ambulatory Visit | Attending: Internal Medicine | Admitting: Internal Medicine

## 2014-12-11 DIAGNOSIS — C3411 Malignant neoplasm of upper lobe, right bronchus or lung: Secondary | ICD-10-CM | POA: Diagnosis not present

## 2014-12-11 DIAGNOSIS — R2681 Unsteadiness on feet: Secondary | ICD-10-CM | POA: Diagnosis not present

## 2014-12-11 DIAGNOSIS — R0602 Shortness of breath: Secondary | ICD-10-CM | POA: Diagnosis not present

## 2014-12-11 NOTE — Telephone Encounter (Signed)
I di not call t re chemo f/u due to instructions regarding calling her phone.

## 2014-12-12 ENCOUNTER — Ambulatory Visit
Admission: RE | Admit: 2014-12-12 | Discharge: 2014-12-12 | Disposition: A | Discharge status: Home / Self Care | Payer: Medicare Other | Source: Ambulatory Visit | Attending: Radiation Oncology | Admitting: Radiation Oncology

## 2014-12-12 ENCOUNTER — Encounter: Payer: Self-pay | Admitting: Radiation Oncology

## 2014-12-12 ENCOUNTER — Telehealth: Payer: Self-pay | Admitting: Cardiology

## 2014-12-12 VITALS — BP 133/62 | HR 76 | Resp 16 | Wt 142.2 lb

## 2014-12-12 DIAGNOSIS — R2681 Unsteadiness on feet: Secondary | ICD-10-CM | POA: Diagnosis not present

## 2014-12-12 DIAGNOSIS — C3411 Malignant neoplasm of upper lobe, right bronchus or lung: Secondary | ICD-10-CM

## 2014-12-12 DIAGNOSIS — R0602 Shortness of breath: Secondary | ICD-10-CM | POA: Diagnosis not present

## 2014-12-12 MED ORDER — RADIAPLEXRX EX GEL
Freq: Once | CUTANEOUS | Status: AC
  Administered 2014-12-12: 11:00:00 via TOPICAL

## 2014-12-12 NOTE — Progress Notes (Signed)
  Radiation Oncology         [phone]   Name: Marissa [name] MRN: [id number]   Date: 12/12/2014  DOB: 05-05-1933   Weekly Radiation Therapy Management    ICD-9-CM ICD-10-CM   1. Primary cancer of right upper lobe of lung 162.3 C34.11 hyaluronate sodium (RADIAPLEXRX) gel    Current Dose: 10 Gy  Planned Dose:  66 Gy  Narrative The patient presents for routine under treatment assessment. Denies pain, nausea, vomiting, or hemoptysis. Reports productive cough with thick yellow sputum and fatigue. Denies a sore throat. Marissa Lai, RN provided the patient with radiaplex gel and directed upon use. Set-up films were reviewed. The chart was checked.  Physical Findings  weight is 142 lb 3.2 oz (64.501 kg). Her blood pressure is 133/62 and her pulse is 76. Her respiration is 16.  Weight essentially stable. Nasal cannula in place with 2 L of oxygen. Pt presents to the clinic in a wheelchair.  Impression The patient is tolerating radiation.  Plan Continue treatment as planned. I explained to the patient that it would be normal if she begins to have some difficulty swallowing due to the location of her treatment field.    This document serves as a record of services personally performed by Marissa Pita, MD. It was created on his behalf by Marissa [name], a trained medical scribe. The creation of this record is based on the scribe's personal observations and the provider's statements to them. This document has been checked and approved by the attending provider.       Marissa [name], M.D.

## 2014-12-12 NOTE — Progress Notes (Signed)
Weight and vitals stable. Denies pain. Denies nausea or vomiting. Reports fatigue. Reports productive cough with thick yellow sputum. Denies hemoptysis. Oxygen therapy 2 liters via nasal cannula noted. Denies a sore throat. Provided patient with radiaplex gel and directed upon use.  BP 133/62 mmHg  Pulse 76  Resp 16  Wt 142 lb 3.2 oz (64.501 kg) Wt Readings from Last 3 Encounters:  12/12/14 142 lb 3.2 oz (64.501 kg)  12/04/14 141 lb 6.4 oz (64.139 kg)  11/27/14 141 lb 1.6 oz (64.003 kg)

## 2014-12-12 NOTE — Telephone Encounter (Signed)
New message      Pt had valve replacement 03-2014.  She needs a MRI and they need to know what kind of valve she has---they need model numbers.  Do we have that info? Pt is going thru chemo and radiation.  Her blood pressure is 134/45 with medication.  When pt takes 2 bp pills, she is "sleepy" all of the time.  Can she only take 1 bp pill ?

## 2014-12-12 NOTE — Telephone Encounter (Signed)
Called patient's daughter back. 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 Patient was in [facility] at the time. Pateint's daughter stated all patient's previous records went to her PCP. Encouraged patient's daughter is going to call her PCP.  Also patient has concerns about feeling weak and wondering if she can take her carvedilol once instead of twice daily. Patient thinks it's due to her BP 134/45 and HR 74. Informed patient that the carvedilol is for heart failure too. Encouraged patient not to take her carvedilol and lasix at the same time, maybe space it out a couple of hours. Patient is having chemo and radiation which can make the patient feel weak too. Will forward to Dr. Marlou Porch for his advisement.

## 2014-12-15 ENCOUNTER — Ambulatory Visit (HOSPITAL_BASED_OUTPATIENT_CLINIC_OR_DEPARTMENT_OTHER): Payer: Medicare Other

## 2014-12-15 ENCOUNTER — Ambulatory Visit
Admission: RE | Admit: 2014-12-15 | Discharge: 2014-12-15 | Disposition: A | Discharge status: Home / Self Care | Payer: Medicare Other | Source: Ambulatory Visit | Attending: Radiation Oncology | Admitting: Radiation Oncology

## 2014-12-15 ENCOUNTER — Other Ambulatory Visit (HOSPITAL_BASED_OUTPATIENT_CLINIC_OR_DEPARTMENT_OTHER): Payer: Medicare Other

## 2014-12-15 ENCOUNTER — Other Ambulatory Visit: Payer: Self-pay | Admitting: Neurology

## 2014-12-15 VITALS — BP 137/77 | HR 66 | Temp 98.9°F | Resp 28

## 2014-12-15 DIAGNOSIS — R0602 Shortness of breath: Secondary | ICD-10-CM | POA: Diagnosis not present

## 2014-12-15 DIAGNOSIS — Z5111 Encounter for antineoplastic chemotherapy: Secondary | ICD-10-CM

## 2014-12-15 DIAGNOSIS — C3411 Malignant neoplasm of upper lobe, right bronchus or lung: Secondary | ICD-10-CM

## 2014-12-15 DIAGNOSIS — R2681 Unsteadiness on feet: Secondary | ICD-10-CM | POA: Diagnosis not present

## 2014-12-15 LAB — COMPREHENSIVE METABOLIC PANEL (CC13)
ALT: 22 U/L (ref 0–55)
AST: 19 U/L (ref 5–34)
Albumin: 3.3 g/dL — ABNORMAL LOW (ref 3.5–5.0)
Alkaline Phosphatase: 92 U/L (ref 40–150)
Anion Gap: 10 mEq/L (ref 3–11)
BUN: 17.3 mg/dL (ref 7.0–26.0)
CALCIUM: 9.7 mg/dL (ref 8.4–10.4)
CHLORIDE: 98 meq/L (ref 98–109)
CO2: 30 meq/L — AB (ref 22–29)
CREATININE: 0.9 mg/dL (ref 0.6–1.1)
EGFR: 64 mL/min/{1.73_m2} — ABNORMAL LOW (ref >90)
GLUCOSE: 147 mg/dL — AB (ref 70–140)
Potassium: 3.9 mEq/L (ref 3.5–5.1)
SODIUM: 138 meq/L (ref 136–145)
Total Bilirubin: 1.11 mg/dL (ref 0.20–1.20)
Total Protein: 6.7 g/dL (ref 6.4–8.3)

## 2014-12-15 LAB — CBC WITH DIFFERENTIAL/PLATELET
BASO%: 0.5 % (ref 0.0–2.0)
Basophils Absolute: 0.1 10*3/uL (ref 0.0–0.1)
EOS%: 1.8 % (ref 0.0–7.0)
Eosinophils Absolute: 0.2 10*3/uL (ref 0.0–0.5)
HEMATOCRIT: 37.8 % (ref 34.8–46.6)
HGB: 12.4 g/dL (ref 11.6–15.9)
LYMPH#: 1 10*3/uL (ref 0.9–3.3)
LYMPH%: 8.7 % — ABNORMAL LOW (ref 14.0–49.7)
MCH: 27 pg (ref 25.1–34.0)
MCHC: 32.9 g/dL (ref 31.5–36.0)
MCV: 82.1 fL (ref 79.5–101.0)
MONO#: 0.7 10*3/uL (ref 0.1–0.9)
MONO%: 6.2 % (ref 0.0–14.0)
NEUT#: 9.1 10*3/uL — ABNORMAL HIGH (ref 1.5–6.5)
NEUT%: 82.8 % — AB (ref 38.4–76.8)
Platelets: 245 10*3/uL (ref 145–400)
RBC: 4.61 10*6/uL (ref 3.70–5.45)
RDW: 16.4 % — AB (ref 11.2–14.5)
WBC: 11.1 10*3/uL — ABNORMAL HIGH (ref 3.9–10.3)

## 2014-12-15 MED ORDER — SODIUM CHLORIDE 0.9 % IV SOLN
Freq: Once | INTRAVENOUS | Status: AC
  Administered 2014-12-15: 15:00:00 via INTRAVENOUS
  Filled 2014-12-15: qty 8

## 2014-12-15 MED ORDER — SODIUM CHLORIDE 0.9 % IV SOLN
Freq: Once | INTRAVENOUS | Status: AC
  Administered 2014-12-15: 15:00:00 via INTRAVENOUS

## 2014-12-15 MED ORDER — PACLITAXEL CHEMO INJECTION 300 MG/50ML
45.0000 mg/m2 | Freq: Once | INTRAVENOUS | Status: AC
  Administered 2014-12-15: 72 mg via INTRAVENOUS
  Filled 2014-12-15: qty 12

## 2014-12-15 MED ORDER — DIPHENHYDRAMINE HCL 50 MG/ML IJ SOLN
50.0000 mg | Freq: Once | INTRAMUSCULAR | Status: AC
  Administered 2014-12-15: 50 mg via INTRAVENOUS

## 2014-12-15 MED ORDER — FAMOTIDINE IN NACL 20-0.9 MG/50ML-% IV SOLN
20.0000 mg | Freq: Once | INTRAVENOUS | Status: AC
  Administered 2014-12-15: 20 mg via INTRAVENOUS

## 2014-12-15 MED ORDER — FAMOTIDINE IN NACL 20-0.9 MG/50ML-% IV SOLN
INTRAVENOUS | Status: AC
  Filled 2014-12-15: qty 50

## 2014-12-15 MED ORDER — CARBOPLATIN CHEMO INJECTION 450 MG/45ML
142.4000 mg | Freq: Once | INTRAVENOUS | Status: AC
  Administered 2014-12-15: 140 mg via INTRAVENOUS
  Filled 2014-12-15: qty 14

## 2014-12-15 MED ORDER — DIPHENHYDRAMINE HCL 50 MG/ML IJ SOLN
INTRAMUSCULAR | Status: AC
  Filled 2014-12-15: qty 1

## 2014-12-15 NOTE — Patient Instructions (Signed)
[facility] Discharge Instructions for Patients Receiving Chemotherapy  Today you received the following chemotherapy agents taxol, carboplatin  To help prevent nausea and vomiting after your treatment, we encourage you to take your nausea medication   If you develop nausea and vomiting that is not controlled by your nausea medication, call the clinic.   BELOW ARE SYMPTOMS THAT SHOULD BE REPORTED IMMEDIATELY:  *FEVER GREATER THAN 100.5 F  *CHILLS WITH OR WITHOUT FEVER  NAUSEA AND VOMITING THAT IS NOT CONTROLLED WITH YOUR NAUSEA MEDICATION  *UNUSUAL SHORTNESS OF BREATH  *UNUSUAL BRUISING OR BLEEDING  TENDERNESS IN MOUTH AND THROAT WITH OR WITHOUT PRESENCE OF ULCERS  *URINARY PROBLEMS  *BOWEL PROBLEMS  UNUSUAL RASH Items with * indicate a potential emergency and should be followed up as soon as possible.  Feel free to call the clinic you have any questions or concerns. The clinic phone number is [phone].  Please show the [facility] at check-in to the Emergency Department and triage nurse.

## 2014-12-15 NOTE — Telephone Encounter (Signed)
Agree with plan. Both mitral and aortic valve are bioprosthetic and should not be an issue with MRI to my knowledge.   Could try Metoprolol XL '50mg'$  PO QD instead of coreg BID to see if this helps with fatigue.   Candee Furbish, MD

## 2014-12-16 ENCOUNTER — Ambulatory Visit
Admission: RE | Admit: 2014-12-16 | Discharge: 2014-12-16 | Disposition: A | Discharge status: Home / Self Care | Payer: Medicare Other | Source: Ambulatory Visit | Attending: Radiation Oncology | Admitting: Radiation Oncology

## 2014-12-16 ENCOUNTER — Encounter: Payer: Self-pay | Admitting: Radiation Oncology

## 2014-12-16 VITALS — BP 137/50 | HR 80 | Temp 98.0°F | Resp 16

## 2014-12-16 DIAGNOSIS — R0602 Shortness of breath: Secondary | ICD-10-CM | POA: Diagnosis not present

## 2014-12-16 DIAGNOSIS — R2681 Unsteadiness on feet: Secondary | ICD-10-CM | POA: Diagnosis not present

## 2014-12-16 DIAGNOSIS — C3411 Malignant neoplasm of upper lobe, right bronchus or lung: Secondary | ICD-10-CM | POA: Diagnosis not present

## 2014-12-16 MED ORDER — METOPROLOL SUCCINATE ER 50 MG PO TB24: 50.0000 mg | ORAL_TABLET | Freq: Every evening | ORAL | Status: DC

## 2014-12-16 NOTE — Progress Notes (Signed)
Weight and vitals stable. Denies nausea or vomiting. Reports fatigue. Reports productive cough with thick yellow sputum. Denies hemoptysis. Oxygen therapy 2 liters via nasal cannula noted. Denies a sore throat. Provided patient with radiaplex gel and directed upon use. Reports pain in her right chest that radiates to her right ear. Patient describes this pain as an aching pain that comes and goes. Patient relates this pain to her frequent cough associated with emphysema. Patient requesting a prescription for pain medication  BP 137/50 mmHg  Pulse 80  Temp(Src) 98 F (36.7 C) (Oral)  Resp 16  Wt   SpO2 97% Wt Readings from Last 3 Encounters:  12/12/14 142 lb 3.2 oz (64.501 kg)  12/04/14 141 lb 6.4 oz (64.139 kg)  11/27/14 141 lb 1.6 oz (64.003 kg)

## 2014-12-16 NOTE — Addendum Note (Signed)
Addended by: Shellia Cleverly on: 12/16/2014 12:11 PM   Modules accepted: Orders, Medications

## 2014-12-16 NOTE — Addendum Note (Signed)
Encounter addended by: Heywood Footman, RN on: 12/16/2014 11:03 AM<BR>     Documentation filed: Medications

## 2014-12-16 NOTE — Progress Notes (Signed)
  Radiation Oncology         [phone] ________________________________  Name: Marissa [name] MRN: [id number]  Date: 12/16/2014  DOB: Nov 18, 1933  Weekly Radiation Therapy Management  Primary cancer of right upper lobe of lung   Current Dose: 14 Gy     Planned Dose:  66 Gy  Narrative . . . . . . . . The patient presents for routine under treatment assessment.                                   Denies nausea or vomiting. Reports fatigue. Reports productive cough with, thick yellow, sputum. Denies hemoptysis. Oxygen therapy 2 liters via nasal cannula noted. Denies a sore throat. The nurse, Joaquim Lai, provided the patient with radiaplex gel and directed upon use. Reports pain in her right chest that radiates to her right ear. Patient describes this pain as an aching pain that comes and goes. The pain started as a sharp pain when it first presented. She denies any shortness of breath associated with this right-sided chest pain. Patient relates this pain to her frequent cough associated with emphysema. Patient requesting a prescription for pain medication.                                  Set-up films were reviewed.                                 The chart was checked. Physical Findings. . .  oral temperature is 98 F (36.7 C). Her blood pressure is 137/50 and her pulse is 80. Her respiration is 16 and oxygen saturation is 97%.  Lungs are clear to auscultation bilaterally. Heart has regular rate and rhythm. No palpable cervical, supraclavicular, or axillary adenopathy. 2 L of oxygen via nasal cannula. Pt in a wheelchair.  Impression . . . . . . . The patient is tolerating radiation. Plan . . . . . . . . . . . . Continue treatment as planned. I advised the patient to take two Tylenol prn for pain. Will let us know if she needs something stronger  This document serves as a record of services personally performed by Gery Pray, MD. It was created on his behalf by Darcus Austin, a trained  medical scribe. The creation of this record is based on the scribe's personal observations and the provider's statements to them. This document has been checked and approved by the attending provider.  ________________________________   Blair Promise, PhD, MD

## 2014-12-16 NOTE — Telephone Encounter (Signed)
Peggy aware of information and orders.   She requests the new RX be sent into [facility].  She states understanding to stop the Carvedilol once pt has started the Metoprolol XL.  She will call back with any further questions or concerns.

## 2014-12-17 ENCOUNTER — Ambulatory Visit
Admission: RE | Admit: 2014-12-17 | Discharge: 2014-12-17 | Disposition: A | Discharge status: Home / Self Care | Payer: Medicare Other | Source: Ambulatory Visit | Attending: Radiation Oncology | Admitting: Radiation Oncology

## 2014-12-17 DIAGNOSIS — R0602 Shortness of breath: Secondary | ICD-10-CM | POA: Diagnosis not present

## 2014-12-17 DIAGNOSIS — C3411 Malignant neoplasm of upper lobe, right bronchus or lung: Secondary | ICD-10-CM | POA: Diagnosis not present

## 2014-12-17 DIAGNOSIS — R2681 Unsteadiness on feet: Secondary | ICD-10-CM | POA: Diagnosis not present

## 2014-12-18 ENCOUNTER — Ambulatory Visit
Admission: RE | Admit: 2014-12-18 | Discharge: 2014-12-18 | Disposition: A | Discharge status: Home / Self Care | Payer: Medicare Other | Source: Ambulatory Visit | Attending: Radiation Oncology | Admitting: Radiation Oncology

## 2014-12-18 DIAGNOSIS — R0602 Shortness of breath: Secondary | ICD-10-CM | POA: Diagnosis not present

## 2014-12-18 DIAGNOSIS — R2681 Unsteadiness on feet: Secondary | ICD-10-CM | POA: Diagnosis not present

## 2014-12-18 DIAGNOSIS — C3411 Malignant neoplasm of upper lobe, right bronchus or lung: Secondary | ICD-10-CM | POA: Diagnosis not present

## 2014-12-19 ENCOUNTER — Ambulatory Visit
Admission: RE | Admit: 2014-12-19 | Discharge: 2014-12-19 | Disposition: A | Discharge status: Home / Self Care | Payer: Medicare Other | Source: Ambulatory Visit | Attending: Radiation Oncology | Admitting: Radiation Oncology

## 2014-12-19 ENCOUNTER — Encounter: Payer: Self-pay | Admitting: Radiation Oncology

## 2014-12-19 VITALS — BP 134/52 | HR 68 | Temp 97.8°F | Ht 61.0 in | Wt 139.6 lb

## 2014-12-19 DIAGNOSIS — R0602 Shortness of breath: Secondary | ICD-10-CM | POA: Diagnosis not present

## 2014-12-19 DIAGNOSIS — R2681 Unsteadiness on feet: Secondary | ICD-10-CM | POA: Diagnosis not present

## 2014-12-19 DIAGNOSIS — C3411 Malignant neoplasm of upper lobe, right bronchus or lung: Secondary | ICD-10-CM | POA: Diagnosis not present

## 2014-12-19 NOTE — Progress Notes (Signed)
Marissa [name] has received 10 fractions to her right lung.  O2 at 2 liters/min with an 02 sat of 99%.  Denies any pain at this time.  No significant changes in skin in the tx field.  Continual expectoration of phlegm and states "a little bit of blood in sputum this week".  BP 134/52 mmHg  Pulse 68  Temp(Src) 97.8 F (36.6 C)  Ht '5\' 1"'$  (1.549 m)  Wt 139 lb 9.6 oz (63.322 kg)  BMI 26.39 kg/m2  SpO2 99%   Wt Readings from Last 3 Encounters:  12/19/14 139 lb 9.6 oz (63.322 kg)  12/12/14 142 lb 3.2 oz (64.501 kg)  12/04/14 141 lb 6.4 oz (64.139 kg)

## 2014-12-19 NOTE — Progress Notes (Signed)
  Radiation Oncology         [phone]   Name: Marissa [name] MRN: [id number]   Date: 12/19/2014  DOB: 07-01-1933   Weekly Radiation Therapy Management    ICD-9-CM ICD-10-CM   1. Primary cancer of right upper lobe of lung (HCC) 162.3 C34.11    Primary cancer of right upper lobe of lung  Current Dose: Reviewed/Approved Planned Dose:  66 Gy  Narrative The patient presents for routine under treatment assessment. He has completed 10 fractions.  She is noted to have O2 at 2 liters/min with an 02 sat of 99%. She denies symptoms of pain, but confirms continual expectoration of phlegm and states "a little bit of blood was in my sputum this week". Set-up films were reviewed. The chart was checked. The patient projected a healthy mental status.    Physical Findings  height is '5\' 1"'$  (1.549 m) and weight is 139 lb 9.6 oz (63.322 kg). Her temperature is 97.8 F (36.6 C). Her blood pressure is 134/52 and her pulse is 68. Her oxygen saturation is 99%.  Weight essentially stable. The patient is alert and oriented. There is no significant changes to the status of overall health to be noted at this time.  Impression Marissa [name] is a 81 year old woman presenting to clinic in regards to her primary cancer of right upper lobe of lung. The patient is tolerating radiation.   She is managing reported symptoms appropriately. The patient understands that she can access her appointments and medical records via [facility].   Plan The patient has been advised to continue treatment as planned. Healthy methods of management in regards to reported symptoms were reviewed in detail. She is advised of her follow-up appointment to take place with radiation oncology next week, as scheduled.  All vocalized questions and concerns have been addressed. If the patient develops any further questions or concerns in regards to her treatment and recovery, she has been encouraged to contact Dr. Tammi Klippel, MD.     This document  serves as a record of services personally performed by Tyler Pita, MD. It was created on his behalf by Lenn Cal, a trained medical scribe. The creation of this record is based on the scribe's personal observations and the provider's statements to them. This document has been checked and approved by the attending provider.  ______________________________________________________  Sheral Apley. Tammi Klippel, M.D.

## 2014-12-22 ENCOUNTER — Encounter: Payer: Self-pay | Admitting: *Deleted

## 2014-12-22 ENCOUNTER — Other Ambulatory Visit (HOSPITAL_BASED_OUTPATIENT_CLINIC_OR_DEPARTMENT_OTHER): Payer: Medicare Other

## 2014-12-22 ENCOUNTER — Ambulatory Visit (HOSPITAL_BASED_OUTPATIENT_CLINIC_OR_DEPARTMENT_OTHER): Payer: Medicare Other

## 2014-12-22 ENCOUNTER — Ambulatory Visit (HOSPITAL_BASED_OUTPATIENT_CLINIC_OR_DEPARTMENT_OTHER): Payer: Medicare Other | Admitting: Internal Medicine

## 2014-12-22 ENCOUNTER — Ambulatory Visit
Admission: RE | Admit: 2014-12-22 | Discharge: 2014-12-22 | Disposition: A | Discharge status: Home / Self Care | Payer: Medicare Other | Source: Ambulatory Visit | Attending: Radiation Oncology | Admitting: Radiation Oncology

## 2014-12-22 ENCOUNTER — Encounter: Payer: Self-pay | Admitting: Internal Medicine

## 2014-12-22 VITALS — BP 146/51 | HR 50 | Temp 99.1°F | Resp 18 | Ht 61.0 in | Wt 140.4 lb

## 2014-12-22 DIAGNOSIS — C3411 Malignant neoplasm of upper lobe, right bronchus or lung: Secondary | ICD-10-CM

## 2014-12-22 DIAGNOSIS — R0602 Shortness of breath: Secondary | ICD-10-CM | POA: Diagnosis not present

## 2014-12-22 DIAGNOSIS — Z5111 Encounter for antineoplastic chemotherapy: Secondary | ICD-10-CM | POA: Diagnosis present

## 2014-12-22 DIAGNOSIS — Z789 Other specified health status: Secondary | ICD-10-CM

## 2014-12-22 HISTORY — DX: Other specified health status: Z78.9

## 2014-12-22 LAB — CBC WITH DIFFERENTIAL/PLATELET
BASO%: 0.1 % (ref 0.0–2.0)
BASOS ABS: 0 10*3/uL (ref 0.0–0.1)
EOS ABS: 0.2 10*3/uL (ref 0.0–0.5)
EOS%: 2.2 % (ref 0.0–7.0)
HCT: 37.1 % (ref 34.8–46.6)
HGB: 12.1 g/dL (ref 11.6–15.9)
LYMPH%: 9.2 % — AB (ref 14.0–49.7)
MCH: 28.1 pg (ref 25.1–34.0)
MCHC: 32.6 g/dL (ref 31.5–36.0)
MCV: 86.1 fL (ref 79.5–101.0)
MONO#: 0.7 10*3/uL (ref 0.1–0.9)
MONO%: 7.4 % (ref 0.0–14.0)
NEUT%: 81.1 % — ABNORMAL HIGH (ref 38.4–76.8)
NEUTROS ABS: 7.5 10*3/uL — AB (ref 1.5–6.5)
Platelets: 207 10*3/uL (ref 145–400)
RBC: 4.31 10*6/uL (ref 3.70–5.45)
RDW: 16.4 % — AB (ref 11.2–14.5)
WBC: 9.2 10*3/uL (ref 3.9–10.3)
lymph#: 0.9 10*3/uL (ref 0.9–3.3)

## 2014-12-22 LAB — COMPREHENSIVE METABOLIC PANEL (CC13)
ALT: 30 U/L (ref 0–55)
AST: 32 U/L (ref 5–34)
Albumin: 3.1 g/dL — ABNORMAL LOW (ref 3.5–5.0)
Alkaline Phosphatase: 78 U/L (ref 40–150)
Anion Gap: 11 mEq/L (ref 3–11)
BUN: 21.1 mg/dL (ref 7.0–26.0)
CO2: 26 meq/L (ref 22–29)
Calcium: 9.6 mg/dL (ref 8.4–10.4)
Chloride: 101 mEq/L (ref 98–109)
Creatinine: 0.8 mg/dL (ref 0.6–1.1)
EGFR: 68 mL/min/{1.73_m2} — AB (ref >90)
GLUCOSE: 202 mg/dL — AB (ref 70–140)
POTASSIUM: 3.5 meq/L (ref 3.5–5.1)
SODIUM: 139 meq/L (ref 136–145)
Total Bilirubin: 0.58 mg/dL (ref 0.20–1.20)
Total Protein: 6.4 g/dL (ref 6.4–8.3)

## 2014-12-22 MED ORDER — SODIUM CHLORIDE 0.9 % IV SOLN
Freq: Once | INTRAVENOUS | Status: AC
  Administered 2014-12-22: 11:00:00 via INTRAVENOUS

## 2014-12-22 MED ORDER — DIPHENHYDRAMINE HCL 50 MG/ML IJ SOLN
INTRAMUSCULAR | Status: AC
  Filled 2014-12-22: qty 1

## 2014-12-22 MED ORDER — SODIUM CHLORIDE 0.9 % IV SOLN
142.4000 mg | Freq: Once | INTRAVENOUS | Status: AC
  Administered 2014-12-22: 140 mg via INTRAVENOUS
  Filled 2014-12-22: qty 14

## 2014-12-22 MED ORDER — PACLITAXEL CHEMO INJECTION 300 MG/50ML: 45.0000 mg/m2 | Freq: Once | INTRAVENOUS | Status: DC

## 2014-12-22 MED ORDER — DIPHENHYDRAMINE HCL 50 MG/ML IJ SOLN
50.0000 mg | Freq: Once | INTRAMUSCULAR | Status: AC
  Administered 2014-12-22: 50 mg via INTRAVENOUS

## 2014-12-22 MED ORDER — FAMOTIDINE IN NACL 20-0.9 MG/50ML-% IV SOLN
INTRAVENOUS | Status: AC
  Filled 2014-12-22: qty 50

## 2014-12-22 MED ORDER — SODIUM CHLORIDE 0.9 % IV SOLN
45.0000 mg/m2 | Freq: Once | INTRAVENOUS | Status: AC
  Administered 2014-12-22: 72 mg via INTRAVENOUS
  Filled 2014-12-22: qty 12

## 2014-12-22 MED ORDER — SODIUM CHLORIDE 0.9 % IV SOLN
Freq: Once | INTRAVENOUS | Status: AC
  Administered 2014-12-22: 11:00:00 via INTRAVENOUS
  Filled 2014-12-22: qty 8

## 2014-12-22 MED ORDER — FAMOTIDINE IN NACL 20-0.9 MG/50ML-% IV SOLN
20.0000 mg | Freq: Once | INTRAVENOUS | Status: AC
  Administered 2014-12-22: 20 mg via INTRAVENOUS

## 2014-12-22 NOTE — Progress Notes (Signed)
[facility] [phone]   [phone]  OFFICE PROGRESS NOTE  [name], MD [address] [place] [address]  DIAGNOSIS: Stage IIIA (T2a, N2, M0) non-small cell lung cancer, squamous cell carcinoma presented with recurrence on the right upper lobe in addition to mediastinal lymphadenopathy diagnosed in September 2016.  PRIOR THERAPY:None  CURRENT THERAPY: Concurrent chemoradiation with weekly carboplatin for AUC of 2 and paclitaxel 45 MG/M2. S/P 2 cycles.  INTERVAL HISTORY: Marissa [name] 81 y.o. female returns to the clinic today for follow-up visit accompanied by her daughter. The patient is feeling fine today was no specific complaints. She is tolerating her course of concurrent chemoradiation fairly well with no significant adverse effects. She denied having any significant nausea or vomiting. She has no fever or chills. She denied having any significant weight loss or night sweats. The patient has no chest pain but continues to have the baseline shortness breath with no cough or hemoptysis.  MEDICAL HISTORY: Past Medical History  Diagnosis Date  . HTN (hypertension)   . Hypercholesteremia   . CAD (coronary artery disease)     s/p CABG 03/2011  . Aortic valve disorders   . RLS (restless legs syndrome)   . A-fib ([place] [place])   . Osteopenia   . Mitral valve disease     s/p MVR 03/2011 w/ 27 mm porcine valve  . Hypothyroidism   . COPD (chronic obstructive pulmonary disease) ([place])   . CHF (congestive heart failure) ([place])   . Aortic stenosis     (s/p AVR w/ 21 mm porcine valve 03/2011  . Mastoiditis   . Myocardial infarction ([employer])     "slight"  . Dysrhythmia   . Shortness of breath     with exertion  . Pre-diabetes   . GERD (gastroesophageal reflux disease)     takes nexium prn  . Arthritis   . History of blood transfusion     Rheumatic Fever  . Lung cancer ([place])   . Pneumonia   . Diabetes mellitus without  complication (HCC)     ALLERGIES:  is allergic to coumadin and prozac.  MEDICATIONS:  Current Outpatient Prescriptions  Medication Sig Dispense Refill  . albuterol (PROVENTIL) (2.5 MG/3ML) 0.083% nebulizer solution     . alendronate (FOSAMAX) 70 MG tablet Take 70 mg by mouth once a week.     [name] aspirin EC 81 MG tablet Take 1 tablet (81 mg total) by mouth daily.    [name] atorvastatin (LIPITOR) 40 MG tablet Take 40 mg by mouth daily.     [name] CALCIUM CARBONATE PO Take 1 tablet by mouth 2 (two) times daily.    . carvedilol (COREG) 6.25 MG tablet     . digoxin (LANOXIN) 0.125 MG tablet Take 0.125 mg by mouth daily.     [name] esomeprazole (NEXIUM) 40 MG capsule Take 40 mg by mouth daily as needed (for acid reflux).     . fluticasone-salmeterol (ADVAIR HFA) 115-21 MCG/ACT inhaler Inhale 2 puffs into the lungs 2 (two) times daily.     . furosemide (LASIX) 40 MG tablet Take 20-40 mg by mouth 2 (two) times daily. Take 40 mg by mouth in the morning and take 20 mg by mouth in the evening for fluid.    [name] glimepiride (AMARYL) 1 MG tablet Take 1 mg by mouth daily with breakfast.    . Ipratropium-Albuterol (COMBIVENT) 20-100 MCG/ACT AERS respimat Inhale 1 puff into the lungs every  6 (six) hours as needed for wheezing or shortness of breath.     [name] ipratropium-albuterol (DUONEB) 0.5-2.5 (3) MG/3ML SOLN Take 3 mLs by nebulization every 6 (six) hours as needed (for shortness of breath).     [name] levothyroxine (SYNTHROID, LEVOTHROID) 100 MCG tablet Take 100 mcg by mouth daily before breakfast.     . metoprolol succinate (TOPROL-XL) 50 MG 24 hr tablet Take 1 tablet (50 mg total) by mouth every evening. 90 tablet 3  . potassium chloride SA (K-DUR,KLOR-CON) 20 MEQ tablet Take 20 mEq by mouth daily.     . prochlorperazine (COMPAZINE) 10 MG tablet Take 1 tablet (10 mg total) by mouth every 6 (six) hours as needed for nausea or vomiting. 30 tablet 0  . rOPINIRole (REQUIP) 4 MG tablet Take 4 mg by mouth at bedtime.    . vitamin C  (ASCORBIC ACID) 500 MG tablet Take 500 mg by mouth daily.    . Wound Cleansers (RADIAPLEX EX) Apply topically.     No current facility-administered medications for this visit.    SURGICAL HISTORY:  Past Surgical History  Procedure Laterality Date  . Mitral valve replacement      1990's  . Laparoscopic cholecystectomy    . Aortic valve replacement    . Tonsillectomy    . Coronary artery bypass graft  03/2011    1990's also  . Video bronchoscopy with endobronchial navigation N/A 01/06/2014    Procedure: VIDEO BRONCHOSCOPY WITH ENDOBRONCHIAL NAVIGATION;  Surgeon: [name], MD;  Location: [facility];  Service: Thoracic;  Laterality: N/A;  . Video bronchoscopy with endobronchial ultrasound N/A 01/06/2014    Procedure: VIDEO BRONCHOSCOPY WITH ENDOBRONCHIAL ULTRASOUND;  Surgeon: [name], MD;  Location: [facility];  Service: Thoracic;  Laterality: N/A;  . Video bronchoscopy with endobronchial navigation N/A 11/21/2014    Procedure: VIDEO BRONCHOSCOPY WITH ENDOBRONCHIAL NAVIGATION;  Surgeon: [name], MD;  Location: [facility];  Service: Thoracic;  Laterality: N/A;  . Video bronchoscopy with endobronchial ultrasound N/A 11/21/2014    Procedure: VIDEO BRONCHOSCOPY WITH ENDOBRONCHIAL ULTRASOUND;  Surgeon: [name], MD;  Location: [facility];  Service: Thoracic;  Laterality: N/A;    REVIEW OF SYSTEMS:  A comprehensive review of systems was negative except for: Respiratory: positive for dyspnea on exertion   PHYSICAL EXAMINATION: General appearance: alert, cooperative, fatigued and no distress Head: Normocephalic, without obvious abnormality, atraumatic Neck: no adenopathy, no JVD, supple, symmetrical, trachea midline and thyroid not enlarged, symmetric, no tenderness/mass/nodules Lymph nodes: Cervical, supraclavicular, and axillary nodes normal. Resp: clear to auscultation bilaterally Back: symmetric, no curvature. ROM normal. No CVA tenderness. Cardio: regular rate and  rhythm, S1, S2 normal, no murmur, click, rub or gallop GI: soft, non-tender; bowel sounds normal; no masses,  no organomegaly Extremities: extremities normal, atraumatic, no cyanosis or edema  ECOG PERFORMANCE STATUS: 1 - Symptomatic but completely ambulatory  There were no vitals taken for this visit.  LABORATORY DATA: Lab Results  Component Value Date   WBC 9.2 12/22/2014   HGB 12.1 12/22/2014   HCT 37.1 12/22/2014   MCV 86.1 12/22/2014   PLT 207 12/22/2014      Chemistry      Component Value Date/Time   NA 139 12/22/2014 0845   NA 139 11/21/2014 0638   NA 137 04/17/2014 0409   K 3.5 12/22/2014 0845   K 3.8 11/21/2014 0638   K 2.8* 04/18/2014 0422   CL 100* 11/21/2014 0638   CL 98 04/17/2014 0409  CO2 26 12/22/2014 0845   CO2 32 11/21/2014 0638   CO2 30 04/17/2014 0409   BUN 21.1 12/22/2014 0845   BUN 21* 11/21/2014 0638   BUN 19* 04/17/2014 0409   CREATININE 0.8 12/22/2014 0845   CREATININE 0.88 11/21/2014 0638   CREATININE 1.08 04/17/2014 0409      Component Value Date/Time   CALCIUM 9.6 12/22/2014 0845   CALCIUM 9.3 11/21/2014 0638   CALCIUM 9.0 04/17/2014 0409   ALKPHOS 78 12/22/2014 0845   ALKPHOS 80 11/21/2014 0638   AST 32 12/22/2014 0845   AST 22 11/21/2014 0638   ALT 30 12/22/2014 0845   ALT 20 11/21/2014 0638   BILITOT 0.58 12/22/2014 0845   BILITOT 0.8 11/21/2014 1610       RADIOGRAPHIC STUDIES: No results found.  ASSESSMENT AND PLAN: This is a very pleasant 81 years old white female with recurrent non-small cell lung cancer presented as a stage IIIa, squamous cell carcinoma. The patient is currently undergoing a course of concurrent chemoradiation with weekly carboplatin and paclitaxel. She is tolerating her treatment well. I recommended for her to proceed with cycle #3 today as a scheduled. The patient would come back for follow-up visit in 2 weeks for reevaluation and management of any adverse effect of her treatment. CODE STATUS was  discussed with the patient and her daughter and she requested a full CODE STATUS at this point but she would like prolonged supportive care if she is in a vegetative state. The patient was advised to call immediately if she has any concerning symptoms in the interval. The patient voices understanding of current disease status and treatment options and is in agreement with the current care plan.  All questions were answered. The patient knows to call the clinic with any problems, questions or concerns. We can certainly see the patient much sooner if necessary.  Disclaimer: This note was dictated with voice recognition software. Similar sounding words can inadvertently be transcribed and may not be corrected upon review.

## 2014-12-22 NOTE — Progress Notes (Signed)
Oncology Nurse Navigator Documentation  Oncology Nurse Navigator Flowsheets 12/22/2014  Navigator Encounter Type Clinic/MDC/spoke with patient today to see if they had any financial needs.  Patient and daughter stated no needs at this time.  They have already filled out lung cancer initiative gas card application.    Patient Visit Type Medonc  Treatment Phase Treatment  Barriers/Navigation Needs Financial

## 2014-12-22 NOTE — Patient Instructions (Signed)
Cancer Center Discharge Instructions for Patients Receiving Chemotherapy  Today you received the following chemotherapy agents Taxol and Carboplatin. To help prevent nausea and vomiting after your treatment, we encourage you to take your nausea medication as directed.  If you develop nausea and vomiting that is not controlled by your nausea medication, call the clinic.   BELOW ARE SYMPTOMS THAT SHOULD BE REPORTED IMMEDIATELY:  *FEVER GREATER THAN 100.5 F  *CHILLS WITH OR WITHOUT FEVER  NAUSEA AND VOMITING THAT IS NOT CONTROLLED WITH YOUR NAUSEA MEDICATION  *UNUSUAL SHORTNESS OF BREATH  *UNUSUAL BRUISING OR BLEEDING  TENDERNESS IN MOUTH AND THROAT WITH OR WITHOUT PRESENCE OF ULCERS  *URINARY PROBLEMS  *BOWEL PROBLEMS  UNUSUAL RASH Items with * indicate a potential emergency and should be followed up as soon as possible.  Feel free to call the clinic you have any questions or concerns. The clinic phone number is [phone].  Please show the CHEMO ALERT CARD at check-in to the Emergency Department and triage nurse.    

## 2014-12-23 ENCOUNTER — Ambulatory Visit
Admission: RE | Admit: 2014-12-23 | Discharge: 2014-12-23 | Disposition: A | Discharge status: Home / Self Care | Payer: Medicare Other | Source: Ambulatory Visit | Attending: Radiation Oncology | Admitting: Radiation Oncology

## 2014-12-23 DIAGNOSIS — C3411 Malignant neoplasm of upper lobe, right bronchus or lung: Secondary | ICD-10-CM | POA: Diagnosis not present

## 2014-12-24 ENCOUNTER — Ambulatory Visit
Admission: RE | Admit: 2014-12-24 | Discharge: 2014-12-24 | Disposition: A | Discharge status: Home / Self Care | Payer: Medicare Other | Source: Ambulatory Visit | Attending: Radiation Oncology | Admitting: Radiation Oncology

## 2014-12-24 DIAGNOSIS — C3411 Malignant neoplasm of upper lobe, right bronchus or lung: Secondary | ICD-10-CM | POA: Diagnosis not present

## 2014-12-25 ENCOUNTER — Encounter (HOSPITAL_COMMUNITY): Payer: Self-pay | Admitting: *Deleted

## 2014-12-25 ENCOUNTER — Ambulatory Visit
Admission: RE | Admit: 2014-12-25 | Discharge: 2014-12-25 | Disposition: A | Discharge status: Home / Self Care | Payer: Medicare Other | Source: Ambulatory Visit | Attending: Radiation Oncology | Admitting: Radiation Oncology

## 2014-12-25 ENCOUNTER — Inpatient Hospital Stay (HOSPITAL_COMMUNITY)
Admission: EM | Admit: 2014-12-25 | Discharge: 2014-12-29 | DRG: 481 | Disposition: A | Discharge status: Skilled Nursing Facility | Payer: Medicare Other | Attending: Internal Medicine | Admitting: Internal Medicine

## 2014-12-25 ENCOUNTER — Other Ambulatory Visit: Payer: Self-pay

## 2014-12-25 ENCOUNTER — Emergency Department (HOSPITAL_COMMUNITY): Payer: Medicare Other

## 2014-12-25 ENCOUNTER — Encounter: Payer: Self-pay | Admitting: Radiation Oncology

## 2014-12-25 VITALS — BP 124/47 | HR 73 | Temp 98.0°F | Resp 20 | Wt 139.8 lb

## 2014-12-25 DIAGNOSIS — I482 Chronic atrial fibrillation: Secondary | ICD-10-CM | POA: Diagnosis not present

## 2014-12-25 DIAGNOSIS — S72002A Fracture of unspecified part of neck of left femur, initial encounter for closed fracture: Secondary | ICD-10-CM | POA: Diagnosis present

## 2014-12-25 DIAGNOSIS — Z7982 Long term (current) use of aspirin: Secondary | ICD-10-CM

## 2014-12-25 DIAGNOSIS — E039 Hypothyroidism, unspecified: Secondary | ICD-10-CM | POA: Diagnosis present

## 2014-12-25 DIAGNOSIS — I252 Old myocardial infarction: Secondary | ICD-10-CM

## 2014-12-25 DIAGNOSIS — Z419 Encounter for procedure for purposes other than remedying health state, unspecified: Secondary | ICD-10-CM

## 2014-12-25 DIAGNOSIS — G2581 Restless legs syndrome: Secondary | ICD-10-CM | POA: Diagnosis present

## 2014-12-25 DIAGNOSIS — M1990 Unspecified osteoarthritis, unspecified site: Secondary | ICD-10-CM | POA: Diagnosis not present

## 2014-12-25 DIAGNOSIS — M21252 Flexion deformity, left hip: Secondary | ICD-10-CM | POA: Diagnosis not present

## 2014-12-25 DIAGNOSIS — S72142A Displaced intertrochanteric fracture of left femur, initial encounter for closed fracture: Secondary | ICD-10-CM | POA: Diagnosis present

## 2014-12-25 DIAGNOSIS — E7800 Pure hypercholesterolemia, unspecified: Secondary | ICD-10-CM | POA: Diagnosis present

## 2014-12-25 DIAGNOSIS — S72142D Displaced intertrochanteric fracture of left femur, subsequent encounter for closed fracture with routine healing: Secondary | ICD-10-CM | POA: Diagnosis not present

## 2014-12-25 DIAGNOSIS — J449 Chronic obstructive pulmonary disease, unspecified: Secondary | ICD-10-CM | POA: Diagnosis present

## 2014-12-25 DIAGNOSIS — I2510 Atherosclerotic heart disease of native coronary artery without angina pectoris: Secondary | ICD-10-CM | POA: Diagnosis present

## 2014-12-25 DIAGNOSIS — E119 Type 2 diabetes mellitus without complications: Secondary | ICD-10-CM | POA: Diagnosis present

## 2014-12-25 DIAGNOSIS — C3410 Malignant neoplasm of upper lobe, unspecified bronchus or lung: Secondary | ICD-10-CM | POA: Diagnosis present

## 2014-12-25 DIAGNOSIS — I10 Essential (primary) hypertension: Secondary | ICD-10-CM | POA: Diagnosis not present

## 2014-12-25 DIAGNOSIS — D62 Acute posthemorrhagic anemia: Secondary | ICD-10-CM | POA: Diagnosis not present

## 2014-12-25 DIAGNOSIS — Z9981 Dependence on supplemental oxygen: Secondary | ICD-10-CM

## 2014-12-25 DIAGNOSIS — K219 Gastro-esophageal reflux disease without esophagitis: Secondary | ICD-10-CM | POA: Diagnosis present

## 2014-12-25 DIAGNOSIS — M8580 Other specified disorders of bone density and structure, unspecified site: Secondary | ICD-10-CM | POA: Diagnosis present

## 2014-12-25 DIAGNOSIS — Z953 Presence of xenogenic heart valve: Secondary | ICD-10-CM

## 2014-12-25 DIAGNOSIS — I4891 Unspecified atrial fibrillation: Secondary | ICD-10-CM | POA: Diagnosis present

## 2014-12-25 DIAGNOSIS — Z951 Presence of aortocoronary bypass graft: Secondary | ICD-10-CM | POA: Diagnosis not present

## 2014-12-25 DIAGNOSIS — I359 Nonrheumatic aortic valve disorder, unspecified: Secondary | ICD-10-CM | POA: Diagnosis not present

## 2014-12-25 DIAGNOSIS — W182XXA Fall in (into) shower or empty bathtub, initial encounter: Secondary | ICD-10-CM | POA: Diagnosis present

## 2014-12-25 DIAGNOSIS — S72132B Displaced apophyseal fracture of left femur, initial encounter for open fracture type I or II: Secondary | ICD-10-CM | POA: Diagnosis not present

## 2014-12-25 DIAGNOSIS — I509 Heart failure, unspecified: Secondary | ICD-10-CM | POA: Diagnosis present

## 2014-12-25 DIAGNOSIS — M6281 Muscle weakness (generalized): Secondary | ICD-10-CM | POA: Diagnosis not present

## 2014-12-25 DIAGNOSIS — I059 Rheumatic mitral valve disease, unspecified: Secondary | ICD-10-CM | POA: Diagnosis present

## 2014-12-25 DIAGNOSIS — Z5189 Encounter for other specified aftercare: Secondary | ICD-10-CM | POA: Diagnosis not present

## 2014-12-25 DIAGNOSIS — Z87891 Personal history of nicotine dependence: Secondary | ICD-10-CM | POA: Diagnosis not present

## 2014-12-25 DIAGNOSIS — S79912A Unspecified injury of left hip, initial encounter: Secondary | ICD-10-CM | POA: Diagnosis not present

## 2014-12-25 DIAGNOSIS — S3690XA Unspecified injury of unspecified intra-abdominal organ, initial encounter: Secondary | ICD-10-CM | POA: Diagnosis not present

## 2014-12-25 DIAGNOSIS — I080 Rheumatic disorders of both mitral and aortic valves: Secondary | ICD-10-CM | POA: Diagnosis not present

## 2014-12-25 DIAGNOSIS — J811 Chronic pulmonary edema: Secondary | ICD-10-CM | POA: Diagnosis not present

## 2014-12-25 DIAGNOSIS — C3411 Malignant neoplasm of upper lobe, right bronchus or lung: Secondary | ICD-10-CM | POA: Diagnosis not present

## 2014-12-25 DIAGNOSIS — I110 Hypertensive heart disease with heart failure: Secondary | ICD-10-CM | POA: Diagnosis present

## 2014-12-25 DIAGNOSIS — Z9181 History of falling: Secondary | ICD-10-CM | POA: Diagnosis not present

## 2014-12-25 DIAGNOSIS — M25552 Pain in left hip: Secondary | ICD-10-CM | POA: Diagnosis not present

## 2014-12-25 DIAGNOSIS — W19XXXA Unspecified fall, initial encounter: Secondary | ICD-10-CM

## 2014-12-25 LAB — PROTIME-INR
INR: 0.98 (ref 0.00–1.49)
PROTHROMBIN TIME: 13.2 s (ref 11.6–15.2)

## 2014-12-25 LAB — CBC WITH DIFFERENTIAL/PLATELET
BASOS PCT: 0 %
Basophils Absolute: 0 10*3/uL (ref 0.0–0.1)
EOS ABS: 0.1 10*3/uL (ref 0.0–0.7)
Eosinophils Relative: 1 %
HCT: 41.1 % (ref 36.0–46.0)
HEMOGLOBIN: 13.3 g/dL (ref 12.0–15.0)
LYMPHS ABS: 0.8 10*3/uL (ref 0.7–4.0)
Lymphocytes Relative: 7 %
MCH: 27.9 pg (ref 26.0–34.0)
MCHC: 32.4 g/dL (ref 30.0–36.0)
MCV: 86.2 fL (ref 78.0–100.0)
Monocytes Absolute: 0.7 10*3/uL (ref 0.1–1.0)
Monocytes Relative: 7 %
NEUTROS PCT: 85 %
Neutro Abs: 9.3 10*3/uL — ABNORMAL HIGH (ref 1.7–7.7)
Platelets: 227 10*3/uL (ref 150–400)
RBC: 4.77 MIL/uL (ref 3.87–5.11)
RDW: 16.9 % — ABNORMAL HIGH (ref 11.5–15.5)
WBC: 10.9 10*3/uL — AB (ref 4.0–10.5)

## 2014-12-25 LAB — BASIC METABOLIC PANEL
ANION GAP: 10 (ref 5–15)
BUN: 32 mg/dL — ABNORMAL HIGH (ref 6–20)
CALCIUM: 8.8 mg/dL — AB (ref 8.9–10.3)
CHLORIDE: 96 mmol/L — AB (ref 101–111)
CO2: 32 mmol/L (ref 22–32)
CREATININE: 1.13 mg/dL — AB (ref 0.44–1.00)
GFR calc non Af Amer: 45 mL/min — ABNORMAL LOW (ref >60)
GFR, EST AFRICAN AMERICAN: 52 mL/min — AB (ref >60)
Glucose, Bld: 178 mg/dL — ABNORMAL HIGH (ref 65–99)
Potassium: 3.8 mmol/L (ref 3.5–5.1)
SODIUM: 138 mmol/L (ref 135–145)

## 2014-12-25 LAB — TYPE AND SCREEN
ABO/RH(D): A POS
ANTIBODY SCREEN: NEGATIVE

## 2014-12-25 MED ORDER — MORPHINE SULFATE (PF) 4 MG/ML IV SOLN
4.0000 mg | INTRAVENOUS | Status: DC | PRN
  Administered 2014-12-25 – 2014-12-26 (×2): 4 mg via INTRAVENOUS
  Filled 2014-12-25 (×2): qty 1

## 2014-12-25 NOTE — ED Notes (Signed)
EKG given to [facility]. For review.

## 2014-12-25 NOTE — Progress Notes (Addendum)
weekly rad txs 14/33 completed,. Productive cough congestive   Yellowish white phelgm at times, eating better but some foods still get stuck she chews more , no pain or nausea, mild fatigue, mild sob, 98 % on 2 liters n/c, in w/c, yellow fall risk bracelet on right wrist, no skin changes on chest ,uses baifine cream daily 11:49 AM BP 124/47 mmHg  Pulse 73  Temp(Src) 98 F (36.7 C) (Oral)  Resp 20  Wt 139 lb 12.8 oz (63.413 kg)  SpO2 98%  Wt Readings from Last 3 Encounters:  12/25/14 139 lb 12.8 oz (63.413 kg)  12/22/14 140 lb 6.4 oz (63.685 kg)  12/19/14 139 lb 9.6 oz (63.322 kg)

## 2014-12-25 NOTE — ED Notes (Signed)
Per EMS, pt from home, pt was taking a shower, slipped and fell.  C/o L hip pain.  No shortening or external rotation noted at this time.  Pt denies LOC.  Pt is A&Ox 4.

## 2014-12-25 NOTE — ED Provider Notes (Signed)
CSN: [id number]     Arrival date & time 12/25/14  2155 History   First MD Initiated Contact with Patient 12/25/14 2206     Chief Complaint  Patient presents with  . Fall  . Hip Pain      HPI Patient presents to the emergency department complaining of left hip pain after falling in the shower this evening.  She did not strike her head.  She reports no headache or loss consciousness.  She denies neck pain.  She denies chest pain.  No abdominal pain.  No back pain.  She reports all of her pain is focused in her left hip.  She does not use anticoagulants.  She has a history of congestive heart failure, oxygen dependent COPD.  She was in her normal state of health earlier today.  She does not have an orthopedic surgeon   Past Medical History  Diagnosis Date  . HTN (hypertension)   . Hypercholesteremia   . CAD (coronary artery disease)     s/p CABG 03/2011  . Aortic valve disorders   . RLS (restless legs syndrome)   . A-fib ([place])   . Osteopenia   . Mitral valve disease     s/p MVR 03/2011 w/ 27 mm porcine valve  . Hypothyroidism   . COPD (chronic obstructive pulmonary disease) ([facility])   . CHF (congestive heart failure) ([facility])   . Aortic stenosis     (s/p AVR w/ 21 mm porcine valve 03/2011  . Mastoiditis   . Myocardial infarction ([facility])     "slight"  . Dysrhythmia   . Shortness of breath     with exertion  . Pre-diabetes   . GERD (gastroesophageal reflux disease)     takes nexium prn  . Arthritis   . History of blood transfusion     Rheumatic Fever  . Lung cancer ([place])   . Pneumonia   . Diabetes mellitus without complication ([facility] [place])   . Full code status 12/22/2014  . Full code status 12/22/2014   Past Surgical History  Procedure Laterality Date  . Mitral valve replacement      1990's  . Laparoscopic cholecystectomy    . Aortic valve replacement    . Tonsillectomy    . Coronary artery bypass graft  03/2011    1990's also  . Video bronchoscopy with endobronchial navigation N/A  01/06/2014    Procedure: VIDEO BRONCHOSCOPY WITH ENDOBRONCHIAL NAVIGATION;  Surgeon: [name], MD;  Location: [facility];  Service: Thoracic;  Laterality: N/A;  . Video bronchoscopy with endobronchial ultrasound N/A 01/06/2014    Procedure: VIDEO BRONCHOSCOPY WITH ENDOBRONCHIAL ULTRASOUND;  Surgeon: [name], MD;  Location: [facility];  Service: Thoracic;  Laterality: N/A;  . Video bronchoscopy with endobronchial navigation N/A 11/21/2014    Procedure: VIDEO BRONCHOSCOPY WITH ENDOBRONCHIAL NAVIGATION;  Surgeon: [name], MD;  Location: [facility];  Service: Thoracic;  Laterality: N/A;  . Video bronchoscopy with endobronchial ultrasound N/A 11/21/2014    Procedure: VIDEO BRONCHOSCOPY WITH ENDOBRONCHIAL ULTRASOUND;  Surgeon: [name], MD;  Location: [facility];  Service: Thoracic;  Laterality: N/A;   Family History  Problem Relation Age of Onset  . Adopted: Yes  . Cancer Brother     lung  . Heart attack Neg Hx   . Stroke Neg Hx    Social History  Substance Use Topics  . Smoking status: Former Smoker -- 0.50 packs/day for 30 years    Types: Cigarettes  . Smokeless tobacco: Never  Used  . Alcohol Use: No   OB History    No data available     Review of Systems  All other systems reviewed and are negative.     Allergies  Coumadin and Prozac  Home Medications   Prior to Admission medications   Medication Sig Start Date End Date Taking? Authorizing Provider  alendronate (FOSAMAX) 70 MG tablet Take 70 mg by mouth once a week.  09/16/14  Yes Historical Provider, MD  aspirin EC 81 MG tablet Take 1 tablet (81 mg total) by mouth daily. 12/11/13  Yes Scott T Kathlen Mody, PA-C  atorvastatin (LIPITOR) 40 MG tablet Take 40 mg by mouth daily.    Yes Historical Provider, MD  CALCIUM CARBONATE PO Take 1 tablet by mouth 2 (two) times daily.   Yes Historical Provider, MD  digoxin (LANOXIN) 0.125 MG tablet Take 0.125 mg by mouth daily.    Yes Historical Provider, MD   esomeprazole (NEXIUM) 40 MG capsule Take 40 mg by mouth daily as needed (for acid reflux).    Yes Historical Provider, MD  fluticasone-salmeterol (ADVAIR HFA) 115-21 MCG/ACT inhaler Inhale 2 puffs into the lungs 2 (two) times daily.    Yes Historical Provider, MD  furosemide (LASIX) 40 MG tablet Take 20-40 mg by mouth 2 (two) times daily. Take 40 mg by mouth in the morning and take 20 mg by mouth in the evening for fluid.   Yes Historical Provider, MD  glimepiride (AMARYL) 1 MG tablet Take 1 mg by mouth daily with breakfast.   Yes Historical Provider, MD  Ipratropium-Albuterol (COMBIVENT) 20-100 MCG/ACT AERS respimat Inhale 1 puff into the lungs every 6 (six) hours as needed for wheezing or shortness of breath.    Yes Historical Provider, MD  ipratropium-albuterol (DUONEB) 0.5-2.5 (3) MG/3ML SOLN Take 3 mLs by nebulization every 6 (six) hours as needed (for shortness of breath).    Yes Historical Provider, MD  levothyroxine (SYNTHROID, LEVOTHROID) 100 MCG tablet Take 100 mcg by mouth daily before breakfast.    Yes Historical Provider, MD  metoprolol succinate (TOPROL-XL) 50 MG 24 hr tablet Take 1 tablet (50 mg total) by mouth every evening. 12/16/14  Yes Jerline Pain, MD  potassium chloride SA (K-DUR,KLOR-CON) 20 MEQ tablet Take 20 mEq by mouth daily.    Yes Historical Provider, MD  prochlorperazine (COMPAZINE) 10 MG tablet Take 1 tablet (10 mg total) by mouth every 6 (six) hours as needed for nausea or vomiting. 12/09/14  Yes Curt Bears, MD  rOPINIRole (REQUIP) 4 MG tablet Take 4 mg by mouth at bedtime.   Yes Historical Provider, MD  vitamin C (ASCORBIC ACID) 500 MG tablet Take 500 mg by mouth daily.   Yes Historical Provider, MD  Wound Cleansers (RADIAPLEX EX) Apply 1 application topically daily as needed (rain).    Yes Historical Provider, MD   BP 129/80 mmHg  Pulse 76  Temp(Src) 98.8 F (37.1 C) (Oral)  Resp 16  SpO2 96% Physical Exam  Constitutional: She is oriented to person, place,  and time. She appears well-developed and well-nourished. No distress.  HENT:  Head: Normocephalic and atraumatic.  Eyes: EOM are normal.  Neck: Normal range of motion.  C-spine nontender.  Cardiovascular: Normal rate, regular rhythm and normal heart sounds.   Pulmonary/Chest: Effort normal and breath sounds normal.  Abdominal: Soft. She exhibits no distension. There is no tenderness.  Musculoskeletal: Normal range of motion.  Limited range of motion of left hip secondary to pain.  No obvious  shortening or external rotation.  Normal pulses in left foot.  Full range of motion of right hip, right knee.  Full range of motion bilateral shoulders, elbows, wrists.  Neurological: She is alert and oriented to person, place, and time.  Skin: Skin is warm and dry.  Psychiatric: She has a normal mood and affect. Judgment normal.  Nursing note and vitals reviewed.   ED Course  Procedures (including critical care time) Labs Review Labs Reviewed  CBC WITH DIFFERENTIAL/PLATELET - Abnormal; Notable for the following:    WBC 10.9 (*)    RDW 16.9 (*)    Neutro Abs 9.3 (*)    All other components within normal limits  BASIC METABOLIC PANEL  PROTIME-INR  TYPE AND SCREEN    Imaging Review Dg Chest 1 View  12/25/2014   CLINICAL DATA:  LEFT hip fracture.  EXAM: CHEST 1 VIEW  COMPARISON:  Chest radiograph November 21, 2014 and CT chest November 20, 2014.  FINDINGS: Masslike consolidation RIGHT upper lobe, decreased from prior study. Similar pulmonary vascular congestion and fullness of the RIGHT hilum corresponding to lymphadenopathy on recent CT chest. The cardiac silhouette is mildly enlarged. Status post median sternotomy for cardiac valve replacement and CABG. Similar LEFT lung base atelectasis/ scarring. No pleural effusion. No pneumothorax. Partially imaged screw in RIGHT humeral head.  IMPRESSION: Improving RIGHT upper lobe consolidation/mass, recommend continued surveillance.  Mild cardiomegaly,  pulmonary vascular congestion.   Electronically Signed   By: Elon Alas M.D.   On: 12/25/2014 22:42   Dg Hip Unilat With Pelvis 2-3 Views Left  12/25/2014   CLINICAL DATA:  Fall  EXAM: DG HIP (WITH OR WITHOUT PELVIS) 2-3V LEFT  COMPARISON:  None.  FINDINGS: Comminuted intertrochanteric left femur fracture. Lesser trochanter is displaced. Greater trochanter is also displaced. Slight varus deformity. Postop changes in the pelvis. Advanced lumbar spine degenerative change.  IMPRESSION: Comminuted left intertrochanteric femur fracture.   Electronically Signed   By: Marybelle Killings M.D.   On: 12/25/2014 22:39   I have personally reviewed and evaluated these images and lab results as part of my medical decision-making.   EKG Interpretation   Date/Time:  Thursday December 25 2014 22:53:03 EDT Ventricular Rate:  87 PR Interval:    QRS Duration: 120 QT Interval:  408 QTC Calculation: 491 R Axis:   88 Text Interpretation:  Atrial fibrillation Probable left ventricular  hypertrophy ST depr, consider ischemia, inferior leads Borderline  prolonged QT interval nonspecific st changes Confirmed by Overton Boggus  MD,  Alicja Everitt ([id number]) on 12/25/2014 10:57:49 PM      MDM   Final diagnoses:  Closed left hip fracture, initial encounter ([place])  Fall, initial encounter    Orthopedics: Dr Mayer Camel Request transfer to [facility].  Request nothing by mouth after midnight.  Suspects he will operate tomorrow afternoon.  Hospitalist admission.  C-spine nontender.  No head injury.  No other complaints.  Normal state health earlier today.  No use of anticoagulants.  Patient does have oxygen dependent COPD    Jola Schmidt, MD 12/25/14 2303

## 2014-12-25 NOTE — Progress Notes (Signed)
  Radiation Oncology         [phone]   Name: ADREANA COULL MRN: [id number]   Date: 12/25/2014  DOB: Aug 06, 1933   Weekly Radiation Therapy Management    ICD-9-CM ICD-10-CM   1. Primary cancer of right upper lobe of lung (HCC) 162.3 C34.11    Primary cancer of right upper lobe of lung  Current Dose: 28 Reviewed/Approved Planned Dose:  66 Gy  Narrative The patient presents for routine under treatment assessment. He has completed 14 fractions.   She presents with a productive cough, congestive, with yellowish white phelgm at times. She is eating better but some foods still get stuck. To address this issue, she chews more. She denies symptoms of pain or nausea. She reports symptoms of mild fatigue, mild sob. She presents with 98 % on 2 liters n/c, in w/c. She is wearing a yellow fall risk bracelet on right wrist. She reports using baifine cream daily. "She's sleeping better at night and breathing better and energy is better," the patient's daughter stated. The patient projected a healthy mental status was accompanied by her daughter for today's radiation oncology appointment. Her ambulatory status was wheelchair bound.   Physical Findings  weight is 139 lb 12.8 oz (63.413 kg). Her oral temperature is 98 F (36.7 C). Her blood pressure is 124/47 and her pulse is 73. Her respiration is 20 and oxygen saturation is 98%.  Weight essentially stable. The patient is alert and oriented. There is no significant changes to the status of overall health to be noted at this time.  Impression Manda Holstad is a 81 year old woman presenting to clinic in regards to her primary cancer of right upper lobe of lung. The patient is tolerating radiation.   She is managing reported symptoms appropriately. The patient understands that she can access her appointments and medical records via [place].   Plan The patient has been advised to continue treatment as planned. Healthy methods of management in regards to  reported symptoms were reviewed in detail. She is advised of her follow-up appointment to take place with radiation oncology next week, as scheduled.  All vocalized questions and concerns have been addressed. If the patient develops any further questions or concerns in regards to her treatment and recovery, she has been encouraged to contact Dr. Tammi Klippel, MD.     This document serves as a record of services personally performed by Tyler Pita, MD. It was created on his behalf by Lenn Cal, a trained medical scribe. The creation of this record is based on the scribe's personal observations and the provider's statements to them. This document has been checked and approved by the attending provider.  ______________________________________________________  Sheral Apley. Tammi Klippel, M.D.

## 2014-12-26 ENCOUNTER — Encounter: Payer: Self-pay | Admitting: Radiation Oncology

## 2014-12-26 ENCOUNTER — Inpatient Hospital Stay (HOSPITAL_COMMUNITY): Payer: Medicare Other | Admitting: Anesthesiology

## 2014-12-26 ENCOUNTER — Inpatient Hospital Stay (HOSPITAL_COMMUNITY): Payer: Medicare Other

## 2014-12-26 ENCOUNTER — Ambulatory Visit
Admit: 2014-12-26 | Discharge: 2014-12-26 | Disposition: A | Discharge status: Home / Self Care | Payer: Medicare Other | Attending: Radiation Oncology | Admitting: Radiation Oncology

## 2014-12-26 ENCOUNTER — Telehealth: Payer: Self-pay | Admitting: *Deleted

## 2014-12-26 ENCOUNTER — Encounter (HOSPITAL_COMMUNITY): Payer: Self-pay | Admitting: Internal Medicine

## 2014-12-26 ENCOUNTER — Ambulatory Visit: Admission: RE | Admit: 2014-12-26 | Payer: Medicare Other | Source: Ambulatory Visit

## 2014-12-26 ENCOUNTER — Encounter (HOSPITAL_COMMUNITY)
Admission: EM | Disposition: A | Discharge status: Skilled Nursing Facility | Payer: Self-pay | Source: Home / Self Care | Attending: Internal Medicine

## 2014-12-26 DIAGNOSIS — I4891 Unspecified atrial fibrillation: Secondary | ICD-10-CM

## 2014-12-26 DIAGNOSIS — E119 Type 2 diabetes mellitus without complications: Secondary | ICD-10-CM

## 2014-12-26 HISTORY — PX: COMPRESSION HIP SCREW: SHX1386

## 2014-12-26 LAB — CBC
HEMATOCRIT: 34.9 % — AB (ref 36.0–46.0)
HEMOGLOBIN: 11.1 g/dL — AB (ref 12.0–15.0)
MCH: 27.5 pg (ref 26.0–34.0)
MCHC: 31.8 g/dL (ref 30.0–36.0)
MCV: 86.4 fL (ref 78.0–100.0)
Platelets: 176 10*3/uL (ref 150–400)
RBC: 4.04 MIL/uL (ref 3.87–5.11)
RDW: 17.2 % — ABNORMAL HIGH (ref 11.5–15.5)
WBC: 9.3 10*3/uL (ref 4.0–10.5)

## 2014-12-26 LAB — COMPREHENSIVE METABOLIC PANEL
ALBUMIN: 2.7 g/dL — AB (ref 3.5–5.0)
ALK PHOS: 108 U/L (ref 38–126)
ALT: 170 U/L — ABNORMAL HIGH (ref 14–54)
AST: 159 U/L — ABNORMAL HIGH (ref 15–41)
Anion gap: 13 (ref 5–15)
BILIRUBIN TOTAL: 1 mg/dL (ref 0.3–1.2)
BUN: 19 mg/dL (ref 6–20)
CO2: 27 mmol/L (ref 22–32)
Calcium: 8.2 mg/dL — ABNORMAL LOW (ref 8.9–10.3)
Chloride: 97 mmol/L — ABNORMAL LOW (ref 101–111)
Creatinine, Ser: 0.78 mg/dL (ref 0.44–1.00)
GFR calc Af Amer: >60 mL/min (ref >60)
GFR calc non Af Amer: >60 mL/min (ref >60)
GLUCOSE: 173 mg/dL — AB (ref 65–99)
Potassium: 3.9 mmol/L (ref 3.5–5.1)
Sodium: 137 mmol/L (ref 135–145)
TOTAL PROTEIN: 5.4 g/dL — AB (ref 6.5–8.1)

## 2014-12-26 LAB — GLUCOSE, CAPILLARY
Glucose-Capillary: 178 mg/dL — ABNORMAL HIGH (ref 65–99)
Glucose-Capillary: 147 mg/dL — ABNORMAL HIGH (ref 65–99)
Glucose-Capillary: 234 mg/dL — ABNORMAL HIGH (ref 65–99)

## 2014-12-26 LAB — PHOSPHORUS: PHOSPHORUS: 3.1 mg/dL (ref 2.5–4.6)

## 2014-12-26 LAB — MAGNESIUM: Magnesium: 1.9 mg/dL (ref 1.7–2.4)

## 2014-12-26 LAB — MRSA PCR SCREENING: MRSA by PCR: NEGATIVE

## 2014-12-26 LAB — ABO/RH: ABO/RH(D): A POS

## 2014-12-26 LAB — DIGOXIN LEVEL: DIGOXIN LVL: 0.6 ng/mL — AB (ref 0.8–2.0)

## 2014-12-26 SURGERY — COMPRESSION HIP
Anesthesia: General | Site: Hip | Laterality: Left

## 2014-12-26 MED ORDER — ONDANSETRON HCL 4 MG/2ML IJ SOLN: 4.0000 mg | Freq: Four times a day (QID) | INTRAMUSCULAR | Status: DC | PRN

## 2014-12-26 MED ORDER — BISACODYL 10 MG RE SUPP: 10.0000 mg | Freq: Every day | RECTAL | Status: DC | PRN

## 2014-12-26 MED ORDER — LIDOCAINE HCL (CARDIAC) 20 MG/ML IV SOLN
INTRAVENOUS | Status: DC | PRN
  Administered 2014-12-26: 60 mg via INTRAVENOUS

## 2014-12-26 MED ORDER — EPHEDRINE SULFATE 50 MG/ML IJ SOLN
INTRAMUSCULAR | Status: AC
  Filled 2014-12-26: qty 1

## 2014-12-26 MED ORDER — LIDOCAINE HCL (CARDIAC) 20 MG/ML IV SOLN
INTRAVENOUS | Status: AC
  Filled 2014-12-26: qty 5

## 2014-12-26 MED ORDER — FUROSEMIDE 20 MG PO TABS: 20.0000 mg | ORAL_TABLET | Freq: Two times a day (BID) | ORAL | Status: DC

## 2014-12-26 MED ORDER — ACETAMINOPHEN 650 MG RE SUPP: 650.0000 mg | Freq: Four times a day (QID) | RECTAL | Status: DC | PRN

## 2014-12-26 MED ORDER — SODIUM CHLORIDE 0.9 % IJ SOLN
INTRAMUSCULAR | Status: AC
  Filled 2014-12-26: qty 10

## 2014-12-26 MED ORDER — FENTANYL CITRATE (PF) 100 MCG/2ML IJ SOLN
INTRAMUSCULAR | Status: DC | PRN
  Administered 2014-12-26: 25 ug via INTRAVENOUS
  Administered 2014-12-26 (×2): 50 ug via INTRAVENOUS

## 2014-12-26 MED ORDER — HYDROCODONE-ACETAMINOPHEN 5-325 MG PO TABS
1.0000 | ORAL_TABLET | Freq: Four times a day (QID) | ORAL | Status: DC | PRN
  Administered 2014-12-26: 1 via ORAL
  Administered 2014-12-26 – 2014-12-27 (×2): 2 via ORAL
  Administered 2014-12-27: 1 via ORAL
  Administered 2014-12-28 – 2014-12-29 (×3): 2 via ORAL
  Filled 2014-12-26: qty 1
  Filled 2014-12-26 (×5): qty 2
  Filled 2014-12-26: qty 1

## 2014-12-26 MED ORDER — PHENYLEPHRINE 40 MCG/ML (10ML) SYRINGE FOR IV PUSH (FOR BLOOD PRESSURE SUPPORT)
PREFILLED_SYRINGE | INTRAVENOUS | Status: AC
  Filled 2014-12-26: qty 20

## 2014-12-26 MED ORDER — SENNOSIDES-DOCUSATE SODIUM 8.6-50 MG PO TABS: 1.0000 | ORAL_TABLET | Freq: Every evening | ORAL | Status: DC | PRN

## 2014-12-26 MED ORDER — FENTANYL CITRATE (PF) 100 MCG/2ML IJ SOLN
25.0000 ug | INTRAMUSCULAR | Status: DC | PRN
  Administered 2014-12-26: 25 ug via INTRAVENOUS

## 2014-12-26 MED ORDER — VITAMIN C 500 MG PO TABS
500.0000 mg | ORAL_TABLET | Freq: Every day | ORAL | Status: DC
  Administered 2014-12-26 – 2014-12-29 (×4): 500 mg via ORAL
  Filled 2014-12-26 (×4): qty 1

## 2014-12-26 MED ORDER — GLIMEPIRIDE 1 MG PO TABS
1.0000 mg | ORAL_TABLET | Freq: Every day | ORAL | Status: DC
  Administered 2014-12-27 – 2014-12-29 (×3): 1 mg via ORAL
  Filled 2014-12-26 (×5): qty 1

## 2014-12-26 MED ORDER — CEFAZOLIN SODIUM-DEXTROSE 2-3 GM-% IV SOLR
2.0000 g | Freq: Once | INTRAVENOUS | Status: AC
  Administered 2014-12-26: 2 g via INTRAVENOUS
  Filled 2014-12-26 (×2): qty 50

## 2014-12-26 MED ORDER — FENTANYL CITRATE (PF) 100 MCG/2ML IJ SOLN
INTRAMUSCULAR | Status: AC
  Filled 2014-12-26: qty 2

## 2014-12-26 MED ORDER — MENTHOL 3 MG MT LOZG: 1.0000 | LOZENGE | OROMUCOSAL | Status: DC | PRN

## 2014-12-26 MED ORDER — MOMETASONE FURO-FORMOTEROL FUM 100-5 MCG/ACT IN AERO
2.0000 | INHALATION_SPRAY | Freq: Two times a day (BID) | RESPIRATORY_TRACT | Status: DC
  Administered 2014-12-26 – 2014-12-29 (×6): 2 via RESPIRATORY_TRACT
  Filled 2014-12-26: qty 8.8

## 2014-12-26 MED ORDER — ONDANSETRON HCL 4 MG/2ML IJ SOLN: 4.0000 mg | Freq: Once | INTRAMUSCULAR | Status: DC | PRN

## 2014-12-26 MED ORDER — PHENOL 1.4 % MT LIQD: 1.0000 | OROMUCOSAL | Status: DC | PRN

## 2014-12-26 MED ORDER — FENTANYL CITRATE (PF) 250 MCG/5ML IJ SOLN
INTRAMUSCULAR | Status: AC
  Filled 2014-12-26: qty 5

## 2014-12-26 MED ORDER — ROPINIROLE HCL 1 MG PO TABS
4.0000 mg | ORAL_TABLET | Freq: Every day | ORAL | Status: DC
  Administered 2014-12-26 – 2014-12-28 (×3): 4 mg via ORAL
  Filled 2014-12-26 (×4): qty 4

## 2014-12-26 MED ORDER — LEVOTHYROXINE SODIUM 100 MCG PO TABS
100.0000 ug | ORAL_TABLET | Freq: Every day | ORAL | Status: DC
  Administered 2014-12-27 – 2014-12-29 (×3): 100 ug via ORAL
  Filled 2014-12-26 (×3): qty 1

## 2014-12-26 MED ORDER — FUROSEMIDE 40 MG PO TABS
40.0000 mg | ORAL_TABLET | Freq: Every day | ORAL | Status: DC
  Administered 2014-12-27 – 2014-12-29 (×3): 40 mg via ORAL
  Filled 2014-12-26 (×3): qty 1

## 2014-12-26 MED ORDER — PROPOFOL 10 MG/ML IV BOLUS
INTRAVENOUS | Status: DC | PRN
  Administered 2014-12-26: 20 mg via INTRAVENOUS
  Administered 2014-12-26: 70 mg via INTRAVENOUS
  Administered 2014-12-26: 10 mg via INTRAVENOUS

## 2014-12-26 MED ORDER — HYDROCODONE-ACETAMINOPHEN 5-325 MG PO TABS: 1.0000 | ORAL_TABLET | Freq: Four times a day (QID) | ORAL | Status: DC | PRN

## 2014-12-26 MED ORDER — SODIUM CHLORIDE 0.9 % IJ SOLN
3.0000 mL | Freq: Two times a day (BID) | INTRAMUSCULAR | Status: DC
  Administered 2014-12-26 – 2014-12-29 (×5): 3 mL via INTRAVENOUS

## 2014-12-26 MED ORDER — DOCUSATE SODIUM 100 MG PO CAPS
100.0000 mg | ORAL_CAPSULE | Freq: Two times a day (BID) | ORAL | Status: DC
  Administered 2014-12-26 – 2014-12-29 (×5): 100 mg via ORAL
  Filled 2014-12-26 (×6): qty 1

## 2014-12-26 MED ORDER — ENOXAPARIN SODIUM 40 MG/0.4ML ~~LOC~~ SOLN
40.0000 mg | SUBCUTANEOUS | Status: DC
  Administered 2014-12-27 – 2014-12-29 (×3): 40 mg via SUBCUTANEOUS
  Filled 2014-12-26 (×3): qty 0.4

## 2014-12-26 MED ORDER — ONDANSETRON HCL 4 MG/2ML IJ SOLN
INTRAMUSCULAR | Status: AC
  Filled 2014-12-26: qty 2

## 2014-12-26 MED ORDER — ACETAMINOPHEN 325 MG PO TABS
650.0000 mg | ORAL_TABLET | Freq: Four times a day (QID) | ORAL | Status: DC | PRN
  Administered 2014-12-27: 650 mg via ORAL
  Filled 2014-12-26: qty 2

## 2014-12-26 MED ORDER — SUCCINYLCHOLINE CHLORIDE 20 MG/ML IJ SOLN
INTRAMUSCULAR | Status: AC
  Filled 2014-12-26: qty 1

## 2014-12-26 MED ORDER — LACTATED RINGERS IV SOLN
INTRAVENOUS | Status: DC
  Administered 2014-12-26 (×2): via INTRAVENOUS

## 2014-12-26 MED ORDER — PHENYLEPHRINE HCL 10 MG/ML IJ SOLN
INTRAMUSCULAR | Status: DC | PRN
  Administered 2014-12-26 (×2): 40 ug via INTRAVENOUS
  Administered 2014-12-26: 80 ug via INTRAVENOUS
  Administered 2014-12-26: 40 ug via INTRAVENOUS
  Administered 2014-12-26: 8 ug via INTRAVENOUS
  Administered 2014-12-26 (×2): 40 ug via INTRAVENOUS
  Administered 2014-12-26: 8 ug via INTRAVENOUS

## 2014-12-26 MED ORDER — BUPIVACAINE-EPINEPHRINE 0.5% -1:200000 IJ SOLN
INTRAMUSCULAR | Status: DC | PRN
  Administered 2014-12-26: 20 mL

## 2014-12-26 MED ORDER — FLEET ENEMA 7-19 GM/118ML RE ENEM: 1.0000 | ENEMA | Freq: Once | RECTAL | Status: DC | PRN

## 2014-12-26 MED ORDER — METOPROLOL SUCCINATE ER 50 MG PO TB24: 50.0000 mg | ORAL_TABLET | Freq: Every evening | ORAL | Status: DC

## 2014-12-26 MED ORDER — PANTOPRAZOLE SODIUM 40 MG PO TBEC
40.0000 mg | DELAYED_RELEASE_TABLET | Freq: Every day | ORAL | Status: DC
  Administered 2014-12-26 – 2014-12-29 (×4): 40 mg via ORAL
  Filled 2014-12-26 (×4): qty 1

## 2014-12-26 MED ORDER — ROCURONIUM BROMIDE 50 MG/5ML IV SOLN
INTRAVENOUS | Status: AC
  Filled 2014-12-26: qty 1

## 2014-12-26 MED ORDER — ATORVASTATIN CALCIUM 40 MG PO TABS
40.0000 mg | ORAL_TABLET | Freq: Every day | ORAL | Status: DC
  Administered 2014-12-26 – 2014-12-28 (×3): 40 mg via ORAL
  Filled 2014-12-26 (×3): qty 1

## 2014-12-26 MED ORDER — ENSURE ENLIVE PO LIQD
237.0000 mL | Freq: Two times a day (BID) | ORAL | Status: DC
  Administered 2014-12-27 – 2014-12-28 (×3): 237 mL via ORAL

## 2014-12-26 MED ORDER — PROPOFOL 10 MG/ML IV BOLUS
INTRAVENOUS | Status: AC
  Filled 2014-12-26: qty 20

## 2014-12-26 MED ORDER — METOPROLOL SUCCINATE ER 50 MG PO TB24
50.0000 mg | ORAL_TABLET | Freq: Every evening | ORAL | Status: DC
  Administered 2014-12-27 – 2014-12-28 (×2): 50 mg via ORAL
  Filled 2014-12-26 (×2): qty 1

## 2014-12-26 MED ORDER — ASPIRIN EC 325 MG PO TBEC
325.0000 mg | DELAYED_RELEASE_TABLET | Freq: Two times a day (BID) | ORAL | Status: DC
  Administered 2014-12-26 – 2014-12-29 (×6): 325 mg via ORAL
  Filled 2014-12-26 (×6): qty 1

## 2014-12-26 MED ORDER — METOCLOPRAMIDE HCL 5 MG/ML IJ SOLN: 5.0000 mg | Freq: Three times a day (TID) | INTRAMUSCULAR | Status: DC | PRN

## 2014-12-26 MED ORDER — IPRATROPIUM-ALBUTEROL 0.5-2.5 (3) MG/3ML IN SOLN: 3.0000 mL | Freq: Four times a day (QID) | RESPIRATORY_TRACT | Status: DC | PRN

## 2014-12-26 MED ORDER — ASPIRIN EC 81 MG PO TBEC: 81.0000 mg | DELAYED_RELEASE_TABLET | Freq: Every day | ORAL | Status: DC

## 2014-12-26 MED ORDER — MORPHINE SULFATE (PF) 2 MG/ML IV SOLN: 2.0000 mg | INTRAVENOUS | Status: DC | PRN

## 2014-12-26 MED ORDER — BUPIVACAINE-EPINEPHRINE (PF) 0.5% -1:200000 IJ SOLN
INTRAMUSCULAR | Status: AC
  Filled 2014-12-26: qty 30

## 2014-12-26 MED ORDER — SODIUM CHLORIDE 0.9 % IV SOLN
INTRAVENOUS | Status: DC
  Administered 2014-12-26: 03:00:00 via INTRAVENOUS

## 2014-12-26 MED ORDER — FUROSEMIDE 20 MG PO TABS
20.0000 mg | ORAL_TABLET | Freq: Every evening | ORAL | Status: DC
  Administered 2014-12-27 – 2014-12-28 (×2): 20 mg via ORAL
  Filled 2014-12-26 (×2): qty 1

## 2014-12-26 MED ORDER — METOCLOPRAMIDE HCL 5 MG PO TABS: 5.0000 mg | ORAL_TABLET | Freq: Three times a day (TID) | ORAL | Status: DC | PRN

## 2014-12-26 MED ORDER — DIGOXIN 125 MCG PO TABS
0.1250 mg | ORAL_TABLET | Freq: Every day | ORAL | Status: DC
  Administered 2014-12-26 – 2014-12-29 (×4): 0.125 mg via ORAL
  Filled 2014-12-26 (×4): qty 1

## 2014-12-26 MED ORDER — ROCURONIUM BROMIDE 100 MG/10ML IV SOLN
INTRAVENOUS | Status: DC | PRN
  Administered 2014-12-26: 30 mg via INTRAVENOUS

## 2014-12-26 MED ORDER — GLYCOPYRROLATE 0.2 MG/ML IJ SOLN
INTRAMUSCULAR | Status: AC
  Filled 2014-12-26: qty 1

## 2014-12-26 MED ORDER — POTASSIUM CHLORIDE CRYS ER 20 MEQ PO TBCR
20.0000 meq | EXTENDED_RELEASE_TABLET | Freq: Every day | ORAL | Status: DC
  Administered 2014-12-26 – 2014-12-29 (×4): 20 meq via ORAL
  Filled 2014-12-26 (×4): qty 1

## 2014-12-26 MED ORDER — PROCHLORPERAZINE MALEATE 10 MG PO TABS
10.0000 mg | ORAL_TABLET | Freq: Four times a day (QID) | ORAL | Status: DC | PRN
  Filled 2014-12-26: qty 1

## 2014-12-26 MED ORDER — ASPIRIN EC 325 MG PO TBEC: 325.0000 mg | DELAYED_RELEASE_TABLET | Freq: Two times a day (BID) | ORAL | Status: DC

## 2014-12-26 MED ORDER — KCL IN DEXTROSE-NACL 20-5-0.45 MEQ/L-%-% IV SOLN
INTRAVENOUS | Status: DC
  Administered 2014-12-26: 15:00:00 via INTRAVENOUS
  Administered 2014-12-27: 100 mL/h via INTRAVENOUS
  Filled 2014-12-26 (×2): qty 1000

## 2014-12-26 MED ORDER — PHENYLEPHRINE HCL 10 MG/ML IJ SOLN
INTRAMUSCULAR | Status: AC
  Filled 2014-12-26: qty 1

## 2014-12-26 MED ORDER — ONDANSETRON HCL 4 MG PO TABS: 4.0000 mg | ORAL_TABLET | Freq: Four times a day (QID) | ORAL | Status: DC | PRN

## 2014-12-26 SURGICAL SUPPLY — 59 items
BIT DRILL 3.8MM (BIT) ×1
BIT DRILL TWIST 3.8X7 (BIT) ×2 IMPLANT
BNDG COHESIVE 4X5 TAN STRL (GAUZE/BANDAGES/DRESSINGS) ×3 IMPLANT
BNDG COHESIVE 6X5 TAN STRL LF (GAUZE/BANDAGES/DRESSINGS) ×3 IMPLANT
BNDG GAUZE ELAST 4 BULKY (GAUZE/BANDAGES/DRESSINGS) ×3 IMPLANT
COVER PERINEAL POST (MISCELLANEOUS) ×3 IMPLANT
COVER SURGICAL LIGHT HANDLE (MISCELLANEOUS) ×3 IMPLANT
DECANTER SPIKE VIAL GLASS SM (MISCELLANEOUS) ×3 IMPLANT
DRAPE C-ARM 42X72 X-RAY (DRAPES) ×3 IMPLANT
DRAPE INCISE IOBAN 66X45 STRL (DRAPES) ×3 IMPLANT
DRAPE ORTHO SPLIT 77X108 STRL (DRAPES) ×4
DRAPE PROXIMA HALF (DRAPES) ×12 IMPLANT
DRAPE SURG ORHT 6 SPLT 77X108 (DRAPES) ×2 IMPLANT
DRAPE U-SHAPE 47X51 STRL (DRAPES) ×3 IMPLANT
DRILL BIT 7/64X5 (BIT) ×3 IMPLANT
DRSG MEPILEX BORDER 4X4 (GAUZE/BANDAGES/DRESSINGS) ×3 IMPLANT
DRSG MEPILEX BORDER 4X8 (GAUZE/BANDAGES/DRESSINGS) ×3 IMPLANT
DRSG PAD ABDOMINAL 8X10 ST (GAUZE/BANDAGES/DRESSINGS) ×3 IMPLANT
DURAPREP 26ML APPLICATOR (WOUND CARE) ×3 IMPLANT
ELECT CAUTERY BLADE 6.4 (BLADE) ×3 IMPLANT
ELECT REM PT RETURN 9FT ADLT (ELECTROSURGICAL) ×3
ELECTRODE REM PT RTRN 9FT ADLT (ELECTROSURGICAL) ×1 IMPLANT
EVACUATOR 1/8 PVC DRAIN (DRAIN) IMPLANT
GAUZE XEROFORM 5X9 LF (GAUZE/BANDAGES/DRESSINGS) ×3 IMPLANT
GLOVE BIO SURGEON STRL SZ7.5 (GLOVE) ×3 IMPLANT
GLOVE BIO SURGEON STRL SZ8.5 (GLOVE) ×3 IMPLANT
GLOVE BIOGEL PI IND STRL 8 (GLOVE) ×1 IMPLANT
GLOVE BIOGEL PI IND STRL 9 (GLOVE) ×1 IMPLANT
GLOVE BIOGEL PI INDICATOR 8 (GLOVE) ×2
GLOVE BIOGEL PI INDICATOR 9 (GLOVE) ×2
GOWN STRL REUS W/ TWL LRG LVL3 (GOWN DISPOSABLE) ×3 IMPLANT
GOWN STRL REUS W/ TWL XL LVL3 (GOWN DISPOSABLE) ×1 IMPLANT
GOWN STRL REUS W/TWL LRG LVL3 (GOWN DISPOSABLE) ×6
GOWN STRL REUS W/TWL XL LVL3 (GOWN DISPOSABLE) ×2
KIT BASIN OR (CUSTOM PROCEDURE TRAY) ×3 IMPLANT
KIT ROOM TURNOVER OR (KITS) ×3 IMPLANT
LINER BOOT UNIVERSAL DISP (MISCELLANEOUS) ×3 IMPLANT
MANIFOLD NEPTUNE II (INSTRUMENTS) ×3 IMPLANT
NS IRRIG 1000ML POUR BTL (IV SOLUTION) ×3 IMPLANT
PACK GENERAL/GYN (CUSTOM PROCEDURE TRAY) ×3 IMPLANT
PAD ARMBOARD 7.5X6 YLW CONV (MISCELLANEOUS) ×6 IMPLANT
PIN THREADED GUIDE ACE (PIN) ×6 IMPLANT
PLATE COMP 135D 4H STD (Plate) ×3 IMPLANT
PLATE COMP 140D 4H STD (Plate) ×3 IMPLANT
SCREW ACECAP 40MM (Screw) ×15 IMPLANT
SCREW LAG 90MM (Screw) ×3 IMPLANT
STAPLER VISISTAT 35W (STAPLE) ×3 IMPLANT
STOCKINETTE IMPERVIOUS LG (DRAPES) ×3 IMPLANT
SUT ETHILON 3 0 FSL (SUTURE) ×3 IMPLANT
SUT VIC AB 0 CT1 27 (SUTURE) ×2
SUT VIC AB 0 CT1 27XBRD ANBCTR (SUTURE) ×1 IMPLANT
SUT VIC AB 1 CT1 27 (SUTURE) ×2
SUT VIC AB 1 CT1 27XBRD ANTBC (SUTURE) ×1 IMPLANT
SUT VIC AB 2-0 CTB1 (SUTURE) ×3 IMPLANT
SYR CONTROL 10ML LL (SYRINGE) ×3 IMPLANT
TAPE STRIPS DRAPE STRL (GAUZE/BANDAGES/DRESSINGS) ×3 IMPLANT
TOWEL OR 17X24 6PK STRL BLUE (TOWEL DISPOSABLE) ×3 IMPLANT
TOWEL OR 17X26 10 PK STRL BLUE (TOWEL DISPOSABLE) ×3 IMPLANT
WATER STERILE IRR 1000ML POUR (IV SOLUTION) ×3 IMPLANT

## 2014-12-26 NOTE — Progress Notes (Signed)
Initial Nutrition Assessment   INTERVENTION:  Provide Ensure Enlive po BID, each supplement provides 350 kcal and 20 grams of protein Monitor PO intake for adequacy   NUTRITION DIAGNOSIS:   Predicted suboptimal nutrient intake related to acute illness/injury as evidenced by percent weight loss, other (see comment) (recent surgery).   GOAL:   Patient will meet greater than or equal to 90% of their needs   MONITOR:   Diet advancement, PO intake, Supplement acceptance, Labs, Weight trends, Skin  REASON FOR ASSESSMENT:   Consult Assessment of nutrition requirement/status  ASSESSMENT:   81 y.o. female with past medical history of CAD (status post CABG), hypertension, hyperlipidemia, aortic stenosis, mitral valve disorder, chronic A. fib, COPD, CHF, restless leg syndrome, GERD, stage III lung cancer currently receiving radiation and chemotherapy who was brought to the emergency department after sustaining a fall at home, in her bathtub and landing on her left hip.  Workup in the ER reveals a closed comminuted left intertrochanteric femur fracture.  Pt out of room for surgery. NPO yesterday and today. Weight history shows that patient has lost 6 lbs in the past 5 weeks; 4% weight loss. Given multiple chronic health issues, surgery, and recent weight loss predict patient will have sub optimal PO intake over the next few days.   Labs: elevated glucose, low chloride  Diet Order:  Diet NPO time specified  Skin:  Wound (see comment) (closed incision on left hiip)  Last BM:  10/6  Height:   Ht Readings from Last 1 Encounters:  12/22/14 '5\' 1"'$  (1.549 m)    Weight:   Wt Readings from Last 1 Encounters:  12/25/14 139 lb 12.8 oz (63.413 kg)    Ideal Body Weight:  47.7 kg  BMI:  Body Mass index of 26.26 kg/(m^2)  Estimated Nutritional Needs:   Kcal:  1500-1750  Protein:  85-100 grams  Fluid:  1.5-1.7 L/day  EDUCATION NEEDS:   No education needs identified at this  time  Scarlette Ar RD, LDN Inpatient Clinical Dietitian Pager: [phone] After Hours Pager: [phone]

## 2014-12-26 NOTE — Assessment & Plan Note (Signed)
Echo done in October 2015 shows an ejection fraction of 55/to 54%, LVH and diastolic dysfunction.

## 2014-12-26 NOTE — Progress Notes (Signed)
Late entry for @ 1230  12/26/2014 CBG done 135, over ride in CBG machine, not showing up on Epic tx with monitor to Room @ 1315

## 2014-12-26 NOTE — Anesthesia Preprocedure Evaluation (Signed)
Anesthesia Evaluation  Patient identified by MRN, date of birth, ID band Patient awake    Reviewed: Allergy & Precautions, NPO status , Patient's Chart, lab work & pertinent test results  Airway Mallampati: II  TM Distance: >3 FB Neck ROM: Full    Dental  (+) Dental Advisory Given   Pulmonary shortness of breath, COPD, former smoker,  Pt with frequent cough, with clear/yellow sputum production. Pt appears mildly SOB. Pt does not tolerate lying flat--inc RR and very anxious.    + rhonchi        Cardiovascular hypertension, + CAD, + Past MI, + CABG and +CHF  + dysrhythmias + Valvular Problems/Murmurs  Rhythm:Regular Rate:Normal + Systolic murmurs    Neuro/Psych negative neurological ROS  negative psych ROS   GI/Hepatic Neg liver ROS, GERD  ,  Endo/Other  diabetesHypothyroidism   Renal/GU negative Renal ROS     Musculoskeletal   Abdominal   Peds  Hematology   Anesthesia Other Findings   Reproductive/Obstetrics                             Anesthesia Physical  Anesthesia Plan  ASA: III  Anesthesia Plan: General   Post-op Pain Management:    Induction: Intravenous  Airway Management Planned: Oral ETT  Additional Equipment:   Intra-op Plan:   Post-operative Plan: Possible Post-op intubation/ventilation  Informed Consent: I have reviewed the patients History and Physical, chart, labs and discussed the procedure including the risks, benefits and alternatives for the proposed anesthesia with the patient or authorized representative who has indicated his/her understanding and acceptance.   Dental advisory given  Plan Discussed with: CRNA, Anesthesiologist and Surgeon  Anesthesia Plan Comments:         Anesthesia Quick Evaluation

## 2014-12-26 NOTE — Discharge Instructions (Signed)
Hip Fracture A hip fracture is a fracture of the upper part of your thigh bone (femur).  CAUSES A hip fracture is caused by a direct blow to the side of your hip. This is usually the result of a fall but can occur in other circumstances, such as an automobile accident. RISK FACTORS There is an increased risk of hip fractures in people with:  An unsteady walking pattern (gait) and those with conditions that contribute to poor balance, such as Parkinson's disease or dementia.  Osteopenia and osteoporosis.  Cancer that spreads to the leg bones.  Certain metabolic diseases. SYMPTOMS  Symptoms of hip fracture include:  Pain over the injured hip.  Inability to put weight on the leg in which the fracture occurred (although, some patients are able to walk after a hip fracture).  Toes and foot of the affected leg point outward when you lie down. DIAGNOSIS A physical exam can determine if a hip fracture is likely to have occurred. X-ray exams are needed to confirm the fracture and to look for other injuries. The X-ray exam can help to determine the type of hip fracture. Rarely, the fracture is not visible on an X-ray image and a CT scan or MRI will have to be done. TREATMENT  The treatment for a fracture is usually surgery. This means using a screw, nail, or rod to hold the bones in place.  HOME CARE INSTRUCTIONS Take all medicines as directed by your health care provider. SEEK MEDICAL CARE IF: Pain continues, even after taking pain medicine. MAKE SURE YOU:  Understand these instructions.   Will watch your condition.  Will get help right away if you are not doing well or get worse.   This information is not intended to replace advice given to you by your health care provider. Make sure you discuss any questions you have with your health care provider.   Document Released: 03/07/2005 Document Revised: 03/12/2013 Document Reviewed: 10/17/2012 Elsevier Interactive Patient Education [employer]

## 2014-12-26 NOTE — Progress Notes (Signed)
Utilization review completed.  

## 2014-12-26 NOTE — Consult Note (Signed)
Reason for Consult: Left hip 2 part intertrochanteric fracture Referring Physician: EDP  Marissa [name] is an 81 y.o. female.  HPI: Patient was rinsing off in the toe of at her home in with set slipped and fell and sustained a moderately displaced 2 part left hip intertrochanteric fracture. Was transported to [facility] emergency room where she underwent evaluation including x-rays last night. Past medical history of CAD (status post CABG), hypertension, hyperlipidemia, aortic stenosis, mitral valve disorder, chronic A. fib, COPD, CHF, restless leg syndrome, GERD, stage III lung cancer currently receiving radiation and chemotherapy. She denies loss of consciousness or head trauma during the fall. She denies chest pain, palpitations, dizziness, diaphoresis prior to the fall. She states that the fall was an accident. She does report a little bruising to the posterior aspect of the left arm but has full range of motion of the shoulder elbow and wrist.  Past Medical History  Diagnosis Date  . HTN (hypertension)   . Hypercholesteremia   . CAD (coronary artery disease)     s/p CABG 03/2011  . Aortic valve disorders   . RLS (restless legs syndrome)   . A-fib (HCC)   . Osteopenia   . Mitral valve disease     s/p MVR 03/2011 w/ 27 mm porcine valve  . Hypothyroidism   . COPD (chronic obstructive pulmonary disease) (HCC)   . CHF (congestive heart failure) (HCC)   . Aortic stenosis     (s/p AVR w/ 21 mm porcine valve 03/2011  . Mastoiditis   . Myocardial infarction (HCC)     "slight"  . Dysrhythmia   . Shortness of breath     with exertion  . Pre-diabetes   . GERD (gastroesophageal reflux disease)     takes nexium prn  . Arthritis   . History of blood transfusion     Rheumatic Fever  . Lung cancer (HCC)   . Pneumonia   . Diabetes mellitus without complication (HCC)   . Full code status 12/22/2014  . Full code status 12/22/2014    Past Surgical History  Procedure Laterality Date   . Mitral valve replacement      1990's  . Laparoscopic cholecystectomy    . Aortic valve replacement    . Tonsillectomy    . Coronary artery bypass graft  03/2011    1990's also  . Video bronchoscopy with endobronchial navigation N/A 01/06/2014    Procedure: VIDEO BRONCHOSCOPY WITH ENDOBRONCHIAL NAVIGATION;  Surgeon: Steven C Hendrickson, MD;  Location: [facility] OR;  Service: Thoracic;  Laterality: N/A;  . Video bronchoscopy with endobronchial ultrasound N/A 01/06/2014    Procedure: VIDEO BRONCHOSCOPY WITH ENDOBRONCHIAL ULTRASOUND;  Surgeon: Steven C Hendrickson, MD;  Location: [facility] OR;  Service: Thoracic;  Laterality: N/A;  . Video bronchoscopy with endobronchial navigation N/A 11/21/2014    Procedure: VIDEO BRONCHOSCOPY WITH ENDOBRONCHIAL NAVIGATION;  Surgeon: Steven C Hendrickson, MD;  Location: [facility] OR;  Service: Thoracic;  Laterality: N/A;  . Video bronchoscopy with endobronchial ultrasound N/A 11/21/2014    Procedure: VIDEO BRONCHOSCOPY WITH ENDOBRONCHIAL ULTRASOUND;  Surgeon: Steven C Hendrickson, MD;  Location: [facility] OR;  Service: Thoracic;  Laterality: N/A;    Family History  Problem Relation Age of Onset  . Adopted: Yes  . Cancer Brother     lung  . Heart attack Neg Hx   . Stroke Neg Hx     Social History:  reports that she has quit smoking. Her smoking use included Cigarettes. She has a 15   pack-year smoking history. She has never used smokeless tobacco. She reports that she does not drink alcohol or use illicit drugs.  Allergies:  Allergies  Allergen Reactions  . Coumadin [Warfarin Sodium] Other (See Comments)    Reaction: Bleeding from ears  . Prozac [Fluoxetine Hcl] Rash    Medications: I have reviewed the patient's current medications.  Results for orders placed or performed during the hospital encounter of 12/25/14 (from the past 48 hour(s))  Basic metabolic panel     Status: Abnormal   Collection Time: 12/25/14 10:38 PM  Result Value Ref Range   Sodium 138 135 - 145 mmol/L    Potassium 3.8 3.5 - 5.1 mmol/L   Chloride 96 (L) 101 - 111 mmol/L   CO2 32 22 - 32 mmol/L   Glucose, Bld 178 (H) 65 - 99 mg/dL   BUN 32 (H) 6 - 20 mg/dL   Creatinine, Ser 1.13 (H) 0.44 - 1.00 mg/dL   Calcium 8.8 (L) 8.9 - 10.3 mg/dL   GFR calc non Af Amer 45 (L) >60 mL/min   GFR calc Af Amer 52 (L) >60 mL/min    Comment: (NOTE) The eGFR has been calculated using the CKD EPI equation. This calculation has not been validated in all clinical situations. eGFR's persistently <60 mL/min signify possible Chronic Kidney Disease.    Anion gap 10 5 - 15  CBC WITH DIFFERENTIAL     Status: Abnormal   Collection Time: 12/25/14 10:38 PM  Result Value Ref Range   WBC 10.9 (H) 4.0 - 10.5 K/uL   RBC 4.77 3.87 - 5.11 MIL/uL   Hemoglobin 13.3 12.0 - 15.0 g/dL   HCT 41.1 36.0 - 46.0 %   MCV 86.2 78.0 - 100.0 fL   MCH 27.9 26.0 - 34.0 pg   MCHC 32.4 30.0 - 36.0 g/dL   RDW 16.9 (H) 11.5 - 15.5 %   Platelets 227 150 - 400 K/uL   Neutrophils Relative % 85 %   Neutro Abs 9.3 (H) 1.7 - 7.7 K/uL   Lymphocytes Relative 7 %   Lymphs Abs 0.8 0.7 - 4.0 K/uL   Monocytes Relative 7 %   Monocytes Absolute 0.7 0.1 - 1.0 K/uL   Eosinophils Relative 1 %   Eosinophils Absolute 0.1 0.0 - 0.7 K/uL   Basophils Relative 0 %   Basophils Absolute 0.0 0.0 - 0.1 K/uL  Protime-INR     Status: None   Collection Time: 12/25/14 10:38 PM  Result Value Ref Range   Prothrombin Time 13.2 11.6 - 15.2 seconds   INR 0.98 0.00 - 1.49  Type and screen     Status: None   Collection Time: 12/25/14 10:38 PM  Result Value Ref Range   ABO/RH(D) A POS    Antibody Screen NEG    Sample Expiration 12/28/2014   ABO/Rh     Status: None   Collection Time: 12/25/14 10:38 PM  Result Value Ref Range   ABO/RH(D) A POS   Digoxin level     Status: Abnormal   Collection Time: 12/25/14 11:55 PM  Result Value Ref Range   Digoxin Level 0.6 (L) 0.8 - 2.0 ng/mL  Magnesium     Status: None   Collection Time: 12/26/14  1:53 AM  Result  Value Ref Range   Magnesium 1.9 1.7 - 2.4 mg/dL  Phosphorus     Status: None   Collection Time: 12/26/14  1:53 AM  Result Value Ref Range   Phosphorus 3.1 2.5 - 4.6  mg/dL    Dg Chest 1 View  12/25/2014   CLINICAL DATA:  LEFT hip fracture.  EXAM: CHEST 1 VIEW  COMPARISON:  Chest radiograph November 21, 2014 and CT chest November 20, 2014.  FINDINGS: Masslike consolidation RIGHT upper lobe, decreased from prior study. Similar pulmonary vascular congestion and fullness of the RIGHT hilum corresponding to lymphadenopathy on recent CT chest. The cardiac silhouette is mildly enlarged. Status post median sternotomy for cardiac valve replacement and CABG. Similar LEFT lung base atelectasis/ scarring. No pleural effusion. No pneumothorax. Partially imaged screw in RIGHT humeral head.  IMPRESSION: Improving RIGHT upper lobe consolidation/mass, recommend continued surveillance.  Mild cardiomegaly, pulmonary vascular congestion.   Electronically Signed   By: Courtnay  Bloomer M.D.   On: 12/25/2014 22:42   Dg Hip Unilat With Pelvis 2-3 Views Left  12/25/2014   CLINICAL DATA:  Fall  EXAM: DG HIP (WITH OR WITHOUT PELVIS) 2-3V LEFT  COMPARISON:  None.  FINDINGS: Comminuted intertrochanteric left femur fracture. Lesser trochanter is displaced. Greater trochanter is also displaced. Slight varus deformity. Postop changes in the pelvis. Advanced lumbar spine degenerative change.  IMPRESSION: Comminuted left intertrochanteric femur fracture.   Electronically Signed   By: Arthur  Hoss M.D.   On: 12/25/2014 22:39    ROS: Today patient denies any chest pain or shortness of breath and again denies any loss of consciousness Blood pressure 116/49, pulse 73, temperature 98 F (36.7 C), temperature source Oral, resp. rate 20, SpO2 98 %. Physical Exam: Left lower extremity is shortened 1/2 inch, externally rotated 60 in relation of the right lower extremity normal sensation to her foot normal pulses to her foot skin is intact  there is no obvious bruising along the proximal aspect of the lateral thigh but she is quite tender there. Any attempts at movement of the left hip causes significant pain.  Assessment/Plan: Assessment: Moderately displaced left hip 2 part intertrochanteric fracture  Plan: Open reduction internal fixation using dynamic hip screw wrist and benefits of surgery discussed questions answered. Medical management per tried hospitalist.  , J 12/26/2014, 6:38 AM      

## 2014-12-26 NOTE — Anesthesia Procedure Notes (Signed)
Procedure Name: Intubation Date/Time: 12/26/2014 10:38 AM Performed by: Scheryl Darter Pre-anesthesia Checklist: Patient identified, Emergency Drugs available, Suction available, Patient being monitored and Timeout performed Patient Re-evaluated:Patient Re-evaluated prior to inductionOxygen Delivery Method: Circle system utilized Preoxygenation: Pre-oxygenation with 100% oxygen Intubation Type: IV induction and Inhalational induction with existing ETT Ventilation: Mask ventilation without difficulty Grade View: Grade I Tube type: Oral Tube size: 7.5 mm Number of attempts: 1 Airway Equipment and Method: Stylet Placement Confirmation: ETT inserted through vocal cords under direct vision,  positive ETCO2 and breath sounds checked- equal and bilateral Secured at: 22 cm Tube secured with: Tape Dental Injury: Teeth and Oropharynx as per pre-operative assessment

## 2014-12-26 NOTE — Anesthesia Postprocedure Evaluation (Signed)
  Anesthesia Post-op Note  Patient: Marissa [name]  Procedure(s) Performed: Procedure(s) (LRB): ORIF LEFT HIP INTERTROCHANTRIC FRACTURE (Left)  Patient Location: PACU  Anesthesia Type: General  Level of Consciousness: awake and alert   Airway and Oxygen Therapy: Patient Spontanous Breathing  Post-op Pain: mild  Post-op Assessment: Post-op Vital signs reviewed, Patient's Cardiovascular Status Stable, Respiratory Function Stable, Patent Airway and No signs of Nausea or vomiting  Last Vitals:  Filed Vitals:   12/26/14 1257  BP: 112/46  Pulse: 70  Temp:   Resp: 18    Post-op Vital Signs: stable   Complications: No apparent anesthesia complications

## 2014-12-26 NOTE — H&P (Signed)
[facility] History and Physical  Marissa [name] XNT:[id number] DOB: [birth year] DOA: 12/25/2014  Referring physician: Jola Schmidt, M.D. PCP: [name], MD   Chief Complaint: Fall and left hip pain.  HPI: Marissa [name] is a 81 y.o. female  with past medical history of CAD (status post CABG), hypertension, hyperlipidemia, aortic stenosis, mitral valve disorder, chronic A. fib, COPD, CHF, restless leg syndrome, GERD, stage III lung cancer currently receiving radiation and chemotherapy who was brought to the emergency department after sustaining a fall at home, in her bathtub and landing on her left hip. Per patient, she had immediate pain and was unable to get up on her own. She denies loss of consciousness or head trauma during the fall. She denies chest pain, palpitations, dizziness, diaphoresis prior to the fall. She states that the fall was an accident.  Workup in the ER reveals a closed comminuted left intertrochanteric femur fracture. She is states that her pain is a lot better after she received IV morphine.   Review of Systems:  Constitutional:  Positive fatigue and weight loss No night sweats, Fevers, chills,  HEENT:  No headaches, Difficulty swallowing,Tooth/dental problems,Sore throat,  No sneezing, itching, ear ache, nasal congestion, post nasal drip,  Cardio-vascular:  No chest pain, Orthopnea, PND, swelling in lower extremities, anasarca, dizziness, palpitations  GI:  No heartburn, indigestion, abdominal pain, nausea, vomiting, diarrhea, change in bowel habits, loss of appetite  Resp:  Frequent shortness of breath with exertion or at rest. Occasional productive cough, No excess mucus,   No non-productive cough, no hemoptysis. No change in color of mucus.No wheezing.No chest wall deformity  Skin:  no rash or lesions.  GU:  no dysuria, change in color of urine, no urgency or frequency. No flank pain.  Musculoskeletal:  Positive pain and decreased  range of motion of left hip.. No back pain.  Psych:  No change in mood or affect. No depression or anxiety.   Past Medical History  Diagnosis Date  . HTN (hypertension)   . Hypercholesteremia   . CAD (coronary artery disease)     s/p CABG 03/2011  . Aortic valve disorders   . RLS (restless legs syndrome)   . A-fib ([place])   . Osteopenia   . Mitral valve disease     s/p MVR 03/2011 w/ 27 mm porcine valve  . Hypothyroidism   . COPD (chronic obstructive pulmonary disease) ([place])   . CHF (congestive heart failure) ([place])   . Aortic stenosis     (s/p AVR w/ 21 mm porcine valve 03/2011  . Mastoiditis   . Myocardial infarction ([place])     "slight"  . Dysrhythmia   . Shortness of breath     with exertion  . Pre-diabetes   . GERD (gastroesophageal reflux disease)     takes nexium prn  . Arthritis   . History of blood transfusion     Rheumatic Fever  . Lung cancer ([place])   . Pneumonia   . Diabetes mellitus without complication ([place])   . Full code status 12/22/2014  . Full code status 12/22/2014   Past Surgical History  Procedure Laterality Date  . Mitral valve replacement      1990's  . Laparoscopic cholecystectomy    . Aortic valve replacement    . Tonsillectomy    . Coronary artery bypass graft  03/2011    1990's also  . Video bronchoscopy with endobronchial navigation N/A 01/06/2014    Procedure: VIDEO BRONCHOSCOPY WITH ENDOBRONCHIAL  NAVIGATION;  Surgeon: [name], MD;  Location: [place];  Service: Thoracic;  Laterality: N/A;  . Video bronchoscopy with endobronchial ultrasound N/A 01/06/2014    Procedure: VIDEO BRONCHOSCOPY WITH ENDOBRONCHIAL ULTRASOUND;  Surgeon: [name], MD;  Location: [place];  Service: Thoracic;  Laterality: N/A;  . Video bronchoscopy with endobronchial navigation N/A 11/21/2014    Procedure: VIDEO BRONCHOSCOPY WITH ENDOBRONCHIAL NAVIGATION;  Surgeon: [name], MD;  Location: [place];  Service: Thoracic;  Laterality: N/A;  . Video  bronchoscopy with endobronchial ultrasound N/A 11/21/2014    Procedure: VIDEO BRONCHOSCOPY WITH ENDOBRONCHIAL ULTRASOUND;  Surgeon: [name], MD;  Location: [place];  Service: Thoracic;  Laterality: N/A;   Social History:  reports that she has quit smoking. Her smoking use included Cigarettes. She has a 15 pack-year smoking history. She has never used smokeless tobacco. She reports that she does not drink alcohol or use illicit drugs.  Allergies  Allergen Reactions  . Coumadin [Warfarin Sodium] Other (See Comments)    Reaction: Bleeding from ears  . Prozac [Fluoxetine Hcl] Rash    Family History  Problem Relation Age of Onset  . Adopted: Yes  . Cancer Brother     lung  . Heart attack Neg Hx   . Stroke Neg Hx      Prior to Admission medications   Medication Sig Start Date End Date Taking? Authorizing Provider  alendronate (FOSAMAX) 70 MG tablet Take 70 mg by mouth once a week.  09/16/14  Yes Historical Provider, MD  aspirin EC 81 MG tablet Take 1 tablet (81 mg total) by mouth daily. 12/11/13  Yes Scott T Kathlen Mody, PA-C  atorvastatin (LIPITOR) 40 MG tablet Take 40 mg by mouth daily.    Yes Historical Provider, MD  CALCIUM CARBONATE PO Take 1 tablet by mouth 2 (two) times daily.   Yes Historical Provider, MD  digoxin (LANOXIN) 0.125 MG tablet Take 0.125 mg by mouth daily.    Yes Historical Provider, MD  esomeprazole (NEXIUM) 40 MG capsule Take 40 mg by mouth daily as needed (for acid reflux).    Yes Historical Provider, MD  fluticasone-salmeterol (ADVAIR HFA) 115-21 MCG/ACT inhaler Inhale 2 puffs into the lungs 2 (two) times daily.    Yes Historical Provider, MD  furosemide (LASIX) 40 MG tablet Take 20-40 mg by mouth 2 (two) times daily. Take 40 mg by mouth in the morning and take 20 mg by mouth in the evening for fluid.   Yes Historical Provider, MD  glimepiride (AMARYL) 1 MG tablet Take 1 mg by mouth daily with breakfast.   Yes Historical Provider, MD  Ipratropium-Albuterol  (COMBIVENT) 20-100 MCG/ACT AERS respimat Inhale 1 puff into the lungs every 6 (six) hours as needed for wheezing or shortness of breath.    Yes Historical Provider, MD  ipratropium-albuterol (DUONEB) 0.5-2.5 (3) MG/3ML SOLN Take 3 mLs by nebulization every 6 (six) hours as needed (for shortness of breath).    Yes Historical Provider, MD  levothyroxine (SYNTHROID, LEVOTHROID) 100 MCG tablet Take 100 mcg by mouth daily before breakfast.    Yes Historical Provider, MD  metoprolol succinate (TOPROL-XL) 50 MG 24 hr tablet Take 1 tablet (50 mg total) by mouth every evening. 12/16/14  Yes Jerline Pain, MD  potassium chloride SA (K-DUR,KLOR-CON) 20 MEQ tablet Take 20 mEq by mouth daily.    Yes Historical Provider, MD  prochlorperazine (COMPAZINE) 10 MG tablet Take 1 tablet (10 mg total) by mouth every 6 (six) hours  as needed for nausea or vomiting. 12/09/14  Yes Curt Bears, MD  rOPINIRole (REQUIP) 4 MG tablet Take 4 mg by mouth at bedtime.   Yes Historical Provider, MD  vitamin C (ASCORBIC ACID) 500 MG tablet Take 500 mg by mouth daily.   Yes Historical Provider, MD  Wound Cleansers (RADIAPLEX EX) Apply 1 application topically daily as needed (rain).    Yes Historical Provider, MD   Physical Exam: Filed Vitals:   12/25/14 2201  BP: 129/80  Pulse: 76  Temp: 98.8 F (37.1 C)  TempSrc: Oral  Resp: 16  SpO2: 96%    Wt Readings from Last 3 Encounters:  12/25/14 63.413 kg (139 lb 12.8 oz)  12/22/14 63.685 kg (140 lb 6.4 oz)  12/19/14 63.322 kg (139 lb 9.6 oz)    General:  Appears calm and comfortable Eyes: PERRL, normal lids, irises & conjunctiva ENT: grossly normal hearing, lips & tongue are dry. Neck: no LAD, masses or thyromegaly Cardiovascular: Irregularly irregular No LE edema. Telemetry: Atrial fibrillation Respiratory: CTA bilaterally, no w/r/r. Normal respiratory effort on anterior exam. Abdomen: soft, ntnd Skin: no rash or induration seen on limited exam Musculoskeletal: Shortened  left lower extremity, positive left pedal pulses and capillary refill. Psychiatric: grossly normal mood and affect, speech fluent and appropriate Neurologic: grossly non-focal.          Labs on Admission:  Basic Metabolic Panel:  Recent Labs Lab 12/22/14 0845 12/25/14 2238  NA 139 138  K 3.5 3.8  CL  --  96*  CO2 26 32  GLUCOSE 202* 178*  BUN 21.1 32*  CREATININE 0.8 1.13*  CALCIUM 9.6 8.8*   Liver Function Tests:  Recent Labs Lab 12/22/14 0845  AST 32  ALT 30  ALKPHOS 78  BILITOT 0.58  PROT 6.4  ALBUMIN 3.1*   CBC:  Recent Labs Lab 12/22/14 0844 12/25/14 2238  WBC 9.2 10.9*  NEUTROABS 7.5* 9.3*  HGB 12.1 13.3  HCT 37.1 41.1  MCV 86.1 86.2  PLT 207 227   Radiological Exams on Admission: Dg Chest 1 View  12/25/2014   CLINICAL DATA:  LEFT hip fracture.  EXAM: CHEST 1 VIEW  COMPARISON:  Chest radiograph November 21, 2014 and CT chest November 20, 2014.  FINDINGS: Masslike consolidation RIGHT upper lobe, decreased from prior study. Similar pulmonary vascular congestion and fullness of the RIGHT hilum corresponding to lymphadenopathy on recent CT chest. The cardiac silhouette is mildly enlarged. Status post median sternotomy for cardiac valve replacement and CABG. Similar LEFT lung base atelectasis/ scarring. No pleural effusion. No pneumothorax. Partially imaged screw in RIGHT humeral head.  IMPRESSION: Improving RIGHT upper lobe consolidation/mass, recommend continued surveillance.  Mild cardiomegaly, pulmonary vascular congestion.   Electronically Signed   By: Elon Alas M.D.   On: 12/25/2014 22:42   Dg Hip Unilat With Pelvis 2-3 Views Left  12/25/2014   CLINICAL DATA:  Fall  EXAM: DG HIP (WITH OR WITHOUT PELVIS) 2-3V LEFT  COMPARISON:  None.  FINDINGS: Comminuted intertrochanteric left femur fracture. Lesser trochanter is displaced. Greater trochanter is also displaced. Slight varus deformity. Postop changes in the pelvis. Advanced lumbar spine degenerative  change.  IMPRESSION: Comminuted left intertrochanteric femur fracture.   Electronically Signed   By: Marybelle Killings M.D.   On: 12/25/2014 22:39     EKG: Independently reviewed. Vent. rate 87 BPM PR interval * ms QRS duration 120 ms QT/QTc 408/491 ms P-R-T axes -1 88 -13 Atrial fibrillation Probable left ventricular hypertrophy ST depr, consider  ischemia, inferior leads Borderline prolonged QT interval  Assessment/Plan Principal Problem:     Closed left hip fracture (HCC) Admit to [facility] at the request of orthopedic surgery. Continue morphine for pain management.  Active Problems:     HTN (hypertension) Continue current antihypertensive therapy and monitor blood pressure.      Hypercholesteremia Continue Lipitor and monitor LFTs periodically.      CAD (coronary artery disease) Echocardiogram in the morning prior to procedure.       Mitral valve disease      Aortic valve disorder      A-fib [facility]) Telemetry monitoring. Continue digoxin and monitor electrolytes closely. Check echocardiogram.        Hypothyroidism Continue levothyroxine and monitor TSH.      COPD (chronic obstructive pulmonary disease) (HCC) Continue supplemental oxygen and bronchodilators.       Type 2 diabetes Continue Amaryl and monitor CBG.        Restless leg syndrome Continue Mirapex 4 mg oral at bedtime.        Stage III Lung cancer. The patient was very adamant about continuing chemotherapy and radiation therapy during the hospitalization. I explained to her and her daughter that this would be difficult to do and that this would be up to the oncologist and radiation oncologist.    Orthopedic surgery was consulted by Dr. Jola Schmidt from the emergency department and they requested the patient to be admitted at [facility].  Code Status: Full code. DVT Prophylaxis: Lovenox SQ. Family Communication:  Her daughter was present in the room at the time of  admission. Saler,Peggy Daughter [phone]  Disposition Plan: Admit to [facility] for orthopedic surgery evaluation.  Time spent: Over 90 minutes were spent in the process of this admission.  Reubin Milan [facility] Pager [phone].

## 2014-12-26 NOTE — Transfer of Care (Signed)
Immediate Anesthesia Transfer of Care Note  Patient: Marissa [name]  Procedure(s) Performed: Procedure(s): ORIF LEFT HIP INTERTROCHANTRIC FRACTURE (Left)  Patient Location: PACU  Anesthesia Type:General  Level of Consciousness: awake, alert , oriented and sedated  Airway & Oxygen Therapy: Patient Spontanous Breathing and Patient connected to nasal cannula oxygen  Post-op Assessment: Report given to RN, Post -op Vital signs reviewed and stable and Patient moving all extremities  Post vital signs: Reviewed and stable  Last Vitals:  Filed Vitals:   12/26/14 0609  BP: 116/49  Pulse: 73  Temp: 36.7 C  Resp: 20    Complications: No apparent anesthesia complications

## 2014-12-26 NOTE — Progress Notes (Signed)
Patient admitted after midnight- please see H&P.  On way to have fracture repair  Marissa Bear DO

## 2014-12-26 NOTE — Telephone Encounter (Signed)
Ms. Sago fell last night and fractured left hip.  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  She is currently in the OR as of 10:40am.

## 2014-12-26 NOTE — Op Note (Signed)
DATE OF PROCEDURE: 10/27/2011  PREOPERATIVE DIAGNOSIS: L hip intertrochanteric fracture 3-part with varus displacement  POSTOPERATIVE DIAGNOSIS: Same  PROCEDURE: Open reduction internal fixation left hip intertrochanteric fracture using a DePuy TK 2 4 hole 135 short barrel sideplate, 90 mm lag screw keyed  SURGEON: Leola Fiore J  ASSISTANT: Eric K. Barton Dubois  (present throughout entire procedure and necessary for timely completion of the procedure) ANESTHESIA: General  BLOOD LOSS: 300 cc  FLUID REPLACEMENT: 1200 cc crystalloid  DRAINS: Foley Catheter  URINE OUTPUT: 476LY  COMPLICATIONS: none   INDICATIONS FOR PROCEDURE: R hip intertrochanteric fracture, 3-part, sustained from a fal. Patient. presented to the emergency room, was admitted by the medicine service and orthopedic consultation was obtained. To decrease pain and increase function we have recommended open reduction internal fixation using a dynamic hip screw. The risks, benefits, and alternatives were discussed at length including but not limited to the risks of infection, bleeding, nerve injury, stiffness, blood clots, the need for revision surgery, cardiopulmonary complications, among others, and they were willing to proceed. Benefits have been discussed. Questions answered.   PROCEDURE IN DETAIL: The patient was identified by armband,  received preoperative IV antibiotics in the holding area, taken to the operating room , appropriate anesthetic monitors were attached and general endotracheal anesthesia induced. Pt. was then transferred to a radiolucent flat Jackson table, rolled into the L lateral decubitus position and fixed there with a Stulberg Mark 2 pelvic clamp. Under C-arm imaging control we then performed a closed reduction with abduction and internal rotation obtaining a near-anatomic reduction with the leg in full extension. The lateral aspect of the hip and thigh was then prepped and draped in usual sterile fashion in the  iliac crest to the knee. A timeout procedure was performed. A 10 centimeter incision starting out at the flare of the greater trochanter and going distally was made along the lateral thigh through the skin and subcutaneous tissue down to the level of the IT band which was then cut in line with the skin incision exposing the vastus lateralis. This was likewise split taking [place] down to the flare of the greater trochanter and down the lateral side of the femur for about 8-10 cm. Under C-arm image control we then placed a guide pin at at 135 angle to the lateral flare of the greater trochanter up the femoral neck and into the center of the femoral head on the AP and lateral views. This measured at 97 mm and the triple reamer was set at 90 mm. Bone quality was actually quite good during the reaming. We then used to tap to prepare the drill hole for an 90 mm lag screw, which was placed without difficulty over the guide pin. We then selected a 135 4-hole sideplate placed it over the lag screw and fixed it to the lateral femur with 4 bicortical 4.5 mm screws. C-arm images were taken confirming a near-anatomic reduction. The wound was then thoroughly irrigated with normal saline solution. The vastus lateralis was closed with running 0 Vicryl suture, the IT band with running #1 Vicryl suture, the subcutaneous tissue with 0 and 2-0 undyed Vicryl suture and the skin with 3-0 vicryl SQ. A dressing of Aquacil was then applied the patient was unclamped rolled supine awakened extubated and taken to the recovery room without difficulty.  Frederik Pear J  10/27/2011, 7:29 PM

## 2014-12-27 DIAGNOSIS — C3411 Malignant neoplasm of upper lobe, right bronchus or lung: Secondary | ICD-10-CM

## 2014-12-27 DIAGNOSIS — I359 Nonrheumatic aortic valve disorder, unspecified: Secondary | ICD-10-CM

## 2014-12-27 LAB — CBC
HCT: 30.5 % — ABNORMAL LOW (ref 36.0–46.0)
HEMOGLOBIN: 9.9 g/dL — AB (ref 12.0–15.0)
MCH: 28.1 pg (ref 26.0–34.0)
MCHC: 32.5 g/dL (ref 30.0–36.0)
MCV: 86.6 fL (ref 78.0–100.0)
PLATELETS: 146 10*3/uL — AB (ref 150–400)
RBC: 3.52 MIL/uL — AB (ref 3.87–5.11)
RDW: 17.5 % — ABNORMAL HIGH (ref 11.5–15.5)
WBC: 8.4 10*3/uL (ref 4.0–10.5)

## 2014-12-27 LAB — BASIC METABOLIC PANEL
ANION GAP: 11 (ref 5–15)
BUN: 14 mg/dL (ref 6–20)
CHLORIDE: 95 mmol/L — AB (ref 101–111)
CO2: 29 mmol/L (ref 22–32)
Calcium: 8 mg/dL — ABNORMAL LOW (ref 8.9–10.3)
Creatinine, Ser: 0.71 mg/dL (ref 0.44–1.00)
GFR calc Af Amer: >60 mL/min (ref >60)
GLUCOSE: 153 mg/dL — AB (ref 65–99)
POTASSIUM: 3.9 mmol/L (ref 3.5–5.1)
Sodium: 135 mmol/L (ref 135–145)

## 2014-12-27 LAB — TSH: TSH: 1.307 u[IU]/mL (ref 0.350–4.500)

## 2014-12-27 LAB — GLUCOSE, CAPILLARY
GLUCOSE-CAPILLARY: 151 mg/dL — AB (ref 65–99)
Glucose-Capillary: 149 mg/dL — ABNORMAL HIGH (ref 65–99)
Glucose-Capillary: 214 mg/dL — ABNORMAL HIGH (ref 65–99)
Glucose-Capillary: 147 mg/dL — ABNORMAL HIGH (ref 65–99)

## 2014-12-27 LAB — HEMOGLOBIN A1C
HEMOGLOBIN A1C: 7.5 % — AB (ref 4.8–5.6)
MEAN PLASMA GLUCOSE: 169 mg/dL

## 2014-12-27 NOTE — Progress Notes (Signed)
OT Cancellation Note  Patient Details Name: Marissa [name] MRN: [id number] DOB: 1933/07/23   Cancelled Treatment:    Reason Eval/Treat Not Completed:  (OT screened) Pt is Medicare and current D/C plan is SNF. No apparent immediate acute care OT needs, therefore will defer OT to SNF. If OT eval is needed please call Acute Rehab Dept. at [phone] or text page OT at [phone].    Benito Mccreedy OTR/L [phone] 12/27/2014, 3:15 PM

## 2014-12-27 NOTE — Progress Notes (Signed)
    Patient doing well, L hip pain well controlled, slept well, anxious about moving L hip, has been up to bedside commode, NL B/B function, no SOB, no dizziness/lightheadedness reports sore throat from anesthesia, otherwise eating breakfast and without complaint  Physical Exam: BP 121/35 mmHg  Pulse 71  Temp(Src) 99.1 F (37.3 C) (Oral)  Resp 20  SpO2 98% CBC Latest Ref Rng 12/27/2014 12/26/2014 12/25/2014  WBC 4.0 - 10.5 K/uL 8.4 9.3 10.9(H)  Hemoglobin 12.0 - 15.0 g/dL 9.9(L) 11.1(L) 13.3  Hematocrit 36.0 - 46.0 % 30.5(L) 34.9(L) 41.1  Platelets 150 - 400 K/uL 146(L) 176 227   CMP Latest Ref Rng 12/27/2014 12/26/2014 12/25/2014  Glucose 65 - 99 mg/dL 153(H) 173(H) 178(H)  BUN 6 - 20 mg/dL 14 19 32(H)  Creatinine 0.44 - 1.00 mg/dL 0.71 0.78 1.13(H)  Sodium 135 - 145 mmol/L 135 137 138  Potassium 3.5 - 5.1 mmol/L 3.9 3.9 3.8  Chloride 101 - 111 mmol/L 95(L) 97(L) 96(L)  CO2 22 - 32 mmol/L 29 27 32  Calcium 8.9 - 10.3 mg/dL 8.0(L) 8.2(L) 8.8(L)  Total Protein 6.5 - 8.1 g/dL - 5.4(L) -  Total Bilirubin 0.3 - 1.2 mg/dL - 1.0 -  Alkaline Phos 38 - 126 U/L - 108 -  AST 15 - 41 U/L - 159(H) -  ALT 14 - 54 U/L - 170(H) -   Dressing in place, minimal ecchymosis, SCD's in place, distal compartments soft, 2+ DPP, NVI, pt resting comfortably in hospital bed  POD #1 s/p   - Followed by hospitalist for CAD (status post CABG), hypertension, hyperlipidemia, aortic stenosis, mitral valve disorder, chronic A. fib, COPD, CHF, stage III lung cancer currently receiving radiation and chemotherapy - up with PT/OT PWB, encouraged activity and ankle pumps  -Will go to SNF pending hospitalist  - Cont current px regimen, well controlled  - ASA 325 BID and SCD's for prophylaxis  - likely d/c to SNF when medically stable, clear from ortho standpoint

## 2014-12-27 NOTE — Progress Notes (Signed)
Physical Therapy Evaluation Patient Details Name: Marissa [name] MRN: [id number] DOB: [birth year] Today's Date: 12/27/2014   History of Present Illness  81 yo female, post fall getting out of bath at home with hip fracture.  ORIF on 10/7, L LE TTWB.  Other medical factors: COPD, CHF, CAD, Lung Cancer receiving radiation and chemo currently.  Clinical Impression  Patient presents with significant pain limiting function.  MAX assist for bed mobility and pivot transfer, unable to ambulate at this time.  Patient actively receiving radiation and chemotherapy for lung cancer, anticipate prolonged recovery due to co-morbid conditions and treatments.  Limited Weight Bearing status (TWB).  Patient was formerly highly independent and will benefit from skilled PT services focused on pain reduction, bed mobility, transfers, gait and w/c mobility for short term mobility.    Follow Up Recommendations SNF;Supervision/Assistance - 24 hour    Equipment Recommendations   (None currently, will continue to assess.)    Recommendations for Other Services OT consult     Precautions / Restrictions Precautions Precautions: Fall Restrictions Weight Bearing Restrictions: Yes LLE Weight Bearing: Touchdown weight bearing      Mobility  Bed Mobility Overal bed mobility: Needs Assistance Bed Mobility: Supine to Sit     Supine to sit: Max assist     General bed mobility comments: Slow, antalgic, cues for technique, HOB elevated.  Transfers Overall transfer level: Needs assistance   Transfers: Sit to/from Stand;Stand Pivot Transfers Sit to Stand: Max assist;+2 physical assistance Stand pivot transfers: Max assist;+2 physical assistance       General transfer comment: Severe antalgia, difficulty with WB compliance, Handheld physical assist.  Ambulation/Gait                Stairs            Wheelchair Mobility    Modified Rankin (Stroke Patients Only)       Balance Overall  balance assessment: Needs assistance Sitting-balance support: Bilateral upper extremity supported Sitting balance-Leahy Scale: Fair     Standing balance support: Bilateral upper extremity supported Standing balance-Leahy Scale: Poor                               Pertinent Vitals/Pain Pain Assessment: 0-10 Pain Score: 7  Pain Location: L hip Pain Descriptors / Indicators: Aching;Sore Pain Intervention(s): Limited activity within patient's tolerance;Monitored during session    Home Living Family/patient expects to be discharged to:: Skilled nursing facility Living Arrangements: Children (daughter)   Type of Home: House Home Access: Stairs to enter Entrance Stairs-Rails: None (wall) Entrance Stairs-Number of Steps: 4 Home Layout: One level        Prior Function Level of Independence: Independent               Hand Dominance        Extremity/Trunk Assessment   Upper Extremity Assessment: Overall WFL for tasks assessed           Lower Extremity Assessment: LLE deficits/detail   LLE Deficits / Details: Weak and painful, decreased ROM     Communication   Communication: No difficulties  Cognition Arousal/Alertness: Awake/alert Behavior During Therapy: WFL for tasks assessed/performed Overall Cognitive Status: Within Functional Limits for tasks assessed                      General Comments      Exercises General Exercises - Lower Extremity Ankle Circles/Pumps: AAROM;Both;10 reps;Supine Heel Slides:  AAROM;Both;10 reps;Supine Hip ABduction/ADduction: AAROM;Both;10 reps;Supine Straight Leg Raises: AAROM;Both;10 reps;Supine      Assessment/Plan    PT Assessment Patient needs continued PT services  PT Diagnosis Difficulty walking;Generalized weakness;Acute pain   PT Problem List Decreased strength;Decreased range of motion;Decreased activity tolerance;Decreased mobility;Decreased balance;Decreased knowledge of use of DME;Decreased  knowledge of precautions;Cardiopulmonary status limiting activity;Pain  PT Treatment Interventions DME instruction;Gait training;Functional mobility training;Stair training;Therapeutic activities;Therapeutic exercise;Wheelchair mobility training;Patient/family education   PT Goals (Current goals can be found in the Care Plan section) Acute Rehab PT Goals Patient Stated Goal: Continue getting radiation and chemo, get better. PT Goal Formulation: With patient Time For Goal Achievement: 01/10/15 Potential to Achieve Goals: Good    Frequency Min 4X/week   Barriers to discharge Inaccessible home environment Steps to enter home    Co-evaluation               End of Session Equipment Utilized During Treatment: Gait belt Activity Tolerance: Patient tolerated treatment well Patient left: in chair;with call bell/phone within reach;with nursing/sitter in room Nurse Communication: Mobility status;Precautions;Weight bearing status         Time: 3086-5784 PT Time Calculation (min) (ACUTE ONLY): 37 min   Charges:   PT Evaluation $Initial PT Evaluation Tier I: 1 Procedure PT Treatments $Therapeutic Activity: 8-22 mins   PT G CodesZenia Resides, Kilyn Maragh L 2014-12-31, 3:39 PM

## 2014-12-27 NOTE — Care Management Note (Signed)
Case Management Note  Patient Details  Name: Marissa [name] MRN: [id number] Date of Birth: 03-08-1934  Subjective/Objective: 81 yo F sustained a L hip intertrochanteric fx 3-part with varus displacement after falling at home; she underwent open reduction internal fixation left hip. She has stage III lung CA currently receiving radiation and chemo.          Action/Plan: order for HH/DME   Expected Discharge Date:                  Expected Discharge Plan:  [place]  In-House Referral:  Clinical Social Work  Discharge planning Services  CM Consult  Post Acute Care Choice:    Choice offered to:     DME Arranged:    DME Agency:     HH Arranged:    [place] Agency:     Status of Service:  In process, will continue to follow  Medicare Important Message Given:    Date Medicare IM Given:    Medicare IM give by:    Date Additional Medicare IM Given:   12/27/14 Additional Medicare Important Message give by:   Frann Rider, RN, BSN  If discussed at Long Length of Stay Meetings, dates discussed:    Additional Comments: met with pt to discuss d/c plan. She is not sure if she needs to go to SNF. She lives with her daughter. PT to see pt. Will f/u with PT recommendations.  Norina Buzzard, RN 12/27/2014, 1:20 PM

## 2014-12-27 NOTE — Progress Notes (Signed)
[facility] PROGRESS NOTE  [name] BMW:[id number] DOB: 02-11-1934 DOA: 12/25/2014 PCP: [name], MD  Assessment/Plan: 1. Left hip fracture. -Marissa [name] having a mechanical fall at home, reported slipping on the bathroom floor getting out of her tub. She denied symptoms prior to event. -Imaging studies revealed a left hip intertrochanteric fracture. -Orthopedic surgery was consulted as she was taken to the operating room on 12/26/2014 undergoing open reduction and internal fixation. Tolerated procedure well there were no immediate, location is. -Repeat lab work this morning showing hemoglobin of 9.9, down from 11.1.  -Physical therapy consult pending. Spoke with social work, will likely require skilled nursing facility placement   2.   Hypertension.  - blood pressures are well controlled  -continue Toprol-XL 50 mg by mouth daily and Lasix 20 mg by mouth daily   3. Atrial fibrillation CHADSVasc score of 4 -She previously was anticoagulated, however, was taken off of anticoagulation due to bleeding complications and her own wishes not to restart. She was on aspirin therapy prior to this hospitalization. -Continue metoprolol and digoxin  - she remains rate controlled having ventricular rates in the 70s.   4.  Stage IIIa non-small cell lung cancer -Recently presented with recurrence on the right lung -She is being seen at the cancer center, followed by Dr. Julien Nordmann,  undergoing concurrent chemoradiation therapy with weekly carboplatin and paclitaxel  5.   History of coronary artery disease.  -Patient with history of coronary artery disease status post coronary artery bypass grafting with redo bypass in 2013 -She does not appear to have active vaginal issues at this time    6.    History of valvular disease -Status post porcine aortic  valve replacement in 2013  -Status post mitral valve replacement   7.  Hypothyroidism.  - Will check a TSH, meanwhile continue  Synthroid 100 g by mouth daily   Code Status: full code  Family Communication: family not present  Disposition Plan: anticipate patient will require skilled nursing facility placement    Consultants:  Orthopedic surgery    physical therapy  Procedures:  Open reduction and internal fixation of left hip fracture performed on 12/26/2014     HPI/Subjective: Marissa [name] is a pleasant 81 year old female with a history of coronary artery disease, hypertension, dyslipidemia, uric stenosis, chronic atrial fibrillation who was admitted to the medicine service on 12/26/2014 when she presented after having a fall at home. She reported being in the usual state health, had a fall while getting out of her bathtub landing on her left hip. She reported significant pain unable to bear weight. Films in the emergency department revealed a closed comminuted left intertrochanteric femoral fracture. Orthopedic surgery was consulted as she was taken to the operating room on 12/26/2014 undergoing open reduction internal fixation of left hip intertrochanteric fracture. Tolerated procedure well there no immediate complications.  Objective: Filed Vitals:   12/27/14 0916  BP:   Pulse: 74  Temp:   Resp:     Intake/Output Summary (Last 24 hours) at 12/27/14 1148 Last data filed at 12/27/14 0900  Gross per 24 hour  Intake    120 ml  Output    500 ml  Net   -380 ml   There were no vitals filed for this visit.  Exam:   General:  Patient is in no acute distress she is awake and alert, states her pain is well controlled  Cardiovascular: Regular rate and rhythm normal S1-S2 no murmurs or gallops  Respiratory: Normal  respiratory effort, lungs are clear to auscultation bilaterally  Abdomen: Soft nontender nondistended positive bowel sounds  Musculoskeletal: Surgical incision site was inspected, is no evidence of any complications or infection  Data Reviewed: Basic Metabolic Panel:  Recent  Labs Lab 12/22/14 0845 12/25/14 2238 12/26/14 0153 12/26/14 1450 12/27/14 0421  NA 139 138  --  137 135  K 3.5 3.8  --  3.9 3.9  CL  --  96*  --  97* 95*  CO2 26 32  --  27 29  GLUCOSE 202* 178*  --  173* 153*  BUN 21.1 32*  --  19 14  CREATININE 0.8 1.13*  --  0.78 0.71  CALCIUM 9.6 8.8*  --  8.2* 8.0*  MG  --   --  1.9  --   --   PHOS  --   --  3.1  --   --    Liver Function Tests:  Recent Labs Lab 12/22/14 0845 12/26/14 1450  AST 32 159*  ALT 30 170*  ALKPHOS 78 108  BILITOT 0.58 1.0  PROT 6.4 5.4*  ALBUMIN 3.1* 2.7*   No results for input(s): LIPASE, AMYLASE in the last 168 hours. No results for input(s): AMMONIA in the last 168 hours. CBC:  Recent Labs Lab 12/22/14 0844 12/25/14 2238 12/26/14 1450 12/27/14 0421  WBC 9.2 10.9* 9.3 8.4  NEUTROABS 7.5* 9.3*  --   --   HGB 12.1 13.3 11.1* 9.9*  HCT 37.1 41.1 34.9* 30.5*  MCV 86.1 86.2 86.4 86.6  PLT 207 227 176 146*   Cardiac Enzymes: No results for input(s): CKTOTAL, CKMB, CKMBINDEX, TROPONINI in the last 168 hours. BNP (last 3 results) No results for input(s): BNP in the last 8760 hours.  ProBNP (last 3 results) No results for input(s): PROBNP in the last 8760 hours.  CBG:  Recent Labs Lab 12/26/14 0800 12/26/14 1631 12/26/14 2126 12/27/14 0642  GLUCAP 178* 147* 234* 149*    Recent Results (from the past 240 hour(s))  MRSA PCR Screening     Status: None   Collection Time: 12/26/14  8:39 AM  Result Value Ref Range Status   MRSA by PCR NEGATIVE NEGATIVE Final    Comment:        The GeneXpert MRSA Assay (FDA approved for NASAL specimens only), is one component of a comprehensive MRSA colonization surveillance program. It is not intended to diagnose MRSA infection nor to guide or monitor treatment for MRSA infections.      Studies: Dg Chest 1 View  12/25/2014   CLINICAL DATA:  LEFT hip fracture.  EXAM: CHEST 1 VIEW  COMPARISON:  Chest radiograph November 21, 2014 and CT chest  November 20, 2014.  FINDINGS: Masslike consolidation RIGHT upper lobe, decreased from prior study. Similar pulmonary vascular congestion and fullness of the RIGHT hilum corresponding to lymphadenopathy on recent CT chest. The cardiac silhouette is mildly enlarged. Status post median sternotomy for cardiac valve replacement and CABG. Similar LEFT lung base atelectasis/ scarring. No pleural effusion. No pneumothorax. Partially imaged screw in RIGHT humeral head.  IMPRESSION: Improving RIGHT upper lobe consolidation/mass, recommend continued surveillance.  Mild cardiomegaly, pulmonary vascular congestion.   Electronically Signed   By: Elon Alas M.D.   On: 12/25/2014 22:42   Pelvis Portable  12/26/2014   CLINICAL DATA:  ORIF left hip.  EXAM: PORTABLE PELVIS 1-2 VIEWS  COMPARISON:  PET-CT 11/10/2014.  FINDINGS: ORIF left hip with good anatomic alignment on AP view. Hardware intact. Surgical clips  in the pelvis.  IMPRESSION: ORIF left hip with good anatomic alignment on AP view.   Electronically Signed   By: Marcello Moores  Register   On: 12/26/2014 12:44   Dg Hip Operative Unilat With Pelvis Left  12/26/2014   CLINICAL DATA:  Intraoperative fluoro spot images from ORIF of the left hip  EXAM: OPERATIVE left HIP (WITH PELVIS IF PERFORMED) 2 VIEWS  TECHNIQUE: Fluoroscopic spot image(s) were submitted for interpretation post-operatively. Fluoro time reported is 1 minutes, 4 seconds  COMPARISON:  Preoperative left hip series of December 25, 2014  FINDINGS: The patient has undergone telescoping nail and sideplate fixation of an intertrochanteric fracture. Alignment of the fracture fragments is more near anatomic. There is avulsion of the lesser trochanter.  IMPRESSION: The patient has undergone ORIF for an intertrochanteric fracture of the left hip. There is no immediate postprocedure complication.   Electronically Signed   By: David  Martinique M.D.   On: 12/26/2014 11:43   Dg Hip Unilat With Pelvis 2-3 Views  Left  12/25/2014   CLINICAL DATA:  Fall  EXAM: DG HIP (WITH OR WITHOUT PELVIS) 2-3V LEFT  COMPARISON:  None.  FINDINGS: Comminuted intertrochanteric left femur fracture. Lesser trochanter is displaced. Greater trochanter is also displaced. Slight varus deformity. Postop changes in the pelvis. Advanced lumbar spine degenerative change.  IMPRESSION: Comminuted left intertrochanteric femur fracture.   Electronically Signed   By: Marybelle Killings M.D.   On: 12/25/2014 22:39    Scheduled Meds: . aspirin EC  325 mg Oral BID WC  . atorvastatin  40 mg Oral q1800  . digoxin  0.125 mg Oral Daily  . docusate sodium  100 mg Oral BID  . enoxaparin (LOVENOX) injection  40 mg Subcutaneous Q24H  . feeding supplement (ENSURE ENLIVE)  237 mL Oral BID BM  . furosemide  20 mg Oral QPM  . furosemide  40 mg Oral Q0600  . glimepiride  1 mg Oral Q breakfast  . levothyroxine  100 mcg Oral QAC breakfast  . metoprolol succinate  50 mg Oral QPM  . mometasone-formoterol  2 puff Inhalation BID  . pantoprazole  40 mg Oral Daily  . potassium chloride SA  20 mEq Oral Daily  . rOPINIRole  4 mg Oral QHS  . sodium chloride  3 mL Intravenous Q12H  . vitamin C  500 mg Oral Daily   Continuous Infusions: . sodium chloride 10 mL/hr at 12/26/14 0238  . dextrose 5 % and 0.45 % NaCl with KCl 20 mEq/L 100 mL/hr (12/27/14 0702)  . lactated ringers 50 mL/hr at 12/26/14 7681    Principal Problem:   Closed left hip fracture (HCC) Active Problems:   HTN (hypertension)   Hypercholesteremia   CAD (coronary artery disease)   Aortic valve disorder   RLS (restless legs syndrome)   A-fib (HCC)   Mitral valve disease   Hypothyroidism   COPD (chronic obstructive pulmonary disease) (HCC)   CHF (congestive heart failure) ([place])   Primary cancer of right upper lobe of lung ([place])   Type 2 diabetes mellitus ([place])    Time spent: 35 min    Kelvin Cellar  [facility] Pager [phone]. If 7PM-7AM, please contact night-coverage  at [email or web address], password [facility] 12/27/2014, 11:48 AM  LOS: 2 days

## 2014-12-28 DIAGNOSIS — I482 Chronic atrial fibrillation: Secondary | ICD-10-CM

## 2014-12-28 DIAGNOSIS — I10 Essential (primary) hypertension: Secondary | ICD-10-CM

## 2014-12-28 LAB — CBC
HCT: 30.5 % — ABNORMAL LOW (ref 36.0–46.0)
HEMOGLOBIN: 9.6 g/dL — AB (ref 12.0–15.0)
MCH: 27.2 pg (ref 26.0–34.0)
MCHC: 31.5 g/dL (ref 30.0–36.0)
MCV: 86.4 fL (ref 78.0–100.0)
Platelets: 172 10*3/uL (ref 150–400)
RBC: 3.53 MIL/uL — ABNORMAL LOW (ref 3.87–5.11)
RDW: 17.3 % — ABNORMAL HIGH (ref 11.5–15.5)
WBC: 7.5 10*3/uL (ref 4.0–10.5)

## 2014-12-28 LAB — BASIC METABOLIC PANEL
Anion gap: 9 (ref 5–15)
BUN: 14 mg/dL (ref 6–20)
CALCIUM: 7.9 mg/dL — AB (ref 8.9–10.3)
CHLORIDE: 93 mmol/L — AB (ref 101–111)
CO2: 29 mmol/L (ref 22–32)
CREATININE: 0.95 mg/dL (ref 0.44–1.00)
GFR, EST NON AFRICAN AMERICAN: 55 mL/min — AB (ref >60)
Glucose, Bld: 227 mg/dL — ABNORMAL HIGH (ref 65–99)
Potassium: 3.8 mmol/L (ref 3.5–5.1)
SODIUM: 131 mmol/L — AB (ref 135–145)

## 2014-12-28 LAB — GLUCOSE, CAPILLARY
GLUCOSE-CAPILLARY: 135 mg/dL — AB (ref 65–99)
GLUCOSE-CAPILLARY: 172 mg/dL — AB (ref 65–99)
GLUCOSE-CAPILLARY: 174 mg/dL — AB (ref 65–99)
Glucose-Capillary: 221 mg/dL — ABNORMAL HIGH (ref 65–99)

## 2014-12-28 MED ORDER — IPRATROPIUM-ALBUTEROL 0.5-2.5 (3) MG/3ML IN SOLN
3.0000 mL | Freq: Three times a day (TID) | RESPIRATORY_TRACT | Status: DC
  Administered 2014-12-28 – 2014-12-29 (×4): 3 mL via RESPIRATORY_TRACT
  Filled 2014-12-28 (×3): qty 3

## 2014-12-28 MED ORDER — IPRATROPIUM-ALBUTEROL 0.5-2.5 (3) MG/3ML IN SOLN
RESPIRATORY_TRACT | Status: AC
  Filled 2014-12-28: qty 3

## 2014-12-28 NOTE — Progress Notes (Signed)
[facility] PROGRESS NOTE  AYN DOMANGUE WJX:[id number] DOB: 27-Jul-1933 DOA: 12/25/2014 PCP: [name], MD  Marissa [name] is a pleasant 81 year old female with a history of coronary artery disease, hypertension, dyslipidemia, uric stenosis, chronic atrial fibrillation who was admitted to the medicine service on 12/26/2014 when she presented after having a fall at home. She reported being in the usual state health, had a fall while getting out of her bathtub landing on her left hip. She reported significant pain unable to bear weight. Films in the emergency department revealed a closed comminuted left intertrochanteric femoral fracture. Orthopedic surgery was consulted as she was taken to the operating room on 12/26/2014 undergoing open reduction internal fixation of left hip intertrochanteric fracture. Tolerated procedure well there no immediate complications.  Assessment/Plan: 1. Left hip fracture. -left hip intertrochanteric fracture. -Orthopedic surgery was consulted as she was taken to the operating room on 12/26/2014 undergoing open reduction and internal fixation. Tolerated procedure well there were no immediate complications -SNF placement  2.   Hypertension.  - blood pressures are well controlled  -continue Toprol-XL 50 mg by mouth daily and Lasix 20 mg by mouth daily   3. Atrial fibrillation CHADSVasc score of 4 -She previously was anticoagulated, however, was taken off of anticoagulation due to bleeding complications and her own wishes not to restart. She was on aspirin therapy prior to this hospitalization. -Continue metoprolol and digoxin  - she remains rate controlled having ventricular rates in the 70s.   4.  Stage IIIa non-small cell lung cancer -Recently presented with recurrence on the right lung -She is being seen at the cancer center, followed by Dr. Julien Nordmann,  undergoing concurrent chemoradiation therapy with weekly carboplatin and paclitaxel  5.    History of coronary artery disease.  -Patient with history of coronary artery disease status post coronary artery bypass grafting with redo bypass in 2013 -She does not appear to have active vaginal issues at this time    6.    History of valvular disease -Status post porcine aortic  valve replacement in 2013  -Status post mitral valve replacement   7.  Hypothyroidism.  - Will check a TSH, meanwhile continue Synthroid 100 g by mouth daily   8. ABLA -expected after surgery, continue to monitor   Code Status: full code  Family Communication: family not present, patient updated with no questions Disposition Plan: SNF   Consultants:  Orthopedic surgery    physical therapy  Procedures:  Open reduction and internal fixation of left hip fracture performed on 12/26/2014     HPI/Subjective: no pain at surgical site Ate breakfast No nausea   Objective: Filed Vitals:   12/28/14 0533  BP: 110/34  Pulse: 50  Temp: 98.1 F (36.7 C)  Resp: 14    Intake/Output Summary (Last 24 hours) at 12/28/14 0845 Last data filed at 12/28/14 0841  Gross per 24 hour  Intake    920 ml  Output    200 ml  Net    720 ml   Filed Weights   12/27/14 2200  Weight: 63.05 kg (139 lb)    Exam:   General:  NAD, A+Ox3  Cardiovascular: Regular rate and rhythm normal S1-S2 no murmurs or gallops  Respiratory: Normal respiratory effort, lungs are clear to auscultation bilaterally  Abdomen: Soft nontender nondistended positive bowel sounds  Musculoskeletal: Surgical incision site was inspected, is no evidence of any complications or infection  Data Reviewed: Basic Metabolic Panel:  Recent Labs Lab 12/22/14 0845 12/25/14 2238  12/26/14 0153 12/26/14 1450 12/27/14 0421  NA 139 138  --  137 135  K 3.5 3.8  --  3.9 3.9  CL  --  96*  --  97* 95*  CO2 26 32  --  27 29  GLUCOSE 202* 178*  --  173* 153*  BUN 21.1 32*  --  19 14  CREATININE 0.8 1.13*  --  0.78 0.71  CALCIUM 9.6 8.8*   --  8.2* 8.0*  MG  --   --  1.9  --   --   PHOS  --   --  3.1  --   --    Liver Function Tests:  Recent Labs Lab 12/22/14 0845 12/26/14 1450  AST 32 159*  ALT 30 170*  ALKPHOS 78 108  BILITOT 0.58 1.0  PROT 6.4 5.4*  ALBUMIN 3.1* 2.7*   No results for input(s): LIPASE, AMYLASE in the last 168 hours. No results for input(s): AMMONIA in the last 168 hours. CBC:  Recent Labs Lab 12/22/14 0844 12/25/14 2238 12/26/14 1450 12/27/14 0421  WBC 9.2 10.9* 9.3 8.4  NEUTROABS 7.5* 9.3*  --   --   HGB 12.1 13.3 11.1* 9.9*  HCT 37.1 41.1 34.9* 30.5*  MCV 86.1 86.2 86.4 86.6  PLT 207 227 176 146*   Cardiac Enzymes: No results for input(s): CKTOTAL, CKMB, CKMBINDEX, TROPONINI in the last 168 hours. BNP (last 3 results) No results for input(s): BNP in the last 8760 hours.  ProBNP (last 3 results) No results for input(s): PROBNP in the last 8760 hours.  CBG:  Recent Labs Lab 12/27/14 0642 12/27/14 1157 12/27/14 1617 12/27/14 2153 12/28/14 0628  GLUCAP 149* 214* 147* 151* 135*    Recent Results (from the past 240 hour(s))  MRSA PCR Screening     Status: None   Collection Time: 12/26/14  8:39 AM  Result Value Ref Range Status   MRSA by PCR NEGATIVE NEGATIVE Final    Comment:        The GeneXpert MRSA Assay (FDA approved for NASAL specimens only), is one component of a comprehensive MRSA colonization surveillance program. It is not intended to diagnose MRSA infection nor to guide or monitor treatment for MRSA infections.      Studies: Pelvis Portable  12/26/2014   CLINICAL DATA:  ORIF left hip.  EXAM: PORTABLE PELVIS 1-2 VIEWS  COMPARISON:  PET-CT 11/10/2014.  FINDINGS: ORIF left hip with good anatomic alignment on AP view. Hardware intact. Surgical clips in the pelvis.  IMPRESSION: ORIF left hip with good anatomic alignment on AP view.   Electronically Signed   By: Marcello Moores  Register   On: 12/26/2014 12:44   Dg Hip Operative Unilat With Pelvis Left  12/26/2014    CLINICAL DATA:  Intraoperative fluoro spot images from ORIF of the left hip  EXAM: OPERATIVE left HIP (WITH PELVIS IF PERFORMED) 2 VIEWS  TECHNIQUE: Fluoroscopic spot image(s) were submitted for interpretation post-operatively. Fluoro time reported is 1 minutes, 4 seconds  COMPARISON:  Preoperative left hip series of December 25, 2014  FINDINGS: The patient has undergone telescoping nail and sideplate fixation of an intertrochanteric fracture. Alignment of the fracture fragments is more near anatomic. There is avulsion of the lesser trochanter.  IMPRESSION: The patient has undergone ORIF for an intertrochanteric fracture of the left hip. There is no immediate postprocedure complication.   Electronically Signed   By: David  Martinique M.D.   On: 12/26/2014 11:43    Scheduled Meds: . aspirin  EC  325 mg Oral BID WC  . atorvastatin  40 mg Oral q1800  . digoxin  0.125 mg Oral Daily  . docusate sodium  100 mg Oral BID  . enoxaparin (LOVENOX) injection  40 mg Subcutaneous Q24H  . feeding supplement (ENSURE ENLIVE)  237 mL Oral BID BM  . furosemide  20 mg Oral QPM  . furosemide  40 mg Oral Q0600  . glimepiride  1 mg Oral Q breakfast  . levothyroxine  100 mcg Oral QAC breakfast  . metoprolol succinate  50 mg Oral QPM  . mometasone-formoterol  2 puff Inhalation BID  . pantoprazole  40 mg Oral Daily  . potassium chloride SA  20 mEq Oral Daily  . rOPINIRole  4 mg Oral QHS  . sodium chloride  3 mL Intravenous Q12H  . vitamin C  500 mg Oral Daily   Continuous Infusions: . sodium chloride 10 mL/hr at 12/26/14 0238  . dextrose 5 % and 0.45 % NaCl with KCl 20 mEq/L 100 mL/hr (12/27/14 0702)  . lactated ringers 50 mL/hr at 12/26/14 8472    Principal Problem:   Closed left hip fracture (HCC) Active Problems:   HTN (hypertension)   Hypercholesteremia   CAD (coronary artery disease)   Aortic valve disorder   RLS (restless legs syndrome)   A-fib (HCC)   Mitral valve disease   Hypothyroidism   COPD  (chronic obstructive pulmonary disease) (HCC)   CHF (congestive heart failure) ([place])   Primary cancer of right upper lobe of lung ([place])   Type 2 diabetes mellitus ([place])    Time spent: 25 min    Sedra Morfin  [facility] Pager [phone]. If 7PM-7AM, please contact night-coverage at [email or web address], password [facility] 12/28/2014, 8:45 AM  LOS: 3 days

## 2014-12-28 NOTE — Progress Notes (Signed)
Physical Therapy Treatment Patient Details Name: Marissa [name] MRN: [id number] DOB: 03-Oct-1933 Today's Date: 12/28/2014    History of Present Illness 81 yo female, post fall getting out of bath at home with hip fracture.  ORIF on 10/7, L LE TTWB.  Other medical factors: COPD, CHF, CAD, Lung Cancer receiving radiation and chemo currently.    PT Comments    Patient progressing slowly towards PT goals. Continues to require Min-Mod A for mobility and transfers and cues to adhere to WB status during mobility. Tolerated taking a few steps to transfer to chair however dyspneic post transfer with Sp02 90% on supplemental 02. Encouraged exercise 3x/day. Daughter concerned about pt continuing her chemo/radiation for lung ca when at rehab. Will follow acutely to maximize independence and mobility.  Follow Up Recommendations  SNF;Supervision/Assistance - 24 hour     Equipment Recommendations  None recommended by PT    Recommendations for Other Services       Precautions / Restrictions Precautions Precautions: Fall Restrictions Weight Bearing Restrictions: Yes LLE Weight Bearing: Partial weight bearing    Mobility  Bed Mobility Overal bed mobility: Needs Assistance Bed Mobility: Supine to Sit     Supine to sit: HOB elevated;Min assist     General bed mobility comments: Assist bringing BLEs to EOB. increased time and effort. Use of rails for support. Cues for technique.  Transfers Overall transfer level: Needs assistance Equipment used: Rolling walker (2 wheeled) Transfers: Sit to/from Stand Sit to Stand: Mod assist Stand pivot transfers: Mod assist       General transfer comment: Stood from EOB x1 with cues for hand/foot placement and technique. Cues to adhere to WB precautions. SPT bed to chair with Mod A for balance and cues for PWB LLE. Dyspnea present. Sp02 90% on 3L/min 02.  Ambulation/Gait Ambulation/Gait assistance: Mod assist Ambulation Distance (Feet): 2  Feet Assistive device: Rolling walker (2 wheeled) Gait Pattern/deviations: Step-to pattern;Decreased stride length;Decreased weight shift to left;Decreased stance time - left;Narrow base of support;Shuffle;Trunk flexed Gait velocity: decreased   General Gait Details: Able to take a few shuffling steps to chair with Mod A for balance and cues to adhere to WB status. Dyspnea present.   Stairs            Wheelchair Mobility    Modified Rankin (Stroke Patients Only)       Balance Overall balance assessment: Needs assistance Sitting-balance support: Feet supported;Bilateral upper extremity supported Sitting balance-Leahy Scale: Fair     Standing balance support: During functional activity;Bilateral upper extremity supported Standing balance-Leahy Scale: Poor Standing balance comment: Relient on RW for support and to adhere to WB precautions.                    Cognition Arousal/Alertness: Awake/alert Behavior During Therapy: WFL for tasks assessed/performed Overall Cognitive Status: Within Functional Limits for tasks assessed                      Exercises General Exercises - Lower Extremity Ankle Circles/Pumps: Both;10 reps;Supine Quad Sets: Both;10 reps;Supine Gluteal Sets: Both;10 reps;Supine Long Arc Quad: Left;10 reps;Seated    General Comments General comments (skin integrity, edema, etc.): Daughter present in room. Concerned about pt getting to her chemo treatments. Wants a ST SNF [facility] to [facility].      Pertinent Vitals/Pain Pain Assessment: Faces Faces Pain Scale: Hurts even more Pain Location: left hip Pain Descriptors / Indicators: Sore;Aching Pain Intervention(s): Monitored during session;Repositioned;Limited activity within patient's  tolerance    Home Living                      Prior Function            PT Goals (current goals can now be found in the care plan section) Progress towards PT goals: Progressing toward  goals    Frequency  Min 3X/week    PT Plan Frequency needs to be updated    Co-evaluation             End of Session Equipment Utilized During Treatment: Gait belt;Oxygen Activity Tolerance: Patient tolerated treatment well Patient left: in chair;with call bell/phone within reach;with family/visitor present     Time: 9357-0177 PT Time Calculation (min) (ACUTE ONLY): 21 min  Charges:  $Therapeutic Activity: 8-22 mins                    G Codes:      Jorah Hua A Alonzo Owczarzak 12/28/2014, 2:24 PM  Wray Kearns, [place], DPT [phone]

## 2014-12-28 NOTE — Progress Notes (Signed)
    Patient doing well, slept well, difficult time in PT yesterday secondary to pain and fatigue. Pt denies px at the moment, states better controlled through the night and today. NL B/B function, denies SOB/dizziness/lightheadedness, improved sore throat.  BP 110/34 mmHg  Pulse 50  Temp(Src) 98.1 F (36.7 C) (Oral)  Resp 14  Ht '5\' 1"'$  (1.549 m)  Wt 63.05 kg (139 lb)  BMI 26.28 kg/m2  SpO2 100%   CBC Latest Ref Rng 12/27/2014 12/26/2014 12/25/2014  WBC 4.0 - 10.5 K/uL 8.4 9.3 10.9(H)  Hemoglobin 12.0 - 15.0 g/dL 9.9(L) 11.1(L) 13.3  Hematocrit 36.0 - 46.0 % 30.5(L) 34.9(L) 41.1  Platelets 150 - 400 K/uL 146(L) 176 227    CMP Latest Ref Rng 12/27/2014 12/26/2014 12/25/2014  Glucose 65 - 99 mg/dL 153(H) 173(H) 178(H)  BUN 6 - 20 mg/dL 14 19 32(H)  Creatinine 0.44 - 1.00 mg/dL 0.71 0.78 1.13(H)  Sodium 135 - 145 mmol/L 135 137 138  Potassium 3.5 - 5.1 mmol/L 3.9 3.9 3.8  Chloride 101 - 111 mmol/L 95(L) 97(L) 96(L)  CO2 22 - 32 mmol/L 29 27 32  Calcium 8.9 - 10.3 mg/dL 8.0(L) 8.2(L) 8.8(L)  Total Protein 6.5 - 8.1 g/dL - 5.4(L) -  Total Bilirubin 0.3 - 1.2 mg/dL - 1.0 -  Alkaline Phos 38 - 126 U/L - 108 -  AST 15 - 41 U/L - 159(H) -  ALT 14 - 54 U/L - 170(H) -   Dressing in place, pt resting comfortably, minimal ecchymosis, non tender to direct palpation, SCD's in place, distal compartments soft, 2+ DPP, NVI, minimal discomfort with passive ROM L hip  POD #2 s/p L Hip ORIF per Dr Mayer Camel  - Followed by hospitalist for CAD (status post CABG), hypertension, hyperlipidemia, aortic stenosis, mitral valve disorder, chronic A. fib, COPD, CHF, stage III lung cancer currently receiving radiation and chemotherapy - up with PT/OT PWB, encouraged activity and ankle pumps -Will go to SNF pending hospitalist  - Cont current px regimen, well controlled  - ASA 325 BID and SCD's for prophylaxis  - likely d/c to SNF when medically stable, clear from ortho standpoint

## 2014-12-28 NOTE — Clinical Social Work Note (Signed)
Clinical Social Work Assessment  Patient Details  Name: Marissa [name] MRN: [id number] Date of Birth: [birth year]  Date of referral:  12/28/14               Reason for consult:  Facility Placement                Permission sought to share information with:  Case Manager, Family Supports Permission granted to share information::  Yes, Verbal Permission Granted  Name::     Programmer, systems   Agency::     Relationship::  Daughter   Contact Information:  [phone]  Housing/Transportation Living arrangements for the past 2 months:  Single Family Home (Per patient, lives at home with daughter) Source of Information:  Patient, Adult Children Patient Interpreter Needed:  None Criminal Activity/Legal Involvement Pertinent to Current Situation/Hospitalization:  No - Comment as needed Significant Relationships:  Adult Children Lives with:  Adult Children Do you feel safe going back to the place where you live?  Yes Need for family participation in patient care:  Yes (Comment)  Care giving concerns:     Facilities manager / plan: CSW received consult to speak with patient regarding PT/OT recommendation for short term rehab. CSW introduced self and acknowledged the patient. Patient was alert and oriented to self and time. Patient's daughter [name] walked into the room, and CSW asked patient to confirm if it was fine to share information. Patient agreed. Daughter expressed concerns regarding her mother receiving the treatment that she needs related to her lung cancer. Daughter reports frustration about the patient being transferred from [facility] long hospital. Daughter requested that CSW get the patient back to [place] for her chemo and radiation treatment.   CSW informed daughter of PT recommendation for short term, and informed her that most SNF's will provide transportation for the patient to get to treatment. Daughter continued to explore ways to get patient back to [place], however,  was advised to speak to unit RN or MD regarding this issue.   Daughter and patient are both agreeable to short term rehab for the patient. CSW was advised to only look for SNF's in the [facility] area. CSW informed the patient and daughter of process, and was provided verbal consent to fax patient clinicals out to [facility]. CSW also provided patient and daughter with a list of facilities in the area.   CSW to complete FL2 and handoff to unit social worker. Patient and daughter to be informed of bed offers once received.    Employment status:  Disabled (Comment on whether or not currently receiving Disability) Insurance information:  Medicare PT Recommendations:  [place] / Referral to community resources:  [place], Acute Rehab  Patient/Family's Response to care:  Daughter was Patent attorney of resources provided  Patient/Family's Understanding of and Emotional Response to Diagnosis, Current Treatment, and Prognosis:  Patient and daughter are aware of PT recommendations and express understanding of current treatment.   Emotional Assessment Appearance:  Appears stated age Attitude/Demeanor/Rapport:  Complaining Affect (typically observed):  Agitated, Anxious Orientation:  Oriented to Self, Oriented to  Time, Oriented to Situation Alcohol / Substance use:  Not Applicable Psych involvement (Current and /or in the community):  No (Comment)  Discharge Needs  Concerns to be addressed:  Discharge Planning Concerns Readmission within the last 30 days:  No Current discharge risk:  None Barriers to Discharge:  Barriers Resolved   Raymondo Band, LCSW 12/28/2014, 3:39 PM

## 2014-12-29 ENCOUNTER — Encounter (HOSPITAL_COMMUNITY): Payer: Self-pay | Admitting: Orthopedic Surgery

## 2014-12-29 ENCOUNTER — Other Ambulatory Visit: Payer: Self-pay

## 2014-12-29 ENCOUNTER — Telehealth: Payer: Self-pay | Admitting: Radiation Oncology

## 2014-12-29 ENCOUNTER — Ambulatory Visit: Payer: Medicare Other

## 2014-12-29 ENCOUNTER — Ambulatory Visit
Admit: 2014-12-29 | Discharge: 2014-12-29 | Disposition: A | Discharge status: Home / Self Care | Payer: Medicare Other | Attending: Radiation Oncology | Admitting: Radiation Oncology

## 2014-12-29 ENCOUNTER — Ambulatory Visit: Payer: Self-pay

## 2014-12-29 DIAGNOSIS — S72142A Displaced intertrochanteric fracture of left femur, initial encounter for closed fracture: Secondary | ICD-10-CM | POA: Diagnosis not present

## 2014-12-29 DIAGNOSIS — I509 Heart failure, unspecified: Secondary | ICD-10-CM | POA: Diagnosis not present

## 2014-12-29 DIAGNOSIS — S72002A Fracture of unspecified part of neck of left femur, initial encounter for closed fracture: Secondary | ICD-10-CM | POA: Diagnosis not present

## 2014-12-29 DIAGNOSIS — E034 Atrophy of thyroid (acquired): Secondary | ICD-10-CM | POA: Diagnosis not present

## 2014-12-29 DIAGNOSIS — J449 Chronic obstructive pulmonary disease, unspecified: Secondary | ICD-10-CM | POA: Diagnosis not present

## 2014-12-29 DIAGNOSIS — R2681 Unsteadiness on feet: Secondary | ICD-10-CM | POA: Diagnosis not present

## 2014-12-29 DIAGNOSIS — I25810 Atherosclerosis of coronary artery bypass graft(s) without angina pectoris: Secondary | ICD-10-CM | POA: Diagnosis not present

## 2014-12-29 DIAGNOSIS — Z9181 History of falling: Secondary | ICD-10-CM | POA: Diagnosis not present

## 2014-12-29 DIAGNOSIS — E119 Type 2 diabetes mellitus without complications: Secondary | ICD-10-CM | POA: Diagnosis not present

## 2014-12-29 DIAGNOSIS — R0602 Shortness of breath: Secondary | ICD-10-CM | POA: Diagnosis not present

## 2014-12-29 DIAGNOSIS — E038 Other specified hypothyroidism: Secondary | ICD-10-CM | POA: Diagnosis not present

## 2014-12-29 DIAGNOSIS — M21252 Flexion deformity, left hip: Secondary | ICD-10-CM | POA: Diagnosis not present

## 2014-12-29 DIAGNOSIS — D62 Acute posthemorrhagic anemia: Secondary | ICD-10-CM | POA: Diagnosis not present

## 2014-12-29 DIAGNOSIS — N390 Urinary tract infection, site not specified: Secondary | ICD-10-CM | POA: Diagnosis not present

## 2014-12-29 DIAGNOSIS — I110 Hypertensive heart disease with heart failure: Secondary | ICD-10-CM | POA: Diagnosis not present

## 2014-12-29 DIAGNOSIS — I10 Essential (primary) hypertension: Secondary | ICD-10-CM | POA: Diagnosis not present

## 2014-12-29 DIAGNOSIS — R319 Hematuria, unspecified: Secondary | ICD-10-CM | POA: Diagnosis not present

## 2014-12-29 DIAGNOSIS — C3490 Malignant neoplasm of unspecified part of unspecified bronchus or lung: Secondary | ICD-10-CM | POA: Diagnosis not present

## 2014-12-29 DIAGNOSIS — S72002D Fracture of unspecified part of neck of left femur, subsequent encounter for closed fracture with routine healing: Secondary | ICD-10-CM | POA: Diagnosis not present

## 2014-12-29 DIAGNOSIS — I482 Chronic atrial fibrillation: Secondary | ICD-10-CM | POA: Diagnosis not present

## 2014-12-29 DIAGNOSIS — Z5111 Encounter for antineoplastic chemotherapy: Secondary | ICD-10-CM | POA: Diagnosis not present

## 2014-12-29 DIAGNOSIS — S72142D Displaced intertrochanteric fracture of left femur, subsequent encounter for closed fracture with routine healing: Secondary | ICD-10-CM | POA: Diagnosis not present

## 2014-12-29 DIAGNOSIS — I2510 Atherosclerotic heart disease of native coronary artery without angina pectoris: Secondary | ICD-10-CM | POA: Diagnosis not present

## 2014-12-29 DIAGNOSIS — Z510 Encounter for antineoplastic radiation therapy: Secondary | ICD-10-CM | POA: Diagnosis not present

## 2014-12-29 DIAGNOSIS — I080 Rheumatic disorders of both mitral and aortic valves: Secondary | ICD-10-CM | POA: Diagnosis not present

## 2014-12-29 DIAGNOSIS — M6281 Muscle weakness (generalized): Secondary | ICD-10-CM | POA: Diagnosis not present

## 2014-12-29 DIAGNOSIS — Z9981 Dependence on supplemental oxygen: Secondary | ICD-10-CM | POA: Diagnosis not present

## 2014-12-29 DIAGNOSIS — C3411 Malignant neoplasm of upper lobe, right bronchus or lung: Secondary | ICD-10-CM | POA: Diagnosis not present

## 2014-12-29 DIAGNOSIS — Z79899 Other long term (current) drug therapy: Secondary | ICD-10-CM | POA: Diagnosis not present

## 2014-12-29 DIAGNOSIS — R300 Dysuria: Secondary | ICD-10-CM | POA: Diagnosis not present

## 2014-12-29 DIAGNOSIS — G9389 Other specified disorders of brain: Secondary | ICD-10-CM | POA: Diagnosis not present

## 2014-12-29 LAB — BASIC METABOLIC PANEL
ANION GAP: 9 (ref 5–15)
BUN: 17 mg/dL (ref 6–20)
CO2: 31 mmol/L (ref 22–32)
Calcium: 8.3 mg/dL — ABNORMAL LOW (ref 8.9–10.3)
Chloride: 93 mmol/L — ABNORMAL LOW (ref 101–111)
Creatinine, Ser: 0.84 mg/dL (ref 0.44–1.00)
GFR calc Af Amer: >60 mL/min (ref >60)
GLUCOSE: 145 mg/dL — AB (ref 65–99)
POTASSIUM: 4.4 mmol/L (ref 3.5–5.1)
Sodium: 133 mmol/L — ABNORMAL LOW (ref 135–145)

## 2014-12-29 LAB — CBC WITH DIFFERENTIAL/PLATELET
BASOS ABS: 0 10*3/uL (ref 0.0–0.1)
BASOS PCT: 0 %
Eosinophils Absolute: 0.1 10*3/uL (ref 0.0–0.7)
Eosinophils Relative: 1 %
HEMATOCRIT: 28.4 % — AB (ref 36.0–46.0)
HEMOGLOBIN: 9.2 g/dL — AB (ref 12.0–15.0)
LYMPHS PCT: 5 %
Lymphs Abs: 0.5 10*3/uL — ABNORMAL LOW (ref 0.7–4.0)
MCH: 28 pg (ref 26.0–34.0)
MCHC: 32.4 g/dL (ref 30.0–36.0)
MCV: 86.6 fL (ref 78.0–100.0)
MONO ABS: 0.7 10*3/uL (ref 0.1–1.0)
Monocytes Relative: 7 %
NEUTROS ABS: 8.2 10*3/uL — AB (ref 1.7–7.7)
NEUTROS PCT: 87 %
Platelets: 173 10*3/uL (ref 150–400)
RBC: 3.28 MIL/uL — AB (ref 3.87–5.11)
RDW: 17.3 % — AB (ref 11.5–15.5)
WBC: 9.5 10*3/uL (ref 4.0–10.5)

## 2014-12-29 LAB — CBC
HEMATOCRIT: 26.9 % — AB (ref 36.0–46.0)
Hemoglobin: 8.5 g/dL — ABNORMAL LOW (ref 12.0–15.0)
MCH: 27.2 pg (ref 26.0–34.0)
MCHC: 31.6 g/dL (ref 30.0–36.0)
MCV: 86.2 fL (ref 78.0–100.0)
PLATELETS: 161 10*3/uL (ref 150–400)
RBC: 3.12 MIL/uL — AB (ref 3.87–5.11)
RDW: 17.6 % — AB (ref 11.5–15.5)
WBC: 8.4 10*3/uL (ref 4.0–10.5)

## 2014-12-29 LAB — GLUCOSE, CAPILLARY
GLUCOSE-CAPILLARY: 135 mg/dL — AB (ref 65–99)
GLUCOSE-CAPILLARY: 139 mg/dL — AB (ref 65–99)
Glucose-Capillary: 114 mg/dL — ABNORMAL HIGH (ref 65–99)

## 2014-12-29 LAB — TROPONIN I
TROPONIN I: 0.03 ng/mL (ref <0.031)
Troponin I: 0.03 ng/mL (ref <0.031)

## 2014-12-29 LAB — MAGNESIUM: Magnesium: 1.9 mg/dL (ref 1.7–2.4)

## 2014-12-29 MED ORDER — SENNOSIDES-DOCUSATE SODIUM 8.6-50 MG PO TABS: 1.0000 | ORAL_TABLET | Freq: Every evening | ORAL | Status: DC | PRN

## 2014-12-29 MED ORDER — ENSURE ENLIVE PO LIQD: 237.0000 mL | Freq: Two times a day (BID) | ORAL | Status: DC

## 2014-12-29 MED ORDER — ACETAMINOPHEN 325 MG PO TABS: 650.0000 mg | ORAL_TABLET | Freq: Four times a day (QID) | ORAL | Status: AC | PRN

## 2014-12-29 MED ORDER — DOCUSATE SODIUM 100 MG PO CAPS: 100.0000 mg | ORAL_CAPSULE | Freq: Two times a day (BID) | ORAL | Status: DC

## 2014-12-29 MED ORDER — ONDANSETRON HCL 4 MG PO TABS: 4.0000 mg | ORAL_TABLET | Freq: Four times a day (QID) | ORAL | Status: DC | PRN

## 2014-12-29 NOTE — Clinical Social Work Placement (Signed)
   CLINICAL SOCIAL WORK PLACEMENT  NOTE  Date:  12/29/2014  Patient Details  Name: Marissa [name] MRN: [id number] Date of Birth: 1934-03-04  Clinical Social Work is seeking post-discharge placement for this patient at the [place] level of care (*CSW will initial, date and re-position this form in  chart as items are completed):  Yes   Patient/family provided with [place] Work Department's list of facilities offering this level of care within the geographic area requested by the patient (or if unable, by the patient's family).  Yes   Patient/family informed of their freedom to choose among providers that offer the needed level of care, that participate in Medicare, Medicaid or managed care program needed by the patient, have an available bed and are willing to accept the patient.  Yes   Patient/family informed of [facility]'s ownership interest in [facility] and [facility], as well as of the fact that they are under no obligation to receive care at these facilities.  PASRR submitted to EDS on 12/28/14 (PASARR submitted by weekend CSW.)     PASRR number received on 12/28/14     Existing PASRR number confirmed on       FL2 transmitted to all facilities in geographic area requested by pt/family on 12/29/14     FL2 transmitted to all facilities within larger geographic area on  (n/a)     Patient informed that his/her managed care company has contracts with or will negotiate with certain facilities, including the following:   (yes, [facility])     Yes   Patient/family informed of bed offers received.  Patient chooses bed at [facility] recommends and patient chooses bed at      Patient to be transferred to [facility] on 12/29/14.  Patient to be transferred to facility by [facility]     Patient family notified on 12/29/14 of transfer.  Name of family member notified:  Peggy Saler      PHYSICIAN       Additional Comment:    _______________________________________________ Caroline Sauger, LCSW 12/29/2014, 2:01 PM

## 2014-12-29 NOTE — Progress Notes (Signed)
PATIENT ID: Marissa [name]  MRN: [id number]  DOB/AGE:  1933/07/11 / 81 y.o.  3 Days Post-Op Procedure(s) (LRB): ORIF LEFT HIP INTERTROCHANTRIC FRACTURE (Left)    PROGRESS NOTE Subjective: Patient is alert, oriented, no Nausea, no Vomiting, yes passing gas, yes Bowel Movement. Taking PO well. Denies SOB, Chest or Calf Pain. Using Incentive Spirometer, PAS in place. Ambulate touch toe weight bearing with therapy. Patient reports pain as mild and moderate  .    Objective: Vital signs in last 24 hours: Filed Vitals:   12/28/14 2245 12/29/14 0509 12/29/14 0823 12/29/14 0851  BP:  112/37 128/58   Pulse:  60 58   Temp:  97.9 F (36.6 C)    TempSrc:  Oral    Resp:  19    Height:      Weight:      SpO2: 98% 99%  99%      Intake/Output from previous day: I/O last 3 completed shifts: In: 800 [P.O.:800] Out: 450 [Urine:450]   Intake/Output this shift:     LABORATORY DATA:  Recent Labs  12/28/14 0910  12/28/14 1544 12/28/14 2137 12/29/14 0010 12/29/14 0548 12/29/14 0633  WBC 7.5  --   --   --  9.5 8.4  --   HGB 9.6*  --   --   --  9.2* 8.5*  --   HCT 30.5*  --   --   --  28.4* 26.9*  --   PLT 172  --   --   --  173 161  --   NA 131*  --   --   --  133*  --   --   K 3.8  --   --   --  4.4  --   --   CL 93*  --   --   --  93*  --   --   CO2 29  --   --   --  31  --   --   BUN 14  --   --   --  17  --   --   CREATININE 0.95  --   --   --  0.84  --   --   GLUCOSE 227*  --   --   --  145*  --   --   GLUCAP  --   < > 221* 174*  --   --  114*  CALCIUM 7.9*  --   --   --  8.3*  --   --   < > = values in this interval not displayed.  Examination: Neurologically intact Neurovascular intact Sensation intact distally Intact pulses distally Dorsiflexion/Plantar flexion intact Incision: dressing C/D/I No cellulitis present Compartment soft} XR AP&Lat of hip shows well placed\fixed THA  Assessment:   3 Days Post-Op Procedure(s) (LRB): ORIF LEFT HIP INTERTROCHANTRIC  FRACTURE (Left) ADDITIONAL DIAGNOSIS:  Expected Acute Blood Loss Anemia, Hypertension, Cardiac Arrythmia A-Fib, CHF, And copd and diabetes  Plan: PT/OT touch toe weight bearing.  DVT Prophylaxis: SCDx72 hrs, ASA 325 mg BID x 2 weeks  DISCHARGE PLAN: Skilled Nursing Facility/Rehab  DISCHARGE NEEDS: HHPT, HHRN, Walker and 3-in-1 comode seat

## 2014-12-29 NOTE — Telephone Encounter (Signed)
Phoned patient's daughter, Jeral Pinch. She reports the plan is to discharge her mother to a rehab facility today. Explained that once her mother is discharged and a facility confirmed this RN will make arrangements for transportation to daily radiation treatments. Daughter confirms her mother definitely wants to continue radiation. This RN understands from Dr. Lisbeth Renshaw that Dr. Julien Nordmann is holding chemotherapy. Will continue to monitor patient's status.

## 2014-12-29 NOTE — Telephone Encounter (Signed)
Patient to be discharged from [facility] to [place]. Spoke with Leafy Half about need for transportation. Fax over copy of radiation treatment schedule attention Leafy Half to [phone]. Confirmation fax of delivery obtained. Informed Miranda, RT on L4 that patient should arrive via [facility] transport service tomorrow at 1140.

## 2014-12-29 NOTE — Progress Notes (Signed)
PT Cancellation Note  Patient Details Name: Marissa [name] MRN: [id number] DOB: 1933-06-25   Cancelled Treatment:    Reason Eval/Treat Not Completed: Other (comment) (Per RN pt is scheduled to be transported at any moment). PT will continue to follow acutely.  Joslyn Hy PT, DPT [phone] Pager: [phone] 12/29/2014, 3:40 PM

## 2014-12-29 NOTE — Care Management Important Message (Signed)
Important Message  Patient Details  Name: Marissa [name] MRN: [id number] Date of Birth: 12-22-1933   Medicare Important Message Given:  Yes-second notification given    Delorse Lek 12/29/2014, 2:15 PM

## 2014-12-29 NOTE — Progress Notes (Signed)
Pt had episodes of ventricular bigeminy. This is a new rhythm from usual a-fib. Pain meds were administered, doctor on call notified. MD ordered EKG and blood work.

## 2014-12-29 NOTE — Discharge Summary (Signed)
Physician Discharge Summary  Marissa [name] MGQ:[id number] DOB: [birth year] DOA: 12/25/2014  PCP: [name], MD  Admit date: 12/25/2014 Discharge date: 12/29/2014  Time spent: greater than 30 minutes  Recommendations for Outpatient Follow-up:  1. Chemotherapy to resume next Monday. 2. Radiation oncology to arrange radiation therapy this week and notify skilled nursing facility 3. Patient going to [facility] skilled nursing facility for rehabilitation.  Discharge Diagnoses:  Principal Problem:   Closed left hip fracture ([place]) Active Problems:   HTN (hypertension)   Hypercholesteremia   CAD (coronary artery disease)   Aortic valve disorder   RLS (restless legs syndrome)   A-fib (HCC)   Mitral valve disease   Hypothyroidism   COPD (chronic obstructive pulmonary disease) (HCC)   CHF (congestive heart failure) (HCC)   Primary cancer of right upper lobe of lung (HCC)   Type 2 diabetes mellitus ([place])  Discharge Condition: stable  Diet recommendation: heart healthy carbohydrate modified  Filed Weights   12/27/14 2200  Weight: 63.05 kg (139 lb)    History of present illness/Hospital Course:  81 year old female with a history of coronary artery disease, hypertension, dyslipidemia, uric stenosis, chronic atrial fibrillation who was admitted to the medicine service on 12/26/2014 when she presented after having a fall at home. She reported being in the usual state health, had a fall while getting out of her bathtub landing on her left hip. She reported significant pain unable to bear weight. Films in the emergency department revealed a closed comminuted left intertrochanteric femoral fracture. Orthopedic surgery was consulted as she was taken to the operating room on 12/26/2014 undergoing open reduction internal fixation of left hip intertrochanteric fracture. Tolerated procedure well there no immediate complications.  1. Left hip fracture. intertrochanteric fracture. -Orthopedic  surgery was consulted as she was taken to the operating room on 12/26/2014 undergoing open reduction and internal fixation. Tolerated procedure well there were no immediate complications. Stable postop. Orthopedics recommend aspirin 325 mg by mouth twice a day for DVT prophylaxis for month. -SNF placement today  2. Hypertension.  - blood pressures are well controlled  -continue Toprol-XL 50 mg by mouth daily and Lasix 20 mg by mouth daily   3. Atrial fibrillation CHADSVasc score of 4 -She previously was anticoagulated, however, was taken off of anticoagulation due to bleeding complications and her own wishes not to restart. She was on aspirin therapy prior to this hospitalization. -Continue metoprolol and digoxin  - she remains rate controlled having ventricular rates in the 70s.   4. Stage IIIa non-small cell lung cancer -Recently presented with recurrence on the right lung Discussed with Dr. Earlie Server. She is to skip chemotherapy this week then resume next Monday. Discussed with Dr. Lisbeth Renshaw who is on-call for Dr. Tammi Klippel. He will reschedule radiation therapy likely sometime this week and then get back on schedule next Monday. They will notify nursing home of particular details.  5. History of coronary artery disease.  -Patient with history of coronary artery disease status post coronary artery bypass grafting with redo bypass in 2013. Stable  6. History of valvular disease -Status post porcine aortic valve replacement in 2013  -Status post mitral valve replacement   7. Hypothyroidism.  - Will check a TSH, meanwhile continue Synthroid 100 g by mouth daily   8. ABLA -expected after surgery, did not require transfusion  Code Status: full code   Consultants:  Orthopedic surgery   Procedures:  Open reduction and internal fixation of left hip fracture performed on 12/26/2014  Discharge  Exam: Filed Vitals:   12/29/14 1139  BP:   Pulse: 72  Temp:   Resp:      General: a and o. comfortable Cardiovascular: RRR Respiratory: cta Ext no CCE  Discharge Instructions   Discharge Instructions    Diet - low sodium heart healthy    Complete by:  As directed      Diet Carb Modified    Complete by:  As directed      Partial weight bearing    Complete by:  As directed      Walk with assistance    Complete by:  As directed           Current Discharge Medication List    START taking these medications   Details  acetaminophen (TYLENOL) 325 MG tablet Take 2 tablets (650 mg total) by mouth every 6 (six) hours as needed for mild pain (or Fever >/= 101).   Associated Diagnoses: Closed left hip fracture, initial encounter (HCC)    docusate sodium (COLACE) 100 MG capsule Take 1 capsule (100 mg total) by mouth 2 (two) times daily. Qty: 10 capsule, Refills: 0   Associated Diagnoses: Closed left hip fracture, initial encounter ([place])    feeding supplement, ENSURE ENLIVE, (ENSURE ENLIVE) LIQD Take 237 mLs by mouth 2 (two) times daily between meals. Qty: 237 mL, Refills: 12    HYDROcodone-acetaminophen (NORCO) 5-325 MG tablet Take 1 tablet by mouth every 6 (six) hours as needed. Qty: 60 tablet, Refills: 0    ondansetron (ZOFRAN) 4 MG tablet Take 1 tablet (4 mg total) by mouth every 6 (six) hours as needed for nausea. Qty: 20 tablet, Refills: 0   Associated Diagnoses: Closed left hip fracture, initial encounter ([place])    senna-docusate (SENOKOT-S) 8.6-50 MG tablet Take 1 tablet by mouth at bedtime as needed for mild constipation.   Associated Diagnoses: Closed left hip fracture, initial encounter ([place])      CONTINUE these medications which have CHANGED   Details  aspirin EC 325 MG tablet Take 1 tablet (325 mg total) by mouth 2 (two) times daily. Qty: 30 tablet, Refills: 0      CONTINUE these medications which have NOT CHANGED   Details  alendronate (FOSAMAX) 70 MG tablet Take 70 mg by mouth once a week.     atorvastatin (LIPITOR) 40 MG  tablet Take 40 mg by mouth daily.     CALCIUM CARBONATE PO Take 1 tablet by mouth 2 (two) times daily.    digoxin (LANOXIN) 0.125 MG tablet Take 0.125 mg by mouth daily.     esomeprazole (NEXIUM) 40 MG capsule Take 40 mg by mouth daily as needed (for acid reflux).     fluticasone-salmeterol (ADVAIR HFA) 115-21 MCG/ACT inhaler Inhale 2 puffs into the lungs 2 (two) times daily.     furosemide (LASIX) 40 MG tablet Take 20-40 mg by mouth 2 (two) times daily. Take 40 mg by mouth in the morning and take 20 mg by mouth in the evening for fluid.    glimepiride (AMARYL) 1 MG tablet Take 1 mg by mouth daily with breakfast.    Ipratropium-Albuterol (COMBIVENT) 20-100 MCG/ACT AERS respimat Inhale 1 puff into the lungs every 6 (six) hours as needed for wheezing or shortness of breath.     ipratropium-albuterol (DUONEB) 0.5-2.5 (3) MG/3ML SOLN Take 3 mLs by nebulization every 6 (six) hours as needed (for shortness of breath).     levothyroxine (SYNTHROID, LEVOTHROID) 100 MCG tablet Take 100 mcg by mouth daily  before breakfast.     metoprolol succinate (TOPROL-XL) 50 MG 24 hr tablet Take 1 tablet (50 mg total) by mouth every evening. Qty: 90 tablet, Refills: 3    potassium chloride SA (K-DUR,KLOR-CON) 20 MEQ tablet Take 20 mEq by mouth daily.     prochlorperazine (COMPAZINE) 10 MG tablet Take 1 tablet (10 mg total) by mouth every 6 (six) hours as needed for nausea or vomiting. Qty: 30 tablet, Refills: 0    rOPINIRole (REQUIP) 4 MG tablet Take 4 mg by mouth at bedtime.    vitamin C (ASCORBIC ACID) 500 MG tablet Take 500 mg by mouth daily.    Wound Cleansers (RADIAPLEX EX) Apply 1 application topically daily as needed (rain).        Allergies  Allergen Reactions  . Coumadin [Warfarin Sodium] Other (See Comments)    Reaction: Bleeding from ears  . Prozac [Fluoxetine Hcl] Rash   Follow-up Information    Follow up with Kerin Salen, MD In 2 weeks.   Specialty:  Orthopedic Surgery   Contact  information:   [place] [id number] [phone]        The results of significant diagnostics from this hospitalization (including imaging, microbiology, ancillary and laboratory) are listed below for reference.    Significant Diagnostic Studies: Dg Chest 1 View  12/25/2014   CLINICAL DATA:  LEFT hip fracture.  EXAM: CHEST 1 VIEW  COMPARISON:  Chest radiograph November 21, 2014 and CT chest November 20, 2014.  FINDINGS: Masslike consolidation RIGHT upper lobe, decreased from prior study. Similar pulmonary vascular congestion and fullness of the RIGHT hilum corresponding to lymphadenopathy on recent CT chest. The cardiac silhouette is mildly enlarged. Status post median sternotomy for cardiac valve replacement and CABG. Similar LEFT lung base atelectasis/ scarring. No pleural effusion. No pneumothorax. Partially imaged screw in RIGHT humeral head.  IMPRESSION: Improving RIGHT upper lobe consolidation/mass, recommend continued surveillance.  Mild cardiomegaly, pulmonary vascular congestion.   Electronically Signed   By: Elon Alas M.D.   On: 12/25/2014 22:42   Pelvis Portable  12/26/2014   CLINICAL DATA:  ORIF left hip.  EXAM: PORTABLE PELVIS 1-2 VIEWS  COMPARISON:  PET-CT 11/10/2014.  FINDINGS: ORIF left hip with good anatomic alignment on AP view. Hardware intact. Surgical clips in the pelvis.  IMPRESSION: ORIF left hip with good anatomic alignment on AP view.   Electronically Signed   By: Marcello Moores  Register   On: 12/26/2014 12:44   Dg Hip Operative Unilat With Pelvis Left  12/26/2014   CLINICAL DATA:  Intraoperative fluoro spot images from ORIF of the left hip  EXAM: OPERATIVE left HIP (WITH PELVIS IF PERFORMED) 2 VIEWS  TECHNIQUE: Fluoroscopic spot image(s) were submitted for interpretation post-operatively. Fluoro time reported is 1 minutes, 4 seconds  COMPARISON:  Preoperative left hip series of December 25, 2014  FINDINGS: The patient has undergone telescoping nail and  sideplate fixation of an intertrochanteric fracture. Alignment of the fracture fragments is more near anatomic. There is avulsion of the lesser trochanter.  IMPRESSION: The patient has undergone ORIF for an intertrochanteric fracture of the left hip. There is no immediate postprocedure complication.   Electronically Signed   By: David  Martinique M.D.   On: 12/26/2014 11:43   Dg Hip Unilat With Pelvis 2-3 Views Left  12/25/2014   CLINICAL DATA:  Fall  EXAM: DG HIP (WITH OR WITHOUT PELVIS) 2-3V LEFT  COMPARISON:  None.  FINDINGS: Comminuted intertrochanteric left femur fracture. Lesser trochanter is displaced. Greater  trochanter is also displaced. Slight varus deformity. Postop changes in the pelvis. Advanced lumbar spine degenerative change.  IMPRESSION: Comminuted left intertrochanteric femur fracture.   Electronically Signed   By: Marybelle Killings M.D.   On: 12/25/2014 22:39    Microbiology: Recent Results (from the past 240 hour(s))  MRSA PCR Screening     Status: None   Collection Time: 12/26/14  8:39 AM  Result Value Ref Range Status   MRSA by PCR NEGATIVE NEGATIVE Final    Comment:        The GeneXpert MRSA Assay (FDA approved for NASAL specimens only), is one component of a comprehensive MRSA colonization surveillance program. It is not intended to diagnose MRSA infection nor to guide or monitor treatment for MRSA infections.      Labs: Basic Metabolic Panel:  Recent Labs Lab 12/25/14 2238 12/26/14 0153 12/26/14 1450 12/27/14 0421 12/28/14 0910 12/29/14 0010  NA 138  --  137 135 131* 133*  K 3.8  --  3.9 3.9 3.8 4.4  CL 96*  --  97* 95* 93* 93*  CO2 32  --  '27 29 29 31  '$ GLUCOSE 178*  --  173* 153* 227* 145*  BUN 32*  --  '19 14 14 17  '$ CREATININE 1.13*  --  0.78 0.71 0.95 0.84  CALCIUM 8.8*  --  8.2* 8.0* 7.9* 8.3*  MG  --  1.9  --   --   --  1.9  PHOS  --  3.1  --   --   --   --    Liver Function Tests:  Recent Labs Lab 12/26/14 1450  AST 159*  ALT 170*  ALKPHOS  108  BILITOT 1.0  PROT 5.4*  ALBUMIN 2.7*   No results for input(s): LIPASE, AMYLASE in the last 168 hours. No results for input(s): AMMONIA in the last 168 hours. CBC:  Recent Labs Lab 12/25/14 2238 12/26/14 1450 12/27/14 0421 12/28/14 0910 12/29/14 0010 12/29/14 0548  WBC 10.9* 9.3 8.4 7.5 9.5 8.4  NEUTROABS 9.3*  --   --   --  8.2*  --   HGB 13.3 11.1* 9.9* 9.6* 9.2* 8.5*  HCT 41.1 34.9* 30.5* 30.5* 28.4* 26.9*  MCV 86.2 86.4 86.6 86.4 86.6 86.2  PLT 227 176 146* 172 173 161   Cardiac Enzymes:  Recent Labs Lab 12/29/14 0010 12/29/14 0548  TROPONINI 0.03 0.03   BNP: BNP (last 3 results) No results for input(s): BNP in the last 8760 hours.  ProBNP (last 3 results) No results for input(s): PROBNP in the last 8760 hours.  CBG:  Recent Labs Lab 12/28/14 0628 12/28/14 1202 12/28/14 1544 12/28/14 2137 12/29/14 0633  GLUCAP 135* 172* 221* 174* 114*       Signed:  Keyetta Hollingworth L  [facility] 12/29/2014, 12:14 PM

## 2014-12-29 NOTE — Discharge Planning (Signed)
Patient to be discharged to [place]. Patient and patient's daughter updated regarding discharge.  Facility: [facility] RN report number: [phone] Transportation: EMS  Lubertha Sayres, [place] [phone]) and Surgical [phone])

## 2014-12-30 ENCOUNTER — Telehealth: Payer: Self-pay | Admitting: *Deleted

## 2014-12-30 ENCOUNTER — Ambulatory Visit
Admission: RE | Admit: 2014-12-30 | Discharge: 2014-12-30 | Disposition: A | Discharge status: Home / Self Care | Payer: Medicare Other | Source: Ambulatory Visit | Attending: Radiation Oncology | Admitting: Radiation Oncology

## 2014-12-30 ENCOUNTER — Encounter: Payer: Self-pay | Admitting: Radiation Oncology

## 2014-12-30 VITALS — BP 141/58 | HR 81 | Temp 97.7°F | Resp 16

## 2014-12-30 DIAGNOSIS — R2681 Unsteadiness on feet: Secondary | ICD-10-CM | POA: Diagnosis not present

## 2014-12-30 DIAGNOSIS — R0602 Shortness of breath: Secondary | ICD-10-CM | POA: Diagnosis not present

## 2014-12-30 DIAGNOSIS — C3411 Malignant neoplasm of upper lobe, right bronchus or lung: Secondary | ICD-10-CM | POA: Diagnosis not present

## 2014-12-30 NOTE — Progress Notes (Signed)
CC: Dr. Tyler Pita   Clinic note: I was called by a therapist on the [place].  While the patient was getting on the [facility] table she slid off the age and braced herself with her left leg which had a hip replacement this past Friday after she fell at home.  There was no trauma to the left hip or fall.  She was then placed supine and she received her radiation therapy treatment.  I saw her on the [facility] treatment table and then I saw her in nursing after her treatment.  On inspection of the left hip dressings are intact and there is no drainage or bleeding.  I did the flex her left hip slightly and there is no evidence for dislocation.  Neurovascular examination is intact.  Impression: No real trauma to the left lower extremity, but she did braced herself to keep from sliding off the table.  I do not feel that she needs radiographs.  She'll be placed in her wheelchair and then transported back to her skilled nursing home.  She is scheduled for continuation of her radiation therapy tomorrow.

## 2014-12-30 NOTE — Telephone Encounter (Signed)
Call from pt daughter regarding 10/10 chemotherapy appt. Pt fell in shower broke hip and miss appt 10/10 due to moving into rehab. Reviewed with MD, no need to r/s chemo. Pt to continue treatment next week 10/17. Daughter verbalized understanding, no further concerns.

## 2014-12-30 NOTE — Progress Notes (Signed)
Received patient in nursing after she slipped on the edge of the treatment table. Vitals stable. Patient denies pain. Assisted patient onto bedpan. Patient voided approximately 100 cc of clear yellow urine. Reports a productive cough with clear to yellow sputum. Reports scant hemoptysis. Denies SOB at rest. Denies difficulty swallowing or a sore throat. Strong pedal pulses. Left leg flexion without restriction. Left hip bandage without drainage. Patient evaluated by Dr. Valere Dross.   BP 141/58 mmHg  Pulse 81  Temp(Src) 97.7 F (36.5 C) (Oral)  Resp 16  SpO2 100% Wt Readings from Last 3 Encounters:  12/27/14 139 lb (63.05 kg)  12/25/14 139 lb 12.8 oz (63.413 kg)  12/22/14 140 lb 6.4 oz (63.685 kg)

## 2014-12-30 NOTE — Progress Notes (Signed)
See dictated note from earlier today. 

## 2014-12-31 ENCOUNTER — Ambulatory Visit (HOSPITAL_COMMUNITY)
Admission: RE | Admit: 2014-12-31 | Discharge: 2014-12-31 | Disposition: A | Discharge status: Home / Self Care | Payer: Medicare Other | Source: Ambulatory Visit | Attending: Internal Medicine | Admitting: Internal Medicine

## 2014-12-31 ENCOUNTER — Ambulatory Visit
Admission: RE | Admit: 2014-12-31 | Discharge: 2014-12-31 | Disposition: A | Discharge status: Home / Self Care | Payer: Medicare Other | Source: Ambulatory Visit | Attending: Radiation Oncology | Admitting: Radiation Oncology

## 2014-12-31 DIAGNOSIS — R0602 Shortness of breath: Secondary | ICD-10-CM | POA: Diagnosis not present

## 2014-12-31 DIAGNOSIS — C3411 Malignant neoplasm of upper lobe, right bronchus or lung: Secondary | ICD-10-CM | POA: Diagnosis not present

## 2014-12-31 DIAGNOSIS — G9389 Other specified disorders of brain: Secondary | ICD-10-CM | POA: Insufficient documentation

## 2014-12-31 DIAGNOSIS — G319 Degenerative disease of nervous system, unspecified: Secondary | ICD-10-CM | POA: Insufficient documentation

## 2014-12-31 DIAGNOSIS — C3490 Malignant neoplasm of unspecified part of unspecified bronchus or lung: Secondary | ICD-10-CM | POA: Diagnosis not present

## 2014-12-31 DIAGNOSIS — R2681 Unsteadiness on feet: Secondary | ICD-10-CM | POA: Diagnosis not present

## 2014-12-31 MED ORDER — GADOBENATE DIMEGLUMINE 529 MG/ML IV SOLN
15.0000 mL | Freq: Once | INTRAVENOUS | Status: AC | PRN
  Administered 2014-12-31: 13 mL via INTRAVENOUS

## 2015-01-01 ENCOUNTER — Ambulatory Visit
Admission: RE | Admit: 2015-01-01 | Discharge: 2015-01-01 | Disposition: A | Discharge status: Home / Self Care | Payer: Medicare Other | Source: Ambulatory Visit | Attending: Radiation Oncology | Admitting: Radiation Oncology

## 2015-01-01 ENCOUNTER — Non-Acute Institutional Stay (SKILLED_NURSING_FACILITY): Payer: Medicare Other | Admitting: Internal Medicine

## 2015-01-01 ENCOUNTER — Encounter: Payer: Self-pay | Admitting: Internal Medicine

## 2015-01-01 VITALS — BP 126/50 | HR 78 | Temp 97.5°F

## 2015-01-01 DIAGNOSIS — E034 Atrophy of thyroid (acquired): Secondary | ICD-10-CM | POA: Diagnosis not present

## 2015-01-01 DIAGNOSIS — C3411 Malignant neoplasm of upper lobe, right bronchus or lung: Secondary | ICD-10-CM

## 2015-01-01 DIAGNOSIS — R2681 Unsteadiness on feet: Secondary | ICD-10-CM | POA: Diagnosis not present

## 2015-01-01 DIAGNOSIS — E038 Other specified hypothyroidism: Secondary | ICD-10-CM | POA: Diagnosis not present

## 2015-01-01 DIAGNOSIS — D62 Acute posthemorrhagic anemia: Secondary | ICD-10-CM

## 2015-01-01 DIAGNOSIS — S72002D Fracture of unspecified part of neck of left femur, subsequent encounter for closed fracture with routine healing: Secondary | ICD-10-CM | POA: Diagnosis not present

## 2015-01-01 DIAGNOSIS — I25810 Atherosclerosis of coronary artery bypass graft(s) without angina pectoris: Secondary | ICD-10-CM

## 2015-01-01 DIAGNOSIS — R0602 Shortness of breath: Secondary | ICD-10-CM | POA: Diagnosis not present

## 2015-01-01 DIAGNOSIS — I110 Hypertensive heart disease with heart failure: Secondary | ICD-10-CM

## 2015-01-01 DIAGNOSIS — I482 Chronic atrial fibrillation, unspecified: Secondary | ICD-10-CM

## 2015-01-01 NOTE — Progress Notes (Signed)
  Radiation Oncology         [phone]   Name: Marissa [name] MRN: [id number]   Date: 01/01/2015  DOB: 1934-03-21   Weekly Radiation Therapy Management    ICD-9-CM ICD-10-CM   1. Primary cancer of right upper lobe of lung (HCC) 162.3 C34.11    Primary cancer of right upper lobe of lung  Current Dose: 34 Gy Planned Dose:  66 Gy  Narrative The patient presents for routine under treatment assessment.    Marissa [name] is here for 17th fraction of radiation to her right lung. She is living at [facility] because of a fractured left hip after a fall at home 12/25/14. She is on 2 Liters of oxygen today, which she has had for about a year per family. Her family reports she is coughing up slightly more blood than before, she reports her tissues will have streaks of blood on them at times. Her sputum is thick, mostly clear, and sometimes has a yellow tinge to it. She is participating in therapy at [facility]. She reports she is eating ok, when she likes the food she is receiving at [facility]. Reports slight tightness of the throat while swallowing. Chewing food thoroughly and following food with drink manages it. Currently taking pain medication as needed for hip pain. Reports congestion from her emphysema, asked what she could take to decrease the congestion.    Physical Findings  temperature is 97.5 F (36.4 C). Her blood pressure is 126/50 and her pulse is 78. Her oxygen saturation is 99%.  Weight essentially stable. There is no significant changes to the status of overall health to be noted at this time.  Impression Marissa [name] is a 81 year old woman presenting to clinic in regards to her primary cancer of right upper lobe of lung. The patient is tolerating radiation.   She is managing reported symptoms appropriately.    Plan The patient has been advised to continue treatment as planned. Healthy methods of management in regards to reported symptoms were reviewed in detail. She  is advised of her follow-up appointment to take place with radiation oncology next week, as scheduled.  I have advised for Marissa [name] to try Mucinex for her congestion, being sure to drink plenty of fluids while taking this medication.  All vocalized questions and concerns have been addressed. If the patient develops any further questions or concerns in regards to her treatment and recovery, she has been encouraged to contact Dr. Tammi Klippel, MD.     This document serves as a record of services personally performed by Tyler Pita, MD. It was created on his behalf by Arlyce Harman, a trained medical scribe. The creation of this record is based on the scribe's personal observations and the provider's statements to them. This document has been checked and approved by the attending provider.     ______________________________________________________  Sheral Apley. Tammi Klippel, M.D.

## 2015-01-01 NOTE — Progress Notes (Signed)
Marissa [name] is here for 17th fraction of radiation to her right lung.  She is living at [place] because of a fractured left hip after a fall at home 12/25/14. She is on 2 Liters of oxygen today, which she has had for about a year per family. Her family reports she is coughing up slightly more blood than before, she reports her tissues will have streaks of blood on them at times. Her sputum is thick, mostly clear, and sometimes has a yellow tinge to it. She is participating in therapy at [facility]. She reports she is eating ok, when she likes the food she is receiving at [facility].  BP 126/50 mmHg  Pulse 78  Temp(Src) 97.5 F (36.4 C)  SpO2 99%

## 2015-01-01 NOTE — Progress Notes (Signed)
MRN: [id number] Name: NEKESHA FONT  Sex: female Age: 81 y.o. DOB: June 24, 1933  [place] #: Helene Kelp Facility/Room:212 Level Of Care: SNF Provider: Inocencio Homes D Emergency Contacts: Extended Emergency Contact Information Primary Emergency Contact: [place] Phone: [phone] Relation: Daughter  Code Status:   Allergies: Coumadin and Prozac  Chief Complaint  Patient presents with  . New Admit To SNF    HPI: Patient is 81 y.o. female whocoronary artery disease, hypertension, dyslipidemia, uric stenosis, chronic atrial fibrillation who was admitted to the medicine service on 10/6-10 when she presented after having a fall at home.Pt underwent an ORIF without complications. Pt is admitted to SNF for OT/PT. While at SNF pt will be followed for for HTN, tx with toprol and lasix, a fib, tx with toprol and ASA and hypothyroidism, tx with synthroid.  Past Medical History  Diagnosis Date  . HTN (hypertension)   . Hypercholesteremia   . CAD (coronary artery disease)     s/p CABG 03/2011  . Aortic valve disorders   . RLS (restless legs syndrome)   . A-fib ([place])   . Osteopenia   . Mitral valve disease     s/p MVR 03/2011 w/ 27 mm porcine valve  . Hypothyroidism   . COPD (chronic obstructive pulmonary disease) ([place])   . CHF (congestive heart failure) ([place])   . Aortic stenosis     (s/p AVR w/ 21 mm porcine valve 03/2011  . Mastoiditis   . Myocardial infarction ([place])     "slight"  . Dysrhythmia   . Shortness of breath     with exertion  . Pre-diabetes   . GERD (gastroesophageal reflux disease)     takes nexium prn  . Arthritis   . History of blood transfusion     Rheumatic Fever  . Lung cancer ([place])   . Pneumonia   . Diabetes mellitus without complication ([place])   . Full code status 12/22/2014  . Full code status 12/22/2014    Past Surgical History  Procedure Laterality Date  . Mitral valve replacement      1990's  . Laparoscopic  cholecystectomy    . Aortic valve replacement    . Tonsillectomy    . Coronary artery bypass graft  03/2011    1990's also  . Video bronchoscopy with endobronchial navigation N/A 01/06/2014    Procedure: VIDEO BRONCHOSCOPY WITH ENDOBRONCHIAL NAVIGATION;  Surgeon: [name], MD;  Location: [facility];  Service: Thoracic;  Laterality: N/A;  . Video bronchoscopy with endobronchial ultrasound N/A 01/06/2014    Procedure: VIDEO BRONCHOSCOPY WITH ENDOBRONCHIAL ULTRASOUND;  Surgeon: [name], MD;  Location: [facility];  Service: Thoracic;  Laterality: N/A;  . Video bronchoscopy with endobronchial navigation N/A 11/21/2014    Procedure: VIDEO BRONCHOSCOPY WITH ENDOBRONCHIAL NAVIGATION;  Surgeon: [name], MD;  Location: [facility];  Service: Thoracic;  Laterality: N/A;  . Video bronchoscopy with endobronchial ultrasound N/A 11/21/2014    Procedure: VIDEO BRONCHOSCOPY WITH ENDOBRONCHIAL ULTRASOUND;  Surgeon: [name], MD;  Location: [facility];  Service: Thoracic;  Laterality: N/A;  . Compression hip screw Left 12/26/2014    Procedure: ORIF LEFT HIP INTERTROCHANTRIC FRACTURE;  Surgeon: Frederik Pear, MD;  Location: [facility];  Service: Orthopedics;  Laterality: Left;      Medication List       This list is accurate as of: 01/01/15 11:59 PM.  Always use your most recent med list.  acetaminophen 325 MG tablet  Commonly known as:  TYLENOL  Take 2 tablets (650 mg total) by mouth every 6 (six) hours as needed for mild pain (or Fever >/= 101).     alendronate 70 MG tablet  Commonly known as:  FOSAMAX  Take 70 mg by mouth once a week.     aspirin EC 325 MG tablet  Take 1 tablet (325 mg total) by mouth 2 (two) times daily.     atorvastatin 40 MG tablet  Commonly known as:  LIPITOR  Take 40 mg by mouth daily.     CALCIUM CARBONATE PO  Take 1 tablet by mouth 2 (two) times daily.     digoxin 0.125 MG tablet  Commonly known as:  LANOXIN  Take 0.125 mg by mouth  daily.     docusate sodium 100 MG capsule  Commonly known as:  COLACE  Take 1 capsule (100 mg total) by mouth 2 (two) times daily.     esomeprazole 40 MG capsule  Commonly known as:  NEXIUM  Take 40 mg by mouth daily as needed (for acid reflux).     feeding supplement (ENSURE ENLIVE) Liqd  Take 237 mLs by mouth 2 (two) times daily between meals.     fluticasone-salmeterol 115-21 MCG/ACT inhaler  Commonly known as:  ADVAIR HFA  Inhale 2 puffs into the lungs 2 (two) times daily.     furosemide 40 MG tablet  Commonly known as:  LASIX  Take 20-40 mg by mouth 2 (two) times daily. Take 40 mg by mouth in the morning and take 20 mg by mouth in the evening for fluid.     glimepiride 1 MG tablet  Commonly known as:  AMARYL  Take 1 mg by mouth daily with breakfast.     HYDROcodone-acetaminophen 5-325 MG tablet  Commonly known as:  NORCO  Take 1 tablet by mouth every 6 (six) hours as needed.     Ipratropium-Albuterol 20-100 MCG/ACT Aers respimat  Commonly known as:  COMBIVENT  Inhale 1 puff into the lungs every 6 (six) hours as needed for wheezing or shortness of breath.     ipratropium-albuterol 0.5-2.5 (3) MG/3ML Soln  Commonly known as:  DUONEB  Take 3 mLs by nebulization every 6 (six) hours as needed (for shortness of breath).     levothyroxine 100 MCG tablet  Commonly known as:  SYNTHROID, LEVOTHROID  Take 100 mcg by mouth daily before breakfast.     metoprolol succinate 50 MG 24 hr tablet  Commonly known as:  TOPROL-XL  Take 1 tablet (50 mg total) by mouth every evening.     ondansetron 4 MG tablet  Commonly known as:  ZOFRAN  Take 1 tablet (4 mg total) by mouth every 6 (six) hours as needed for nausea.     potassium chloride SA 20 MEQ tablet  Commonly known as:  K-DUR,KLOR-CON  Take 20 mEq by mouth daily.     prochlorperazine 10 MG tablet  Commonly known as:  COMPAZINE  Take 1 tablet (10 mg total) by mouth every 6 (six) hours as needed for nausea or vomiting.      RADIAPLEX EX  Apply 1 application topically daily as needed (rain).     rOPINIRole 4 MG tablet  Commonly known as:  REQUIP  Take 4 mg by mouth at bedtime.     senna-docusate 8.6-50 MG tablet  Commonly known as:  Senokot-S  Take 1 tablet by mouth at bedtime as needed for mild constipation.  vitamin C 500 MG tablet  Commonly known as:  ASCORBIC ACID  Take 500 mg by mouth daily.        No orders of the defined types were placed in this encounter.     There is no immunization history on file for this patient.  Social History  Substance Use Topics  . Smoking status: Former Smoker -- 0.50 packs/day for 30 years    Types: Cigarettes  . Smokeless tobacco: Never Used  . Alcohol Use: No    Family history is +HTN,HLD,DM2  Review of Systems  DATA OBTAINED: from patient, nurse, medical record, daughter- pt and daughter had multople c/o about the SNF but no med c/o GENERAL:  no fevers, fatigue, appetite changes SKIN: No itching, rash or wounds EYES: No eye pain, redness, discharge EARS: No earache, tinnitus, change in hearing NOSE: No congestion, drainage or bleeding  MOUTH/THROAT: No mouth or tooth pain, No sore throat RESPIRATORY: No cough, wheezing, SOB CARDIAC: No chest pain, palpitations, lower extremity edema  GI: No abdominal pain, No N/V/D or constipation, No heartburn or reflux  GU: No dysuria, frequency or urgency, or incontinence  MUSCULOSKELETAL: No unrelieved bone/joint pain NEUROLOGIC: No headache, dizziness or focal weakness PSYCHIATRIC: No c/o anxiety or sadness   Filed Vitals:   01/01/15 2100  BP: 150/62  Pulse: 52  Temp: 97.6 F (36.4 C)  Resp: 18    SpO2 Readings from Last 1 Encounters:  01/01/15 99%        Physical Exam  GENERAL APPEARANCE: Alert, conversant,  No acute distress.  SKIN: No diaphoresis rash HEAD: Normocephalic, atraumatic  EYES: Conjunctiva/lids clear. Pupils round, reactive. EOMs intact.  EARS: External exam WNL, canals  clear. Hearing grossly normal.  NOSE: No deformity or discharge.  MOUTH/THROAT: Lips w/o lesions  RESPIRATORY: Breathing is even, unlabored. Lung sounds are clear   CARDIOVASCULAR: Heart irreg 2/6  Murmur, no  rubs or gallops. Slt LLE  peripheral edema.   GASTROINTESTINAL: Abdomen is soft, non-tender, not distended w/ normal bowel sounds. GENITOURINARY: Bladder non tender, not distended  MUSCULOSKELETAL: No abnormal joints or musculature NEUROLOGIC:  Cranial nerves 2-12 grossly intact. Moves all extremities  PSYCHIATRIC: Mood and affect appropriate to situation, no behavioral issues  Patient Active Problem List   Diagnosis Date Noted  . Postoperative anemia due to acute blood loss 01/04/2015  . Type 2 diabetes mellitus ([place]) 12/26/2014  . Closed left hip fracture ([place]) 12/25/2014  . Full code status 12/22/2014  . Full code status 12/22/2014  . Primary cancer of right upper lobe of lung ([place]) 12/19/2013  . Hypertensive heart disease with CHF (congestive heart failure) ([place])   . Hypercholesteremia   . CAD (coronary artery disease)   . Aortic valve disorder   . RLS (restless legs syndrome)   . A-fib ([place])   . Osteopenia   . Mitral valve disease   . Hypothyroidism   . COPD (chronic obstructive pulmonary disease) ([place])   . CHF (congestive heart failure) ([place])   . Aortic stenosis   . Mastoiditis     CBC    Component Value Date/Time   WBC 8.4 12/29/2014 0548   WBC 9.2 12/22/2014 0844   WBC 15.3* 04/17/2014 0409   RBC 3.12* 12/29/2014 0548   RBC 4.31 12/22/2014 0844   RBC 4.72 04/17/2014 0409   HGB 8.5* 12/29/2014 0548   HGB 12.1 12/22/2014 0844   HGB 13.5 04/17/2014 0409   HCT 26.9* 12/29/2014 0548   HCT 37.1 12/22/2014 0844  HCT 40.6 04/17/2014 0409   PLT 161 12/29/2014 0548   PLT 207 12/22/2014 0844   PLT 168 04/17/2014 0409   MCV 86.2 12/29/2014 0548   MCV 86.1 12/22/2014 0844   MCV 86 04/17/2014 0409   LYMPHSABS 0.5* 12/29/2014 0010   LYMPHSABS 0.9 12/22/2014  0844   LYMPHSABS 1.1 04/17/2014 0409   MONOABS 0.7 12/29/2014 0010   MONOABS 0.7 12/22/2014 0844   MONOABS 0.4 04/17/2014 0409   EOSABS 0.1 12/29/2014 0010   EOSABS 0.2 12/22/2014 0844   EOSABS 0.0 04/17/2014 0409   BASOSABS 0.0 12/29/2014 0010   BASOSABS 0.0 12/22/2014 0844   BASOSABS 0.0 04/17/2014 0409    CMP     Component Value Date/Time   NA 133* 12/29/2014 0010   NA 139 12/22/2014 0845   NA 137 04/17/2014 0409   K 4.4 12/29/2014 0010   K 3.5 12/22/2014 0845   K 2.8* 04/18/2014 0422   CL 93* 12/29/2014 0010   CL 98 04/17/2014 0409   CO2 31 12/29/2014 0010   CO2 26 12/22/2014 0845   CO2 30 04/17/2014 0409   GLUCOSE 145* 12/29/2014 0010   GLUCOSE 202* 12/22/2014 0845   GLUCOSE 244* 04/17/2014 0409   BUN 17 12/29/2014 0010   BUN 21.1 12/22/2014 0845   BUN 19* 04/17/2014 0409   CREATININE 0.84 12/29/2014 0010   CREATININE 0.8 12/22/2014 0845   CREATININE 1.08 04/17/2014 0409   CALCIUM 8.3* 12/29/2014 0010   CALCIUM 9.6 12/22/2014 0845   CALCIUM 9.0 04/17/2014 0409   PROT 5.4* 12/26/2014 1450   PROT 6.4 12/22/2014 0845   ALBUMIN 2.7* 12/26/2014 1450   ALBUMIN 3.1* 12/22/2014 0845   AST 159* 12/26/2014 1450   AST 32 12/22/2014 0845   ALT 170* 12/26/2014 1450   ALT 30 12/22/2014 0845   ALKPHOS 108 12/26/2014 1450   ALKPHOS 78 12/22/2014 0845   BILITOT 1.0 12/26/2014 1450   BILITOT 0.58 12/22/2014 0845   GFRNONAA >60 12/29/2014 0010   GFRNONAA 52* 04/17/2014 0409   GFRNONAA 37* 11/06/2013 1527   GFRAA >60 12/29/2014 0010   GFRAA >60 04/17/2014 0409   GFRAA 43* 11/06/2013 1527    Lab Results  Component Value Date   HGBA1C 7.5* 12/26/2014     No results found.  Not all labs, radiology exams or other studies done during hospitalization come through on my EPIC note; however they are reviewed by me.    Assessment and Plan  Closed left hip fracture (HCC) intertrochanteric fracture. -Orthopedic surgery was consulted as she was taken to the operating  room on 12/26/2014 undergoing open reduction and internal fixation. Tolerated procedure well there were no immediate complications. Stable postop. Orthopedics recommend aspirin 325 mg by mouth twice a day for DVT prophylaxis for month SNF - Ot/PT, ASA 325 for one month  Hypertensive heart disease with CHF (congestive heart failure) (HCC) blood pressures are well controlled  SNF - continue Toprol-XL 50 mg by mouth daily and Lasix 20 mg by mouth daily   A-fib ([place]) She previously was anticoagulated, however, was taken off of anticoagulation due to bleeding complications and her own wishes not to restart. She was on aspirin therapy prior to this hospitalization. SNF - Continue metoprolol and digoxin and ASA    Primary cancer of right upper lobe of lung ([place]) Recently presented with recurrence on the right lung Discussed with Dr. Earlie Server. She is to skip chemotherapy this week then resume next Monday. Discussed with Dr. Lisbeth Renshaw who is on-call for  Dr. Tammi Klippel. He will reschedule radiation therapy likely sometime this week and then get back on schedule next Monday  CAD (coronary artery disease) Patient with history of coronary artery disease status post coronary artery bypass grafting with redo bypass in 2013. Stable;SNF - cont Bblocker, ASA, statin   Hypothyroidism SNF- TSH 1.3; continue Synthroid 100 g by mouth daily    Postoperative anemia due to acute blood loss expected after surgery, did not require transfusion; SNF- will follow ; d/c Hb 8.5    Time spent 45 min;> 50% of time with patient was spent reviewing records, labs, tests and studies, counseling and developing plan of care  Hennie Duos, MD

## 2015-01-02 ENCOUNTER — Ambulatory Visit
Admission: RE | Admit: 2015-01-02 | Discharge: 2015-01-02 | Disposition: A | Discharge status: Home / Self Care | Payer: Medicare Other | Source: Ambulatory Visit | Attending: Radiation Oncology | Admitting: Radiation Oncology

## 2015-01-02 DIAGNOSIS — C3411 Malignant neoplasm of upper lobe, right bronchus or lung: Secondary | ICD-10-CM | POA: Diagnosis not present

## 2015-01-02 DIAGNOSIS — Z510 Encounter for antineoplastic radiation therapy: Secondary | ICD-10-CM | POA: Insufficient documentation

## 2015-01-02 DIAGNOSIS — Z79899 Other long term (current) drug therapy: Secondary | ICD-10-CM | POA: Insufficient documentation

## 2015-01-04 ENCOUNTER — Encounter: Payer: Self-pay | Admitting: Internal Medicine

## 2015-01-04 DIAGNOSIS — D62 Acute posthemorrhagic anemia: Secondary | ICD-10-CM | POA: Insufficient documentation

## 2015-01-04 NOTE — Assessment & Plan Note (Signed)
expected after surgery, did not require transfusion; SNF- will follow ; d/c Hb 8.5

## 2015-01-04 NOTE — Assessment & Plan Note (Addendum)
She previously was anticoagulated, however, was taken off of anticoagulation due to bleeding complications and her own wishes not to restart. She was on aspirin therapy prior to this hospitalization. SNF - Continue metoprolol and digoxin and ASA

## 2015-01-04 NOTE — Assessment & Plan Note (Signed)
Patient with history of coronary artery disease status post coronary artery bypass grafting with redo bypass in 2013. Stable;SNF - cont Bblocker, ASA, statin

## 2015-01-04 NOTE — Assessment & Plan Note (Signed)
intertrochanteric fracture. -Orthopedic surgery was consulted as she was taken to the operating room on 12/26/2014 undergoing open reduction and internal fixation. Tolerated procedure well there were no immediate complications. Stable postop. Orthopedics recommend aspirin 325 mg by mouth twice a day for DVT prophylaxis for month SNF - Ot/PT, ASA 325 for one month

## 2015-01-04 NOTE — Assessment & Plan Note (Signed)
blood pressures are well controlled  SNF - continue Toprol-XL 50 mg by mouth daily and Lasix 20 mg by mouth daily

## 2015-01-04 NOTE — Assessment & Plan Note (Signed)
SNF- TSH 1.3; continue Synthroid 100 g by mouth daily

## 2015-01-04 NOTE — Assessment & Plan Note (Signed)
Recently presented with recurrence on the right lung Discussed with Dr. Earlie Server. She is to skip chemotherapy this week then resume next Monday. Discussed with Dr. Lisbeth Renshaw who is on-call for Dr. Tammi Klippel. He will reschedule radiation therapy likely sometime this week and then get back on schedule next Monday

## 2015-01-05 ENCOUNTER — Ambulatory Visit (HOSPITAL_BASED_OUTPATIENT_CLINIC_OR_DEPARTMENT_OTHER): Payer: Medicare Other

## 2015-01-05 ENCOUNTER — Ambulatory Visit
Admission: RE | Admit: 2015-01-05 | Discharge: 2015-01-05 | Disposition: A | Discharge status: Home / Self Care | Payer: Medicare Other | Source: Ambulatory Visit | Attending: Radiation Oncology | Admitting: Radiation Oncology

## 2015-01-05 ENCOUNTER — Encounter: Payer: Self-pay | Admitting: Internal Medicine

## 2015-01-05 ENCOUNTER — Ambulatory Visit (HOSPITAL_BASED_OUTPATIENT_CLINIC_OR_DEPARTMENT_OTHER): Payer: Medicare Other | Admitting: Internal Medicine

## 2015-01-05 ENCOUNTER — Other Ambulatory Visit (HOSPITAL_BASED_OUTPATIENT_CLINIC_OR_DEPARTMENT_OTHER): Payer: Medicare Other

## 2015-01-05 ENCOUNTER — Encounter: Payer: Self-pay | Admitting: *Deleted

## 2015-01-05 VITALS — BP 122/57 | HR 73 | Temp 98.5°F | Resp 20 | Ht 61.0 in

## 2015-01-05 DIAGNOSIS — Z5111 Encounter for antineoplastic chemotherapy: Secondary | ICD-10-CM | POA: Diagnosis not present

## 2015-01-05 DIAGNOSIS — Z510 Encounter for antineoplastic radiation therapy: Secondary | ICD-10-CM | POA: Diagnosis not present

## 2015-01-05 DIAGNOSIS — C3411 Malignant neoplasm of upper lobe, right bronchus or lung: Secondary | ICD-10-CM | POA: Diagnosis not present

## 2015-01-05 LAB — CBC WITH DIFFERENTIAL/PLATELET
BASO%: 0.1 % (ref 0.0–2.0)
Basophils Absolute: 0 10*3/uL (ref 0.0–0.1)
EOS ABS: 0.2 10*3/uL (ref 0.0–0.5)
EOS%: 2.2 % (ref 0.0–7.0)
HEMATOCRIT: 31.8 % — AB (ref 34.8–46.6)
HEMOGLOBIN: 9.9 g/dL — AB (ref 11.6–15.9)
LYMPH%: 8.1 % — AB (ref 14.0–49.7)
MCH: 27.9 pg (ref 25.1–34.0)
MCHC: 31.1 g/dL — ABNORMAL LOW (ref 31.5–36.0)
MCV: 89.6 fL (ref 79.5–101.0)
MONO#: 0.6 10*3/uL (ref 0.1–0.9)
MONO%: 6.8 % (ref 0.0–14.0)
NEUT%: 82.8 % — ABNORMAL HIGH (ref 38.4–76.8)
NEUTROS ABS: 7.2 10*3/uL — AB (ref 1.5–6.5)
PLATELETS: 222 10*3/uL (ref 145–400)
RBC: 3.55 10*6/uL — AB (ref 3.70–5.45)
RDW: 20.2 % — ABNORMAL HIGH (ref 11.2–14.5)
WBC: 8.7 10*3/uL (ref 3.9–10.3)
lymph#: 0.7 10*3/uL — ABNORMAL LOW (ref 0.9–3.3)

## 2015-01-05 LAB — COMPREHENSIVE METABOLIC PANEL (CC13)
ALBUMIN: 3 g/dL — AB (ref 3.5–5.0)
ALK PHOS: 125 U/L (ref 40–150)
ALT: 23 U/L (ref 0–55)
ANION GAP: 9 meq/L (ref 3–11)
AST: 20 U/L (ref 5–34)
BILIRUBIN TOTAL: 0.98 mg/dL (ref 0.20–1.20)
BUN: 16.8 mg/dL (ref 7.0–26.0)
CALCIUM: 9.4 mg/dL (ref 8.4–10.4)
CO2: 30 mEq/L — ABNORMAL HIGH (ref 22–29)
Chloride: 99 mEq/L (ref 98–109)
Creatinine: 0.8 mg/dL (ref 0.6–1.1)
EGFR: 69 mL/min/{1.73_m2} — AB (ref >90)
Glucose: 148 mg/dl — ABNORMAL HIGH (ref 70–140)
Potassium: 4 mEq/L (ref 3.5–5.1)
Sodium: 139 mEq/L (ref 136–145)
TOTAL PROTEIN: 6.4 g/dL (ref 6.4–8.3)

## 2015-01-05 MED ORDER — SODIUM CHLORIDE 0.9 % IV SOLN
45.0000 mg/m2 | Freq: Once | INTRAVENOUS | Status: AC
  Administered 2015-01-05: 72 mg via INTRAVENOUS
  Filled 2015-01-05: qty 12

## 2015-01-05 MED ORDER — DIPHENHYDRAMINE HCL 50 MG/ML IJ SOLN
50.0000 mg | Freq: Once | INTRAMUSCULAR | Status: AC
  Administered 2015-01-05: 50 mg via INTRAVENOUS

## 2015-01-05 MED ORDER — SODIUM CHLORIDE 0.9 % IV SOLN
142.4000 mg | Freq: Once | INTRAVENOUS | Status: AC
  Administered 2015-01-05: 140 mg via INTRAVENOUS
  Filled 2015-01-05: qty 14

## 2015-01-05 MED ORDER — FAMOTIDINE IN NACL 20-0.9 MG/50ML-% IV SOLN
INTRAVENOUS | Status: AC
  Filled 2015-01-05: qty 50

## 2015-01-05 MED ORDER — FAMOTIDINE IN NACL 20-0.9 MG/50ML-% IV SOLN
20.0000 mg | Freq: Once | INTRAVENOUS | Status: AC
Start: 2015-01-05 — End: 2015-01-05
  Administered 2015-01-05: 20 mg via INTRAVENOUS

## 2015-01-05 MED ORDER — SODIUM CHLORIDE 0.9 % IV SOLN
Freq: Once | INTRAVENOUS | Status: AC
  Administered 2015-01-05: 14:00:00 via INTRAVENOUS

## 2015-01-05 MED ORDER — SODIUM CHLORIDE 0.9 % IV SOLN
Freq: Once | INTRAVENOUS | Status: AC
  Administered 2015-01-05: 15:00:00 via INTRAVENOUS
  Filled 2015-01-05: qty 8

## 2015-01-05 MED ORDER — DIPHENHYDRAMINE HCL 50 MG/ML IJ SOLN
INTRAMUSCULAR | Status: AC
  Filled 2015-01-05: qty 1

## 2015-01-05 MED ORDER — PACLITAXEL CHEMO INJECTION 300 MG/50ML: 45.0000 mg/m2 | Freq: Once | INTRAVENOUS | Status: DC

## 2015-01-05 NOTE — Patient Instructions (Signed)
[facility] Discharge Instructions for Patients Receiving Chemotherapy  Today you received the following chemotherapy agents Taxol/Carboplatin.  To help prevent nausea and vomiting after your treatment, we encourage you to take your nausea medication as directed.   If you develop nausea and vomiting that is not controlled by your nausea medication, call the clinic.   BELOW ARE SYMPTOMS THAT SHOULD BE REPORTED IMMEDIATELY:  *FEVER GREATER THAN 100.5 F  *CHILLS WITH OR WITHOUT FEVER  NAUSEA AND VOMITING THAT IS NOT CONTROLLED WITH YOUR NAUSEA MEDICATION  *UNUSUAL SHORTNESS OF BREATH  *UNUSUAL BRUISING OR BLEEDING  TENDERNESS IN MOUTH AND THROAT WITH OR WITHOUT PRESENCE OF ULCERS  *URINARY PROBLEMS  *BOWEL PROBLEMS  UNUSUAL RASH Items with * indicate a potential emergency and should be followed up as soon as possible.  Feel free to call the clinic you have any questions or concerns. The clinic phone number is [phone].  Please show the [facility] at check-in to the Emergency Department and triage nurse.

## 2015-01-05 NOTE — Progress Notes (Signed)
Oncology Nurse Navigator Documentation  Oncology Nurse Navigator Flowsheets 01/05/2015  Navigator Encounter Type Clinic/MDC/spoke with patient in chemo room today.  No barriers identified.  Patient resting with her daughter.   Patient Visit Type Follow-up  Treatment Phase Treatment  Barriers/Navigation Needs No barriers at this time  Education -  Interventions -  Coordination of Care -  Time Spent with Patient 15

## 2015-01-05 NOTE — Progress Notes (Signed)
[place] [phone]   [phone]  OFFICE PROGRESS NOTE  [name], MD [address] [place] [address]  DIAGNOSIS: Stage IIIA (T2a, N2, M0) non-small cell lung cancer, squamous cell carcinoma presented with recurrence on the right upper lobe in addition to mediastinal lymphadenopathy diagnosed in September 2016.  PRIOR THERAPY:None  CURRENT THERAPY: Concurrent chemoradiation with weekly carboplatin for AUC of 2 and paclitaxel 45 MG/M2. S/P 3 cycles.  INTERVAL HISTORY: Marissa [name] 81 y.o. female returns to the clinic today for follow-up visit accompanied by her daughter. The patient is feeling fine today was no specific complaints. She is tolerating her course of concurrent chemoradiation fairly well with no significant adverse effects. She denied having any significant dysphagia or odynophagia. She denied having any significant nausea or vomiting. She has no fever or chills. She denied having any significant weight loss or night sweats. The patient has no chest pain and she has improvement in her dyspnea with no cough or hemoptysis. She is here today to start cycle #4.  MEDICAL HISTORY: Past Medical History  Diagnosis Date  . HTN (hypertension)   . Hypercholesteremia   . CAD (coronary artery disease)     s/p CABG 03/2011  . Aortic valve disorders   . RLS (restless legs syndrome)   . A-fib ([place])   . Osteopenia   . Mitral valve disease     s/p MVR 03/2011 w/ 27 mm porcine valve  . Hypothyroidism   . COPD (chronic obstructive pulmonary disease) ([facility])   . CHF (congestive heart failure) ([facility])   . Aortic stenosis     (s/p AVR w/ 21 mm porcine valve 03/2011  . Mastoiditis   . Myocardial infarction ([facility])     "slight"  . Dysrhythmia   . Shortness of breath     with exertion  . Pre-diabetes   . GERD (gastroesophageal reflux disease)     takes nexium prn  . Arthritis   . History of blood transfusion    Rheumatic Fever  . Lung cancer ([place])   . Pneumonia   . Diabetes mellitus without complication ([place])   . Full code status 12/22/2014  . Full code status 12/22/2014    ALLERGIES:  is allergic to coumadin and prozac.  MEDICATIONS:  Current Outpatient Prescriptions  Medication Sig Dispense Refill  . acetaminophen (TYLENOL) 325 MG tablet Take 2 tablets (650 mg total) by mouth every 6 (six) hours as needed for mild pain (or Fever >/= 101).    [name] alendronate (FOSAMAX) 70 MG tablet Take 70 mg by mouth once a week.     [name] aspirin EC 325 MG tablet Take 1 tablet (325 mg total) by mouth 2 (two) times daily. 30 tablet 0  . atorvastatin (LIPITOR) 40 MG tablet Take 40 mg by mouth daily.     [name] CALCIUM CARBONATE PO Take 1 tablet by mouth 2 (two) times daily.    . digoxin (LANOXIN) 0.125 MG tablet Take 0.125 mg by mouth daily.     [name] docusate sodium (COLACE) 100 MG capsule Take 1 capsule (100 mg total) by mouth 2 (two) times daily. 10 capsule 0  . esomeprazole (NEXIUM) 40 MG capsule Take 40 mg by mouth daily as needed (for acid reflux).     . feeding supplement, ENSURE ENLIVE, (ENSURE ENLIVE) LIQD Take 237 mLs by mouth 2 (two) times daily between meals. 237 mL 12  . fluticasone-salmeterol (ADVAIR HFA) 115-21 MCG/ACT  inhaler Inhale 2 puffs into the lungs 2 (two) times daily.     . furosemide (LASIX) 40 MG tablet Take 20-40 mg by mouth 2 (two) times daily. Take 40 mg by mouth in the morning and take 20 mg by mouth in the evening for fluid.    [name] glimepiride (AMARYL) 1 MG tablet Take 1 mg by mouth daily with breakfast.    . HYDROcodone-acetaminophen (NORCO) 5-325 MG tablet Take 1 tablet by mouth every 6 (six) hours as needed. 60 tablet 0  . Ipratropium-Albuterol (COMBIVENT) 20-100 MCG/ACT AERS respimat Inhale 1 puff into the lungs every 6 (six) hours as needed for wheezing or shortness of breath.     [name] ipratropium-albuterol (DUONEB) 0.5-2.5 (3) MG/3ML SOLN Take 3 mLs by nebulization every 6 (six) hours as needed  (for shortness of breath).     [name] levothyroxine (SYNTHROID, LEVOTHROID) 100 MCG tablet Take 100 mcg by mouth daily before breakfast.     . metoprolol succinate (TOPROL-XL) 50 MG 24 hr tablet Take 1 tablet (50 mg total) by mouth every evening. 90 tablet 3  . ondansetron (ZOFRAN) 4 MG tablet Take 1 tablet (4 mg total) by mouth every 6 (six) hours as needed for nausea. 20 tablet 0  . potassium chloride SA (K-DUR,KLOR-CON) 20 MEQ tablet Take 20 mEq by mouth daily.     . prochlorperazine (COMPAZINE) 10 MG tablet Take 1 tablet (10 mg total) by mouth every 6 (six) hours as needed for nausea or vomiting. 30 tablet 0  . rOPINIRole (REQUIP) 4 MG tablet Take 4 mg by mouth at bedtime.    . senna-docusate (SENOKOT-S) 8.6-50 MG tablet Take 1 tablet by mouth at bedtime as needed for mild constipation.    . vitamin C (ASCORBIC ACID) 500 MG tablet Take 500 mg by mouth daily.    . Wound Cleansers (RADIAPLEX EX) Apply 1 application topically daily as needed (rain).      No current facility-administered medications for this visit.    SURGICAL HISTORY:  Past Surgical History  Procedure Laterality Date  . Mitral valve replacement      1990's  . Laparoscopic cholecystectomy    . Aortic valve replacement    . Tonsillectomy    . Coronary artery bypass graft  03/2011    1990's also  . Video bronchoscopy with endobronchial navigation N/A 01/06/2014    Procedure: VIDEO BRONCHOSCOPY WITH ENDOBRONCHIAL NAVIGATION;  Surgeon: [name], MD;  Location: [facility];  Service: Thoracic;  Laterality: N/A;  . Video bronchoscopy with endobronchial ultrasound N/A 01/06/2014    Procedure: VIDEO BRONCHOSCOPY WITH ENDOBRONCHIAL ULTRASOUND;  Surgeon: [name], MD;  Location: [facility];  Service: Thoracic;  Laterality: N/A;  . Video bronchoscopy with endobronchial navigation N/A 11/21/2014    Procedure: VIDEO BRONCHOSCOPY WITH ENDOBRONCHIAL NAVIGATION;  Surgeon: [name], MD;  Location: [facility];  Service:  Thoracic;  Laterality: N/A;  . Video bronchoscopy with endobronchial ultrasound N/A 11/21/2014    Procedure: VIDEO BRONCHOSCOPY WITH ENDOBRONCHIAL ULTRASOUND;  Surgeon: [name], MD;  Location: [facility];  Service: Thoracic;  Laterality: N/A;  . Compression hip screw Left 12/26/2014    Procedure: ORIF LEFT HIP INTERTROCHANTRIC FRACTURE;  Surgeon: Frederik Pear, MD;  Location: [facility];  Service: Orthopedics;  Laterality: Left;    REVIEW OF SYSTEMS:  A comprehensive review of systems was negative except for: Respiratory: positive for dyspnea on exertion   PHYSICAL EXAMINATION: General appearance: alert, cooperative, fatigued and no distress Head: Normocephalic, without obvious  abnormality, atraumatic Neck: no adenopathy, no JVD, supple, symmetrical, trachea midline and thyroid not enlarged, symmetric, no tenderness/mass/nodules Lymph nodes: Cervical, supraclavicular, and axillary nodes normal. Resp: clear to auscultation bilaterally Back: symmetric, no curvature. ROM normal. No CVA tenderness. Cardio: regular rate and rhythm, S1, S2 normal, no murmur, click, rub or gallop GI: soft, non-tender; bowel sounds normal; no masses,  no organomegaly Extremities: extremities normal, atraumatic, no cyanosis or edema  ECOG PERFORMANCE STATUS: 1 - Symptomatic but completely ambulatory  There were no vitals taken for this visit.  LABORATORY DATA: Lab Results  Component Value Date   WBC 8.7 01/05/2015   HGB 9.9* 01/05/2015   HCT 31.8* 01/05/2015   MCV 89.6 01/05/2015   PLT 222 01/05/2015      Chemistry      Component Value Date/Time   NA 139 01/05/2015 1118   NA 133* 12/29/2014 0010   NA 137 04/17/2014 0409   K 4.0 01/05/2015 1118   K 4.4 12/29/2014 0010   K 2.8* 04/18/2014 0422   CL 93* 12/29/2014 0010   CL 98 04/17/2014 0409   CO2 30* 01/05/2015 1118   CO2 31 12/29/2014 0010   CO2 30 04/17/2014 0409   BUN 16.8 01/05/2015 1118   BUN 17 12/29/2014 0010   BUN 19* 04/17/2014 0409    CREATININE 0.8 01/05/2015 1118   CREATININE 0.84 12/29/2014 0010   CREATININE 1.08 04/17/2014 0409      Component Value Date/Time   CALCIUM 9.4 01/05/2015 1118   CALCIUM 8.3* 12/29/2014 0010   CALCIUM 9.0 04/17/2014 0409   ALKPHOS 125 01/05/2015 1118   ALKPHOS 108 12/26/2014 1450   AST 20 01/05/2015 1118   AST 159* 12/26/2014 1450   ALT 23 01/05/2015 1118   ALT 170* 12/26/2014 1450   BILITOT 0.98 01/05/2015 1118   BILITOT 1.0 12/26/2014 1450       RADIOGRAPHIC STUDIES: Dg Chest 1 View  12/25/2014  CLINICAL DATA:  LEFT hip fracture. EXAM: CHEST 1 VIEW COMPARISON:  Chest radiograph November 21, 2014 and CT chest November 20, 2014. FINDINGS: Masslike consolidation RIGHT upper lobe, decreased from prior study. Similar pulmonary vascular congestion and fullness of the RIGHT hilum corresponding to lymphadenopathy on recent CT chest. The cardiac silhouette is mildly enlarged. Status post median sternotomy for cardiac valve replacement and CABG. Similar LEFT lung base atelectasis/ scarring. No pleural effusion. No pneumothorax. Partially imaged screw in RIGHT humeral head. IMPRESSION: Improving RIGHT upper lobe consolidation/mass, recommend continued surveillance. Mild cardiomegaly, pulmonary vascular congestion. Electronically Signed   By: Elon Alas M.D.   On: 12/25/2014 22:42   Mr Jeri Cos IO Contrast  12/31/2014  CLINICAL DATA:  Right upper lobe lung cancer.  Staging. EXAM: MRI HEAD WITHOUT AND WITH CONTRAST TECHNIQUE: Multiplanar, multiecho pulse sequences of the brain and surrounding structures were obtained without and with intravenous contrast. CONTRAST:  20m MULTIHANCE GADOBENATE DIMEGLUMINE 529 MG/ML IV SOLN COMPARISON:  None. FINDINGS: There is no evidence of acute infarct, mass, midline shift, or extra-axial fluid collection. A focus of chronic microhemorrhage is noted in the left parietal operculum. There is moderate generalized cerebral atrophy. Small foci of T2 hyperintensity  in the subcortical and deep cerebral white matter are nonspecific but compatible with mild chronic small vessel ischemic disease. Focal transcortical encephalomalacia is noted in the inferior right temporal lobe. No abnormal enhancement is identified. Prior bilateral cataract extraction is noted. Paranasal sinuses are clear. There is a small right mastoid effusion. Major intracranial vascular flow voids are preserved.  IMPRESSION: 1. No evidence of intracranial metastases. 2. Mild chronic small vessel ischemic disease and moderate cerebral atrophy. 3. Focal encephalomalacia in the right temporal lobe which may reflect remote trauma or ischemia. Electronically Signed   By: Logan Bores M.D.   On: 12/31/2014 10:07   Pelvis Portable  12/26/2014  CLINICAL DATA:  ORIF left hip. EXAM: PORTABLE PELVIS 1-2 VIEWS COMPARISON:  PET-CT 11/10/2014. FINDINGS: ORIF left hip with good anatomic alignment on AP view. Hardware intact. Surgical clips in the pelvis. IMPRESSION: ORIF left hip with good anatomic alignment on AP view. Electronically Signed   By: Marcello Moores  Register   On: 12/26/2014 12:44   Dg Hip Operative Unilat With Pelvis Left  12/26/2014  CLINICAL DATA:  Intraoperative fluoro spot images from ORIF of the left hip EXAM: OPERATIVE left HIP (WITH PELVIS IF PERFORMED) 2 VIEWS TECHNIQUE: Fluoroscopic spot image(s) were submitted for interpretation post-operatively. Fluoro time reported is 1 minutes, 4 seconds COMPARISON:  Preoperative left hip series of December 25, 2014 FINDINGS: The patient has undergone telescoping nail and sideplate fixation of an intertrochanteric fracture. Alignment of the fracture fragments is more near anatomic. There is avulsion of the lesser trochanter. IMPRESSION: The patient has undergone ORIF for an intertrochanteric fracture of the left hip. There is no immediate postprocedure complication. Electronically Signed   By: David  Martinique M.D.   On: 12/26/2014 11:43   Dg Hip Unilat With Pelvis 2-3  Views Left  12/25/2014  CLINICAL DATA:  Fall EXAM: DG HIP (WITH OR WITHOUT PELVIS) 2-3V LEFT COMPARISON:  None. FINDINGS: Comminuted intertrochanteric left femur fracture. Lesser trochanter is displaced. Greater trochanter is also displaced. Slight varus deformity. Postop changes in the pelvis. Advanced lumbar spine degenerative change. IMPRESSION: Comminuted left intertrochanteric femur fracture. Electronically Signed   By: Marybelle Killings M.D.   On: 12/25/2014 22:39    ASSESSMENT AND PLAN: This is a very pleasant 81 years old white female with recurrent non-small cell lung cancer presented as a stage IIIa, squamous cell carcinoma. The patient is currently undergoing a course of concurrent chemoradiation with weekly carboplatin and paclitaxel. She status post 3 cycles. She is tolerating her treatment well. I recommended for her to proceed with cycle #4 today as a scheduled. The patient would come back for follow-up visit in 2 weeks for reevaluation and management of any adverse effect of her treatment. The patient was advised to call immediately if she has any concerning symptoms in the interval. The patient voices understanding of current disease status and treatment options and is in agreement with the current care plan.  All questions were answered. The patient knows to call the clinic with any problems, questions or concerns. We can certainly see the patient much sooner if necessary.  Disclaimer: This note was dictated with voice recognition software. Similar sounding words can inadvertently be transcribed and may not be corrected upon review.

## 2015-01-06 ENCOUNTER — Ambulatory Visit
Admission: RE | Admit: 2015-01-06 | Discharge: 2015-01-06 | Disposition: A | Discharge status: Home / Self Care | Payer: Medicare Other | Source: Ambulatory Visit | Attending: Radiation Oncology | Admitting: Radiation Oncology

## 2015-01-06 DIAGNOSIS — C3411 Malignant neoplasm of upper lobe, right bronchus or lung: Secondary | ICD-10-CM | POA: Diagnosis not present

## 2015-01-06 DIAGNOSIS — Z510 Encounter for antineoplastic radiation therapy: Secondary | ICD-10-CM | POA: Diagnosis not present

## 2015-01-07 ENCOUNTER — Ambulatory Visit
Admission: RE | Admit: 2015-01-07 | Discharge: 2015-01-07 | Disposition: A | Discharge status: Home / Self Care | Payer: Medicare Other | Source: Ambulatory Visit | Attending: Radiation Oncology | Admitting: Radiation Oncology

## 2015-01-07 DIAGNOSIS — C3411 Malignant neoplasm of upper lobe, right bronchus or lung: Secondary | ICD-10-CM | POA: Diagnosis not present

## 2015-01-07 DIAGNOSIS — Z510 Encounter for antineoplastic radiation therapy: Secondary | ICD-10-CM | POA: Diagnosis not present

## 2015-01-08 ENCOUNTER — Ambulatory Visit
Admission: RE | Admit: 2015-01-08 | Discharge: 2015-01-08 | Disposition: A | Discharge status: Home / Self Care | Payer: Medicare Other | Source: Ambulatory Visit | Attending: Radiation Oncology | Admitting: Radiation Oncology

## 2015-01-08 DIAGNOSIS — C3411 Malignant neoplasm of upper lobe, right bronchus or lung: Secondary | ICD-10-CM | POA: Diagnosis not present

## 2015-01-08 DIAGNOSIS — Z510 Encounter for antineoplastic radiation therapy: Secondary | ICD-10-CM | POA: Diagnosis not present

## 2015-01-09 ENCOUNTER — Ambulatory Visit
Admission: RE | Admit: 2015-01-09 | Discharge: 2015-01-09 | Disposition: A | Discharge status: Home / Self Care | Payer: Medicare Other | Source: Ambulatory Visit | Attending: Radiation Oncology | Admitting: Radiation Oncology

## 2015-01-09 ENCOUNTER — Encounter: Payer: Self-pay | Admitting: Radiation Oncology

## 2015-01-09 VITALS — BP 145/59 | HR 79 | Resp 16

## 2015-01-09 DIAGNOSIS — C3411 Malignant neoplasm of upper lobe, right bronchus or lung: Secondary | ICD-10-CM

## 2015-01-09 DIAGNOSIS — Z510 Encounter for antineoplastic radiation therapy: Secondary | ICD-10-CM | POA: Diagnosis not present

## 2015-01-09 NOTE — Progress Notes (Signed)
Weight and vitals stable. Denies pain at this time. Reports a productive cough with clear sputum. Denies hemoptysis. Denies difficulty swallowing or sore throat. Hyperpigmentation without desquamation of right upper back. Reports using radiaplex as directed. Patient participating in physical therapy a rehab facility. Patient hoping to get strong and be able to ambulate up stairs so she can return home with her daughter.   BP 145/59 mmHg  Pulse 79  Resp 16  Wt   SpO2 100% Wt Readings from Last 3 Encounters:  12/27/14 139 lb (63.05 kg)  12/25/14 139 lb 12.8 oz (63.413 kg)  12/22/14 140 lb 6.4 oz (63.685 kg)

## 2015-01-09 NOTE — Progress Notes (Signed)
Department of Radiation Oncology  Phone:  [phone] Fax:        [phone]  Weekly Treatment Note    Name: Marissa [name] Date: 01/09/2015 MRN: [id number] DOB: Apr 25, 1933   Current dose: 46 Gy  Current fraction: 23   MEDICATIONS: Current Outpatient Prescriptions  Medication Sig Dispense Refill  . acetaminophen (TYLENOL) 325 MG tablet Take 2 tablets (650 mg total) by mouth every 6 (six) hours as needed for mild pain (or Fever >/= 101).    [name] alendronate (FOSAMAX) 70 MG tablet Take 70 mg by mouth once a week.     [name] aspirin EC 325 MG tablet Take 1 tablet (325 mg total) by mouth 2 (two) times daily. 30 tablet 0  . atorvastatin (LIPITOR) 40 MG tablet Take 40 mg by mouth daily.     [name] CALCIUM CARBONATE PO Take 1 tablet by mouth 2 (two) times daily.    . digoxin (LANOXIN) 0.125 MG tablet Take 0.125 mg by mouth daily.     [name] docusate sodium (COLACE) 100 MG capsule Take 1 capsule (100 mg total) by mouth 2 (two) times daily. 10 capsule 0  . esomeprazole (NEXIUM) 40 MG capsule Take 40 mg by mouth daily as needed (for acid reflux).     . feeding supplement, ENSURE ENLIVE, (ENSURE ENLIVE) LIQD Take 237 mLs by mouth 2 (two) times daily between meals. 237 mL 12  . fluticasone-salmeterol (ADVAIR HFA) 115-21 MCG/ACT inhaler Inhale 2 puffs into the lungs 2 (two) times daily.     . furosemide (LASIX) 40 MG tablet Take 20-40 mg by mouth 2 (two) times daily. Take 40 mg by mouth in the morning and take 20 mg by mouth in the evening for fluid.    [name] glimepiride (AMARYL) 1 MG tablet Take 1 mg by mouth daily with breakfast.    . HYDROcodone-acetaminophen (NORCO) 5-325 MG tablet Take 1 tablet by mouth every 6 (six) hours as needed. 60 tablet 0  . Ipratropium-Albuterol (COMBIVENT) 20-100 MCG/ACT AERS respimat Inhale 1 puff into the lungs every 6 (six) hours as needed for wheezing or shortness of breath.     [name] ipratropium-albuterol (DUONEB) 0.5-2.5 (3) MG/3ML SOLN Take 3 mLs by nebulization every 6  (six) hours as needed (for shortness of breath).     [name] levothyroxine (SYNTHROID, LEVOTHROID) 100 MCG tablet Take 100 mcg by mouth daily before breakfast.     . metoprolol succinate (TOPROL-XL) 50 MG 24 hr tablet Take 1 tablet (50 mg total) by mouth every evening. 90 tablet 3  . potassium chloride SA (K-DUR,KLOR-CON) 20 MEQ tablet Take 20 mEq by mouth daily.     [name] rOPINIRole (REQUIP) 4 MG tablet Take 4 mg by mouth at bedtime.    . senna-docusate (SENOKOT-S) 8.6-50 MG tablet Take 1 tablet by mouth at bedtime as needed for mild constipation.    . vitamin C (ASCORBIC ACID) 500 MG tablet Take 500 mg by mouth daily.    . Wound Cleansers (RADIAPLEX EX) Apply 1 application topically daily as needed (rain).     . ondansetron (ZOFRAN) 4 MG tablet Take 1 tablet (4 mg total) by mouth every 6 (six) hours as needed for nausea. (Patient not taking: Reported on 01/09/2015) 20 tablet 0  . prochlorperazine (COMPAZINE) 10 MG tablet Take 1 tablet (10 mg total) by mouth every 6 (six) hours as needed for nausea or vomiting. (Patient not taking: Reported on 01/09/2015) 30 tablet 0   No current facility-administered medications for this encounter.  ALLERGIES: Coumadin and Prozac   LABORATORY DATA:  Lab Results  Component Value Date   WBC 8.7 01/05/2015   HGB 9.9* 01/05/2015   HCT 31.8* 01/05/2015   MCV 89.6 01/05/2015   PLT 222 01/05/2015   Lab Results  Component Value Date   NA 139 01/05/2015   K 4.0 01/05/2015   CL 93* 12/29/2014   CO2 30* 01/05/2015   Lab Results  Component Value Date   ALT 23 01/05/2015   AST 20 01/05/2015   ALKPHOS 125 01/05/2015   BILITOT 0.98 01/05/2015     NARRATIVE: Marissa [name] was seen today for weekly treatment management. The chart was checked and the patient's films were reviewed.  Weight and vitals stable. Denies pain at this time. Reports a productive cough with clear sputum. Denies hemoptysis. Denies difficulty swallowing or sore throat.  Hyperpigmentation without desquamation of right upper back. Reports using radiaplex as directed. Patient participating in physical therapy at rehab facility. Patient hoping to get strong and be able to ambulate up stairs so she can return home with her daughter. Denies heartburn.   PHYSICAL EXAMINATION: blood pressure is 145/59 and her pulse is 79. Her respiration is 16 and oxygen saturation is 100%.      Some erythema and early dry desquamation in the treated area.  ASSESSMENT: The patient is doing satisfactorily with treatment.  PLAN: We will continue with the patient's radiation treatment as planned. I have advised for the patient to continue applying skin care cream to the treated area.  This document serves as a record of services personally performed by Kyung Rudd, MD. It was created on his behalf by Arlyce Harman, a trained medical scribe. The creation of this record is based on the scribe's personal observations and the provider's statements to them. This document has been checked and approved by the attending provider. ------------------------------------------------  Jodelle Gross, MD, PhD

## 2015-01-12 ENCOUNTER — Ambulatory Visit (HOSPITAL_BASED_OUTPATIENT_CLINIC_OR_DEPARTMENT_OTHER): Payer: Medicare Other

## 2015-01-12 ENCOUNTER — Other Ambulatory Visit (HOSPITAL_BASED_OUTPATIENT_CLINIC_OR_DEPARTMENT_OTHER): Payer: Medicare Other

## 2015-01-12 ENCOUNTER — Ambulatory Visit
Admission: RE | Admit: 2015-01-12 | Discharge: 2015-01-12 | Disposition: A | Discharge status: Home / Self Care | Payer: Medicare Other | Source: Ambulatory Visit | Attending: Radiation Oncology | Admitting: Radiation Oncology

## 2015-01-12 VITALS — BP 143/58 | HR 85 | Temp 98.8°F | Resp 20

## 2015-01-12 DIAGNOSIS — C3411 Malignant neoplasm of upper lobe, right bronchus or lung: Secondary | ICD-10-CM

## 2015-01-12 DIAGNOSIS — Z5111 Encounter for antineoplastic chemotherapy: Secondary | ICD-10-CM | POA: Diagnosis not present

## 2015-01-12 DIAGNOSIS — Z510 Encounter for antineoplastic radiation therapy: Secondary | ICD-10-CM | POA: Diagnosis not present

## 2015-01-12 LAB — COMPREHENSIVE METABOLIC PANEL (CC13)
ALT: 15 U/L (ref 0–55)
AST: 17 U/L (ref 5–34)
Albumin: 3.4 g/dL — ABNORMAL LOW (ref 3.5–5.0)
Alkaline Phosphatase: 255 U/L — ABNORMAL HIGH (ref 40–150)
Anion Gap: 12 mEq/L — ABNORMAL HIGH (ref 3–11)
BUN: 11.3 mg/dL (ref 7.0–26.0)
CALCIUM: 9.8 mg/dL (ref 8.4–10.4)
CHLORIDE: 103 meq/L (ref 98–109)
CO2: 27 meq/L (ref 22–29)
Creatinine: 0.9 mg/dL (ref 0.6–1.1)
EGFR: 63 mL/min/{1.73_m2} — ABNORMAL LOW (ref >90)
Glucose: 162 mg/dl — ABNORMAL HIGH (ref 70–140)
POTASSIUM: 3.5 meq/L (ref 3.5–5.1)
SODIUM: 142 meq/L (ref 136–145)
Total Bilirubin: 0.78 mg/dL (ref 0.20–1.20)
Total Protein: 6.6 g/dL (ref 6.4–8.3)

## 2015-01-12 LAB — CBC WITH DIFFERENTIAL/PLATELET
BASO%: 0.6 % (ref 0.0–2.0)
Basophils Absolute: 0.1 10*3/uL (ref 0.0–0.1)
EOS%: 2.1 % (ref 0.0–7.0)
Eosinophils Absolute: 0.2 10*3/uL (ref 0.0–0.5)
HCT: 33.5 % — ABNORMAL LOW (ref 34.8–46.6)
HEMOGLOBIN: 11.1 g/dL — AB (ref 11.6–15.9)
LYMPH#: 0.7 10*3/uL — AB (ref 0.9–3.3)
LYMPH%: 8.5 % — ABNORMAL LOW (ref 14.0–49.7)
MCH: 29 pg (ref 25.1–34.0)
MCHC: 33.1 g/dL (ref 31.5–36.0)
MCV: 87.4 fL (ref 79.5–101.0)
MONO#: 0.6 10*3/uL (ref 0.1–0.9)
MONO%: 8.1 % (ref 0.0–14.0)
NEUT#: 6.4 10*3/uL (ref 1.5–6.5)
NEUT%: 80.7 % — AB (ref 38.4–76.8)
PLATELETS: 236 10*3/uL (ref 145–400)
RBC: 3.83 10*6/uL (ref 3.70–5.45)
RDW: 21.9 % — AB (ref 11.2–14.5)
WBC: 8 10*3/uL (ref 3.9–10.3)

## 2015-01-12 MED ORDER — FAMOTIDINE IN NACL 20-0.9 MG/50ML-% IV SOLN
20.0000 mg | Freq: Once | INTRAVENOUS | Status: AC
Start: 2015-01-12 — End: 2015-01-12
  Administered 2015-01-12: 20 mg via INTRAVENOUS

## 2015-01-12 MED ORDER — FAMOTIDINE IN NACL 20-0.9 MG/50ML-% IV SOLN
INTRAVENOUS | Status: AC
  Filled 2015-01-12: qty 50

## 2015-01-12 MED ORDER — SODIUM CHLORIDE 0.9 % IV SOLN
142.4000 mg | Freq: Once | INTRAVENOUS | Status: AC
  Administered 2015-01-12: 140 mg via INTRAVENOUS
  Filled 2015-01-12: qty 14

## 2015-01-12 MED ORDER — DIPHENHYDRAMINE HCL 50 MG/ML IJ SOLN
INTRAMUSCULAR | Status: AC
Start: 2015-01-12 — End: 2015-01-12
  Filled 2015-01-12: qty 1

## 2015-01-12 MED ORDER — SODIUM CHLORIDE 0.9 % IV SOLN
45.0000 mg/m2 | Freq: Once | INTRAVENOUS | Status: AC
  Administered 2015-01-12: 72 mg via INTRAVENOUS
  Filled 2015-01-12: qty 12

## 2015-01-12 MED ORDER — SODIUM CHLORIDE 0.9 % IV SOLN
Freq: Once | INTRAVENOUS | Status: AC
  Administered 2015-01-12: 14:00:00 via INTRAVENOUS

## 2015-01-12 MED ORDER — RADIAPLEXRX EX GEL
Freq: Once | CUTANEOUS | Status: AC
  Administered 2015-01-12: 11:00:00 via TOPICAL

## 2015-01-12 MED ORDER — SODIUM CHLORIDE 0.9 % IV SOLN
Freq: Once | INTRAVENOUS | Status: AC
  Administered 2015-01-12: 14:00:00 via INTRAVENOUS
  Filled 2015-01-12: qty 8

## 2015-01-12 MED ORDER — DIPHENHYDRAMINE HCL 50 MG/ML IJ SOLN
50.0000 mg | Freq: Once | INTRAMUSCULAR | Status: AC
  Administered 2015-01-12: 50 mg via INTRAVENOUS

## 2015-01-12 NOTE — Patient Instructions (Signed)
[facility] Discharge Instructions for Patients Receiving Chemotherapy  Today you received the following chemotherapy agents taxol, carboplatin  To help prevent nausea and vomiting after your treatment, we encourage you to take your nausea medication   If you develop nausea and vomiting that is not controlled by your nausea medication, call the clinic.   BELOW ARE SYMPTOMS THAT SHOULD BE REPORTED IMMEDIATELY:  *FEVER GREATER THAN 100.5 F  *CHILLS WITH OR WITHOUT FEVER  NAUSEA AND VOMITING THAT IS NOT CONTROLLED WITH YOUR NAUSEA MEDICATION  *UNUSUAL SHORTNESS OF BREATH  *UNUSUAL BRUISING OR BLEEDING  TENDERNESS IN MOUTH AND THROAT WITH OR WITHOUT PRESENCE OF ULCERS  *URINARY PROBLEMS  *BOWEL PROBLEMS  UNUSUAL RASH Items with * indicate a potential emergency and should be followed up as soon as possible.  Feel free to call the clinic you have any questions or concerns. The clinic phone number is [phone].  Please show the [facility] at check-in to the Emergency Department and triage nurse.

## 2015-01-13 ENCOUNTER — Ambulatory Visit
Admission: RE | Admit: 2015-01-13 | Discharge: 2015-01-13 | Disposition: A | Discharge status: Home / Self Care | Payer: Medicare Other | Source: Ambulatory Visit | Attending: Radiation Oncology | Admitting: Radiation Oncology

## 2015-01-13 DIAGNOSIS — C3411 Malignant neoplasm of upper lobe, right bronchus or lung: Secondary | ICD-10-CM | POA: Diagnosis not present

## 2015-01-13 MED FILL — Wound Dressings - Gel: CUTANEOUS | Qty: 170 | Status: AC

## 2015-01-14 ENCOUNTER — Ambulatory Visit
Admission: RE | Admit: 2015-01-14 | Discharge: 2015-01-14 | Disposition: A | Discharge status: Home / Self Care | Payer: Medicare Other | Source: Ambulatory Visit | Attending: Radiation Oncology | Admitting: Radiation Oncology

## 2015-01-14 DIAGNOSIS — C3411 Malignant neoplasm of upper lobe, right bronchus or lung: Secondary | ICD-10-CM | POA: Diagnosis not present

## 2015-01-15 ENCOUNTER — Ambulatory Visit
Admission: RE | Admit: 2015-01-15 | Discharge: 2015-01-15 | Disposition: A | Discharge status: Home / Self Care | Payer: Medicare Other | Source: Ambulatory Visit | Attending: Radiation Oncology | Admitting: Radiation Oncology

## 2015-01-15 DIAGNOSIS — Z510 Encounter for antineoplastic radiation therapy: Secondary | ICD-10-CM | POA: Diagnosis not present

## 2015-01-15 DIAGNOSIS — C3411 Malignant neoplasm of upper lobe, right bronchus or lung: Secondary | ICD-10-CM | POA: Diagnosis not present

## 2015-01-16 ENCOUNTER — Encounter: Payer: Self-pay | Admitting: Radiation Oncology

## 2015-01-16 ENCOUNTER — Ambulatory Visit
Admission: RE | Admit: 2015-01-16 | Discharge: 2015-01-16 | Disposition: A | Discharge status: Home / Self Care | Payer: Medicare Other | Source: Ambulatory Visit | Attending: Radiation Oncology | Admitting: Radiation Oncology

## 2015-01-16 VITALS — BP 145/61 | HR 88 | Resp 20

## 2015-01-16 DIAGNOSIS — C3411 Malignant neoplasm of upper lobe, right bronchus or lung: Secondary | ICD-10-CM | POA: Diagnosis not present

## 2015-01-16 DIAGNOSIS — Z510 Encounter for antineoplastic radiation therapy: Secondary | ICD-10-CM | POA: Diagnosis not present

## 2015-01-16 NOTE — Progress Notes (Signed)
Department of Radiation Oncology  Phone:  [phone] Fax:        [phone]  Weekly Treatment Note    Name: Marissa [name] Date: 01/16/2015 MRN: [id number] DOB: Oct 19, 1933   Current dose: 56 Gy  Current fraction: 28   MEDICATIONS: Current Outpatient Prescriptions  Medication Sig Dispense Refill  . acetaminophen (TYLENOL) 325 MG tablet Take 2 tablets (650 mg total) by mouth every 6 (six) hours as needed for mild pain (or Fever >/= 101).    [name] alendronate (FOSAMAX) 70 MG tablet Take 70 mg by mouth once a week.     [name] aspirin EC 325 MG tablet Take 1 tablet (325 mg total) by mouth 2 (two) times daily. 30 tablet 0  . atorvastatin (LIPITOR) 40 MG tablet Take 40 mg by mouth daily.     [name] CALCIUM CARBONATE PO Take 1 tablet by mouth 2 (two) times daily.    . digoxin (LANOXIN) 0.125 MG tablet Take 0.125 mg by mouth daily.     [name] docusate sodium (COLACE) 100 MG capsule Take 1 capsule (100 mg total) by mouth 2 (two) times daily. 10 capsule 0  . esomeprazole (NEXIUM) 40 MG capsule Take 40 mg by mouth daily as needed (for acid reflux).     . feeding supplement, ENSURE ENLIVE, (ENSURE ENLIVE) LIQD Take 237 mLs by mouth 2 (two) times daily between meals. 237 mL 12  . fluticasone-salmeterol (ADVAIR HFA) 115-21 MCG/ACT inhaler Inhale 2 puffs into the lungs 2 (two) times daily.     . furosemide (LASIX) 40 MG tablet Take 20-40 mg by mouth 2 (two) times daily. Take 40 mg by mouth in the morning and take 20 mg by mouth in the evening for fluid.    [name] glimepiride (AMARYL) 1 MG tablet Take 1 mg by mouth daily with breakfast.    . HYDROcodone-acetaminophen (NORCO) 5-325 MG tablet Take 1 tablet by mouth every 6 (six) hours as needed. 60 tablet 0  . Ipratropium-Albuterol (COMBIVENT) 20-100 MCG/ACT AERS respimat Inhale 1 puff into the lungs every 6 (six) hours as needed for wheezing or shortness of breath.     [name] ipratropium-albuterol (DUONEB) 0.5-2.5 (3) MG/3ML SOLN Take 3 mLs by nebulization every 6  (six) hours as needed (for shortness of breath).     [name] levothyroxine (SYNTHROID, LEVOTHROID) 100 MCG tablet Take 100 mcg by mouth daily before breakfast.     . metoprolol succinate (TOPROL-XL) 50 MG 24 hr tablet Take 1 tablet (50 mg total) by mouth every evening. 90 tablet 3  . ondansetron (ZOFRAN) 4 MG tablet Take 1 tablet (4 mg total) by mouth every 6 (six) hours as needed for nausea. 20 tablet 0  . potassium chloride SA (K-DUR,KLOR-CON) 20 MEQ tablet Take 20 mEq by mouth daily.     . prochlorperazine (COMPAZINE) 10 MG tablet Take 1 tablet (10 mg total) by mouth every 6 (six) hours as needed for nausea or vomiting. 30 tablet 0  . rOPINIRole (REQUIP) 4 MG tablet Take 4 mg by mouth at bedtime.    . senna-docusate (SENOKOT-S) 8.6-50 MG tablet Take 1 tablet by mouth at bedtime as needed for mild constipation.    . vitamin C (ASCORBIC ACID) 500 MG tablet Take 500 mg by mouth daily.    . Wound Cleansers (RADIAPLEX EX) Apply 1 application topically daily as needed (rain).      No current facility-administered medications for this encounter.     ALLERGIES: Coumadin and Prozac   LABORATORY DATA:  Lab Results  Component Value Date   WBC 8.0 01/12/2015   HGB 11.1* 01/12/2015   HCT 33.5* 01/12/2015   MCV 87.4 01/12/2015   PLT 236 01/12/2015   Lab Results  Component Value Date   NA 142 01/12/2015   K 3.5 01/12/2015   CL 93* 12/29/2014   CO2 27 01/12/2015   Lab Results  Component Value Date   ALT 15 01/12/2015   AST 17 01/12/2015   ALKPHOS 255* 01/12/2015   BILITOT 0.78 01/12/2015     NARRATIVE: Marissa [name] was seen today for weekly treatment management. The chart was checked and the patient's films were reviewed.  Vitals stable. Unable to stand for weight.  Denies pain at this time. Reports a productive cough with clear sputum. Denies hemoptysis. Denies difficulty swallowing or sore throat. Hyperpigmentation without desquamation of right upper back. Reports using radiaplex as  directed. Participating in physical therapy at rehab facility in the hopes of returning home soon.   BP 145/61 mmHg  Pulse 88  Resp 20  SpO2 97% Wt Readings from Last 3 Encounters:  12/27/14 139 lb (63.05 kg)  12/25/14 139 lb 12.8 oz (63.413 kg)  12/22/14 140 lb 6.4 oz (63.685 kg)     PHYSICAL EXAMINATION: blood pressure is 145/61 and her pulse is 88. Her respiration is 20 and oxygen saturation is 97%.        ASSESSMENT: The patient is doing satisfactorily with treatment.  PLAN: We will continue with the patient's radiation treatment as planned.

## 2015-01-16 NOTE — Progress Notes (Signed)
Vitals stable. Unable to stand for weight.  Denies pain at this time. Reports a productive cough with clear sputum. Denies hemoptysis. Denies difficulty swallowing or sore throat. Hyperpigmentation without desquamation of right upper back. Reports using radiaplex as directed. Participating in physical therapy at rehab facility in the hopes of returning home soon.   BP 145/61 mmHg  Pulse 88  Resp 20  SpO2 97% Wt Readings from Last 3 Encounters:  12/27/14 139 lb (63.05 kg)  12/25/14 139 lb 12.8 oz (63.413 kg)  12/22/14 140 lb 6.4 oz (63.685 kg)

## 2015-01-19 ENCOUNTER — Other Ambulatory Visit (HOSPITAL_BASED_OUTPATIENT_CLINIC_OR_DEPARTMENT_OTHER): Payer: Medicare Other

## 2015-01-19 ENCOUNTER — Ambulatory Visit (HOSPITAL_BASED_OUTPATIENT_CLINIC_OR_DEPARTMENT_OTHER): Payer: Medicare Other

## 2015-01-19 ENCOUNTER — Telehealth: Payer: Self-pay | Admitting: Internal Medicine

## 2015-01-19 ENCOUNTER — Ambulatory Visit (HOSPITAL_BASED_OUTPATIENT_CLINIC_OR_DEPARTMENT_OTHER): Payer: Medicare Other | Admitting: Internal Medicine

## 2015-01-19 ENCOUNTER — Encounter: Payer: Self-pay | Admitting: Internal Medicine

## 2015-01-19 ENCOUNTER — Ambulatory Visit
Admission: RE | Admit: 2015-01-19 | Discharge: 2015-01-19 | Disposition: A | Discharge status: Home / Self Care | Payer: Medicare Other | Source: Ambulatory Visit | Attending: Radiation Oncology | Admitting: Radiation Oncology

## 2015-01-19 VITALS — BP 113/42 | HR 76 | Temp 98.5°F | Resp 20 | Ht 61.0 in | Wt 141.2 lb

## 2015-01-19 DIAGNOSIS — C3411 Malignant neoplasm of upper lobe, right bronchus or lung: Secondary | ICD-10-CM

## 2015-01-19 DIAGNOSIS — Z5111 Encounter for antineoplastic chemotherapy: Secondary | ICD-10-CM

## 2015-01-19 LAB — CBC WITH DIFFERENTIAL/PLATELET
BASO%: 0.7 % (ref 0.0–2.0)
Basophils Absolute: 0 10*3/uL (ref 0.0–0.1)
EOS%: 1.8 % (ref 0.0–7.0)
Eosinophils Absolute: 0.1 10*3/uL (ref 0.0–0.5)
HEMATOCRIT: 30.7 % — AB (ref 34.8–46.6)
HGB: 9.9 g/dL — ABNORMAL LOW (ref 11.6–15.9)
LYMPH%: 16.5 % (ref 14.0–49.7)
MCH: 28.8 pg (ref 25.1–34.0)
MCHC: 32.2 g/dL (ref 31.5–36.0)
MCV: 89.2 fL (ref 79.5–101.0)
MONO#: 0.5 10*3/uL (ref 0.1–0.9)
MONO%: 9.9 % (ref 0.0–14.0)
NEUT%: 71.1 % (ref 38.4–76.8)
NEUTROS ABS: 3.2 10*3/uL (ref 1.5–6.5)
Platelets: 121 10*3/uL — ABNORMAL LOW (ref 145–400)
RBC: 3.44 10*6/uL — ABNORMAL LOW (ref 3.70–5.45)
RDW: 22 % — ABNORMAL HIGH (ref 11.2–14.5)
WBC: 4.6 10*3/uL (ref 3.9–10.3)
lymph#: 0.8 10*3/uL — ABNORMAL LOW (ref 0.9–3.3)
nRBC: 0 % (ref 0–0)

## 2015-01-19 LAB — COMPREHENSIVE METABOLIC PANEL (CC13)
ALT: 18 U/L (ref 0–55)
AST: 19 U/L (ref 5–34)
Albumin: 3.4 g/dL — ABNORMAL LOW (ref 3.5–5.0)
Alkaline Phosphatase: 206 U/L — ABNORMAL HIGH (ref 40–150)
Anion Gap: 9 mEq/L (ref 3–11)
BUN: 9.4 mg/dL (ref 7.0–26.0)
CHLORIDE: 101 meq/L (ref 98–109)
CO2: 28 meq/L (ref 22–29)
CREATININE: 0.7 mg/dL (ref 0.6–1.1)
Calcium: 9.9 mg/dL (ref 8.4–10.4)
EGFR: 77 mL/min/{1.73_m2} — ABNORMAL LOW (ref >90)
GLUCOSE: 106 mg/dL (ref 70–140)
POTASSIUM: 3.2 meq/L — AB (ref 3.5–5.1)
SODIUM: 139 meq/L (ref 136–145)
Total Bilirubin: 0.69 mg/dL (ref 0.20–1.20)
Total Protein: 6.3 g/dL — ABNORMAL LOW (ref 6.4–8.3)

## 2015-01-19 MED ORDER — SODIUM CHLORIDE 0.9 % IV SOLN
142.4000 mg | Freq: Once | INTRAVENOUS | Status: AC
  Administered 2015-01-19: 140 mg via INTRAVENOUS
  Filled 2015-01-19: qty 14

## 2015-01-19 MED ORDER — PACLITAXEL CHEMO INJECTION 300 MG/50ML
45.0000 mg/m2 | Freq: Once | INTRAVENOUS | Status: AC
  Administered 2015-01-19: 72 mg via INTRAVENOUS
  Filled 2015-01-19: qty 12

## 2015-01-19 MED ORDER — SODIUM CHLORIDE 0.9 % IV SOLN
Freq: Once | INTRAVENOUS | Status: AC
  Administered 2015-01-19: 13:00:00 via INTRAVENOUS
  Filled 2015-01-19: qty 8

## 2015-01-19 MED ORDER — FAMOTIDINE IN NACL 20-0.9 MG/50ML-% IV SOLN
INTRAVENOUS | Status: AC
  Filled 2015-01-19: qty 50

## 2015-01-19 MED ORDER — HEPARIN SOD (PORK) LOCK FLUSH 100 UNIT/ML IV SOLN
500.0000 [IU] | Freq: Once | INTRAVENOUS | Status: DC | PRN
  Filled 2015-01-19: qty 5

## 2015-01-19 MED ORDER — FAMOTIDINE IN NACL 20-0.9 MG/50ML-% IV SOLN
20.0000 mg | Freq: Once | INTRAVENOUS | Status: AC
Start: 2015-01-19 — End: 2015-01-19
  Administered 2015-01-19: 20 mg via INTRAVENOUS

## 2015-01-19 MED ORDER — SODIUM CHLORIDE 0.9 % IV SOLN
Freq: Once | INTRAVENOUS | Status: AC
  Administered 2015-01-19: 13:00:00 via INTRAVENOUS

## 2015-01-19 MED ORDER — SODIUM CHLORIDE 0.9 % IJ SOLN
10.0000 mL | INTRAMUSCULAR | Status: DC | PRN
  Filled 2015-01-19: qty 10

## 2015-01-19 MED ORDER — DIPHENHYDRAMINE HCL 50 MG/ML IJ SOLN
50.0000 mg | Freq: Once | INTRAMUSCULAR | Status: AC
  Administered 2015-01-19: 50 mg via INTRAVENOUS

## 2015-01-19 MED ORDER — DIPHENHYDRAMINE HCL 50 MG/ML IJ SOLN
INTRAMUSCULAR | Status: AC
  Filled 2015-01-19: qty 1

## 2015-01-19 NOTE — Telephone Encounter (Signed)
per pof to sch pt appt-gave pt copy of avs-adv [facility] will call to [facility] scan

## 2015-01-19 NOTE — Patient Instructions (Addendum)
[facility] Discharge Instructions for Patients Receiving Chemotherapy  Today you received the following chemotherapy agents Taxol/Carboplatin To help prevent nausea and vomiting after your treatment, we encourage you to take your nausea medication as prescribed.   If you develop nausea and vomiting that is not controlled by your nausea medication, call the clinic.   BELOW ARE SYMPTOMS THAT SHOULD BE REPORTED IMMEDIATELY:  *FEVER GREATER THAN 100.5 F  *CHILLS WITH OR WITHOUT FEVER  NAUSEA AND VOMITING THAT IS NOT CONTROLLED WITH YOUR NAUSEA MEDICATION  *UNUSUAL SHORTNESS OF BREATH  *UNUSUAL BRUISING OR BLEEDING  TENDERNESS IN MOUTH AND THROAT WITH OR WITHOUT PRESENCE OF ULCERS  *URINARY PROBLEMS  *BOWEL PROBLEMS  UNUSUAL RASH Items with * indicate a potential emergency and should be followed up as soon as possible.  Feel free to call the clinic you have any questions or concerns. The clinic phone number is [phone].  Please show the CHEMO ALERT CARD at check-in to the Emergency Department and triage nurse.   

## 2015-01-19 NOTE — Progress Notes (Signed)
[facility] [phone]   [phone]  OFFICE PROGRESS NOTE  [name], MD [address] [place] [address]  DIAGNOSIS: Stage IIIA (T2a, N2, M0) non-small cell lung cancer, squamous cell carcinoma presented with recurrence on the right upper lobe in addition to mediastinal lymphadenopathy diagnosed in September 2016.  PRIOR THERAPY:None  CURRENT THERAPY: Concurrent chemoradiation with weekly carboplatin for AUC of 2 and paclitaxel 45 MG/M2. S/P 5 cycles.  INTERVAL HISTORY: Marissa [name] 81 y.o. female returns to the clinic today for follow-up visit accompanied by her daughter. The patient is feeling fine today was no specific complaints. She is tolerating her course of concurrent chemoradiation fairly well with no significant adverse effects. She has a conflict with her daughter about going home from the skilled nursing facility. The patient wants to go home but her daughter feels that it is still premature for her to leave the facility. They will discuss this with the facility physician. She denied having any significant dysphagia or odynophagia. She denied having any significant nausea or vomiting. She has no fever or chills. She denied having any significant weight loss or night sweats. She is here today to start cycle #6 of her chemotherapy.  MEDICAL HISTORY: Past Medical History  Diagnosis Date  . HTN (hypertension)   . Hypercholesteremia   . CAD (coronary artery disease)     s/p CABG 03/2011  . Aortic valve disorders   . RLS (restless legs syndrome)   . A-fib ([place])   . Osteopenia   . Mitral valve disease     s/p MVR 03/2011 w/ 27 mm porcine valve  . Hypothyroidism   . COPD (chronic obstructive pulmonary disease) ([place])   . CHF (congestive heart failure) ([place])   . Aortic stenosis     (s/p AVR w/ 21 mm porcine valve 03/2011  . Mastoiditis   . Myocardial infarction ([place])     "slight"  . Dysrhythmia   .  Shortness of breath     with exertion  . Pre-diabetes   . GERD (gastroesophageal reflux disease)     takes nexium prn  . Arthritis   . History of blood transfusion     Rheumatic Fever  . Lung cancer ([place])   . Pneumonia   . Diabetes mellitus without complication ([place])   . Full code status 12/22/2014  . Full code status 12/22/2014    ALLERGIES:  is allergic to coumadin and prozac.  MEDICATIONS:  Current Outpatient Prescriptions  Medication Sig Dispense Refill  . alendronate (FOSAMAX) 70 MG tablet Take 70 mg by mouth once a week.     [name] aspirin EC 325 MG tablet Take 1 tablet (325 mg total) by mouth 2 (two) times daily. 30 tablet 0  . atorvastatin (LIPITOR) 40 MG tablet Take 40 mg by mouth daily.     [name] CALCIUM CARBONATE PO Take 1 tablet by mouth 2 (two) times daily.    . digoxin (LANOXIN) 0.125 MG tablet Take 0.125 mg by mouth daily.     [name] docusate sodium (COLACE) 100 MG capsule Take 1 capsule (100 mg total) by mouth 2 (two) times daily. 10 capsule 0  . esomeprazole (NEXIUM) 40 MG capsule Take 40 mg by mouth daily as needed (for acid reflux).     . feeding supplement, ENSURE ENLIVE, (ENSURE ENLIVE) LIQD Take 237 mLs by mouth 2 (two) times daily between meals. 237 mL 12  . fluticasone-salmeterol (ADVAIR HFA) 115-21  MCG/ACT inhaler Inhale 2 puffs into the lungs 2 (two) times daily.     . furosemide (LASIX) 40 MG tablet Take 20-40 mg by mouth 2 (two) times daily. Take 40 mg by mouth in the morning and take 20 mg by mouth in the evening for fluid.    [name] glimepiride (AMARYL) 1 MG tablet Take 1 mg by mouth daily with breakfast.    . Ipratropium-Albuterol (COMBIVENT) 20-100 MCG/ACT AERS respimat Inhale 1 puff into the lungs every 6 (six) hours as needed for wheezing or shortness of breath.     [name] ipratropium-albuterol (DUONEB) 0.5-2.5 (3) MG/3ML SOLN Take 3 mLs by nebulization every 6 (six) hours as needed (for shortness of breath).     [name] levothyroxine (SYNTHROID, LEVOTHROID) 100 MCG tablet Take 100  mcg by mouth daily before breakfast.     . metoprolol succinate (TOPROL-XL) 50 MG 24 hr tablet Take 1 tablet (50 mg total) by mouth every evening. 90 tablet 3  . potassium chloride SA (K-DUR,KLOR-CON) 20 MEQ tablet Take 20 mEq by mouth daily.     [name] rOPINIRole (REQUIP) 4 MG tablet Take 4 mg by mouth at bedtime.    . vitamin C (ASCORBIC ACID) 500 MG tablet Take 500 mg by mouth daily.    . Wound Cleansers (RADIAPLEX EX) Apply 1 application topically daily as needed (rain).     [name] acetaminophen (TYLENOL) 325 MG tablet Take 2 tablets (650 mg total) by mouth every 6 (six) hours as needed for mild pain (or Fever >/= 101). (Patient not taking: Reported on 01/19/2015)    . HYDROcodone-acetaminophen (NORCO) 5-325 MG tablet Take 1 tablet by mouth every 6 (six) hours as needed. 60 tablet 0  . ondansetron (ZOFRAN) 4 MG tablet Take 1 tablet (4 mg total) by mouth every 6 (six) hours as needed for nausea. (Patient not taking: Reported on 01/19/2015) 20 tablet 0  . prochlorperazine (COMPAZINE) 10 MG tablet Take 1 tablet (10 mg total) by mouth every 6 (six) hours as needed for nausea or vomiting. (Patient not taking: Reported on 01/19/2015) 30 tablet 0  . senna-docusate (SENOKOT-S) 8.6-50 MG tablet Take 1 tablet by mouth at bedtime as needed for mild constipation. (Patient not taking: Reported on 01/19/2015)     No current facility-administered medications for this visit.    SURGICAL HISTORY:  Past Surgical History  Procedure Laterality Date  . Mitral valve replacement      1990's  . Laparoscopic cholecystectomy    . Aortic valve replacement    . Tonsillectomy    . Coronary artery bypass graft  03/2011    1990's also  . Video bronchoscopy with endobronchial navigation N/A 01/06/2014    Procedure: VIDEO BRONCHOSCOPY WITH ENDOBRONCHIAL NAVIGATION;  Surgeon: [name], MD;  Location: [facility];  Service: Thoracic;  Laterality: N/A;  . Video bronchoscopy with endobronchial ultrasound N/A 01/06/2014     Procedure: VIDEO BRONCHOSCOPY WITH ENDOBRONCHIAL ULTRASOUND;  Surgeon: [name], MD;  Location: [facility];  Service: Thoracic;  Laterality: N/A;  . Video bronchoscopy with endobronchial navigation N/A 11/21/2014    Procedure: VIDEO BRONCHOSCOPY WITH ENDOBRONCHIAL NAVIGATION;  Surgeon: [name], MD;  Location: [facility];  Service: Thoracic;  Laterality: N/A;  . Video bronchoscopy with endobronchial ultrasound N/A 11/21/2014    Procedure: VIDEO BRONCHOSCOPY WITH ENDOBRONCHIAL ULTRASOUND;  Surgeon: [name], MD;  Location: [facility];  Service: Thoracic;  Laterality: N/A;  . Compression hip screw Left 12/26/2014    Procedure: ORIF LEFT HIP  INTERTROCHANTRIC FRACTURE;  Surgeon: Frederik Pear, MD;  Location: [facility];  Service: Orthopedics;  Laterality: Left;    REVIEW OF SYSTEMS:  A comprehensive review of systems was negative except for: Musculoskeletal: positive for muscle weakness   PHYSICAL EXAMINATION: General appearance: alert, cooperative, fatigued and no distress Head: Normocephalic, without obvious abnormality, atraumatic Neck: no adenopathy, no JVD, supple, symmetrical, trachea midline and thyroid not enlarged, symmetric, no tenderness/mass/nodules Lymph nodes: Cervical, supraclavicular, and axillary nodes normal. Resp: clear to auscultation bilaterally Back: symmetric, no curvature. ROM normal. No CVA tenderness. Cardio: regular rate and rhythm, S1, S2 normal, no murmur, click, rub or gallop GI: soft, non-tender; bowel sounds normal; no masses,  no organomegaly Extremities: extremities normal, atraumatic, no cyanosis or edema  ECOG PERFORMANCE STATUS: 1 - Symptomatic but completely ambulatory  Blood pressure 113/42, pulse 76, temperature 98.5 F (36.9 C), temperature source Oral, resp. rate 20, height '5\' 1"'$  (1.549 m), weight 141 lb 3.2 oz (64.048 kg), SpO2 97 %.  LABORATORY DATA: Lab Results  Component Value Date   WBC 4.6 01/19/2015   HGB 9.9* 01/19/2015   HCT 30.7*  01/19/2015   MCV 89.2 01/19/2015   PLT 121* 01/19/2015      Chemistry      Component Value Date/Time   NA 139 01/19/2015 0906   NA 133* 12/29/2014 0010   NA 137 04/17/2014 0409   K 3.2* 01/19/2015 0906   K 4.4 12/29/2014 0010   K 2.8* 04/18/2014 0422   CL 93* 12/29/2014 0010   CL 98 04/17/2014 0409   CO2 28 01/19/2015 0906   CO2 31 12/29/2014 0010   CO2 30 04/17/2014 0409   BUN 9.4 01/19/2015 0906   BUN 17 12/29/2014 0010   BUN 19* 04/17/2014 0409   CREATININE 0.7 01/19/2015 0906   CREATININE 0.84 12/29/2014 0010   CREATININE 1.08 04/17/2014 0409      Component Value Date/Time   CALCIUM 9.9 01/19/2015 0906   CALCIUM 8.3* 12/29/2014 0010   CALCIUM 9.0 04/17/2014 0409   ALKPHOS 206* 01/19/2015 0906   ALKPHOS 108 12/26/2014 1450   AST 19 01/19/2015 0906   AST 159* 12/26/2014 1450   ALT 18 01/19/2015 0906   ALT 170* 12/26/2014 1450   BILITOT 0.69 01/19/2015 0906   BILITOT 1.0 12/26/2014 1450       RADIOGRAPHIC STUDIES: Dg Chest 1 View  12/25/2014  CLINICAL DATA:  LEFT hip fracture. EXAM: CHEST 1 VIEW COMPARISON:  Chest radiograph November 21, 2014 and CT chest November 20, 2014. FINDINGS: Masslike consolidation RIGHT upper lobe, decreased from prior study. Similar pulmonary vascular congestion and fullness of the RIGHT hilum corresponding to lymphadenopathy on recent CT chest. The cardiac silhouette is mildly enlarged. Status post median sternotomy for cardiac valve replacement and CABG. Similar LEFT lung base atelectasis/ scarring. No pleural effusion. No pneumothorax. Partially imaged screw in RIGHT humeral head. IMPRESSION: Improving RIGHT upper lobe consolidation/mass, recommend continued surveillance. Mild cardiomegaly, pulmonary vascular congestion. Electronically Signed   By: Elon Alas M.D.   On: 12/25/2014 22:42   Mr Jeri Cos NU Contrast  12/31/2014  CLINICAL DATA:  Right upper lobe lung cancer.  Staging. EXAM: MRI HEAD WITHOUT AND WITH CONTRAST TECHNIQUE:  Multiplanar, multiecho pulse sequences of the brain and surrounding structures were obtained without and with intravenous contrast. CONTRAST:  76m MULTIHANCE GADOBENATE DIMEGLUMINE 529 MG/ML IV SOLN COMPARISON:  None. FINDINGS: There is no evidence of acute infarct, mass, midline shift, or extra-axial fluid collection. A focus of chronic microhemorrhage  is noted in the left parietal operculum. There is moderate generalized cerebral atrophy. Small foci of T2 hyperintensity in the subcortical and deep cerebral white matter are nonspecific but compatible with mild chronic small vessel ischemic disease. Focal transcortical encephalomalacia is noted in the inferior right temporal lobe. No abnormal enhancement is identified. Prior bilateral cataract extraction is noted. Paranasal sinuses are clear. There is a small right mastoid effusion. Major intracranial vascular flow voids are preserved. IMPRESSION: 1. No evidence of intracranial metastases. 2. Mild chronic small vessel ischemic disease and moderate cerebral atrophy. 3. Focal encephalomalacia in the right temporal lobe which may reflect remote trauma or ischemia. Electronically Signed   By: Logan Bores M.D.   On: 12/31/2014 10:07   Pelvis Portable  12/26/2014  CLINICAL DATA:  ORIF left hip. EXAM: PORTABLE PELVIS 1-2 VIEWS COMPARISON:  PET-CT 11/10/2014. FINDINGS: ORIF left hip with good anatomic alignment on AP view. Hardware intact. Surgical clips in the pelvis. IMPRESSION: ORIF left hip with good anatomic alignment on AP view. Electronically Signed   By: Marcello Moores  Register   On: 12/26/2014 12:44   Dg Hip Operative Unilat With Pelvis Left  12/26/2014  CLINICAL DATA:  Intraoperative fluoro spot images from ORIF of the left hip EXAM: OPERATIVE left HIP (WITH PELVIS IF PERFORMED) 2 VIEWS TECHNIQUE: Fluoroscopic spot image(s) were submitted for interpretation post-operatively. Fluoro time reported is 1 minutes, 4 seconds COMPARISON:  Preoperative left hip series of  December 25, 2014 FINDINGS: The patient has undergone telescoping nail and sideplate fixation of an intertrochanteric fracture. Alignment of the fracture fragments is more near anatomic. There is avulsion of the lesser trochanter. IMPRESSION: The patient has undergone ORIF for an intertrochanteric fracture of the left hip. There is no immediate postprocedure complication. Electronically Signed   By: David  Martinique M.D.   On: 12/26/2014 11:43   Dg Hip Unilat With Pelvis 2-3 Views Left  12/25/2014  CLINICAL DATA:  Fall EXAM: DG HIP (WITH OR WITHOUT PELVIS) 2-3V LEFT COMPARISON:  None. FINDINGS: Comminuted intertrochanteric left femur fracture. Lesser trochanter is displaced. Greater trochanter is also displaced. Slight varus deformity. Postop changes in the pelvis. Advanced lumbar spine degenerative change. IMPRESSION: Comminuted left intertrochanteric femur fracture. Electronically Signed   By: Marybelle Killings M.D.   On: 12/25/2014 22:39    ASSESSMENT AND PLAN: This is a very pleasant 81 years old white female with recurrent non-small cell lung cancer presented as a stage IIIa, squamous cell carcinoma. The patient is currently undergoing a course of concurrent chemoradiation with weekly carboplatin and paclitaxel. She status post 5 cycles. She is tolerating her treatment well. I recommended for her to proceed with cycle #6 today as a scheduled.  The patient would come back for follow-up visit in 6 weeks for evaluation after repeating CT scan of the chest for restaging of her disease. The patient was advised to call immediately if she has any concerning symptoms in the interval. The patient voices understanding of current disease status and treatment options and is in agreement with the current care plan.  All questions were answered. The patient knows to call the clinic with any problems, questions or concerns. We can certainly see the patient much sooner if necessary.  Disclaimer: This note was dictated with  voice recognition software. Similar sounding words can inadvertently be transcribed and may not be corrected upon review.

## 2015-01-20 ENCOUNTER — Ambulatory Visit
Admission: RE | Admit: 2015-01-20 | Discharge: 2015-01-20 | Disposition: A | Discharge status: Home / Self Care | Payer: Medicare Other | Source: Ambulatory Visit | Attending: Radiation Oncology | Admitting: Radiation Oncology

## 2015-01-20 DIAGNOSIS — R0602 Shortness of breath: Secondary | ICD-10-CM | POA: Diagnosis not present

## 2015-01-20 DIAGNOSIS — C3411 Malignant neoplasm of upper lobe, right bronchus or lung: Secondary | ICD-10-CM | POA: Diagnosis not present

## 2015-01-20 DIAGNOSIS — R2681 Unsteadiness on feet: Secondary | ICD-10-CM | POA: Diagnosis not present

## 2015-01-21 ENCOUNTER — Ambulatory Visit
Admit: 2015-01-21 | Discharge: 2015-01-21 | Disposition: A | Discharge status: Home / Self Care | Payer: Medicare Other | Attending: Radiation Oncology | Admitting: Radiation Oncology

## 2015-01-21 ENCOUNTER — Ambulatory Visit: Payer: Medicare Other

## 2015-01-21 DIAGNOSIS — R300 Dysuria: Secondary | ICD-10-CM | POA: Diagnosis not present

## 2015-01-21 DIAGNOSIS — C3411 Malignant neoplasm of upper lobe, right bronchus or lung: Secondary | ICD-10-CM | POA: Diagnosis not present

## 2015-01-22 ENCOUNTER — Ambulatory Visit
Admit: 2015-01-22 | Discharge: 2015-01-22 | Disposition: A | Discharge status: Home / Self Care | Payer: Medicare Other | Attending: Radiation Oncology | Admitting: Radiation Oncology

## 2015-01-22 ENCOUNTER — Ambulatory Visit
Admission: RE | Admit: 2015-01-22 | Discharge: 2015-01-22 | Disposition: A | Discharge status: Home / Self Care | Payer: Medicare Other | Source: Ambulatory Visit | Attending: Radiation Oncology | Admitting: Radiation Oncology

## 2015-01-22 ENCOUNTER — Ambulatory Visit: Payer: Medicare Other

## 2015-01-22 ENCOUNTER — Telehealth: Payer: Self-pay | Admitting: Radiation Oncology

## 2015-01-22 DIAGNOSIS — R319 Hematuria, unspecified: Secondary | ICD-10-CM

## 2015-01-22 DIAGNOSIS — C3411 Malignant neoplasm of upper lobe, right bronchus or lung: Secondary | ICD-10-CM | POA: Diagnosis not present

## 2015-01-22 LAB — URINALYSIS, MICROSCOPIC - CHCC
Bilirubin (Urine): NEGATIVE
GLUCOSE UR CHCC: NEGATIVE mg/dL
Ketones: NEGATIVE mg/dL
Nitrite: POSITIVE
PROTEIN: 30 mg/dL
SPECIFIC GRAVITY, URINE: 1.015 (ref 1.003–1.035)
UROBILINOGEN UR: 0.2 mg/dL (ref 0.2–1)
pH: 6 (ref 4.6–8.0)

## 2015-01-22 NOTE — Telephone Encounter (Signed)
Per Dr. Tammi Klippel order gave a verbal order to Rollene Fare, nurse manager at [facility] to empirically treat the patient's UTI with Cipro 500 mg bid. Explained once the culture was in this may change. Understand the patient will be discharged from [facility]. Once culture returns if it isn't sensitive to cipro a different antibiotic will be called to the patient's [place]. Patient's daughter informed of the same. All verbalized understanding.

## 2015-01-23 ENCOUNTER — Non-Acute Institutional Stay (SKILLED_NURSING_FACILITY): Payer: Medicare Other | Admitting: Nurse Practitioner

## 2015-01-23 ENCOUNTER — Ambulatory Visit
Admission: RE | Admit: 2015-01-23 | Discharge: 2015-01-23 | Disposition: A | Discharge status: Home / Self Care | Payer: Medicare Other | Source: Ambulatory Visit | Attending: Radiation Oncology | Admitting: Radiation Oncology

## 2015-01-23 ENCOUNTER — Encounter: Payer: Self-pay | Admitting: Radiation Oncology

## 2015-01-23 ENCOUNTER — Ambulatory Visit
Admit: 2015-01-23 | Discharge: 2015-01-23 | Disposition: A | Discharge status: Home / Self Care | Payer: Medicare Other | Attending: Radiation Oncology | Admitting: Radiation Oncology

## 2015-01-23 ENCOUNTER — Encounter: Payer: Self-pay | Admitting: Nurse Practitioner

## 2015-01-23 ENCOUNTER — Ambulatory Visit: Payer: Medicare Other

## 2015-01-23 VITALS — BP 157/67 | HR 60 | Resp 16

## 2015-01-23 DIAGNOSIS — I482 Chronic atrial fibrillation, unspecified: Secondary | ICD-10-CM

## 2015-01-23 DIAGNOSIS — E038 Other specified hypothyroidism: Secondary | ICD-10-CM | POA: Diagnosis not present

## 2015-01-23 DIAGNOSIS — I110 Hypertensive heart disease with heart failure: Secondary | ICD-10-CM | POA: Diagnosis not present

## 2015-01-23 DIAGNOSIS — D62 Acute posthemorrhagic anemia: Secondary | ICD-10-CM

## 2015-01-23 DIAGNOSIS — E034 Atrophy of thyroid (acquired): Secondary | ICD-10-CM

## 2015-01-23 DIAGNOSIS — S72002D Fracture of unspecified part of neck of left femur, subsequent encounter for closed fracture with routine healing: Secondary | ICD-10-CM | POA: Diagnosis not present

## 2015-01-23 DIAGNOSIS — C3411 Malignant neoplasm of upper lobe, right bronchus or lung: Secondary | ICD-10-CM

## 2015-01-23 DIAGNOSIS — N390 Urinary tract infection, site not specified: Secondary | ICD-10-CM | POA: Diagnosis not present

## 2015-01-23 DIAGNOSIS — I25810 Atherosclerosis of coronary artery bypass graft(s) without angina pectoris: Secondary | ICD-10-CM | POA: Diagnosis not present

## 2015-01-23 NOTE — Progress Notes (Signed)
  Radiation Oncology         [phone] ________________________________  Name: Marissa [name] MRN: [id number]  Date: 01/23/2015  DOB: 03-26-1933    Weekly Radiation Therapy Management    ICD-9-CM ICD-10-CM   1. Primary cancer of right upper lobe of lung (HCC) 162.3 C34.11     Current Dose: 66 Gy     Planned Dose:  66 Gy  Narrative . . . . . . . . The patient presents for the final under treatment assessment.                                           The patient has had some continuation of previously noted symptoms. Weight and vitals stable. Denies pain. Productive cough with clear to yellow sputum. Denies hemoptysis. Reports "some" difficulty swallowing. Denies a sore throat. Reports hyperpigmentation of upper back within treatment field. Will continue to use radiaplex bid for two weeks from today. Started Cipro 500 mg bid last night. Awaiting culture results. Patient discharged from [facility] today.                                  Set-up films were reviewed.                                 The chart was checked. Physical Findings. . . Weight essentially stable.  No significant changes. Impression . . . . . . . The patient tolerated radiation relatively well. Urinalysis is suggestive of urinary tract infection, we started the patient on emperic Cipro. Plan . . . . . . . . . . . . Completed radiation today as scheduled, and follow-up in one month. The patient was encouraged to call or return to the clinic in the interim for any worsening symptoms. Await urine culture results.   ________________________________  Sheral Apley Tammi Klippel, M.D.  This document serves as a record of services personally performed by Tyler Pita, MD. It was created on his behalf by Arlyce Harman, a trained medical scribe. The creation of this record is based on the scribe's personal observations and the provider's statements to them. This document has been checked and approved by the attending provider.

## 2015-01-23 NOTE — Progress Notes (Signed)
Nursing Home Location:  [facility]   Place of Service: SNF (31)  PCP: [name], MD  Allergies  Allergen Reactions  . Coumadin [Warfarin Sodium] Other (See Comments)    Reaction: Bleeding from ears  . Prozac [Fluoxetine Hcl] Rash    Chief Complaint  Patient presents with  . Discharge Note    HPI:  Patient is a 81 y.o. female seen today at [facility] for discharge home with daughter. Pt with hx of coronary artery disease, hypertension, dyslipidemia, uric stenosis, chronic atrial fibrillation who was hospitalized from 10/6-10/10 after having a fall at home. Pt underwent an ORIF without complications. Pt at [facility] for OT/PT. Patient currently doing well with therapy, now stable to discharge home with home health. Currently being treated for UTI.   Review of Systems:  Review of Systems  Constitutional: Negative for activity change, appetite change, fatigue and unexpected weight change.  HENT: Negative for congestion and hearing loss.   Eyes: Negative.   Respiratory: Positive for cough and shortness of breath.        Chronic O2 with cough no worsening of symptoms  Cardiovascular: Negative for chest pain, palpitations and leg swelling.  Gastrointestinal: Negative for abdominal pain, diarrhea and constipation.  Genitourinary: Negative for dysuria and difficulty urinating.  Musculoskeletal: Negative for myalgias and arthralgias.       Denies pain at this time  Skin: Negative for color change and wound.  Neurological: Negative for dizziness and weakness.  Psychiatric/Behavioral: Negative for behavioral problems, confusion and agitation.    Past Medical History  Diagnosis Date  . HTN (hypertension)   . Hypercholesteremia   . CAD (coronary artery disease)     s/p CABG 03/2011  . Aortic valve disorders   . RLS (restless legs syndrome)   . A-fib ([place])   . Osteopenia   . Mitral valve disease     s/p MVR 03/2011 w/ 27 mm porcine valve   . Hypothyroidism   . COPD (chronic obstructive pulmonary disease) ([place])   . CHF (congestive heart failure) ([place])   . Aortic stenosis     (s/p AVR w/ 21 mm porcine valve 03/2011  . Mastoiditis   . Myocardial infarction ([place])     "slight"  . Dysrhythmia   . Shortness of breath     with exertion  . Pre-diabetes   . GERD (gastroesophageal reflux disease)     takes nexium prn  . Arthritis   . History of blood transfusion     Rheumatic Fever  . Lung cancer ([place])   . Pneumonia   . Diabetes mellitus without complication ([place])   . Full code status 12/22/2014  . Full code status 12/22/2014   Past Surgical History  Procedure Laterality Date  . Mitral valve replacement      1990's  . Laparoscopic cholecystectomy    . Aortic valve replacement    . Tonsillectomy    . Coronary artery bypass graft  03/2011    1990's also  . Video bronchoscopy with endobronchial navigation N/A 01/06/2014    Procedure: VIDEO BRONCHOSCOPY WITH ENDOBRONCHIAL NAVIGATION;  Surgeon: [name], MD;  Location: [place];  Service: Thoracic;  Laterality: N/A;  . Video bronchoscopy with endobronchial ultrasound N/A 01/06/2014    Procedure: VIDEO BRONCHOSCOPY WITH ENDOBRONCHIAL ULTRASOUND;  Surgeon: [name], MD;  Location: [place];  Service: Thoracic;  Laterality: N/A;  . Video bronchoscopy with endobronchial navigation N/A 11/21/2014    Procedure: VIDEO  BRONCHOSCOPY WITH ENDOBRONCHIAL NAVIGATION;  Surgeon: [name], MD;  Location: [place];  Service: Thoracic;  Laterality: N/A;  . Video bronchoscopy with endobronchial ultrasound N/A 11/21/2014    Procedure: VIDEO BRONCHOSCOPY WITH ENDOBRONCHIAL ULTRASOUND;  Surgeon: [name], MD;  Location: [place];  Service: Thoracic;  Laterality: N/A;  . Compression hip screw Left 12/26/2014    Procedure: ORIF LEFT HIP INTERTROCHANTRIC FRACTURE;  Surgeon: Frederik Pear, MD;  Location: [place];  Service: Orthopedics;  Laterality: Left;   Social History:    reports that she has quit smoking. Her smoking use included Cigarettes. She has a 15 pack-year smoking history. She has never used smokeless tobacco. She reports that she does not drink alcohol or use illicit drugs.  Family History  Problem Relation Age of Onset  . Adopted: Yes  . Cancer Brother     lung  . Heart attack Neg Hx   . Stroke Neg Hx     Medications: Patient's Medications  New Prescriptions   No medications on file  Previous Medications   ACETAMINOPHEN (TYLENOL) 325 MG TABLET    Take 2 tablets (650 mg total) by mouth every 6 (six) hours as needed for mild pain (or Fever >/= 101).   ALENDRONATE (FOSAMAX) 70 MG TABLET    Take 70 mg by mouth once a week.    ASPIRIN EC 325 MG TABLET    Take 1 tablet (325 mg total) by mouth 2 (two) times daily.   ATORVASTATIN (LIPITOR) 40 MG TABLET    Take 40 mg by mouth daily. For HLD   CALCIUM CARBONATE PO    Take 1 tablet by mouth 2 (two) times daily.   DIGOXIN (LANOXIN) 0.125 MG TABLET    Take 0.125 mg by mouth daily.    DOCUSATE SODIUM (COLACE) 100 MG CAPSULE    Take 1 capsule (100 mg total) by mouth 2 (two) times daily.   ESOMEPRAZOLE (NEXIUM) 40 MG CAPSULE    Take 40 mg by mouth daily as needed (for acid reflux).    FLUTICASONE-SALMETEROL (ADVAIR HFA) 115-21 MCG/ACT INHALER    Inhale 2 puffs into the lungs 2 (two) times daily.    FUROSEMIDE (LASIX) 40 MG TABLET    Take 20-40 mg by mouth 2 (two) times daily. Take 40 mg by mouth in the morning and take 20 mg by mouth in the evening for fluid.   GLIMEPIRIDE (AMARYL) 1 MG TABLET    Take 1 mg by mouth daily with breakfast.   HYDROCODONE-ACETAMINOPHEN (NORCO) 5-325 MG TABLET    Take 1 tablet by mouth every 6 (six) hours as needed.   IPRATROPIUM-ALBUTEROL (COMBIVENT) 20-100 MCG/ACT AERS RESPIMAT    Inhale 1 puff into the lungs every 6 (six) hours as needed for wheezing or shortness of breath.    IPRATROPIUM-ALBUTEROL (DUONEB) 0.5-2.5 (3) MG/3ML SOLN    Take 3 mLs by nebulization every 6 (six) hours  as needed (for shortness of breath).    LEVOTHYROXINE (SYNTHROID, LEVOTHROID) 100 MCG TABLET    Take 100 mcg by mouth daily before breakfast.    METOPROLOL SUCCINATE (TOPROL-XL) 50 MG 24 HR TABLET    Take 1 tablet (50 mg total) by mouth every evening.   ONDANSETRON (ZOFRAN) 4 MG TABLET    Take 1 tablet (4 mg total) by mouth every 6 (six) hours as needed for nausea.   POTASSIUM CHLORIDE SA (K-DUR,KLOR-CON) 20 MEQ TABLET    Take 20 mEq by mouth daily.    PROCHLORPERAZINE (COMPAZINE) 10  MG TABLET    Take 1 tablet (10 mg total) by mouth every 6 (six) hours as needed for nausea or vomiting.   ROPINIROLE (REQUIP) 4 MG TABLET    Take 4 mg by mouth at bedtime.   SENNA-DOCUSATE (SENOKOT-S) 8.6-50 MG TABLET    Take 1 tablet by mouth at bedtime as needed for mild constipation.   VITAMIN C (ASCORBIC ACID) 500 MG TABLET    Take 500 mg by mouth daily.   WOUND CLEANSERS (RADIAPLEX EX)    Apply 1 application topically daily as needed (rain).   Modified Medications   No medications on file  Discontinued Medications   FEEDING SUPPLEMENT, ENSURE ENLIVE, (ENSURE ENLIVE) LIQD    Take 237 mLs by mouth 2 (two) times daily between meals.     Physical Exam: Filed Vitals:   01/23/15 1456  BP: 127/89  Pulse: 52  Temp: 97.5 F (36.4 C)  TempSrc: Oral  Resp: 17  Weight: 142 lb (64.411 kg)    Physical Exam  Constitutional: She is oriented to person, place, and time. No distress.  Frail elderly female  HENT:  Head: Normocephalic and atraumatic.  Mouth/Throat: Oropharynx is clear and moist. No oropharyngeal exudate.  Eyes: Conjunctivae are normal. Pupils are equal, round, and reactive to light.  Neck: Normal range of motion. Neck supple.  Cardiovascular: Normal rate, regular rhythm and normal heart sounds.   Pulmonary/Chest: Effort normal and breath sounds normal.  Abdominal: Soft. Bowel sounds are normal.  Musculoskeletal: She exhibits no edema or tenderness.  Neurological: She is alert and oriented to  person, place, and time.  Skin: Skin is warm and dry. She is not diaphoretic.  Left hip incision, well healed  Psychiatric: She is agitated.    Labs reviewed: Basic Metabolic Panel:  Recent Labs  12/26/14 0153  12/27/14 0421 12/28/14 0910 12/29/14 0010 01/05/15 1118 01/12/15 0916 01/19/15 0906  NA  --   < > 135 131* 133* 139 142 139  K  --   < > 3.9 3.8 4.4 4.0 3.5 3.2*  CL  --   < > 95* 93* 93*  --   --   --   CO2  --   < > '29 29 31 '$ 30* 27 28  GLUCOSE  --   < > 153* 227* 145* 148* 162* 106  BUN  --   < > '14 14 17 '$ 16.8 11.3 9.4  CREATININE  --   < > 0.71 0.95 0.84 0.8 0.9 0.7  CALCIUM  --   < > 8.0* 7.9* 8.3* 9.4 9.8 9.9  MG 1.9  --   --   --  1.9  --   --   --   PHOS 3.1  --   --   --   --   --   --   --   < > = values in this interval not displayed. Liver Function Tests:  Recent Labs  01/05/15 1118 01/12/15 0916 01/19/15 0906  AST '20 17 19  '$ ALT '23 15 18  '$ ALKPHOS 125 255* 206*  BILITOT 0.98 0.78 0.69  PROT 6.4 6.6 6.3*  ALBUMIN 3.0* 3.4* 3.4*   No results for input(s): LIPASE, AMYLASE in the last 8760 hours. No results for input(s): AMMONIA in the last 8760 hours. CBC:  Recent Labs  01/05/15 1118 01/12/15 0916 01/19/15 0906  WBC 8.7 8.0 4.6  NEUTROABS 7.2* 6.4 3.2  HGB 9.9* 11.1* 9.9*  HCT 31.8* 33.5* 30.7*  MCV 89.6 87.4 89.2  PLT 222 236 121*   TSH:  Recent Labs  12/27/14 1710  TSH 1.307   A1C: Lab Results  Component Value Date   HGBA1C 7.5* 12/26/2014   Lipid Panel: No results for input(s): CHOL, HDL, LDLCALC, TRIG, CHOLHDL, LDLDIRECT in the last 8760 hours.    Assessment/Plan 1. Closed left hip fracture, with routine healing, subsequent encounter S/p ORIF. Pt reports no pain. Has completed ASA 325 mg BID for 1 month for DVT prophylaxis   2. Hypertensive heart disease with CHF (congestive heart failure) (HCC) Blood pressure stable, cont on toprol XL and lasix  3. Chronic atrial fibrillation (HCC) Rate controlled. Does not wish to  be on anticoagulation due to hx of bleeding. conts on ASA 81 mg daily  4. Primary cancer of right upper lobe of lung ([place]) Followed by radiation oncology and oncologist, completed radiation at this time.   5. Coronary artery disease involving autologous vein coronary bypass graft without angina pectoris Without chest pains, conts on ASA   6. Hypothyroidism due to acquired atrophy of thyroid Stable on synthroid 100 mcg daily  7. Postoperative anemia due to acute blood loss Stable, ongoing follow up by PCP  8. UTI (lower urinary tract infection) -currently taking course of Cipro for UTI, pt be discharge from facility with remaining medication. No ongoing symptoms.   pt is stable for discharge-will need PT/OT per home health. No DME written for, pts daughter reports she has tub bench and 3n1 already at home. Reports home is small and does not wish to have WC at this time. Rx written for pain medication only, pt does not wish for other medication to be written for as she just got refills and does not need them. Reviewed medication list in detail with pt and daughter.  will need to follow up with PCP within 2 weeks.    Carlos American. Harle Battiest  [facility] [phone] 8 am - 5 pm) [phone] (after hours)

## 2015-01-23 NOTE — Progress Notes (Addendum)
Weight and vitals stable. Denies pain. Productive cough with clear to yellow sputum. Denies hemoptysis. Reports "some" difficulty swallowing. Denies a sore throat. Reports hyperpigmentation of upper back within treatment field. Will continue to use radiaplex bid for two weeks from today. Started Cipro 500 mg bid last night. Awaiting culture results. Patient discharged from [facility] today.   BP 157/67 mmHg  Pulse 60  Resp 16  SpO2 100% Wt Readings from Last 3 Encounters:  01/19/15 141 lb 3.2 oz (64.048 kg)  12/27/14 139 lb (63.05 kg)  12/25/14 139 lb 12.8 oz (63.413 kg)

## 2015-01-24 LAB — URINE CULTURE

## 2015-01-26 ENCOUNTER — Ambulatory Visit: Payer: Self-pay

## 2015-01-26 ENCOUNTER — Other Ambulatory Visit: Payer: Self-pay

## 2015-01-27 ENCOUNTER — Telehealth: Payer: Self-pay | Admitting: Radiation Oncology

## 2015-01-27 NOTE — Telephone Encounter (Signed)
Phoned patient's daughter, [name]. Explained the e coli found is her mother's urine is sensitive to Cipro so she should respond to the Cipro given on Thursday. She verbalized understanding and expressed appreciation for the call.

## 2015-01-27 NOTE — Telephone Encounter (Signed)
-----   Message from Tyler Pita, MD sent at 01/25/2015  3:24 PM EST ----- Sam, E. Coli sensitive to Cipro.  She should respond to the Cipro we started Thursday. MM

## 2015-02-14 NOTE — Progress Notes (Signed)
  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 Primary cancer of right upper lobe of lung 162.3 C34.11        Indication for treatment:  Curative, Salvage       Radiation treatment dates:   12/08/2014-01/23/2015  Site/dose:   The patient's recurrent lymph nodes were treated to 66 Gy in 33 fractions of 2 Gy.  Beams/energy:   The patient was treated with TomoTherapy delivering 6 MV photons and daily CT guidance.  Narrative: The patient tolerated radiation treatment relatively well.  She had some mild difficulty swallowing. The patient developed some radiation dermatitis on her upper back. The patient fractured her hip requiring a break in treatment. Following her hospitalization, she was able to complete treatment. Post-operatively, she developed a UTI that was treated with Cipro.   Plan: The patient has completed radiation treatment. The patient will return to radiation oncology clinic for routine followup in one month. I advised her to call or return sooner if she has any questions or concerns related to her recovery or treatment. ________________________________  Sheral Apley. Tammi Klippel, M.D.  This document serves as a record of services personally performed by Tyler Pita, MD. It was created on his behalf by Arlyce Harman, a trained medical scribe. The creation of this record is based on the scribe's personal observations and the provider's statements to them. This document has been checked and approved by the attending provider.

## 2015-02-23 ENCOUNTER — Ambulatory Visit (HOSPITAL_COMMUNITY)
Admission: RE | Admit: 2015-02-23 | Discharge: 2015-02-23 | Disposition: A | Discharge status: Home / Self Care | Payer: Medicare Other | Source: Ambulatory Visit | Attending: Internal Medicine | Admitting: Internal Medicine

## 2015-02-23 DIAGNOSIS — I700 Atherosclerosis of aorta: Secondary | ICD-10-CM | POA: Diagnosis not present

## 2015-02-23 DIAGNOSIS — S2231XA Fracture of one rib, right side, initial encounter for closed fracture: Secondary | ICD-10-CM | POA: Insufficient documentation

## 2015-02-23 DIAGNOSIS — C3411 Malignant neoplasm of upper lobe, right bronchus or lung: Secondary | ICD-10-CM | POA: Diagnosis not present

## 2015-02-23 DIAGNOSIS — J439 Emphysema, unspecified: Secondary | ICD-10-CM | POA: Diagnosis not present

## 2015-02-23 DIAGNOSIS — I519 Heart disease, unspecified: Secondary | ICD-10-CM | POA: Insufficient documentation

## 2015-02-23 DIAGNOSIS — J90 Pleural effusion, not elsewhere classified: Secondary | ICD-10-CM | POA: Diagnosis not present

## 2015-02-23 DIAGNOSIS — C3412 Malignant neoplasm of upper lobe, left bronchus or lung: Secondary | ICD-10-CM | POA: Diagnosis not present

## 2015-02-23 DIAGNOSIS — I2510 Atherosclerotic heart disease of native coronary artery without angina pectoris: Secondary | ICD-10-CM | POA: Insufficient documentation

## 2015-02-23 MED ORDER — IOHEXOL 300 MG/ML  SOLN
75.0000 mL | Freq: Once | INTRAMUSCULAR | Status: AC | PRN
  Administered 2015-02-23: 75 mL via INTRAVENOUS

## 2015-02-26 ENCOUNTER — Ambulatory Visit: Payer: Medicare Other | Admitting: Radiation Oncology

## 2015-03-02 ENCOUNTER — Other Ambulatory Visit (HOSPITAL_BASED_OUTPATIENT_CLINIC_OR_DEPARTMENT_OTHER): Payer: Medicare Other

## 2015-03-02 ENCOUNTER — Ambulatory Visit
Admission: RE | Admit: 2015-03-02 | Discharge: 2015-03-02 | Disposition: A | Discharge status: Home / Self Care | Payer: Medicare Other | Source: Ambulatory Visit | Attending: Radiation Oncology | Admitting: Radiation Oncology

## 2015-03-02 ENCOUNTER — Encounter: Payer: Self-pay | Admitting: Radiation Oncology

## 2015-03-02 VITALS — BP 122/71 | HR 85 | Resp 16 | Wt 144.0 lb

## 2015-03-02 DIAGNOSIS — C3411 Malignant neoplasm of upper lobe, right bronchus or lung: Secondary | ICD-10-CM

## 2015-03-02 LAB — COMPREHENSIVE METABOLIC PANEL
ALBUMIN: 3.6 g/dL (ref 3.5–5.0)
ALK PHOS: 122 U/L (ref 40–150)
ALT: 10 U/L (ref 0–55)
ANION GAP: 13 meq/L — AB (ref 3–11)
AST: 17 U/L (ref 5–34)
BUN: 15.7 mg/dL (ref 7.0–26.0)
CALCIUM: 9.2 mg/dL (ref 8.4–10.4)
CHLORIDE: 98 meq/L (ref 98–109)
CO2: 27 mEq/L (ref 22–29)
CREATININE: 0.9 mg/dL (ref 0.6–1.1)
EGFR: 57 mL/min/{1.73_m2} — ABNORMAL LOW (ref >90)
Glucose: 125 mg/dl (ref 70–140)
POTASSIUM: 3.3 meq/L — AB (ref 3.5–5.1)
Sodium: 138 mEq/L (ref 136–145)
Total Bilirubin: 0.9 mg/dL (ref 0.20–1.20)
Total Protein: 7.2 g/dL (ref 6.4–8.3)

## 2015-03-02 LAB — CBC WITH DIFFERENTIAL/PLATELET
BASO%: 0.8 % (ref 0.0–2.0)
Basophils Absolute: 0.1 10*3/uL (ref 0.0–0.1)
EOS%: 0.8 % (ref 0.0–7.0)
Eosinophils Absolute: 0.1 10*3/uL (ref 0.0–0.5)
HCT: 36.9 % (ref 34.8–46.6)
HEMOGLOBIN: 11.9 g/dL (ref 11.6–15.9)
LYMPH%: 10.1 % — AB (ref 14.0–49.7)
MCH: 28.9 pg (ref 25.1–34.0)
MCHC: 32.4 g/dL (ref 31.5–36.0)
MCV: 89.4 fL (ref 79.5–101.0)
MONO#: 0.8 10*3/uL (ref 0.1–0.9)
MONO%: 8 % (ref 0.0–14.0)
NEUT%: 80.3 % — ABNORMAL HIGH (ref 38.4–76.8)
NEUTROS ABS: 8.5 10*3/uL — AB (ref 1.5–6.5)
PLATELETS: 210 10*3/uL (ref 145–400)
RBC: 4.12 10*6/uL (ref 3.70–5.45)
RDW: 18.9 % — AB (ref 11.2–14.5)
WBC: 10.6 10*3/uL — AB (ref 3.9–10.3)
lymph#: 1.1 10*3/uL (ref 0.9–3.3)

## 2015-03-02 NOTE — Progress Notes (Addendum)
Radiation Oncology         (336) [phone] ________________________________  Name: Marissa [name] MRN: [id number]  Date: 03/02/2015  DOB: 1934-01-18  Follow-Up Visit Note  CC: Marissa Huh, MD  Marissa [name], *  Diagnosis: 81 yo woman with recurrent stage IIIA squamous cell lung cancer s/p previous SBRT for stage I in the right upper lobe    ICD-9-CM ICD-10-CM   1. Primary cancer of right upper lobe of lung (HCC) 162.3 C34.11 CT Chest W Contrast    Interval Since Last Radiation: 5 weeks   12/08/2014-01/23/2015: The patient's recurrent lymph nodes were treated to 66 Gy in 33 fractions of 2 Gy.  02/10/2014, 02/12/2014, 02/17/2014: The target was treated to 54 Gy in 3 fractions of 18 Gy  Narrative:  The patient returns today for routine follow-up. Denies pain, hemoptysis, or difficulty swallowing. Reports an occasional productive cough with clear sputum. Reports shortness of breath with exertion, using a walker at home to ambulate, and a normal appetite. The patient had a chest CT, with contrast, on 02/23/15.  ALLERGIES:  is allergic to coumadin and prozac.  Meds: Current Outpatient Prescriptions  Medication Sig Dispense Refill  . albuterol (PROVENTIL) (2.5 MG/3ML) 0.083% nebulizer solution     . alendronate (FOSAMAX) 70 MG tablet Take 70 mg by mouth once a week.     [name] aspirin EC 325 MG tablet Take 1 tablet (325 mg total) by mouth 2 (two) times daily. 30 tablet 0  . atorvastatin (LIPITOR) 40 MG tablet Take 40 mg by mouth daily. For HLD    . CALCIUM CARBONATE PO Take 1 tablet by mouth 2 (two) times daily.    . digoxin (LANOXIN) 0.125 MG tablet Take 0.125 mg by mouth daily.     [name] docusate sodium (COLACE) 100 MG capsule Take 1 capsule (100 mg total) by mouth 2 (two) times daily. 10 capsule 0  . esomeprazole (NEXIUM) 40 MG capsule Take 40 mg by mouth daily as needed (for acid reflux).     . fluticasone-salmeterol (ADVAIR HFA) 115-21 MCG/ACT inhaler Inhale 2 puffs into the  lungs 2 (two) times daily.     . furosemide (LASIX) 40 MG tablet Take 20-40 mg by mouth 2 (two) times daily. Take 40 mg by mouth in the morning and take 20 mg by mouth in the evening for fluid.    [name] glimepiride (AMARYL) 1 MG tablet Take 1 mg by mouth daily with breakfast.    . HYDROcodone-acetaminophen (NORCO) 5-325 MG tablet Take 1 tablet by mouth every 6 (six) hours as needed. 60 tablet 0  . Ipratropium-Albuterol (COMBIVENT) 20-100 MCG/ACT AERS respimat Inhale 1 puff into the lungs every 6 (six) hours as needed for wheezing or shortness of breath.     [name] ipratropium-albuterol (DUONEB) 0.5-2.5 (3) MG/3ML SOLN Take 3 mLs by nebulization every 6 (six) hours as needed (for shortness of breath).     [name] levothyroxine (SYNTHROID, LEVOTHROID) 100 MCG tablet Take 100 mcg by mouth daily before breakfast.     . metoprolol succinate (TOPROL-XL) 50 MG 24 hr tablet Take 1 tablet (50 mg total) by mouth every evening. 90 tablet 3  . potassium chloride SA (K-DUR,KLOR-CON) 20 MEQ tablet Take 20 mEq by mouth daily.     [name] rOPINIRole (REQUIP) 4 MG tablet Take 4 mg by mouth at bedtime.    . vitamin C (ASCORBIC ACID) 500 MG tablet Take 500 mg by mouth daily.    [name] acetaminophen (TYLENOL) 325 MG tablet Take  2 tablets (650 mg total) by mouth every 6 (six) hours as needed for mild pain (or Fever >/= 101). (Patient not taking: Reported on 01/19/2015)    . ondansetron (ZOFRAN) 4 MG tablet Take 1 tablet (4 mg total) by mouth every 6 (six) hours as needed for nausea. (Patient not taking: Reported on 01/19/2015) 20 tablet 0  . prochlorperazine (COMPAZINE) 10 MG tablet Take 1 tablet (10 mg total) by mouth every 6 (six) hours as needed for nausea or vomiting. (Patient not taking: Reported on 01/19/2015) 30 tablet 0  . senna-docusate (SENOKOT-S) 8.6-50 MG tablet Take 1 tablet by mouth at bedtime as needed for mild constipation. (Patient not taking: Reported on 01/19/2015)    . Wound Cleansers (RADIAPLEX EX) Apply 1 application  topically daily as needed (rain).      No current facility-administered medications for this encounter.    Physical Findings: The patient is in no acute distress. Patient is alert and oriented.  weight is 144 lb (65.318 kg). Her blood pressure is 122/71 and her pulse is 85. Her respiration is 16 and oxygen saturation is 100%.   The patient presents to the clinic in a wheelchair. In no respiratory distress.  Lab Findings: Lab Results  Component Value Date   WBC 4.6 01/19/2015   WBC 8.4 12/29/2014   WBC 15.3* 04/17/2014   HGB 9.9* 01/19/2015   HGB 8.5* 12/29/2014   HGB 13.5 04/17/2014   HCT 30.7* 01/19/2015   HCT 26.9* 12/29/2014   HCT 40.6 04/17/2014   PLT 121* 01/19/2015   PLT 161 12/29/2014   PLT 168 04/17/2014    Lab Results  Component Value Date   NA 139 01/19/2015   NA 133* 12/29/2014   NA 137 04/17/2014   K 3.2* 01/19/2015   K 4.4 12/29/2014   K 2.8* 04/18/2014   CHLORIDE 101 01/19/2015   CO2 28 01/19/2015   CO2 31 12/29/2014   CO2 30 04/17/2014   GLUCOSE 106 01/19/2015   GLUCOSE 145* 12/29/2014   GLUCOSE 244* 04/17/2014   BUN 9.4 01/19/2015   BUN 17 12/29/2014   BUN 19* 04/17/2014   CREATININE 0.7 01/19/2015   CREATININE 0.84 12/29/2014   CREATININE 1.08 04/17/2014   BILITOT 0.69 01/19/2015   BILITOT 1.0 12/26/2014   ALKPHOS 206* 01/19/2015   ALKPHOS 108 12/26/2014   AST 19 01/19/2015   AST 159* 12/26/2014   ALT 18 01/19/2015   ALT 170* 12/26/2014   PROT 6.3* 01/19/2015   PROT 5.4* 12/26/2014   ALBUMIN 3.4* 01/19/2015   ALBUMIN 2.7* 12/26/2014   CALCIUM 9.9 01/19/2015   CALCIUM 8.3* 12/29/2014   CALCIUM 9.0 04/17/2014   ANIONGAP 9 01/19/2015   ANIONGAP 9 12/29/2014   ANIONGAP 9 04/17/2014    Radiographic Findings: Ct Chest W Contrast  02/23/2015  CLINICAL DATA:  Right upper lobe lung cancer restaging.  Emphysema. EXAM: CT CHEST WITH CONTRAST TECHNIQUE: Multidetector CT imaging of the chest was performed during intravenous contrast  administration. CONTRAST:  3m OMNIPAQUE IOHEXOL 300 MG/ML  SOLN COMPARISON:  Multiple exams, including 11/20/2014 FINDINGS: Mediastinum/Nodes: Coronary, aortic arch, and branch vessel atherosclerotic vascular disease. Prosthetic mitral valve noted with prominent left atrium. Subcarinal lymph node 10 mm in short axis, image 24 series 2. Poor definition of fat planes along the right lower paratracheal margin, possibly from prior radiation therapy. Lungs/Pleura: Small right pleural effusion is slightly larger than on 11/20/2014. Irregular right upper lobe airspace opacity proximally 7.8 by 6.7 cm, formerly 5.7 by 5.5 cm.  Stable triangular region of volume loss in the left lower lobe with small marginal calcifications. Slightly worsened dependent subsegmental atelectasis in the right lower lobe. 2-3 mm subpleural nodule in the left upper lobe, image 22 series 5. Bilateral airway thickening. Upper abdomen: Unremarkable Musculoskeletal: Right lateral third rib fracture, image 12 series 5, new from 11/20/2014. We partially image significant findings in the right glenohumeral joint. IMPRESSION: 1. The right upper lobe nodule/mass is not readily distinguishable from the surrounding airspace opacity but there is increased airspace opacity. This could be obscuring an increase in size of the underlying lesion, but may also be from radiation pneumonitis or pneumonia. Mild increase in size of the right pleural effusion. 2. Stable triangular focus of volume loss in the left lower lobe. 3. Airway thickening is present, suggesting bronchitis or reactive airways disease. 4. Left atrial enlargement.  Mitral valve prosthesis noted. 5. Interval right lateral third rib fracture, etiology uncertain. 6. Degenerative findings of the right glenohumeral joint, likely with chronic deformity from an old fracture. 7. Coronary, aortic arch, and branch vessel atherosclerotic vascular disease. Electronically Signed   By: Van Clines M.D.    On: 02/23/2015 10:31    Impression:  The patient is recovering from the effects of radiation.  CT shows no evidence of progressive disease.  Plan: The patient will follow up with Dr. Julien Nordmann on 12/19 and will have a CT scan in 3 months and follow up with me afterwards.  _____________________________________  Sheral Apley Tammi Klippel, M.D.  This document serves as a record of services personally performed by Tyler Pita, MD. It was created on his behalf by Darcus Austin, a trained medical scribe. The creation of this record is based on the scribe's personal observations and the provider's statements to them. This document has been checked and approved by the attending provider.

## 2015-03-02 NOTE — Progress Notes (Signed)
Weight and vitals stable. Denies pain. Reports an occasional productive cough with clear sputum. Denies hemoptysis. Reports shortness of breath with exertion. Denies pain or difficulty associated with swallowing. Faint hyperpigmentation within treatment field without desquamation. Reports using a walker at home to ambulate. Reports her appetite is normal.   BP 122/71 mmHg  Pulse 85  Resp 16  Wt 144 lb (65.318 kg)  SpO2 100% Wt Readings from Last 3 Encounters:  03/02/15 144 lb (65.318 kg)  01/23/15 142 lb (64.411 kg)  01/19/15 141 lb 3.2 oz (64.048 kg)

## 2015-03-09 ENCOUNTER — Telehealth: Payer: Self-pay | Admitting: Internal Medicine

## 2015-03-09 ENCOUNTER — Encounter: Payer: Self-pay | Admitting: Internal Medicine

## 2015-03-09 ENCOUNTER — Ambulatory Visit (HOSPITAL_BASED_OUTPATIENT_CLINIC_OR_DEPARTMENT_OTHER): Payer: Medicare Other | Admitting: Internal Medicine

## 2015-03-09 VITALS — BP 119/42 | HR 79 | Temp 97.9°F | Resp 19 | Ht 61.0 in | Wt 135.8 lb

## 2015-03-09 DIAGNOSIS — R5383 Other fatigue: Secondary | ICD-10-CM

## 2015-03-09 DIAGNOSIS — R0602 Shortness of breath: Secondary | ICD-10-CM

## 2015-03-09 DIAGNOSIS — C3490 Malignant neoplasm of unspecified part of unspecified bronchus or lung: Secondary | ICD-10-CM

## 2015-03-09 DIAGNOSIS — R05 Cough: Secondary | ICD-10-CM | POA: Diagnosis not present

## 2015-03-09 DIAGNOSIS — C3411 Malignant neoplasm of upper lobe, right bronchus or lung: Secondary | ICD-10-CM

## 2015-03-09 NOTE — Telephone Encounter (Signed)
Gave relative avs report and appointments for March. Per relative f/u with MM scheduled for same day as f/u with Dr. Tammi Klippel 06/15/15. Patient already on schedule for lab/ct-chest with contrast for 3/13 ordered by Dr. Tammi Klippel (same ct ordered by MM today for March) - relative aware.

## 2015-03-09 NOTE — Progress Notes (Signed)
[facility] [phone]   [phone]  OFFICE PROGRESS NOTE  [name], MD [address]  [place] [id number]  DIAGNOSIS: Stage IIIA (T2a, N2, M0) non-small cell lung cancer, squamous cell carcinoma presented with recurrence on the right upper lobe in addition to mediastinal lymphadenopathy diagnosed in September 2016.  PRIOR THERAPY: Concurrent chemoradiation with weekly carboplatin for AUC of 2 and paclitaxel 45 MG/M2. S/P 6 cycles.   CURRENT THERAPY: Observation.  INTERVAL HISTORY: Marissa [name] 81 y.o. female returns to the clinic today for follow-up visit accompanied by her daughter. The patient is feeling fine today was no specific complaints except for the baseline shortness breath and cough productive of yellowish sputum. She completed a course of concurrent chemoradiation and tolerated it fairly well with no significant adverse effects except for fatigue. She denied having any significant dysphagia or odynophagia. She denied having any significant nausea or vomiting. She has no fever or chills. She denied having any significant weight loss or night sweats. She had repeat CT scan of the chest performed recently and she is here for evaluation and discussion of her scan results.[name]  MEDICAL HISTORY: Past Medical History  Diagnosis Date  . HTN (hypertension)   . Hypercholesteremia   . CAD (coronary artery disease)     s/p CABG 03/2011  . Aortic valve disorders   . RLS (restless legs syndrome)   . A-fib ([place])   . Osteopenia   . Mitral valve disease     s/p MVR 03/2011 w/ 27 mm porcine valve  . Hypothyroidism   . COPD (chronic obstructive pulmonary disease) ([place])   . CHF (congestive heart failure) ([place])   . Aortic stenosis     (s/p AVR w/ 21 mm porcine valve 03/2011  . Mastoiditis   . Myocardial infarction ([place])     "slight"  . Dysrhythmia   . Shortness of breath     with exertion  . Pre-diabetes   . GERD  (gastroesophageal reflux disease)     takes nexium prn  . Arthritis   . History of blood transfusion     Rheumatic Fever  . Lung cancer ([place])   . Pneumonia   . Diabetes mellitus without complication ([place])   . Full code status 12/22/2014  . Full code status 12/22/2014    ALLERGIES:  is allergic to coumadin and prozac.  MEDICATIONS:  Current Outpatient Prescriptions  Medication Sig Dispense Refill  . albuterol (PROVENTIL) (2.5 MG/3ML) 0.083% nebulizer solution     . alendronate (FOSAMAX) 70 MG tablet Take 70 mg by mouth once a week.     [name] aspirin EC 325 MG tablet Take 1 tablet (325 mg total) by mouth 2 (two) times daily. 30 tablet 0  . atorvastatin (LIPITOR) 40 MG tablet Take 40 mg by mouth daily. For HLD    . CALCIUM CARBONATE PO Take 1 tablet by mouth 2 (two) times daily.    . digoxin (LANOXIN) 0.125 MG tablet Take 0.125 mg by mouth daily.     [name] docusate sodium (COLACE) 100 MG capsule Take 1 capsule (100 mg total) by mouth 2 (two) times daily. 10 capsule 0  . esomeprazole (NEXIUM) 40 MG capsule Take 40 mg by mouth daily as needed (for acid reflux).     . fluticasone-salmeterol (ADVAIR HFA) 115-21 MCG/ACT inhaler Inhale 2 puffs into the lungs 2 (two) times daily.     . furosemide (LASIX) 40 MG tablet Take 20-40  mg by mouth 2 (two) times daily. Take 40 mg by mouth in the morning and take 20 mg by mouth in the evening for fluid.    [name] glimepiride (AMARYL) 1 MG tablet Take 1 mg by mouth daily with breakfast.    . Ipratropium-Albuterol (COMBIVENT) 20-100 MCG/ACT AERS respimat Inhale 1 puff into the lungs every 6 (six) hours as needed for wheezing or shortness of breath.     [name] ipratropium-albuterol (DUONEB) 0.5-2.5 (3) MG/3ML SOLN Take 3 mLs by nebulization every 6 (six) hours as needed (for shortness of breath).     . metoprolol succinate (TOPROL-XL) 50 MG 24 hr tablet Take 1 tablet (50 mg total) by mouth every evening. 90 tablet 3  . potassium chloride SA (K-DUR,KLOR-CON) 20 MEQ tablet Take  20 mEq by mouth daily.     [name] rOPINIRole (REQUIP) 4 MG tablet Take 4 mg by mouth at bedtime.    . vitamin C (ASCORBIC ACID) 500 MG tablet Take 500 mg by mouth daily.    [name] acetaminophen (TYLENOL) 325 MG tablet Take 2 tablets (650 mg total) by mouth every 6 (six) hours as needed for mild pain (or Fever >/= 101). (Patient not taking: Reported on 01/19/2015)    . HYDROcodone-acetaminophen (NORCO) 5-325 MG tablet Take 1 tablet by mouth every 6 (six) hours as needed. (Patient not taking: Reported on 03/09/2015) 60 tablet 0  . levothyroxine (SYNTHROID, LEVOTHROID) 100 MCG tablet Take 100 mcg by mouth daily before breakfast.     . ondansetron (ZOFRAN) 4 MG tablet Take 1 tablet (4 mg total) by mouth every 6 (six) hours as needed for nausea. (Patient not taking: Reported on 01/19/2015) 20 tablet 0  . prochlorperazine (COMPAZINE) 10 MG tablet Take 1 tablet (10 mg total) by mouth every 6 (six) hours as needed for nausea or vomiting. (Patient not taking: Reported on 01/19/2015) 30 tablet 0  . senna-docusate (SENOKOT-S) 8.6-50 MG tablet Take 1 tablet by mouth at bedtime as needed for mild constipation. (Patient not taking: Reported on 01/19/2015)    . Wound Cleansers (RADIAPLEX EX) Apply 1 application topically daily as needed (rain). Reported on 03/09/2015     No current facility-administered medications for this visit.    SURGICAL HISTORY:  Past Surgical History  Procedure Laterality Date  . Mitral valve replacement      1990's  . Laparoscopic cholecystectomy    . Aortic valve replacement    . Tonsillectomy    . Coronary artery bypass graft  03/2011    1990's also  . Video bronchoscopy with endobronchial navigation N/A 01/06/2014    Procedure: VIDEO BRONCHOSCOPY WITH ENDOBRONCHIAL NAVIGATION;  Surgeon: [name], MD;  Location: [facility];  Service: Thoracic;  Laterality: N/A;  . Video bronchoscopy with endobronchial ultrasound N/A 01/06/2014    Procedure: VIDEO BRONCHOSCOPY WITH ENDOBRONCHIAL  ULTRASOUND;  Surgeon: [name], MD;  Location: [facility];  Service: Thoracic;  Laterality: N/A;  . Video bronchoscopy with endobronchial navigation N/A 11/21/2014    Procedure: VIDEO BRONCHOSCOPY WITH ENDOBRONCHIAL NAVIGATION;  Surgeon: [name], MD;  Location: [facility];  Service: Thoracic;  Laterality: N/A;  . Video bronchoscopy with endobronchial ultrasound N/A 11/21/2014    Procedure: VIDEO BRONCHOSCOPY WITH ENDOBRONCHIAL ULTRASOUND;  Surgeon: [name], MD;  Location: [facility];  Service: Thoracic;  Laterality: N/A;  . Compression hip screw Left 12/26/2014    Procedure: ORIF LEFT HIP INTERTROCHANTRIC FRACTURE;  Surgeon: Frederik Pear, MD;  Location: [facility];  Service: Orthopedics;  Laterality:  Left;    REVIEW OF SYSTEMS:  Constitutional: positive for fatigue Eyes: negative Ears, nose, mouth, throat, and face: negative Respiratory: positive for cough, dyspnea on exertion and sputum Cardiovascular: negative Gastrointestinal: negative Genitourinary:negative Integument/breast: negative Hematologic/lymphatic: negative Musculoskeletal:negative Neurological: negative Behavioral/Psych: negative Endocrine: negative Allergic/Immunologic: negative   PHYSICAL EXAMINATION: General appearance: alert, cooperative, fatigued and no distress Head: Normocephalic, without obvious abnormality, atraumatic Neck: no adenopathy, no JVD, supple, symmetrical, trachea midline and thyroid not enlarged, symmetric, no tenderness/mass/nodules Lymph nodes: Cervical, supraclavicular, and axillary nodes normal. Resp: clear to auscultation bilaterally Back: symmetric, no curvature. ROM normal. No CVA tenderness. Cardio: regular rate and rhythm, S1, S2 normal, no murmur, click, rub or gallop GI: soft, non-tender; bowel sounds normal; no masses,  no organomegaly Extremities: extremities normal, atraumatic, no cyanosis or edema Neurologic: Alert and oriented X 3, normal strength and tone. Normal  symmetric reflexes. Normal coordination and gait  ECOG PERFORMANCE STATUS: 1 - Symptomatic but completely ambulatory  Blood pressure 119/42, pulse 79, temperature 97.9 F (36.6 C), temperature source Oral, resp. rate 19, height '5\' 1"'$  (1.549 m), weight 135 lb 12.8 oz (61.598 kg), SpO2 88 %.  LABORATORY DATA: Lab Results  Component Value Date   WBC 10.6* 03/02/2015   HGB 11.9 03/02/2015   HCT 36.9 03/02/2015   MCV 89.4 03/02/2015   PLT 210 03/02/2015      Chemistry      Component Value Date/Time   NA 138 03/02/2015 1051   NA 133* 12/29/2014 0010   NA 137 04/17/2014 0409   K 3.3* 03/02/2015 1051   K 4.4 12/29/2014 0010   K 2.8* 04/18/2014 0422   CL 93* 12/29/2014 0010   CL 98 04/17/2014 0409   CO2 27 03/02/2015 1051   CO2 31 12/29/2014 0010   CO2 30 04/17/2014 0409   BUN 15.7 03/02/2015 1051   BUN 17 12/29/2014 0010   BUN 19* 04/17/2014 0409   CREATININE 0.9 03/02/2015 1051   CREATININE 0.84 12/29/2014 0010   CREATININE 1.08 04/17/2014 0409      Component Value Date/Time   CALCIUM 9.2 03/02/2015 1051   CALCIUM 8.3* 12/29/2014 0010   CALCIUM 9.0 04/17/2014 0409   ALKPHOS 122 03/02/2015 1051   ALKPHOS 108 12/26/2014 1450   AST 17 03/02/2015 1051   AST 159* 12/26/2014 1450   ALT 10 03/02/2015 1051   ALT 170* 12/26/2014 1450   BILITOT 0.90 03/02/2015 1051   BILITOT 1.0 12/26/2014 1450       RADIOGRAPHIC STUDIES: Ct Chest W Contrast  02/23/2015  CLINICAL DATA:  Right upper lobe lung cancer restaging.  Emphysema. EXAM: CT CHEST WITH CONTRAST TECHNIQUE: Multidetector CT imaging of the chest was performed during intravenous contrast administration. CONTRAST:  47m OMNIPAQUE IOHEXOL 300 MG/ML  SOLN COMPARISON:  Multiple exams, including 11/20/2014 FINDINGS: Mediastinum/Nodes: Coronary, aortic arch, and branch vessel atherosclerotic vascular disease. Prosthetic mitral valve noted with prominent left atrium. Subcarinal lymph node 10 mm in short axis, image 24 series 2. Poor  definition of fat planes along the right lower paratracheal margin, possibly from prior radiation therapy. Lungs/Pleura: Small right pleural effusion is slightly larger than on 11/20/2014. Irregular right upper lobe airspace opacity proximally 7.8 by 6.7 cm, formerly 5.7 by 5.5 cm. Stable triangular region of volume loss in the left lower lobe with small marginal calcifications. Slightly worsened dependent subsegmental atelectasis in the right lower lobe. 2-3 mm subpleural nodule in the left upper lobe, image 22 series 5. Bilateral airway thickening. Upper abdomen: Unremarkable Musculoskeletal:  Right lateral third rib fracture, image 12 series 5, new from 11/20/2014. We partially image significant findings in the right glenohumeral joint. IMPRESSION: 1. The right upper lobe nodule/mass is not readily distinguishable from the surrounding airspace opacity but there is increased airspace opacity. This could be obscuring an increase in size of the underlying lesion, but may also be from radiation pneumonitis or pneumonia. Mild increase in size of the right pleural effusion. 2. Stable triangular focus of volume loss in the left lower lobe. 3. Airway thickening is present, suggesting bronchitis or reactive airways disease. 4. Left atrial enlargement.  Mitral valve prosthesis noted. 5. Interval right lateral third rib fracture, etiology uncertain. 6. Degenerative findings of the right glenohumeral joint, likely with chronic deformity from an old fracture. 7. Coronary, aortic arch, and branch vessel atherosclerotic vascular disease. Electronically Signed   By: Van Clines M.D.   On: 02/23/2015 10:31    ASSESSMENT AND PLAN: This is a very pleasant 81 years old white female with recurrent non-small cell lung cancer presented as a stage IIIa, squamous cell carcinoma. The patient completed a course of concurrent chemoradiation with weekly carboplatin and paclitaxel. She status post 6 cycles. She is tolerating her  treatment well. The patient continues to have mild fatigue as well as shortness breath and cough productive of yellowish sputum. Her recent CT scan of the chest showed no evidence for disease progression and the patient had a disease in the right upper lobe secondary to recent radiation. I discussed the scan results with the patient and her daughter. The patient may not be a good candidate for consolidation chemotherapy at this point because of her performance status. I recommended for her to continue on observation for now. The patient would come back for follow-up visit in 3 months for evaluation after repeating CT scan of the chest for restaging of her disease. The patient was advised to call immediately if she has any concerning symptoms in the interval. The patient voices understanding of current disease status and treatment options and is in agreement with the current care plan.  All questions were answered. The patient knows to call the clinic with any problems, questions or concerns. We can certainly see the patient much sooner if necessary.  Disclaimer: This note was dictated with voice recognition software. Similar sounding words can inadvertently be transcribed and may not be corrected upon review.

## 2015-03-18 ENCOUNTER — Encounter: Payer: Self-pay | Admitting: Skilled Nursing Facility1

## 2015-03-18 NOTE — Progress Notes (Signed)
Subjective:     Patient ID: Marissa [name], female   DOB: 06-Oct-1933, 81 y.o.   MRN: [id number]  HPI   Review of Systems     Objective:   Physical Exam To assist the pt in identifying dietary approaches to gain some lost wt back.     Assessment:     Pt identified as being malnourished due to wt loss. Pt contacted via the telephone at [phone]. Pts daughter states the pt was asleep. Pts daughter states her mother has not lost any wt and she eats fine. Pts daughter addamitaly states her mother does not need any intervention.    Plan:     No plan identified at this time.

## 2015-04-06 ENCOUNTER — Encounter: Payer: Self-pay | Admitting: Cardiology

## 2015-04-06 ENCOUNTER — Ambulatory Visit (INDEPENDENT_AMBULATORY_CARE_PROVIDER_SITE_OTHER): Payer: Medicare Other | Admitting: Cardiology

## 2015-04-06 VITALS — BP 130/64 | HR 80 | Ht 61.0 in | Wt 132.0 lb

## 2015-04-06 DIAGNOSIS — I351 Nonrheumatic aortic (valve) insufficiency: Secondary | ICD-10-CM | POA: Insufficient documentation

## 2015-04-06 DIAGNOSIS — I5032 Chronic diastolic (congestive) heart failure: Secondary | ICD-10-CM | POA: Diagnosis not present

## 2015-04-06 DIAGNOSIS — I350 Nonrheumatic aortic (valve) stenosis: Secondary | ICD-10-CM | POA: Diagnosis not present

## 2015-04-06 DIAGNOSIS — C3411 Malignant neoplasm of upper lobe, right bronchus or lung: Secondary | ICD-10-CM | POA: Diagnosis not present

## 2015-04-06 DIAGNOSIS — I480 Paroxysmal atrial fibrillation: Secondary | ICD-10-CM

## 2015-04-06 DIAGNOSIS — Z952 Presence of prosthetic heart valve: Secondary | ICD-10-CM | POA: Insufficient documentation

## 2015-04-06 DIAGNOSIS — Z954 Presence of other heart-valve replacement: Secondary | ICD-10-CM

## 2015-04-06 DIAGNOSIS — Z8669 Personal history of other diseases of the nervous system and sense organs: Secondary | ICD-10-CM | POA: Diagnosis not present

## 2015-04-06 NOTE — Patient Instructions (Addendum)
Medication Instructions:   STOP DIGOXIN   If you need a refill on your cardiac medications before your next appointment, please call your pharmacy.  Labwork:  NONE ORDER TODAY   Testing/Procedures:  NONE ORDER TODAY   Follow-Up: Your physician wants you to follow-up in:  IN  6  MONTHS WITH DR Marlou Porch   You will receive a reminder letter in the mail two months in advance. If you don't receive a letter, please call our office to schedule the follow-up appointment.      Any Other Special Instructions Will Be Listed Below (If Applicable).

## 2015-04-06 NOTE — Progress Notes (Signed)
[place], [address], [place]  [address] Phone: [phone] Fax:  [phone]  Date:  04/06/2015   ID:  Marissa [name], DOB Oct 18, 1933, MRN [id number]  PCP:  [name], MD   History of Present Illness: Marissa [name] is a 82 y.o. female with coronary artery disease status post bypass surgery with redo bypass surgery in January of 2013, COPD, aortic valve replacement in 2013 with 21 mm porcine valve, history of rheumatic fever with mitral valve replacement in 2013 with 27 mm porcine valve, history of atrial fibrillation, pulmonary hypertension, heart failure, stage IIIA Non-small cell lung cancer, squamous cell carcinoma presented with recurrence in the right upper lobe and additional mediastinal lymphadenopathy diagnosed in September 2016 currently undergoing chemoradiation with carboplatin and paclitaxel here for follow-up.  She is demonstrating fairly significant shortness of breath which is at baseline.  Coronary bypass surgery was performed in [facility]. She had moderate aortic regurgitation, moderate to severe pulmonary hypertension, right coronary artery was occluded, patent right coronary bypass graft with some irregularity, LIMA to LAD patent in 2013.   She had a chronic long-standing atrial fibrillation history but has been intolerant to anticoagulants. Coumadin very small doses have caused bleeding into her ear, she states. She's been reluctant to try anticoagulantion again because of this. She has never had a stroke. Her redo bypass involved a saphenous vein graft to the RCA and redo mitral valve replacement.  She has had a previous admission to [facility] for "diastolic heart failure ".Normal ejection fraction, normal functioning mitral valve and aortic valve prosthesis. Eliquis at that time was started but she stopped taking.  Micardis was discontinued secondary to low blood pressure, Lasix was changed to 40 a.m., 20 p.m.  Also since  her last visit, aortic insufficiency has developed, loud murmur noted. Echocardiogram reviewed from October 2016.she also has suffered a hip fracture. Continue to encourage physical therapy. Home exercises.  She feels generalized fatigue from chemotherapy. Still remains positive. She is here with her daughter.      Wt Readings from Last 3 Encounters:  04/06/15 132 lb (59.875 kg)  03/09/15 135 lb 12.8 oz (61.598 kg)  03/02/15 144 lb (65.318 kg)     Past Medical History  Diagnosis Date  . HTN (hypertension)   . Hypercholesteremia   . CAD (coronary artery disease)     s/p CABG 03/2011  . Aortic valve disorders   . RLS (restless legs syndrome)   . A-fib ([place])   . Osteopenia   . Mitral valve disease     s/p MVR 03/2011 w/ 27 mm porcine valve  . Hypothyroidism   . COPD (chronic obstructive pulmonary disease) ([place])   . CHF (congestive heart failure) ([place])   . Aortic stenosis     (s/p AVR w/ 21 mm porcine valve 03/2011  . Mastoiditis   . Myocardial infarction ([place])     "slight"  . Dysrhythmia   . Shortness of breath     with exertion  . Pre-diabetes   . GERD (gastroesophageal reflux disease)     takes nexium prn  . Arthritis   . History of blood transfusion     Rheumatic Fever  . Lung cancer ([place])   . Pneumonia   . Diabetes mellitus without complication ([place])   . Full code status 12/22/2014  . Full code status 12/22/2014    Past Surgical History  Procedure Laterality Date  . Mitral valve replacement  1990's  . Laparoscopic cholecystectomy    . Aortic valve replacement    . Tonsillectomy    . Coronary artery bypass graft  03/2011    1990's also  . Video bronchoscopy with endobronchial navigation N/A 01/06/2014    Procedure: VIDEO BRONCHOSCOPY WITH ENDOBRONCHIAL NAVIGATION;  Surgeon: [name], MD;  Location: [place];  Service: Thoracic;  Laterality: N/A;  . Video bronchoscopy with endobronchial ultrasound N/A 01/06/2014    Procedure: VIDEO BRONCHOSCOPY WITH  ENDOBRONCHIAL ULTRASOUND;  Surgeon: [name], MD;  Location: [place];  Service: Thoracic;  Laterality: N/A;  . Video bronchoscopy with endobronchial navigation N/A 11/21/2014    Procedure: VIDEO BRONCHOSCOPY WITH ENDOBRONCHIAL NAVIGATION;  Surgeon: [name], MD;  Location: [place];  Service: Thoracic;  Laterality: N/A;  . Video bronchoscopy with endobronchial ultrasound N/A 11/21/2014    Procedure: VIDEO BRONCHOSCOPY WITH ENDOBRONCHIAL ULTRASOUND;  Surgeon: [name], MD;  Location: [place];  Service: Thoracic;  Laterality: N/A;  . Compression hip screw Left 12/26/2014    Procedure: ORIF LEFT HIP INTERTROCHANTRIC FRACTURE;  Surgeon: Frederik Pear, MD;  Location: [place];  Service: Orthopedics;  Laterality: Left;    Current Outpatient Prescriptions  Medication Sig Dispense Refill  . acetaminophen (TYLENOL) 325 MG tablet Take 2 tablets (650 mg total) by mouth every 6 (six) hours as needed for mild pain (or Fever >/= 101).    [name] albuterol (PROVENTIL) (2.5 MG/3ML) 0.083% nebulizer solution     . alendronate (FOSAMAX) 70 MG tablet Take 70 mg by mouth once a week.     [name] aspirin EC 325 MG tablet Take 1 tablet (325 mg total) by mouth 2 (two) times daily. 30 tablet 0  . atorvastatin (LIPITOR) 40 MG tablet Take 40 mg by mouth daily. For HLD    . CALCIUM CARBONATE PO Take 1 tablet by mouth 2 (two) times daily.    . digoxin (LANOXIN) 0.125 MG tablet Take 0.125 mg by mouth daily.     [name] docusate sodium (COLACE) 100 MG capsule Take 1 capsule (100 mg total) by mouth 2 (two) times daily. 10 capsule 0  . esomeprazole (NEXIUM) 40 MG capsule Take 40 mg by mouth daily as needed (for acid reflux).     . fluticasone-salmeterol (ADVAIR HFA) 115-21 MCG/ACT inhaler Inhale 2 puffs into the lungs 2 (two) times daily.     . furosemide (LASIX) 40 MG tablet Take 20-40 mg by mouth 2 (two) times daily. Take 40 mg by mouth in the morning and take 20 mg by mouth in the evening for fluid.    [name] glimepiride  (AMARYL) 1 MG tablet Take 1 mg by mouth daily with breakfast.    . HYDROcodone-acetaminophen (NORCO) 5-325 MG tablet Take 1 tablet by mouth every 6 (six) hours as needed. 60 tablet 0  . Ipratropium-Albuterol (COMBIVENT) 20-100 MCG/ACT AERS respimat Inhale 1 puff into the lungs every 6 (six) hours as needed for wheezing or shortness of breath.     [name] ipratropium-albuterol (DUONEB) 0.5-2.5 (3) MG/3ML SOLN Take 3 mLs by nebulization every 6 (six) hours as needed (for shortness of breath).     [name] levothyroxine (SYNTHROID, LEVOTHROID) 100 MCG tablet Take 100 mcg by mouth daily before breakfast.     . metoprolol succinate (TOPROL-XL) 50 MG 24 hr tablet Take 1 tablet (50 mg total) by mouth every evening. 90 tablet 3  . ondansetron (ZOFRAN) 4 MG tablet Take 1 tablet (4 mg total) by mouth every 6 (six) hours as  needed for nausea. 20 tablet 0  . potassium chloride SA (K-DUR,KLOR-CON) 20 MEQ tablet Take 20 mEq by mouth daily.     . prochlorperazine (COMPAZINE) 10 MG tablet Take 1 tablet (10 mg total) by mouth every 6 (six) hours as needed for nausea or vomiting. 30 tablet 0  . rOPINIRole (REQUIP) 4 MG tablet Take 4 mg by mouth at bedtime.    . senna-docusate (SENOKOT-S) 8.6-50 MG tablet Take 1 tablet by mouth at bedtime as needed for mild constipation.    . vitamin C (ASCORBIC ACID) 500 MG tablet Take 500 mg by mouth daily.    . Wound Cleansers (RADIAPLEX EX) Apply 1 application topically daily as needed (rain). Reported on 03/09/2015     No current facility-administered medications for this visit.    Allergies:    Allergies  Allergen Reactions  . Coumadin [Warfarin Sodium] Other (See Comments)    Reaction: Bleeding from ears  . Prozac [Fluoxetine Hcl] Rash    Social History:  The patient  reports that she has quit smoking. Her smoking use included Cigarettes. She has a 15 pack-year smoking history. She has never used smokeless tobacco. She reports that she does not drink alcohol or use illicit drugs.  her son died in 10/10/2011.  ROS:  Please see the history of present illness.   Denies any bleeding, syncope, orthopnea, PND  PHYSICAL EXAM: VS:  BP 130/64 mmHg  Pulse 80  Ht '5\' 1"'$  (1.549 m)  Wt 132 lb (59.875 kg)  BMI 24.95 kg/m2 Well nourished, well developed, in no acute distresstrouble breathing with ambulation HEENT: normal Neck: no JVD Cardiac:  Irregularly irregular; loud singing diastolic murmurPrior surgical scar noted Lungs:  Distant breath sounds bilaterally, no active wheezing Abd: soft, nontender, no hepatomegaly Ext: no edemaMild erythema on medial aspect of ankle right leg.  Skin: warm and dry Neuro: no focal abnormalities noted  EKG: 08/11/14-atrial fibrillation heart rate 70, rightward axis, nonspecific ST-T wave changes, LVH with repolarization abnormality-previously 02/25/13- Atrial fibrillation, heart rate 80, nonspecific ST changes   09/09/13-atrial fibrillation, 77, borderline right bundle branch block.   Echocardiogram: 12/26/14 - Left ventricle: The cavity size was normal. Wall thickness was increased in a pattern of severe LVH. Systolic function was vigorous. The estimated ejection fraction was in the range of 65% to 70%. - Aortic valve: Since report of Aug 2015, gradients acrross the AV prosthesis are increased and AI is present AV prosthesis difficult to see. Peak and mean gradients through the valve are 45 and 22 Hg. There was moderate regurgitation. - Mitral valve: Peak gradient across the MV prosthesis is 14 mm Hg. - Left atrium: The atrium was severely dilated. - Pulmonary arteries: PA peak pressure: 49 mm Hg (S).   ASSESSMENT AND PLAN:  1. Lung cancer-recurrent right upper lobe. Dr. Earlie Server. 2. Coronary artery disease-status post bypass. No active anginal symptoms. Prior bypass anatomy reviewed. 3. Chronic diastolic heart failure-encouraged her to utilize her Lasix.dosages reviewed. Evening dose was cut back at one point by Dr.  Marisue Humble. This is fine.  4. Atrial fibrillation- previous lengthy discussions about anticoagulation. She does not wish to restart Coumadin because of previous inner ear bleeding, mastoid. She adamantly does not wish to be placed back on anticoagulation. She understands the risk of stroke. Dilated left atrium noted. Previous history of rheumatic heart disease. She was placed on Eliquis however this was discontinued presumably for the biopsy. I asked her about restarting and she did not wish to do  so. Nonetheless, well rate controlled and we will stop her digoxin. 5. Aortic valve replacement-previous aortic stenosis. Echocardiogram reviewed. 12/26/14 which shows new aortic insufficiency, musical singing murmur noted on exam.. 6. Mitral valve replacement as well.dental prophylaxis 7. COPD-she states that she quit smoking in the 80s. She wishes that there were a therapy to fix the emphysema.home oxygen 8. Peripheral neuropathy- Dr. Marisue Humble. She has excellent dorsalis pedis pulse. She was to discuss restless leg syndrome with him. 9. 6 month f/u  Signed, Candee Furbish, MD [facility]  04/06/2015 11:34 AM

## 2015-04-28 ENCOUNTER — Other Ambulatory Visit: Payer: Self-pay | Admitting: Cardiology

## 2015-05-25 DIAGNOSIS — E039 Hypothyroidism, unspecified: Secondary | ICD-10-CM | POA: Diagnosis not present

## 2015-05-25 DIAGNOSIS — I509 Heart failure, unspecified: Secondary | ICD-10-CM | POA: Diagnosis not present

## 2015-05-25 DIAGNOSIS — G629 Polyneuropathy, unspecified: Secondary | ICD-10-CM | POA: Diagnosis not present

## 2015-05-25 DIAGNOSIS — J449 Chronic obstructive pulmonary disease, unspecified: Secondary | ICD-10-CM | POA: Diagnosis not present

## 2015-05-25 DIAGNOSIS — E1149 Type 2 diabetes mellitus with other diabetic neurological complication: Secondary | ICD-10-CM | POA: Diagnosis not present

## 2015-05-25 DIAGNOSIS — I2510 Atherosclerotic heart disease of native coronary artery without angina pectoris: Secondary | ICD-10-CM | POA: Diagnosis not present

## 2015-05-25 DIAGNOSIS — C3490 Malignant neoplasm of unspecified part of unspecified bronchus or lung: Secondary | ICD-10-CM | POA: Diagnosis not present

## 2015-05-25 DIAGNOSIS — E119 Type 2 diabetes mellitus without complications: Secondary | ICD-10-CM | POA: Diagnosis not present

## 2015-05-25 DIAGNOSIS — E7800 Pure hypercholesterolemia, unspecified: Secondary | ICD-10-CM | POA: Diagnosis not present

## 2015-05-25 DIAGNOSIS — I110 Hypertensive heart disease with heart failure: Secondary | ICD-10-CM | POA: Diagnosis not present

## 2015-06-01 ENCOUNTER — Ambulatory Visit: Payer: Medicare Other

## 2015-06-01 ENCOUNTER — Other Ambulatory Visit (HOSPITAL_BASED_OUTPATIENT_CLINIC_OR_DEPARTMENT_OTHER): Payer: Medicare Other

## 2015-06-01 ENCOUNTER — Encounter (HOSPITAL_COMMUNITY): Payer: Self-pay

## 2015-06-01 ENCOUNTER — Ambulatory Visit (HOSPITAL_COMMUNITY)
Admission: RE | Admit: 2015-06-01 | Discharge: 2015-06-01 | Disposition: A | Discharge status: Home / Self Care | Payer: Medicare Other | Source: Ambulatory Visit | Attending: Radiation Oncology | Admitting: Radiation Oncology

## 2015-06-01 ENCOUNTER — Other Ambulatory Visit: Payer: Self-pay | Admitting: Internal Medicine

## 2015-06-01 DIAGNOSIS — R911 Solitary pulmonary nodule: Secondary | ICD-10-CM | POA: Diagnosis not present

## 2015-06-01 DIAGNOSIS — J479 Bronchiectasis, uncomplicated: Secondary | ICD-10-CM | POA: Diagnosis not present

## 2015-06-01 DIAGNOSIS — J90 Pleural effusion, not elsewhere classified: Secondary | ICD-10-CM | POA: Diagnosis not present

## 2015-06-01 DIAGNOSIS — C3411 Malignant neoplasm of upper lobe, right bronchus or lung: Secondary | ICD-10-CM | POA: Insufficient documentation

## 2015-06-01 DIAGNOSIS — Z85118 Personal history of other malignant neoplasm of bronchus and lung: Secondary | ICD-10-CM | POA: Diagnosis not present

## 2015-06-01 LAB — COMPREHENSIVE METABOLIC PANEL
ALBUMIN: 3.3 g/dL — AB (ref 3.5–5.0)
ALT: 20 U/L (ref 0–55)
AST: 20 U/L (ref 5–34)
Alkaline Phosphatase: 120 U/L (ref 40–150)
Anion Gap: 11 mEq/L (ref 3–11)
BUN: 25.5 mg/dL (ref 7.0–26.0)
CALCIUM: 9.9 mg/dL (ref 8.4–10.4)
CHLORIDE: 98 meq/L (ref 98–109)
CO2: 31 mEq/L — ABNORMAL HIGH (ref 22–29)
CREATININE: 0.9 mg/dL (ref 0.6–1.1)
EGFR: 65 mL/min/{1.73_m2} — ABNORMAL LOW (ref >90)
GLUCOSE: 99 mg/dL (ref 70–140)
Potassium: 2.9 mEq/L — CL (ref 3.5–5.1)
Sodium: 140 mEq/L (ref 136–145)
Total Bilirubin: 0.62 mg/dL (ref 0.20–1.20)
Total Protein: 7.3 g/dL (ref 6.4–8.3)

## 2015-06-01 LAB — CBC WITH DIFFERENTIAL/PLATELET
BASO%: 0.2 % (ref 0.0–2.0)
Basophils Absolute: 0 10*3/uL (ref 0.0–0.1)
EOS ABS: 0.2 10*3/uL (ref 0.0–0.5)
EOS%: 1.9 % (ref 0.0–7.0)
HCT: 36.2 % (ref 34.8–46.6)
HGB: 11.6 g/dL (ref 11.6–15.9)
LYMPH%: 11.4 % — ABNORMAL LOW (ref 14.0–49.7)
MCH: 26.2 pg (ref 25.1–34.0)
MCHC: 32 g/dL (ref 31.5–36.0)
MCV: 81.7 fL (ref 79.5–101.0)
MONO#: 0.8 10*3/uL (ref 0.1–0.9)
MONO%: 8.9 % (ref 0.0–14.0)
NEUT%: 77.6 % — AB (ref 38.4–76.8)
NEUTROS ABS: 6.8 10*3/uL — AB (ref 1.5–6.5)
Platelets: 274 10*3/uL (ref 145–400)
RBC: 4.43 10*6/uL (ref 3.70–5.45)
RDW: 16.2 % — ABNORMAL HIGH (ref 11.2–14.5)
WBC: 8.8 10*3/uL (ref 3.9–10.3)
lymph#: 1 10*3/uL (ref 0.9–3.3)

## 2015-06-01 MED ORDER — POTASSIUM CHLORIDE CRYS ER 20 MEQ PO TBCR: 40.0000 meq | EXTENDED_RELEASE_TABLET | Freq: Every day | ORAL | Status: DC

## 2015-06-01 MED ORDER — IOHEXOL 300 MG/ML  SOLN
75.0000 mL | Freq: Once | INTRAMUSCULAR | Status: AC | PRN
  Administered 2015-06-01: 75 mL via INTRAVENOUS

## 2015-06-01 NOTE — Progress Notes (Signed)
Quick Note:  Call patient with the result and I ordered K Dur 40 meq po qd x 7 days ______

## 2015-06-02 ENCOUNTER — Telehealth: Payer: Self-pay | Admitting: Medical Oncology

## 2015-06-02 MED ORDER — POTASSIUM CHLORIDE CRYS ER 20 MEQ PO TBCR: 20.0000 meq | EXTENDED_RELEASE_TABLET | Freq: Two times a day (BID) | ORAL | Status: DC

## 2015-06-02 NOTE — Telephone Encounter (Signed)
-----   Message from Curt Bears, MD sent at 06/01/2015  5:34 PM EDT ----- Call patient with the result and I ordered K Dur 40 meq po qd x 7 days

## 2015-06-02 NOTE — Telephone Encounter (Signed)
I left a message for Peggy to tell pt to pick up kdur for low potassium and let me know if she is unable to relay message.

## 2015-06-05 ENCOUNTER — Telehealth: Payer: Self-pay | Admitting: Medical Oncology

## 2015-06-05 ENCOUNTER — Encounter: Payer: Self-pay | Admitting: Medical Oncology

## 2015-06-05 NOTE — Telephone Encounter (Signed)
pharmacy seeking clarification re kdur. I called back and cancelled rx because rx sent to local pharmacy. I called Peggy and pt had received her normal monthly supply of kdur 20 meq daily in the mail . I instructed her to pick up rx at [facility] for 40 meq per day x 7 days  , but she said she will just have Jisselle  take an extra kdur from her [facility] supply and I told her that was fine and cancelled rx at [facility].

## 2015-06-15 ENCOUNTER — Ambulatory Visit
Admission: RE | Admit: 2015-06-15 | Discharge: 2015-06-15 | Disposition: A | Discharge status: Home / Self Care | Payer: Medicare Other | Source: Ambulatory Visit | Attending: Radiation Oncology | Admitting: Radiation Oncology

## 2015-06-15 ENCOUNTER — Ambulatory Visit (HOSPITAL_BASED_OUTPATIENT_CLINIC_OR_DEPARTMENT_OTHER): Payer: Medicare Other | Admitting: Internal Medicine

## 2015-06-15 ENCOUNTER — Encounter: Payer: Self-pay | Admitting: Internal Medicine

## 2015-06-15 ENCOUNTER — Encounter: Payer: Self-pay | Admitting: Radiation Oncology

## 2015-06-15 ENCOUNTER — Telehealth: Payer: Self-pay | Admitting: Internal Medicine

## 2015-06-15 ENCOUNTER — Encounter: Payer: Self-pay | Admitting: *Deleted

## 2015-06-15 VITALS — BP 116/47 | HR 86 | Resp 16 | Wt 130.0 lb

## 2015-06-15 VITALS — BP 120/54 | HR 79 | Temp 98.3°F | Resp 19 | Ht 61.0 in | Wt 130.5 lb

## 2015-06-15 DIAGNOSIS — R5383 Other fatigue: Secondary | ICD-10-CM

## 2015-06-15 DIAGNOSIS — C3411 Malignant neoplasm of upper lobe, right bronchus or lung: Secondary | ICD-10-CM

## 2015-06-15 DIAGNOSIS — R05 Cough: Secondary | ICD-10-CM

## 2015-06-15 DIAGNOSIS — R0602 Shortness of breath: Secondary | ICD-10-CM | POA: Diagnosis not present

## 2015-06-15 DIAGNOSIS — R911 Solitary pulmonary nodule: Secondary | ICD-10-CM

## 2015-06-15 MED ORDER — HYDROCODONE-HOMATROPINE 5-1.5 MG/5ML PO SYRP: 5.0000 mL | ORAL_SOLUTION | Freq: Four times a day (QID) | ORAL | Status: DC | PRN

## 2015-06-15 NOTE — Progress Notes (Signed)
Weight stable. BP low. Daughter plans to discontinue her mother's second dose of Lasix hoping this will raise her BP. Reports she has been more sleepy/fatigue recently. Reports a productive cough with yellow sputum. Reports occasional scant hemoptysis intermittently. Reports she is sore under her right arm and left breast. Denies pain or difficulty associated with swallowing. Reports shortness of breath with exertion. Faint hyperpigmentation without desquamation noted within treatment field.   BP 116/47 mmHg  Pulse 86  Resp 16  Wt 130 lb (58.968 kg)  SpO2 93% Wt Readings from Last 3 Encounters:  06/15/15 130 lb (58.968 kg)  04/06/15 132 lb (59.875 kg)  03/09/15 135 lb 12.8 oz (61.598 kg)

## 2015-06-15 NOTE — Progress Notes (Signed)
Radiation Oncology         ([phone]) [phone] ________________________________  Name: Marissa [name] MRN: [id number]  Date: 06/15/2015  DOB: [birth year]  Follow-Up Visit Note  CC: [name], MD  [name], *  Diagnosis: 82 y.o. 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 Solitary pulmonary nodule 793.11 R91.1 NM PET Image Initial (PI) Skull Base To Thigh  2. Primary cancer of right upper lobe of lung (HCC) 162.3 C34.11 NM PET Image Initial (PI) Skull Base To Thigh    Interval Since Last Radiation: 5 months  12/08/2014-01/23/2015: The patient's recurrent lymph nodes were treated to 66 Gy in 33 fractions of 2 Gy.  02/10/2014, 02/12/2014, 02/17/2014: The target was treated to 54 Gy in 3 fractions of 18 Gy  Narrative:  The patient returns today for routine follow-up. CT of the chest on 06/01/15 revealed a dominant area of consolidation in the right upper lobe measuring 4.3 x 4.7 cm. There is a new medial right lower lobe nodule measuring 0.9 cm that is suspicious for disease progression. There is also a small right pleural effusion, volume loss in the left lower lobe, and calcified granuloma in the subpleural left upper lobe.  The patient reports she has been more drowsy/fatigued recently, a productive cough with yellow sputum, and occasional scant hemoptysis. She is sore under her right arm and left breast.   ALLERGIES:  is allergic to coumadin and prozac.  Meds: Current Outpatient Prescriptions  Medication Sig Dispense Refill  . acetaminophen (TYLENOL) 325 MG tablet Take 2 tablets (650 mg total) by mouth every 6 (six) hours as needed for mild pain (or Fever >/= 101).    [name] albuterol (PROVENTIL) (2.5 MG/3ML) 0.083% nebulizer solution     . alendronate (FOSAMAX) 70 MG tablet Take 70 mg by mouth once a week.     [name] aspirin EC 325 MG tablet Take 1 tablet (325 mg total) by mouth 2  (two) times daily. 30 tablet 0  . atorvastatin (LIPITOR) 40 MG tablet Take 40 mg by mouth daily. For HLD    . CALCIUM CARBONATE PO Take 1 tablet by mouth 2 (two) times daily.    [name] docusate sodium (COLACE) 100 MG capsule Take 1 capsule (100 mg total) by mouth 2 (two) times daily. 10 capsule 0  . esomeprazole (NEXIUM) 40 MG capsule Take 40 mg by mouth daily as needed (for acid reflux).     . fluticasone-salmeterol (ADVAIR HFA) 115-21 MCG/ACT inhaler Inhale 2 puffs into the lungs 2 (two) times daily.     . furosemide (LASIX) 40 MG tablet Take 20-40 mg by mouth 2 (two) times daily. Take 40 mg by mouth in the morning and take 20 mg by mouth in the evening for fluid.    [name] glimepiride (AMARYL) 1 MG tablet Take 1 mg by mouth daily with breakfast.    . Ipratropium-Albuterol (COMBIVENT) 20-100 MCG/ACT AERS respimat Inhale 1 puff into the lungs every 6 (six) hours as needed for wheezing or shortness of breath.     [name] ipratropium-albuterol (DUONEB) 0.5-2.5 (3) MG/3ML SOLN Take 3 mLs by nebulization every 6 (six) hours as needed (for shortness of breath).     [name] levothyroxine (SYNTHROID, LEVOTHROID) 100 MCG tablet Take 100 mcg by mouth daily before breakfast.     . metoprolol succinate (TOPROL-XL) 50 MG 24 hr tablet Take 1 tablet (50 mg  total) by mouth every evening. 90 tablet 3  . potassium chloride SA (K-DUR,KLOR-CON) 20 MEQ tablet Take 2 tablets (40 mEq total) by mouth daily. Then back to one tab daily 7 tablet 0  . potassium chloride SA (K-DUR,KLOR-CON) 20 MEQ tablet Take 20 mEq by mouth daily.    [name] rOPINIRole (REQUIP) 4 MG tablet Take 4 mg by mouth at bedtime.    . senna-docusate (SENOKOT-S) 8.6-50 MG tablet Take 1 tablet by mouth at bedtime as needed for mild constipation.    . vitamin C (ASCORBIC ACID) 500 MG tablet Take 500 mg by mouth daily.     No current facility-administered medications for this encounter.    Physical Findings: The patient is in no acute distress. Patient is alert and oriented.   weight is 130 lb (58.968 kg). Her blood pressure is 116/47 and her pulse is 86. Her respiration is 16 and oxygen saturation is 93%.   The patient presents to the clinic in a wheelchair. In no respiratory distress.  Lab Findings: Lab Results  Component Value Date   WBC 8.8 06/01/2015   WBC 8.4 12/29/2014   WBC 15.3* 04/17/2014   HGB 11.6 06/01/2015   HGB 8.5* 12/29/2014   HGB 13.5 04/17/2014   HCT 36.2 06/01/2015   HCT 26.9* 12/29/2014   HCT 40.6 04/17/2014   PLT 274 06/01/2015   PLT 161 12/29/2014   PLT 168 04/17/2014    Lab Results  Component Value Date   NA 140 06/01/2015   NA 133* 12/29/2014   NA 137 04/17/2014   K 2.9* 06/01/2015   K 4.4 12/29/2014   K 2.8* 04/18/2014   CHLORIDE 98 06/01/2015   CO2 31* 06/01/2015   CO2 31 12/29/2014   CO2 30 04/17/2014   GLUCOSE 99 06/01/2015   GLUCOSE 145* 12/29/2014   GLUCOSE 244* 04/17/2014   BUN 25.5 06/01/2015   BUN 17 12/29/2014   BUN 19* 04/17/2014   CREATININE 0.9 06/01/2015   CREATININE 0.84 12/29/2014   CREATININE 1.08 04/17/2014   BILITOT 0.62 06/01/2015   BILITOT 1.0 12/26/2014   ALKPHOS 120 06/01/2015   ALKPHOS 108 12/26/2014   AST 20 06/01/2015   AST 159* 12/26/2014   ALT 20 06/01/2015   ALT 170* 12/26/2014   PROT 7.3 06/01/2015   PROT 5.4* 12/26/2014   ALBUMIN 3.3* 06/01/2015   ALBUMIN 2.7* 12/26/2014   CALCIUM 9.9 06/01/2015   CALCIUM 8.3* 12/29/2014   CALCIUM 9.0 04/17/2014   ANIONGAP 11 06/01/2015   ANIONGAP 9 12/29/2014   ANIONGAP 9 04/17/2014    Radiographic Findings: Ct Chest W Contrast  06/01/2015  CLINICAL DATA:  Right upper lobe cancer, radiation therapy complete. Soreness under right arm and right breast. EXAM: CT CHEST WITH CONTRAST TECHNIQUE: Multidetector CT imaging of the chest was performed during intravenous contrast administration. CONTRAST:  50m OMNIPAQUE IOHEXOL 300 MG/ML  SOLN COMPARISON:  02/23/2015. FINDINGS: Mediastinum/Nodes: No pathologically enlarged mediastinal, hilar or  axillary lymph nodes. Atherosclerotic calcification of the arterial vasculature. Heart size is within normal limits but the left atrium is dilated. No pericardial effusion. Pre pericardiac lymph nodes are sub cm in short axis size. Lungs/Pleura: Patchy consolidation, bronchiectasis and architectural distortion have a slightly more organized appearance in in the right upper and superior segment right lower lobes. Dominant area of consolidation in the right upper lobe measures approximately 4.3 x 4.7 cm (5/15), previously 6.7 x 7.8 cm. Medial right lower lobe nodule is new, measuring 9 mm (5/37). Small right pleural effusion,  partially loculated but overall decreased. Volume loss in the left lower lobe, as before. Calcified granuloma in the subpleural left upper lobe. Airway is otherwise unremarkable. Upper abdomen: Visualized portions of the liver, adrenal glands, kidneys, spleen, pancreas and stomach are grossly unremarkable. Cholecystectomy. No upper abdominal adenopathy. Musculoskeletal: Degenerative changes are seen throughout the spine. No worrisome lytic or sclerotic lesions. Degenerative and postoperative changes in the right shoulder. Healing/healed right lateral third rib fracture. IMPRESSION: 1. New medial right lower lobe nodule is highly worrisome for disease progression. 2. Patchy consolidation, bronchiectasis and architectural distortion in the upper right hemi thorax with a slightly more organized appearance than on 02/23/2015 suggesting evolutionary changes of radiation therapy. Underlying residual tumor is not excluded. 3. Small partially loculated right pleural effusion, smaller. 4. Stable volume loss in the left lower lobe. Electronically Signed   By: Lorin Picket M.D.   On: 06/01/2015 13:40    Impression:  The patient is recovering from the effects of radiation. CT shows radiation fibrosis in treated RUL with new nodule in RLL.  It appears that the patient appears to have a post-radiation  right rib fracture, on CT, that could explain the pain under her right arm. A PET scan may be appropriate to determine if the new right lower lobe nodule is a new primary tumor.  If so, it may be amenable to SBRT.  Plan: The patient will follow up with Dr. Julien Nordmann on 06/15/15.  Will order PET and plan to discuss in [place]. _____________________________________  Sheral Apley. Tammi Klippel, M.D.  This document serves as a record of services personally performed by Tyler Pita, MD. It was created on his behalf by Darcus Austin, a trained medical scribe. The creation of this record is based on the scribe's personal observations and the provider's statements to them. This document has been checked and approved by the attending provider.

## 2015-06-15 NOTE — Telephone Encounter (Signed)
Gave and printed appt sched and avs for pt for June °

## 2015-06-15 NOTE — Progress Notes (Signed)
[facility] [phone]   [phone]  OFFICE PROGRESS NOTE  Marissa Huh, MD [address] [place] [address]  DIAGNOSIS: Stage IIIA (T2a, N2, M0) non-small cell lung cancer, squamous cell carcinoma presented with recurrence on the right upper lobe in addition to mediastinal lymphadenopathy diagnosed in September 2016.  PRIOR THERAPY: Concurrent chemoradiation with weekly carboplatin for AUC of 2 and paclitaxel 45 MG/M2. S/P 6 cycles.  CURRENT THERAPY: Observation.  INTERVAL HISTORY: Marissa [name] 82 y.o. female returns to the clinic today for follow-up visit accompanied by her daughter. The patient is feeling much better today after completing a course of concurrent chemoradiation. She has been on observation for the last few months. She denied having any significant dysphagia or odynophagia. She denied having any significant nausea or vomiting. She has no fever or chills. She denied having any significant weight loss or night sweats. She had repeat CT scan of the chest performed recently and she is here for evaluation and discussion of her scan results.  MEDICAL HISTORY: Past Medical History  Diagnosis Date  . HTN (hypertension)   . Hypercholesteremia   . CAD (coronary artery disease)     s/p CABG 03/2011  . Aortic valve disorders   . RLS (restless legs syndrome)   . A-fib ([place])   . Osteopenia   . Mitral valve disease     s/p MVR 03/2011 w/ 27 mm porcine valve  . Hypothyroidism   . COPD (chronic obstructive pulmonary disease) ([place])   . CHF (congestive heart failure) ([place])   . Aortic stenosis     (s/p AVR w/ 21 mm porcine valve 03/2011  . Mastoiditis   . Myocardial infarction ([place])     "slight"  . Dysrhythmia   . Shortness of breath     with exertion  . Pre-diabetes   . GERD (gastroesophageal reflux disease)     takes nexium prn  . Arthritis   . History of blood transfusion     Rheumatic Fever  . Lung  cancer ([place])   . Pneumonia   . Diabetes mellitus without complication ([place])   . Full code status 12/22/2014  . Full code status 12/22/2014    ALLERGIES:  is allergic to coumadin and prozac.  MEDICATIONS:  Current Outpatient Prescriptions  Medication Sig Dispense Refill  . acetaminophen (TYLENOL) 325 MG tablet Take 2 tablets (650 mg total) by mouth every 6 (six) hours as needed for mild pain (or Fever >/= 101).    [name] albuterol (PROVENTIL) (2.5 MG/3ML) 0.083% nebulizer solution     . alendronate (FOSAMAX) 70 MG tablet Take 70 mg by mouth once a week.     [name] aspirin EC 325 MG tablet Take 1 tablet (325 mg total) by mouth 2 (two) times daily. 30 tablet 0  . atorvastatin (LIPITOR) 40 MG tablet Take 40 mg by mouth daily. For HLD    . CALCIUM CARBONATE PO Take 1 tablet by mouth 2 (two) times daily.    [name] docusate sodium (COLACE) 100 MG capsule Take 1 capsule (100 mg total) by mouth 2 (two) times daily. 10 capsule 0  . esomeprazole (NEXIUM) 40 MG capsule Take 40 mg by mouth daily as needed (for acid reflux).     . fluticasone-salmeterol (ADVAIR HFA) 115-21 MCG/ACT inhaler Inhale 2 puffs into the lungs 2 (two) times daily.     . furosemide (LASIX) 40 MG tablet Take 20-40 mg by mouth 2 (two)  times daily. Take 40 mg by mouth in the morning and take 20 mg by mouth in the evening for fluid.    [name] glimepiride (AMARYL) 1 MG tablet Take 1 mg by mouth daily with breakfast.    . Ipratropium-Albuterol (COMBIVENT) 20-100 MCG/ACT AERS respimat Inhale 1 puff into the lungs every 6 (six) hours as needed for wheezing or shortness of breath.     [name] ipratropium-albuterol (DUONEB) 0.5-2.5 (3) MG/3ML SOLN Take 3 mLs by nebulization every 6 (six) hours as needed (for shortness of breath).     [name] levothyroxine (SYNTHROID, LEVOTHROID) 100 MCG tablet Take 100 mcg by mouth daily before breakfast.     . metoprolol succinate (TOPROL-XL) 50 MG 24 hr tablet Take 1 tablet (50 mg total) by mouth every evening. 90 tablet 3  . potassium  chloride SA (K-DUR,KLOR-CON) 20 MEQ tablet Take 2 tablets (40 mEq total) by mouth daily. Then back to one tab daily 7 tablet 0  . potassium chloride SA (K-DUR,KLOR-CON) 20 MEQ tablet Take 20 mEq by mouth daily.    [name] rOPINIRole (REQUIP) 4 MG tablet Take 4 mg by mouth at bedtime.    . senna-docusate (SENOKOT-S) 8.6-50 MG tablet Take 1 tablet by mouth at bedtime as needed for mild constipation.    . vitamin C (ASCORBIC ACID) 500 MG tablet Take 500 mg by mouth daily.     No current facility-administered medications for this visit.    SURGICAL HISTORY:  Past Surgical History  Procedure Laterality Date  . Mitral valve replacement      1990's  . Laparoscopic cholecystectomy    . Aortic valve replacement    . Tonsillectomy    . Coronary artery bypass graft  03/2011    1990's also  . Video bronchoscopy with endobronchial navigation N/A 01/06/2014    Procedure: VIDEO BRONCHOSCOPY WITH ENDOBRONCHIAL NAVIGATION;  Surgeon: [name], MD;  Location: [facility];  Service: Thoracic;  Laterality: N/A;  . Video bronchoscopy with endobronchial ultrasound N/A 01/06/2014    Procedure: VIDEO BRONCHOSCOPY WITH ENDOBRONCHIAL ULTRASOUND;  Surgeon: [name], MD;  Location: [facility];  Service: Thoracic;  Laterality: N/A;  . Video bronchoscopy with endobronchial navigation N/A 11/21/2014    Procedure: VIDEO BRONCHOSCOPY WITH ENDOBRONCHIAL NAVIGATION;  Surgeon: [name], MD;  Location: [facility];  Service: Thoracic;  Laterality: N/A;  . Video bronchoscopy with endobronchial ultrasound N/A 11/21/2014    Procedure: VIDEO BRONCHOSCOPY WITH ENDOBRONCHIAL ULTRASOUND;  Surgeon: [name], MD;  Location: [facility];  Service: Thoracic;  Laterality: N/A;  . Compression hip screw Left 12/26/2014    Procedure: ORIF LEFT HIP INTERTROCHANTRIC FRACTURE;  Surgeon: Frederik Pear, MD;  Location: [facility];  Service: Orthopedics;  Laterality: Left;    REVIEW OF SYSTEMS:  A comprehensive review of systems was  negative except for: Respiratory: positive for cough   PHYSICAL EXAMINATION: General appearance: alert, cooperative, fatigued and no distress Head: Normocephalic, without obvious abnormality, atraumatic Neck: no adenopathy, no JVD, supple, symmetrical, trachea midline and thyroid not enlarged, symmetric, no tenderness/mass/nodules Lymph nodes: Cervical, supraclavicular, and axillary nodes normal. Resp: clear to auscultation bilaterally Back: symmetric, no curvature. ROM normal. No CVA tenderness. Cardio: regular rate and rhythm, S1, S2 normal, no murmur, click, rub or gallop GI: soft, non-tender; bowel sounds normal; no masses,  no organomegaly Extremities: extremities normal, atraumatic, no cyanosis or edema Neurologic: Alert and oriented X 3, normal strength and tone. Normal symmetric reflexes. Normal coordination and gait  ECOG PERFORMANCE STATUS: 1 -  Symptomatic but completely ambulatory  There were no vitals taken for this visit.  LABORATORY DATA: Lab Results  Component Value Date   WBC 8.8 06/01/2015   HGB 11.6 06/01/2015   HCT 36.2 06/01/2015   MCV 81.7 06/01/2015   PLT 274 06/01/2015      Chemistry      Component Value Date/Time   NA 140 06/01/2015 0938   NA 133* 12/29/2014 0010   NA 137 04/17/2014 0409   K 2.9* 06/01/2015 0938   K 4.4 12/29/2014 0010   K 2.8* 04/18/2014 0422   CL 93* 12/29/2014 0010   CL 98 04/17/2014 0409   CO2 31* 06/01/2015 0938   CO2 31 12/29/2014 0010   CO2 30 04/17/2014 0409   BUN 25.5 06/01/2015 0938   BUN 17 12/29/2014 0010   BUN 19* 04/17/2014 0409   CREATININE 0.9 06/01/2015 0938   CREATININE 0.84 12/29/2014 0010   CREATININE 1.08 04/17/2014 0409      Component Value Date/Time   CALCIUM 9.9 06/01/2015 0938   CALCIUM 8.3* 12/29/2014 0010   CALCIUM 9.0 04/17/2014 0409   ALKPHOS 120 06/01/2015 0938   ALKPHOS 108 12/26/2014 1450   AST 20 06/01/2015 0938   AST 159* 12/26/2014 1450   ALT 20 06/01/2015 0938   ALT 170* 12/26/2014  1450   BILITOT 0.62 06/01/2015 0938   BILITOT 1.0 12/26/2014 1450       RADIOGRAPHIC STUDIES: Ct Chest W Contrast  06/01/2015  CLINICAL DATA:  Right upper lobe cancer, radiation therapy complete. Soreness under right arm and right breast. EXAM: CT CHEST WITH CONTRAST TECHNIQUE: Multidetector CT imaging of the chest was performed during intravenous contrast administration. CONTRAST:  16m OMNIPAQUE IOHEXOL 300 MG/ML  SOLN COMPARISON:  02/23/2015. FINDINGS: Mediastinum/Nodes: No pathologically enlarged mediastinal, hilar or axillary lymph nodes. Atherosclerotic calcification of the arterial vasculature. Heart size is within normal limits but the left atrium is dilated. No pericardial effusion. Pre pericardiac lymph nodes are sub cm in short axis size. Lungs/Pleura: Patchy consolidation, bronchiectasis and architectural distortion have a slightly more organized appearance in in the right upper and superior segment right lower lobes. Dominant area of consolidation in the right upper lobe measures approximately 4.3 x 4.7 cm (5/15), previously 6.7 x 7.8 cm. Medial right lower lobe nodule is new, measuring 9 mm (5/37). Small right pleural effusion, partially loculated but overall decreased. Volume loss in the left lower lobe, as before. Calcified granuloma in the subpleural left upper lobe. Airway is otherwise unremarkable. Upper abdomen: Visualized portions of the liver, adrenal glands, kidneys, spleen, pancreas and stomach are grossly unremarkable. Cholecystectomy. No upper abdominal adenopathy. Musculoskeletal: Degenerative changes are seen throughout the spine. No worrisome lytic or sclerotic lesions. Degenerative and postoperative changes in the right shoulder. Healing/healed right lateral third rib fracture. IMPRESSION: 1. New medial right lower lobe nodule is highly worrisome for disease progression. 2. Patchy consolidation, bronchiectasis and architectural distortion in the upper right hemi thorax with a  slightly more organized appearance than on 02/23/2015 suggesting evolutionary changes of radiation therapy. Underlying residual tumor is not excluded. 3. Small partially loculated right pleural effusion, smaller. 4. Stable volume loss in the left lower lobe. Electronically Signed   By: MLorin PicketM.D.   On: 06/01/2015 13:40    ASSESSMENT AND PLAN: This is a very pleasant 82years old white female with recurrent non-small cell lung cancer presented as a stage IIIa, squamous cell carcinoma. The patient completed a course of concurrent chemoradiation with weekly carboplatin  and paclitaxel. She status post 6 cycles. She is tolerating her treatment well. The patient continues to have mild fatigue as well as shortness breath and cough productive of yellowish sputum. The recent CT scan of the chest showed improvement of the right upper lobe lung mass and consolidation but the patient has medial right lower lobe nodule worrisome for disease progression. I discussed the scan results with the patient and her daughter. I gave her several options for treatment of her condition including stereotactic radiotherapy under the care of Dr. Tammi Klippel followed by restaging scan in 3 months versus consideration of systemic chemotherapy or immunotherapy. The patient and her daughter are interested in proceeding with the stereotactic body radiotherapy. They will contact Dr. Tammi Klippel for reevaluation and discussion of this option. If she has any further evidence for disease progression, I will discuss with the patient's systemic treatment options. The patient would come back for follow-up visit in 3 months for evaluation after repeating CT scan of the chest for restaging of her disease. The patient was advised to call immediately if she has any concerning symptoms in the interval. The patient voices understanding of current disease status and treatment options and is in agreement with the current care plan.  All questions  were answered. The patient knows to call the clinic with any problems, questions or concerns. We can certainly see the patient much sooner if necessary.  Disclaimer: This note was dictated with voice recognition software. Similar sounding words can inadvertently be transcribed and may not be corrected upon review.

## 2015-06-15 NOTE — Progress Notes (Signed)
Oncology Nurse Navigator Documentation  Oncology Nurse Navigator Flowsheets 06/15/2015  Navigator Encounter Type Clinic/[facility]C  Patient Visit Type -  Treatment Phase Treatment  Barriers/Navigation Needs Education  Education Other  Interventions Education Method  Coordination of Care -  Education Method Verbal  Acuity Level 1  Acuity Level 1 Minimal follow up required  Time Spent with Patient 79   Spoke with patient and daughter today.  Dr. Julien Nordmann went over next steps.  Patient was given a prescription for cough medication.  I explained side effects of medication and to take as prescribed.

## 2015-07-06 ENCOUNTER — Encounter (HOSPITAL_COMMUNITY)
Admission: RE | Admit: 2015-07-06 | Discharge: 2015-07-06 | Disposition: A | Discharge status: Home / Self Care | Payer: Medicare Other | Source: Ambulatory Visit | Attending: Radiation Oncology | Admitting: Radiation Oncology

## 2015-07-06 DIAGNOSIS — C3411 Malignant neoplasm of upper lobe, right bronchus or lung: Secondary | ICD-10-CM | POA: Diagnosis not present

## 2015-07-06 DIAGNOSIS — R911 Solitary pulmonary nodule: Secondary | ICD-10-CM | POA: Insufficient documentation

## 2015-07-06 DIAGNOSIS — C3491 Malignant neoplasm of unspecified part of right bronchus or lung: Secondary | ICD-10-CM | POA: Diagnosis not present

## 2015-07-06 LAB — GLUCOSE, CAPILLARY: Glucose-Capillary: 92 mg/dL (ref 65–99)

## 2015-07-06 MED ORDER — FLUDEOXYGLUCOSE F - 18 (FDG) INJECTION
6.9900 | Freq: Once | INTRAVENOUS | Status: AC | PRN
  Administered 2015-07-06: 6.99 via INTRAVENOUS

## 2015-07-27 ENCOUNTER — Ambulatory Visit: Admission: RE | Admit: 2015-07-27 | Payer: Medicare Other | Source: Ambulatory Visit

## 2015-08-03 ENCOUNTER — Encounter: Payer: Self-pay | Admitting: Radiation Oncology

## 2015-08-03 ENCOUNTER — Ambulatory Visit
Admission: RE | Admit: 2015-08-03 | Discharge: 2015-08-03 | Disposition: A | Discharge status: Home / Self Care | Payer: Medicare Other | Source: Ambulatory Visit | Attending: Radiation Oncology | Admitting: Radiation Oncology

## 2015-08-03 DIAGNOSIS — C3431 Malignant neoplasm of lower lobe, right bronchus or lung: Secondary | ICD-10-CM | POA: Diagnosis not present

## 2015-08-03 NOTE — Progress Notes (Signed)
Weight stable. Diastolic bp low. Denies feeling light headed, dizzy or weak. Reports taking BP medication prior to this visit without food. Reports a productive cough with yellow tinged sputum. Shortness of breath with exertion. Denies pain or difficulty swallowing. Wearing oxygen therapy at home. Reports she fatigues easily.   BP 112/48 mmHg  Pulse 82  Resp 16  Wt 132 lb 8 oz (60.102 kg)  SpO2 92% Wt Readings from Last 3 Encounters:  08/03/15 132 lb 8 oz (60.102 kg)  06/15/15 130 lb 8 oz (59.194 kg)  06/15/15 130 lb (58.968 kg)

## 2015-08-03 NOTE — Progress Notes (Signed)
Radiation Oncology         [phone] ________________________________  Name: Marissa [name] MRN: [id number]  Date: 08/03/2015  DOB: 25-Jul-1933  Follow-Up Visit Note  CC: [name], MD  [name], *  Diagnosis: 82 y.o. 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 Cancer of lower lobe of right lung (HCC) 162.5 C34.31     Interval Since Last Radiation: 5 months  12/08/2014-01/23/2015: The patient's recurrent lymph nodes were treated to 66 Gy in 33 fractions of 2 Gy.  02/10/2014, 02/12/2014, 02/17/2014: The target was treated to 54 Gy in 3 fractions of 18 Gy  Narrative:  The patient returns today for routine follow-up. PET scan on 07/06/15 shows an enlarging RLL nodule demonstrating hypermetabolism that is concerning for either a solitary pulmonary metastasis or a new primary lung neoplasm.  On review of systems, the patient reports that she is doing well overall. She denies any chest pain. She does have shortness of breath with exertion and a productive cough with yellow sputum. She wears oxygen at home, but refuses to take it out in public due to it being cumbersome. She denies fevers, chills, night sweats, unintended weight changes. She denies any bowel or bladder disturbances, and denies abdominal pain, nausea or vomiting. She denies any new musculoskeletal or joint aches or pains. She denies feeling light headed or dizzy. A complete review of systems is obtained and is otherwise negative.  ALLERGIES:  is allergic to coumadin and prozac.  Meds: Current Outpatient Prescriptions  Medication Sig Dispense Refill  . acetaminophen (TYLENOL) 325 MG tablet Take 2 tablets (650 mg total) by mouth every 6 (six) hours as needed for mild pain (or Fever >/= 101).    [name] albuterol (PROVENTIL) (2.5 MG/3ML) 0.083% nebulizer solution     . alendronate (FOSAMAX) 70 MG tablet Take 70 mg by  mouth once a week.     [name] aspirin EC 325 MG tablet Take 1 tablet (325 mg total) by mouth 2 (two) times daily. 30 tablet 0  . atorvastatin (LIPITOR) 40 MG tablet Take 40 mg by mouth daily. For HLD    . CALCIUM CARBONATE PO Take 1 tablet by mouth 2 (two) times daily.    [name] docusate sodium (COLACE) 100 MG capsule Take 1 capsule (100 mg total) by mouth 2 (two) times daily. 10 capsule 0  . esomeprazole (NEXIUM) 40 MG capsule Take 40 mg by mouth daily as needed (for acid reflux).     . fluticasone-salmeterol (ADVAIR HFA) 115-21 MCG/ACT inhaler Inhale 2 puffs into the lungs 2 (two) times daily.     . furosemide (LASIX) 40 MG tablet Take 20-40 mg by mouth 2 (two) times daily. Take 40 mg by mouth in the morning and take 20 mg by mouth in the evening for fluid.    [name] glimepiride (AMARYL) 1 MG tablet Take 1 mg by mouth daily with breakfast.    . HYDROcodone-homatropine (HYCODAN) 5-1.5 MG/5ML syrup Take 5 mLs by mouth every 6 (six) hours as needed for cough. 120 mL 0  . Ipratropium-Albuterol (COMBIVENT) 20-100 MCG/ACT AERS respimat Inhale 1 puff into the lungs every 6 (six) hours as needed for wheezing or shortness of breath.     [name] ipratropium-albuterol (DUONEB) 0.5-2.5 (3) MG/3ML SOLN Take 3 mLs by nebulization every 6 (six) hours as needed (for shortness of breath).     [name]  levothyroxine (SYNTHROID, LEVOTHROID) 100 MCG tablet Take 100 mcg by mouth daily before breakfast.     . metoprolol succinate (TOPROL-XL) 50 MG 24 hr tablet Take 1 tablet (50 mg total) by mouth every evening. 90 tablet 3  . potassium chloride SA (K-DUR,KLOR-CON) 20 MEQ tablet Take 20 mEq by mouth daily.    [name] rOPINIRole (REQUIP) 4 MG tablet Take 4 mg by mouth at bedtime.    . senna-docusate (SENOKOT-S) 8.6-50 MG tablet Take 1 tablet by mouth at bedtime as needed for mild constipation.    . vitamin C (ASCORBIC ACID) 500 MG tablet Take 500 mg by mouth daily.     No current facility-administered medications for this encounter.    Physical  Findings:  weight is 132 lb 8 oz (60.102 kg). Her blood pressure is 112/48 and her pulse is 82. Her respiration is 16 and oxygen saturation is 92%.   In general this is a well appearing Caucasian female in no acute distress. She is alert and oriented x4 and appropriate throughout the examination. HEENT reveals that the patient is normocephalic, atraumatic. EOMs are intact. PERRLA. Skin is intact without any evidence of gross lesions. Cardiovascular exam reveals a regular rate and rhythm, no clicks rubs or murmurs are auscultated. Chest is clear to auscultation bilaterally. Lymphatic assessment is performed and does not reveal any adenopathy in the cervical, supraclavicular, axillary, or inguinal chains. Abdomen has active bowel sounds in all quadrants and is intact. The abdomen is soft, non tender, non distended. Lower extremities are negative for pretibial pitting edema, deep calf tenderness, cyanosis or clubbing. She presents to the clinic in a wheelchair.  Lab Findings: Lab Results  Component Value Date   WBC 8.8 06/01/2015   WBC 8.4 12/29/2014   WBC 15.3* 04/17/2014   HGB 11.6 06/01/2015   HGB 8.5* 12/29/2014   HGB 13.5 04/17/2014   HCT 36.2 06/01/2015   HCT 26.9* 12/29/2014   HCT 40.6 04/17/2014   PLT 274 06/01/2015   PLT 161 12/29/2014   PLT 168 04/17/2014    Lab Results  Component Value Date   NA 140 06/01/2015   NA 133* 12/29/2014   NA 137 04/17/2014   K 2.9* 06/01/2015   K 4.4 12/29/2014   K 2.8* 04/18/2014   CHLORIDE 98 06/01/2015   CO2 31* 06/01/2015   CO2 31 12/29/2014   CO2 30 04/17/2014   GLUCOSE 99 06/01/2015   GLUCOSE 145* 12/29/2014   GLUCOSE 244* 04/17/2014   BUN 25.5 06/01/2015   BUN 17 12/29/2014   BUN 19* 04/17/2014   CREATININE 0.9 06/01/2015   CREATININE 0.84 12/29/2014   CREATININE 1.08 04/17/2014   BILITOT 0.62 06/01/2015   BILITOT 1.0 12/26/2014   ALKPHOS 120 06/01/2015   ALKPHOS 108 12/26/2014   AST 20 06/01/2015   AST 159* 12/26/2014   ALT  20 06/01/2015   ALT 170* 12/26/2014   PROT 7.3 06/01/2015   PROT 5.4* 12/26/2014   ALBUMIN 3.3* 06/01/2015   ALBUMIN 2.7* 12/26/2014   CALCIUM 9.9 06/01/2015   CALCIUM 8.3* 12/29/2014   CALCIUM 9.0 04/17/2014   ANIONGAP 11 06/01/2015   ANIONGAP 9 12/29/2014   ANIONGAP 9 04/17/2014    Radiographic Findings: Nm Pet Image Initial (pi) Skull Base To Thigh  07/06/2015  CLINICAL DATA:  Subsequent treatment strategy for right upper lobe lung cancer. Restaging examination. EXAM: NUCLEAR MEDICINE PET SKULL BASE TO THIGH TECHNIQUE: 6.99 mCi F-18 FDG was injected intravenously. Full-ring PET imaging was performed from the skull  base to thigh after the radiotracer. CT data was obtained and used for attenuation correction and anatomic localization. FASTING BLOOD GLUCOSE:  Value: 92 mg/dl COMPARISON:  PET-CT 11/10/2014.  Chest CT 06/01/2015. FINDINGS: NECK No hypermetabolic lymph nodes in the neck. CHEST The nodule of concern on the recent chest CT continues to enlarge, currently measuring 12 x 11 mm, and is hypermetabolic (SUVmax = 6.3). There continues to be extensive mass-like architectural distortion throughout the right upper lobe at site of prior radiation therapy, which demonstrates some low-level metabolic activity (SUVmax = 4.6); the overall appearance of this region is very similar to prior examinations, and favored to represent evolving mass-like fibrosis, and the residual activity in this region is presumably related to the relatively short interval between the last radiation therapy (approximately 6 months). The newer areas of architectural distortion more posteriorly in the right upper lobe and in the adjacent superior segment of the right lower lobe do not appear as hypermetabolic and are not concerning. Small right pleural effusion layering dependently. Linear scarring in the left lower lobe is again noted. No hypermetabolic mediastinal or hilar nodes. Heart size is mildly enlarged with a severe  left atrial dilatation. There is no significant pericardial fluid, thickening or pericardial calcification. There is atherosclerosis of the thoracic aorta, the great vessels of the mediastinum and the coronary arteries, including calcified atherosclerotic plaque in the left main, left anterior descending, left circumflex and right coronary arteries. Status post median sternotomy for CABG, including LIMA to the LAD. Patient is also had aortic and mitral valve replacement with a stented bio prosthesis in both positions. ABDOMEN/PELVIS No abnormal hypermetabolic activity within the liver, pancreas, adrenal glands, or spleen. No hypermetabolic lymph nodes in the abdomen or pelvis. Status post cholecystectomy. Exophytic 3.5 cm low-attenuation lesion in the lower pole of the right kidney is incompletely characterized on today's noncontrast CT examination, but demonstrates no hypermetabolism and is presumably a cyst. Extensive atherosclerosis throughout the abdominal and pelvic vasculature, without evidence of aneurysm. No significant volume of ascites. No pneumoperitoneum. Scattered colonic diverticulae are noted, most evident in the sigmoid colon, without surrounding inflammatory changes to suggest an acute diverticulitis at this time. Postoperative changes of lymph node dissection are noted along the pelvic sidewall bilaterally. Status post hysterectomy. Ovaries are not confidently identified may be surgically absent or atrophic. SKELETON Patchy areas of hypermetabolism throughout the skeletal musculature, presumably physiologic. No focal hypermetabolic activity to suggest skeletal metastasis. IMPRESSION: 1. Enlarging right lower lobe pulmonary nodule demonstrates hypermetabolism, concerning for either a solitary pulmonary metastasis or a new primary lung neoplasm. 2. Evolving postradiation changes in the right upper lung, as above. 3. Small right pleural effusion. No pleural hypermetabolism to suggest a malignant  pleural effusion at this time. 4. Cardiomegaly with left atrial dilatation. 5. Atherosclerosis, including left main and 3 vessel coronary artery disease. Status post median sternotomy for CABG, including LIMA to the LAD. 6. Additional incidental findings, as above. Electronically Signed   By: Vinnie Langton M.D.   On: 07/06/2015 08:52    Impression:  The patient is recovering from the effects of radiation. PET scan on 07/06/15 confirmed hypermetabolic activity of the RLL pulmonary nodule that points towards a solitary metastasis or a new primary lung neoplasm. It would be appropriate to treat this area with SBRT.  Plan: The patient agrees with this course of treatment. CT simulation will be scheduled on 08/15/15. _____________________________________  Carola Rhine, PAC  This document serves as a record of services personally  performed by Shona Simpson, PA-C and Tyler Pita, MD. It was created on their behalf by Darcus Austin, a trained medical scribe. The creation of this record is based on the scribe's personal observations and the provider's statements to them. This document has been checked and approved by the attending provider.

## 2015-08-10 ENCOUNTER — Ambulatory Visit
Admission: RE | Admit: 2015-08-10 | Discharge: 2015-08-10 | Disposition: A | Discharge status: Home / Self Care | Payer: Medicare Other | Source: Ambulatory Visit | Attending: Radiation Oncology | Admitting: Radiation Oncology

## 2015-08-10 DIAGNOSIS — Z510 Encounter for antineoplastic radiation therapy: Secondary | ICD-10-CM | POA: Diagnosis not present

## 2015-08-10 DIAGNOSIS — C3411 Malignant neoplasm of upper lobe, right bronchus or lung: Secondary | ICD-10-CM | POA: Insufficient documentation

## 2015-08-23 ENCOUNTER — Emergency Department
Admission: EM | Admit: 2015-08-23 | Discharge: 2015-08-23 | Disposition: A | Discharge status: Home / Self Care | Payer: Medicare Other | Attending: Emergency Medicine | Admitting: Emergency Medicine

## 2015-08-23 DIAGNOSIS — I252 Old myocardial infarction: Secondary | ICD-10-CM | POA: Insufficient documentation

## 2015-08-23 DIAGNOSIS — Z87891 Personal history of nicotine dependence: Secondary | ICD-10-CM | POA: Diagnosis not present

## 2015-08-23 DIAGNOSIS — I4891 Unspecified atrial fibrillation: Secondary | ICD-10-CM | POA: Insufficient documentation

## 2015-08-23 DIAGNOSIS — I509 Heart failure, unspecified: Secondary | ICD-10-CM | POA: Insufficient documentation

## 2015-08-23 DIAGNOSIS — R040 Epistaxis: Secondary | ICD-10-CM | POA: Diagnosis not present

## 2015-08-23 DIAGNOSIS — Z951 Presence of aortocoronary bypass graft: Secondary | ICD-10-CM | POA: Diagnosis not present

## 2015-08-23 DIAGNOSIS — E119 Type 2 diabetes mellitus without complications: Secondary | ICD-10-CM | POA: Insufficient documentation

## 2015-08-23 DIAGNOSIS — I110 Hypertensive heart disease with heart failure: Secondary | ICD-10-CM | POA: Insufficient documentation

## 2015-08-23 DIAGNOSIS — J449 Chronic obstructive pulmonary disease, unspecified: Secondary | ICD-10-CM | POA: Insufficient documentation

## 2015-08-23 DIAGNOSIS — Z79899 Other long term (current) drug therapy: Secondary | ICD-10-CM | POA: Diagnosis not present

## 2015-08-23 DIAGNOSIS — E039 Hypothyroidism, unspecified: Secondary | ICD-10-CM | POA: Diagnosis not present

## 2015-08-23 DIAGNOSIS — Z7982 Long term (current) use of aspirin: Secondary | ICD-10-CM | POA: Insufficient documentation

## 2015-08-23 DIAGNOSIS — Z85118 Personal history of other malignant neoplasm of bronchus and lung: Secondary | ICD-10-CM | POA: Insufficient documentation

## 2015-08-23 LAB — CBC WITH DIFFERENTIAL/PLATELET
BASOS ABS: 0.1 10*3/uL (ref 0–0.1)
Basophils Relative: 1 %
Eosinophils Absolute: 0.2 10*3/uL (ref 0–0.7)
HEMATOCRIT: 33.7 % — AB (ref 35.0–47.0)
Hemoglobin: 11 g/dL — ABNORMAL LOW (ref 12.0–16.0)
LYMPHS ABS: 1.1 10*3/uL (ref 1.0–3.6)
MCH: 25.7 pg — AB (ref 26.0–34.0)
MCHC: 32.6 g/dL (ref 32.0–36.0)
MCV: 78.8 fL — AB (ref 80.0–100.0)
MONO ABS: 1 10*3/uL — AB (ref 0.2–0.9)
NEUTROS ABS: 9.8 10*3/uL — AB (ref 1.4–6.5)
Neutrophils Relative %: 80 %
Platelets: 227 10*3/uL (ref 150–440)
RBC: 4.28 MIL/uL (ref 3.80–5.20)
RDW: 17.3 % — AB (ref 11.5–14.5)
WBC: 12.2 10*3/uL — ABNORMAL HIGH (ref 3.6–11.0)

## 2015-08-23 MED ORDER — OXYMETAZOLINE HCL 0.05 % NA SOLN
NASAL | Status: AC
  Administered 2015-08-23: 16:00:00
  Filled 2015-08-23: qty 15

## 2015-08-23 MED ORDER — OXYMETAZOLINE HCL 0.05 % NA SOLN
NASAL | Status: AC
  Administered 2015-08-23: 17:00:00
  Filled 2015-08-23: qty 15

## 2015-08-23 MED ORDER — BACITRACIN ZINC 500 UNIT/GM EX OINT
TOPICAL_OINTMENT | CUTANEOUS | Status: AC
  Administered 2015-08-23: 17:00:00
  Filled 2015-08-23: qty 0.9

## 2015-08-23 NOTE — ED Notes (Signed)
MD at bedside. 

## 2015-08-23 NOTE — ED Notes (Signed)
Pt came to ED c/o nosebleed that started about an hour and a half ago. Pt reports feels like she can feel blood in the back of her throat. Denies being on blood thinners

## 2015-08-23 NOTE — Discharge Instructions (Signed)
Nosebleed Nosebleeds are common. They are due to a crack in the inside lining of your nose (mucous membrane) or from a small blood vessel that starts to bleed. Nosebleeds can be caused by many conditions, such as injury, infections, dry mucous membranes or dry climate, medicines, nose picking, and home heating and cooling systems. Most nosebleeds come from blood vessels in the front of your nose. HOME CARE INSTRUCTIONS   Try controlling your nosebleed by pinching your nostrils gently and continuously for at least 10 minutes.  Avoid blowing or sniffing your nose for a number of hours after having a nosebleed.  Do not put gauze inside your nose yourself. If your nose was packed by your health care provider, try to maintain the pack inside of your nose until your health care provider removes it.  If a gauze pack was used and it starts to fall out, gently replace it or cut off the end of it.  If a balloon catheter was used to pack your nose, do not cut or remove it unless your health care provider has instructed you to do that.  Avoid lying down while you are having a nosebleed. Sit up and lean forward.  Use a nasal spray decongestant to help with a nosebleed as directed by your health care provider.  Do not use petroleum jelly or mineral oil in your nose. These can drip into your lungs.  Maintain humidity in your home by using less air conditioning or by using a humidifier.  Aspirinand blood thinners make bleeding more likely. If you are prescribed these medicines and you suffer from nosebleeds, ask your health care provider if you should stop taking the medicines or adjust the dose. Do not stop medicines unless directed by your health care provider  Resume your normal activities as you are able, but avoid straining, lifting, or bending at the waist for several days.  If your nosebleed was caused by dry mucous membranes, use over-the-counter saline nasal spray or gel. This will keep the  mucous membranes moist and allow them to heal. If you must use a lubricant, choose the water-soluble variety. Use it only sparingly, and do not use it within several hours of lying down.  Keep all follow-up visits as directed by your health care provider. This is important. SEEK MEDICAL CARE IF:  You have a fever.  You get frequent nosebleeds.  You are getting nosebleeds more often. SEEK IMMEDIATE MEDICAL CARE IF:  Your nosebleed lasts longer than 20 minutes.  Your nosebleed occurs after an injury to your face, and your nose looks crooked or broken.  You have unusual bleeding from other parts of your body.  You have unusual bruising on other parts of your body.  You feel light-headed or you faint.  You become sweaty.  You vomit blood.  Your nosebleed occurs after a head injury.   This information is not intended to replace advice given to you by your health care provider. Make sure you discuss any questions you have with your health care provider.   Document Released: 12/15/2004 Document Revised: 03/28/2014 Document Reviewed: 10/21/2013 Elsevier Interactive Patient Education [employer].  Please return immediately if condition worsens. Please contact her primary physician or the physician you were given for referral. If you have any specialist physicians involved in her treatment and plan please also contact them. Thank you for using [facility]. If packing happens to fall out and do not manipulate the nose at all up with the  packing back in. If it rebleeds placed a clamp and leave it on for 15 minutes. If it rebleeds after that 50 minutes. Please squirt some Afrin nasal spray in the nasal cavity and then re-clamp for another 15 minutes. If still unsuccessful and return here to the emergency department drink plenty of fluids especially cold liquids and he can always apply ice to the left side of the nose for pain and swelling.

## 2015-08-23 NOTE — ED Provider Notes (Signed)
Time Seen: Approximately 1540  I have reviewed the triage notes  Chief Complaint: Epistaxis   History of Present Illness: Marissa [name] is a 82 y.o. female who states sudden nosebleed today while she was sitting on the porch at approximately 1:00 this afternoon. Patient does have a tendency to traumatize her nose and does wear a chronic nasal cannula with a history of COPD. The daughter states that she has some form of hole in her nasal septum. Patient states the bleeding started on the left side and is not currently on any significant blood thinners other than aspirin. She denies any easy bruising or trouble clotting or blood. She denies any feelings of lightheadedness. She denies any chest pain or shortness of breath   Past Medical History  Diagnosis Date  . HTN (hypertension)   . Hypercholesteremia   . CAD (coronary artery disease)     s/p CABG 03/2011  . Aortic valve disorders   . RLS (restless legs syndrome)   . A-fib ([place])   . Osteopenia   . Mitral valve disease     s/p MVR 03/2011 w/ 27 mm porcine valve  . Hypothyroidism   . COPD (chronic obstructive pulmonary disease) ([place])   . CHF (congestive heart failure) ([place])   . Aortic stenosis     (s/p AVR w/ 21 mm porcine valve 03/2011  . Mastoiditis   . Myocardial infarction ([place])     "slight"  . Dysrhythmia   . Shortness of breath     with exertion  . Pre-diabetes   . GERD (gastroesophageal reflux disease)     takes nexium prn  . Arthritis   . History of blood transfusion     Rheumatic Fever  . Lung cancer ([place])   . Pneumonia   . Diabetes mellitus without complication ([place])   . Full code status 12/22/2014  . Full code status 12/22/2014    Patient Active Problem List   Diagnosis Date Noted  . Cancer of lower lobe of right lung ([place]) 08/03/2015  . Aortic insufficiency 04/06/2015  . Aortic valve replaced 04/06/2015  . Mitral valve replaced 04/06/2015  . Postoperative anemia due to acute blood loss 01/04/2015  .  Type 2 diabetes mellitus ([place]) 12/26/2014  . Closed left hip fracture ([place]) 12/25/2014  . Full code status 12/22/2014  . Full code status 12/22/2014  . Primary cancer of right upper lobe of lung ([place]) 12/19/2013  . Hypertensive heart disease with CHF (congestive heart failure) ([place])   . Hypercholesteremia   . CAD (coronary artery disease)   . Aortic valve disorder   . RLS (restless legs syndrome)   . A-fib ([place])   . Osteopenia   . Mitral valve disease   . Hypothyroidism   . COPD (chronic obstructive pulmonary disease) ([place])   . CHF (congestive heart failure) ([employer])   . Aortic stenosis   . Mastoiditis     Past Surgical History  Procedure Laterality Date  . Mitral valve replacement      1990's  . Laparoscopic cholecystectomy    . Aortic valve replacement    . Tonsillectomy    . Coronary artery bypass graft  03/2011    1990's also  . Video bronchoscopy with endobronchial navigation N/A 01/06/2014    Procedure: VIDEO BRONCHOSCOPY WITH ENDOBRONCHIAL NAVIGATION;  Surgeon: [name], MD;  Location: [facility];  Service: Thoracic;  Laterality: N/A;  . Video bronchoscopy with endobronchial ultrasound N/A 01/06/2014    Procedure: VIDEO BRONCHOSCOPY WITH  ENDOBRONCHIAL ULTRASOUND;  Surgeon: [name], MD;  Location: [facility];  Service: Thoracic;  Laterality: N/A;  . Video bronchoscopy with endobronchial navigation N/A 11/21/2014    Procedure: VIDEO BRONCHOSCOPY WITH ENDOBRONCHIAL NAVIGATION;  Surgeon: [name], MD;  Location: [facility];  Service: Thoracic;  Laterality: N/A;  . Video bronchoscopy with endobronchial ultrasound N/A 11/21/2014    Procedure: VIDEO BRONCHOSCOPY WITH ENDOBRONCHIAL ULTRASOUND;  Surgeon: [name], MD;  Location: [facility];  Service: Thoracic;  Laterality: N/A;  . Compression hip screw Left 12/26/2014    Procedure: ORIF LEFT HIP INTERTROCHANTRIC FRACTURE;  Surgeon: Frederik Pear, MD;  Location: [facility];  Service: Orthopedics;  Laterality: Left;     Past Surgical History  Procedure Laterality Date  . Mitral valve replacement      1990's  . Laparoscopic cholecystectomy    . Aortic valve replacement    . Tonsillectomy    . Coronary artery bypass graft  03/2011    1990's also  . Video bronchoscopy with endobronchial navigation N/A 01/06/2014    Procedure: VIDEO BRONCHOSCOPY WITH ENDOBRONCHIAL NAVIGATION;  Surgeon: [name], MD;  Location: [facility];  Service: Thoracic;  Laterality: N/A;  . Video bronchoscopy with endobronchial ultrasound N/A 01/06/2014    Procedure: VIDEO BRONCHOSCOPY WITH ENDOBRONCHIAL ULTRASOUND;  Surgeon: [name], MD;  Location: [facility];  Service: Thoracic;  Laterality: N/A;  . Video bronchoscopy with endobronchial navigation N/A 11/21/2014    Procedure: VIDEO BRONCHOSCOPY WITH ENDOBRONCHIAL NAVIGATION;  Surgeon: [name], MD;  Location: [facility];  Service: Thoracic;  Laterality: N/A;  . Video bronchoscopy with endobronchial ultrasound N/A 11/21/2014    Procedure: VIDEO BRONCHOSCOPY WITH ENDOBRONCHIAL ULTRASOUND;  Surgeon: [name], MD;  Location: [facility];  Service: Thoracic;  Laterality: N/A;  . Compression hip screw Left 12/26/2014    Procedure: ORIF LEFT HIP INTERTROCHANTRIC FRACTURE;  Surgeon: Frederik Pear, MD;  Location: [facility];  Service: Orthopedics;  Laterality: Left;    Current Outpatient Rx  Name  Route  Sig  Dispense  Refill  . acetaminophen (TYLENOL) 325 MG tablet   Oral   Take 2 tablets (650 mg total) by mouth every 6 (six) hours as needed for mild pain (or Fever >/= 101).         [name] albuterol (PROVENTIL) (2.5 MG/3ML) 0.083% nebulizer solution               . alendronate (FOSAMAX) 70 MG tablet   Oral   Take 70 mg by mouth once a week.          [name] aspirin EC 325 MG tablet   Oral   Take 1 tablet (325 mg total) by mouth 2 (two) times daily.   30 tablet   0   . atorvastatin (LIPITOR) 40 MG tablet   Oral   Take 40 mg by mouth daily. For HLD         .  CALCIUM CARBONATE PO   Oral   Take 1 tablet by mouth 2 (two) times daily.         [name] docusate sodium (COLACE) 100 MG capsule   Oral   Take 1 capsule (100 mg total) by mouth 2 (two) times daily.   10 capsule   0   . esomeprazole (NEXIUM) 40 MG capsule   Oral   Take 40 mg by mouth daily as needed (for acid reflux).          . fluticasone-salmeterol (ADVAIR HFA) 115-21 MCG/ACT  inhaler   Inhalation   Inhale 2 puffs into the lungs 2 (two) times daily.          . furosemide (LASIX) 40 MG tablet   Oral   Take 20-40 mg by mouth 2 (two) times daily. Take 40 mg by mouth in the morning and take 20 mg by mouth in the evening for fluid.         [name] glimepiride (AMARYL) 1 MG tablet   Oral   Take 1 mg by mouth daily with breakfast.         . HYDROcodone-homatropine (HYCODAN) 5-1.5 MG/5ML syrup   Oral   Take 5 mLs by mouth every 6 (six) hours as needed for cough.   120 mL   0   . Ipratropium-Albuterol (COMBIVENT) 20-100 MCG/ACT AERS respimat   Inhalation   Inhale 1 puff into the lungs every 6 (six) hours as needed for wheezing or shortness of breath.          [name] ipratropium-albuterol (DUONEB) 0.5-2.5 (3) MG/3ML SOLN   Nebulization   Take 3 mLs by nebulization every 6 (six) hours as needed (for shortness of breath).          [name] levothyroxine (SYNTHROID, LEVOTHROID) 100 MCG tablet   Oral   Take 100 mcg by mouth daily before breakfast.          . metoprolol succinate (TOPROL-XL) 50 MG 24 hr tablet   Oral   Take 1 tablet (50 mg total) by mouth every evening.   90 tablet   3     Please d/c carvedilol RX   . potassium chloride SA (K-DUR,KLOR-CON) 20 MEQ tablet   Oral   Take 20 mEq by mouth daily.         [name] rOPINIRole (REQUIP) 4 MG tablet   Oral   Take 4 mg by mouth at bedtime.         . senna-docusate (SENOKOT-S) 8.6-50 MG tablet   Oral   Take 1 tablet by mouth at bedtime as needed for mild constipation.         . vitamin C (ASCORBIC ACID) 500 MG tablet    Oral   Take 500 mg by mouth daily.           Allergies:  Coumadin and Prozac  Family History: Family History  Problem Relation Age of Onset  . Adopted: Yes  . Cancer Brother     lung  . Heart attack Neg Hx   . Stroke Neg Hx     Social History: Social History  Substance Use Topics  . Smoking status: Former Smoker -- 0.50 packs/day for 30 years    Types: Cigarettes  . Smokeless tobacco: Never Used  . Alcohol Use: No     Review of Systems:   10 point review of systems was performed and was otherwise negative:  Constitutional: No fever Eyes: No visual disturbances ENT: No sore throat, ear pain Cardiac: No chest pain Respiratory: No shortness of breath, wheezing, or stridor Abdomen: No abdominal pain, no vomiting, No diarrhea Endocrine: No weight loss, No night sweats Extremities: No peripheral edema, cyanosis Skin: No rashes, easy bruising Neurologic: No focal weakness, trouble with speech or swollowing Urologic: No dysuria, Hematuria, or urinary frequency   Physical Exam:  ED Triage Vitals  Enc Vitals Group     BP 08/23/15 1535 141/66 mmHg     Pulse Rate 08/23/15 1535 78     Resp 08/23/15 1535 20  Temp 08/23/15 1535 98.3 F (36.8 C)     Temp Source 08/23/15 1535 Oral     SpO2 08/23/15 1535 93 %     Weight --      Height --      Head Cir --      Peak Flow --      Pain Score 08/23/15 1614 0     Pain Loc --      Pain Edu? --      Excl. in [place]? --     General: Awake , Alert , and Oriented times 3; GCS 15 Very anxious Head: Normal cephalic , atraumatic Eyes: Pupils equal , round, reactive to light Nose/Throat: Patient was able to blow her nose and clear some clots. The primary source appears to be in the left nares. I can see some area anteriorly bleeding though difficult to visualize the septum in any detail. I do not visualize any: The septum. This evidence of a posterior bleed Neck: Supple, Full range of motion, No anterior adenopathy or palpable  thyroid masses Lungs: Clear to ascultation without wheezes , rhonchi, or rales Heart: Regular rate, regular rhythm without murmurs , gallops , or rubs Abdomen: Soft, non tender without rebound, guarding , or rigidity; bowel sounds positive and symmetric in all 4 quadrants. No organomegaly .        Extremities: 2 plus symmetric pulses. No edema, clubbing or cyanosis Neurologic: normal ambulation, Motor symmetric without deficits, sensory intact Skin: warm, dry, no rashes   Labs:   All laboratory work was reviewed including any pertinent negatives or positives listed below:  Labs Reviewed  CBC WITH DIFFERENTIAL/PLATELET   CBC within normal limits  Procedures Nasal packing The patient was advised to first blow out the clots out of both sides of her nasal cavity. Exam afterwards shows a fair amount of blood still pulling in the left nares region. I cannot visualize any septal defect. The patient's exam of the right septum shows defect on that side with no significant active bleeding. He was determined to pack the left nares. The patient had Afrin nasal spray inserted in the nasal cavity and then received a Murocel trimmed packing on the left with the packing coated with bacitracin. Patient then had inflation of the packing with Afrin nasal spray. She was observed here in emergency department for an hour and a half with no significant recurrence of the bleeding.    ED Course:  Patient's stay was uneventful and I felt she would do well with a nosebleed since she is not currently on any aggressive anticoagulation medication. The issue is with the patient having COPD and requiring supplemental oxygen therapy we set her up with a pediatric catheter with 2 ports located in the right nares. And advised continue with her current home supplemental oxygen therapy and is currently receiving radiation therapy for lung CA. Patient was referred to her ear nose and throat doctor which she states she's seen  before in the past and she was advised the packing will need to stay and to approximately Wednesday the patient and her daughter is her primary caretaker were given extensive instructions on home care with nosebleeds.    Assessment:  Acute epistaxis      Plan: * Outpatient management Patient was advised to return immediately if condition worsens. Patient was advised to follow up with their primary care physician or other specialized physicians involved in their outpatient care. The patient and/or family member/power of attorney had laboratory results  reviewed at the bedside. All questions and concerns were addressed and appropriate discharge instructions were distributed by the nursing staff.             Daymon Larsen, MD 08/23/15 [phone]

## 2015-08-23 NOTE — ED Notes (Signed)
Rhino rocket applied by MD Nathaneil Canary

## 2015-08-24 ENCOUNTER — Other Ambulatory Visit (HOSPITAL_BASED_OUTPATIENT_CLINIC_OR_DEPARTMENT_OTHER): Payer: Medicare Other

## 2015-08-24 DIAGNOSIS — C3411 Malignant neoplasm of upper lobe, right bronchus or lung: Secondary | ICD-10-CM | POA: Diagnosis present

## 2015-08-24 DIAGNOSIS — R040 Epistaxis: Secondary | ICD-10-CM | POA: Diagnosis not present

## 2015-08-24 DIAGNOSIS — J3489 Other specified disorders of nose and nasal sinuses: Secondary | ICD-10-CM | POA: Diagnosis not present

## 2015-08-24 LAB — COMPREHENSIVE METABOLIC PANEL
ALBUMIN: 3.4 g/dL — AB (ref 3.5–5.0)
ALT: 24 U/L (ref 0–55)
ANION GAP: 10 meq/L (ref 3–11)
AST: 23 U/L (ref 5–34)
Alkaline Phosphatase: 95 U/L (ref 40–150)
BILIRUBIN TOTAL: 0.81 mg/dL (ref 0.20–1.20)
BUN: 22.3 mg/dL (ref 7.0–26.0)
CALCIUM: 9.1 mg/dL (ref 8.4–10.4)
CHLORIDE: 102 meq/L (ref 98–109)
CO2: 29 meq/L (ref 22–29)
Creatinine: 0.8 mg/dL (ref 0.6–1.1)
EGFR: 66 mL/min/{1.73_m2} — ABNORMAL LOW (ref >90)
Glucose: 97 mg/dl (ref 70–140)
Potassium: 3.1 mEq/L — ABNORMAL LOW (ref 3.5–5.1)
Sodium: 141 mEq/L (ref 136–145)
Total Protein: 6.6 g/dL (ref 6.4–8.3)

## 2015-08-24 LAB — CBC WITH DIFFERENTIAL/PLATELET
BASO%: 0.8 % (ref 0.0–2.0)
BASOS ABS: 0.1 10*3/uL (ref 0.0–0.1)
EOS%: 1.7 % (ref 0.0–7.0)
Eosinophils Absolute: 0.2 10*3/uL (ref 0.0–0.5)
HEMATOCRIT: 33.1 % — AB (ref 34.8–46.6)
HEMOGLOBIN: 10.6 g/dL — AB (ref 11.6–15.9)
LYMPH#: 1.3 10*3/uL (ref 0.9–3.3)
LYMPH%: 13.5 % — ABNORMAL LOW (ref 14.0–49.7)
MCH: 25.5 pg (ref 25.1–34.0)
MCHC: 31.9 g/dL (ref 31.5–36.0)
MCV: 80.1 fL (ref 79.5–101.0)
MONO#: 0.8 10*3/uL (ref 0.1–0.9)
MONO%: 8.5 % (ref 0.0–14.0)
NEUT#: 7.2 10*3/uL — ABNORMAL HIGH (ref 1.5–6.5)
NEUT%: 75.5 % (ref 38.4–76.8)
PLATELETS: 197 10*3/uL (ref 145–400)
RBC: 4.14 10*6/uL (ref 3.70–5.45)
RDW: 17.5 % — AB (ref 11.2–14.5)
WBC: 9.5 10*3/uL (ref 3.9–10.3)

## 2015-08-24 NOTE — Progress Notes (Signed)
Quick Note:  Call patient with the result and order K Dur 20 meq po qd X 7 days ______ 

## 2015-08-25 ENCOUNTER — Telehealth: Payer: Self-pay | Admitting: Medical Oncology

## 2015-08-25 DIAGNOSIS — E876 Hypokalemia: Secondary | ICD-10-CM

## 2015-08-25 MED ORDER — POTASSIUM CHLORIDE CRYS ER 20 MEQ PO TBCR: 20.0000 meq | EXTENDED_RELEASE_TABLET | Freq: Every day | ORAL | Status: AC

## 2015-08-25 NOTE — Telephone Encounter (Signed)
-----   Message from Curt Bears, MD sent at 08/24/2015 10:43 PM EDT ----- Call patient with the result and order K Dur 20 meq po qd X 7 days.

## 2015-08-25 NOTE — Telephone Encounter (Signed)
called to [facility]

## 2015-08-27 ENCOUNTER — Ambulatory Visit: Payer: Medicare Other

## 2015-08-28 DIAGNOSIS — Z510 Encounter for antineoplastic radiation therapy: Secondary | ICD-10-CM | POA: Diagnosis not present

## 2015-08-28 DIAGNOSIS — C3411 Malignant neoplasm of upper lobe, right bronchus or lung: Secondary | ICD-10-CM | POA: Diagnosis not present

## 2015-08-31 ENCOUNTER — Ambulatory Visit
Admission: RE | Admit: 2015-08-31 | Discharge: 2015-08-31 | Disposition: A | Discharge status: Home / Self Care | Payer: Medicare Other | Source: Ambulatory Visit | Attending: Radiation Oncology | Admitting: Radiation Oncology

## 2015-08-31 ENCOUNTER — Encounter: Payer: Self-pay | Admitting: *Deleted

## 2015-08-31 ENCOUNTER — Ambulatory Visit (HOSPITAL_BASED_OUTPATIENT_CLINIC_OR_DEPARTMENT_OTHER): Payer: Medicare Other | Admitting: Internal Medicine

## 2015-08-31 ENCOUNTER — Encounter: Payer: Self-pay | Admitting: Internal Medicine

## 2015-08-31 VITALS — BP 117/49 | HR 87 | Temp 98.5°F | Resp 17 | Wt 132.0 lb

## 2015-08-31 DIAGNOSIS — R0602 Shortness of breath: Secondary | ICD-10-CM

## 2015-08-31 DIAGNOSIS — R05 Cough: Secondary | ICD-10-CM | POA: Diagnosis not present

## 2015-08-31 DIAGNOSIS — Z510 Encounter for antineoplastic radiation therapy: Secondary | ICD-10-CM | POA: Diagnosis not present

## 2015-08-31 DIAGNOSIS — C3411 Malignant neoplasm of upper lobe, right bronchus or lung: Secondary | ICD-10-CM | POA: Diagnosis not present

## 2015-08-31 NOTE — Progress Notes (Signed)
[facility] [phone]   [phone]  OFFICE PROGRESS NOTE  [name], MD [address]  [place] [address]  DIAGNOSIS: Stage IIIA (T2a, N2, M0) non-small cell lung cancer, squamous cell carcinoma presented with recurrence on the right upper lobe in addition to mediastinal lymphadenopathy diagnosed in September 2016.  PRIOR THERAPY: Concurrent chemoradiation with weekly carboplatin for AUC of 2 and paclitaxel 45 MG/M2. S/P 6 cycles.  CURRENT THERAPY: Observation.  INTERVAL HISTORY: Marissa [name] 82 y.o. female returns to the clinic today for follow-up visit accompanied by her daughter. She has been on observation for the last 6 months. She denied having any significant dysphagia or odynophagia. She denied having any significant nausea or vomiting. She has no fever or chills. She denied having any significant weight loss or night sweats. She is currently undergoing stereotactic radiotherapy under the care of Dr. Tammi Klippel. She was supposed to have repeat CT scan of the chest in 2 weeks but this would be delayed because of her current radiotherapy.  MEDICAL HISTORY: Past Medical History  Diagnosis Date  . HTN (hypertension)   . Hypercholesteremia   . CAD (coronary artery disease)     s/p CABG 03/2011  . Aortic valve disorders   . RLS (restless legs syndrome)   . A-fib ([place])   . Osteopenia   . Mitral valve disease     s/p MVR 03/2011 w/ 27 mm porcine valve  . Hypothyroidism   . COPD (chronic obstructive pulmonary disease) ([place])   . CHF (congestive heart failure) ([place])   . Aortic stenosis     (s/p AVR w/ 21 mm porcine valve 03/2011  . Mastoiditis   . Myocardial infarction ([place])     "slight"  . Dysrhythmia   . Shortness of breath     with exertion  . Pre-diabetes   . GERD (gastroesophageal reflux disease)     takes nexium prn  . Arthritis   . History of blood transfusion     Rheumatic Fever  . Lung cancer ([place])    . Pneumonia   . Diabetes mellitus without complication ([place])   . Full code status 12/22/2014  . Full code status 12/22/2014    ALLERGIES:  is allergic to coumadin and prozac.  MEDICATIONS:  Current Outpatient Prescriptions  Medication Sig Dispense Refill  . acetaminophen (TYLENOL) 325 MG tablet Take 2 tablets (650 mg total) by mouth every 6 (six) hours as needed for mild pain (or Fever >/= 101).    [name] albuterol (PROVENTIL) (2.5 MG/3ML) 0.083% nebulizer solution     . alendronate (FOSAMAX) 70 MG tablet Take 70 mg by mouth once a week.     [name] amoxicillin (AMOXIL) 500 MG tablet Take 500 mg by mouth.    [name] aspirin EC 325 MG tablet Take 1 tablet (325 mg total) by mouth 2 (two) times daily. 30 tablet 0  . atorvastatin (LIPITOR) 40 MG tablet Take 40 mg by mouth daily. For HLD    . CALCIUM CARBONATE PO Take 1 tablet by mouth 2 (two) times daily.    [name] docusate sodium (COLACE) 100 MG capsule Take 1 capsule (100 mg total) by mouth 2 (two) times daily. 10 capsule 0  . esomeprazole (NEXIUM) 40 MG capsule Take 40 mg by mouth daily as needed (for acid reflux).     . fluticasone-salmeterol (ADVAIR HFA) 115-21 MCG/ACT inhaler Inhale 2 puffs into the lungs 2 (two) times daily.     [name]  furosemide (LASIX) 40 MG tablet Take 20-40 mg by mouth 2 (two) times daily. Take 40 mg by mouth in the morning and take 20 mg by mouth in the evening for fluid.    [name] glimepiride (AMARYL) 1 MG tablet Take 1 mg by mouth daily with breakfast.    . HYDROcodone-homatropine (HYCODAN) 5-1.5 MG/5ML syrup Take 5 mLs by mouth every 6 (six) hours as needed for cough. 120 mL 0  . ibuprofen (ADVIL,MOTRIN) 400 MG tablet Take 400 mg by mouth.    . Ipratropium-Albuterol (COMBIVENT) 20-100 MCG/ACT AERS respimat Inhale 1 puff into the lungs every 6 (six) hours as needed for wheezing or shortness of breath.     [name] ipratropium-albuterol (DUONEB) 0.5-2.5 (3) MG/3ML SOLN Take 3 mLs by nebulization every 6 (six) hours as needed (for shortness of breath).      [name] levothyroxine (SYNTHROID, LEVOTHROID) 100 MCG tablet Take 100 mcg by mouth daily before breakfast.     . metoprolol succinate (TOPROL-XL) 50 MG 24 hr tablet Take 1 tablet (50 mg total) by mouth every evening. 90 tablet 3  . potassium chloride SA (K-DUR,KLOR-CON) 20 MEQ tablet Take 20 mEq by mouth daily.    . potassium chloride SA (K-DUR,KLOR-CON) 20 MEQ tablet Take 1 tablet (20 mEq total) by mouth daily. 7 tablet 0  . rOPINIRole (REQUIP) 4 MG tablet Take 4 mg by mouth at bedtime.    . senna-docusate (SENOKOT-S) 8.6-50 MG tablet Take 1 tablet by mouth at bedtime as needed for mild constipation.    . vitamin C (ASCORBIC ACID) 500 MG tablet Take 500 mg by mouth daily.     No current facility-administered medications for this visit.    SURGICAL HISTORY:  Past Surgical History  Procedure Laterality Date  . Mitral valve replacement      1990's  . Laparoscopic cholecystectomy    . Aortic valve replacement    . Tonsillectomy    . Coronary artery bypass graft  03/2011    1990's also  . Video bronchoscopy with endobronchial navigation N/A 01/06/2014    Procedure: VIDEO BRONCHOSCOPY WITH ENDOBRONCHIAL NAVIGATION;  Surgeon: [name], MD;  Location: [facility];  Service: Thoracic;  Laterality: N/A;  . Video bronchoscopy with endobronchial ultrasound N/A 01/06/2014    Procedure: VIDEO BRONCHOSCOPY WITH ENDOBRONCHIAL ULTRASOUND;  Surgeon: [name], MD;  Location: [facility];  Service: Thoracic;  Laterality: N/A;  . Video bronchoscopy with endobronchial navigation N/A 11/21/2014    Procedure: VIDEO BRONCHOSCOPY WITH ENDOBRONCHIAL NAVIGATION;  Surgeon: [name], MD;  Location: [facility];  Service: Thoracic;  Laterality: N/A;  . Video bronchoscopy with endobronchial ultrasound N/A 11/21/2014    Procedure: VIDEO BRONCHOSCOPY WITH ENDOBRONCHIAL ULTRASOUND;  Surgeon: [name], MD;  Location: [facility];  Service: Thoracic;  Laterality: N/A;  . Compression hip screw Left 12/26/2014     Procedure: ORIF LEFT HIP INTERTROCHANTRIC FRACTURE;  Surgeon: Frederik Pear, MD;  Location: [facility];  Service: Orthopedics;  Laterality: Left;    REVIEW OF SYSTEMS:  A comprehensive review of systems was negative except for: Respiratory: positive for cough and dyspnea on exertion   PHYSICAL EXAMINATION: General appearance: alert, cooperative, fatigued and no distress Head: Normocephalic, without obvious abnormality, atraumatic Neck: no adenopathy, no JVD, supple, symmetrical, trachea midline and thyroid not enlarged, symmetric, no tenderness/mass/nodules Lymph nodes: Cervical, supraclavicular, and axillary nodes normal. Resp: clear to auscultation bilaterally Back: symmetric, no curvature. ROM normal. No CVA tenderness. Cardio: regular rate and rhythm, S1, S2 normal, no  murmur, click, rub or gallop GI: soft, non-tender; bowel sounds normal; no masses,  no organomegaly Extremities: extremities normal, atraumatic, no cyanosis or edema Neurologic: Alert and oriented X 3, normal strength and tone. Normal symmetric reflexes. Normal coordination and gait  ECOG PERFORMANCE STATUS: 1 - Symptomatic but completely ambulatory  Blood pressure 117/49, pulse 87, temperature 98.5 F (36.9 C), temperature source Oral, resp. rate 17, weight 132 lb (59.875 kg), SpO2 92 %.  LABORATORY DATA: Lab Results  Component Value Date   WBC 9.5 08/24/2015   HGB 10.6* 08/24/2015   HCT 33.1* 08/24/2015   MCV 80.1 08/24/2015   PLT 197 08/24/2015      Chemistry      Component Value Date/Time   NA 141 08/24/2015 0945   NA 133* 12/29/2014 0010   NA 137 04/17/2014 0409   K 3.1* 08/24/2015 0945   K 4.4 12/29/2014 0010   K 2.8* 04/18/2014 0422   CL 93* 12/29/2014 0010   CL 98 04/17/2014 0409   CO2 29 08/24/2015 0945   CO2 31 12/29/2014 0010   CO2 30 04/17/2014 0409   BUN 22.3 08/24/2015 0945   BUN 17 12/29/2014 0010   BUN 19* 04/17/2014 0409   CREATININE 0.8 08/24/2015 0945   CREATININE 0.84 12/29/2014  0010   CREATININE 1.08 04/17/2014 0409      Component Value Date/Time   CALCIUM 9.1 08/24/2015 0945   CALCIUM 8.3* 12/29/2014 0010   CALCIUM 9.0 04/17/2014 0409   ALKPHOS 95 08/24/2015 0945   ALKPHOS 108 12/26/2014 1450   AST 23 08/24/2015 0945   AST 159* 12/26/2014 1450   ALT 24 08/24/2015 0945   ALT 170* 12/26/2014 1450   BILITOT 0.81 08/24/2015 0945   BILITOT 1.0 12/26/2014 1450       RADIOGRAPHIC STUDIES: No results found.  ASSESSMENT AND PLAN: This is a very pleasant 82 years old white female with recurrent non-small cell lung cancer presented as a stage IIIa, squamous cell carcinoma. The patient completed a course of concurrent chemoradiation with weekly carboplatin and paclitaxel. She status post 6 cycles. She is tolerating her treatment well. The patient continues to have shortness breath and cough productive of yellowish sputum. She is currently undergoing stereotactic radiotherapy under the care of Dr. Tammi Klippel. I recommended for the patient to continue her treatment as scheduled. I will cancel the CT scan that was scheduled for 09/13/2015. The patient would come back for follow-up visit in 3 months for evaluation after repeating CT scan of the chest for restaging of her disease. The patient was advised to call immediately if she has any concerning symptoms in the interval. The patient voices understanding of current disease status and treatment options and is in agreement with the current care plan.  All questions were answered. The patient knows to call the clinic with any problems, questions or concerns. We can certainly see the patient much sooner if necessary.  Disclaimer: This note was dictated with voice recognition software. Similar sounding words can inadvertently be transcribed and may not be corrected upon review.

## 2015-09-01 ENCOUNTER — Telehealth: Payer: Self-pay | Admitting: Internal Medicine

## 2015-09-01 NOTE — Telephone Encounter (Signed)
s.w. pt dtr and advised on june appt.[name] KitchenMarland KitchenMarland KitchenMarland Kitchenpt ok and aware

## 2015-09-03 ENCOUNTER — Ambulatory Visit
Admission: RE | Admit: 2015-09-03 | Discharge: 2015-09-03 | Disposition: A | Discharge status: Home / Self Care | Payer: Medicare Other | Source: Ambulatory Visit | Attending: Radiation Oncology | Admitting: Radiation Oncology

## 2015-09-03 DIAGNOSIS — Z510 Encounter for antineoplastic radiation therapy: Secondary | ICD-10-CM | POA: Diagnosis not present

## 2015-09-03 DIAGNOSIS — C3411 Malignant neoplasm of upper lobe, right bronchus or lung: Secondary | ICD-10-CM | POA: Diagnosis not present

## 2015-09-07 ENCOUNTER — Ambulatory Visit
Admission: RE | Admit: 2015-09-07 | Discharge: 2015-09-07 | Disposition: A | Discharge status: Home / Self Care | Payer: Medicare Other | Source: Ambulatory Visit | Attending: Radiation Oncology | Admitting: Radiation Oncology

## 2015-09-07 ENCOUNTER — Encounter: Payer: Self-pay | Admitting: Radiation Oncology

## 2015-09-07 VITALS — BP 114/46 | HR 105 | Temp 98.4°F | Resp 16 | Wt 132.6 lb

## 2015-09-07 DIAGNOSIS — Z510 Encounter for antineoplastic radiation therapy: Secondary | ICD-10-CM | POA: Diagnosis not present

## 2015-09-07 DIAGNOSIS — C3411 Malignant neoplasm of upper lobe, right bronchus or lung: Secondary | ICD-10-CM

## 2015-09-07 NOTE — Progress Notes (Signed)
Weight and vitals stable. Denies pain. Reports fatigue. Reports a productive cough with clear to yellow sputum. Denies hemoptysis. Denies a sore throat or difficulty swallowing. Reports SOB with exertion. One month follow up appointment card with Shona Simpson, PA-C given for August 7th (per the daughter's request).   BP 114/46 mmHg  Pulse 105  Temp(Src) 98.4 F (36.9 C) (Oral)  Resp 16  Wt 132 lb 9.6 oz (60.147 kg)  SpO2 92% Wt Readings from Last 3 Encounters:  09/07/15 132 lb 9.6 oz (60.147 kg)  08/31/15 132 lb (59.875 kg)  08/03/15 132 lb 8 oz (60.102 kg)

## 2015-09-07 NOTE — Progress Notes (Signed)
  Radiation Oncology         [phone]   Name: Marissa [name] MRN: [id number]   Date: 09/07/2015  DOB: 1933/12/22     Weekly Radiation Therapy Management    ICD-9-CM ICD-10-CM   1. Primary cancer of right upper lobe of lung (HCC) 162.3 C34.11     Current Dose: 54 Gy  Planned Dose:  54 Gy  Narrative The patient presents for routine under treatment assessment. Weight and vitals stable. Denies pain. Reports fatigue. Reports a productive cough with clear to yellow sputum. Denies hemoptysis. Denies a sore throat or difficulty swallowing. Reports SOB with exertion. One month follow up appointment card with Shona Simpson, PA-C given for August 7th (per the daughter's request).   The patient is without complaint. Set-up films were reviewed. The chart was checked.  Physical Findings  weight is 132 lb 9.6 oz (60.147 kg). Her oral temperature is 98.4 F (36.9 C). Her blood pressure is 114/46 and her pulse is 105. Her respiration is 16 and oxygen saturation is 92%. . Weight essentially stable.  No significant changes.  Impression The patient is tolerating radiation.  Plan The patient has completed treatment. Will return in one month for follow up appointment.          Sheral Apley Tammi Klippel, M.D.  This document serves as a record of services personally performed by Tyler Pita, MD. It was created on his behalf by Truddie Hidden, a trained medical scribe. The creation of this record is based on the scribe's personal observations and the provider's statements to them. This document has been checked and approved by the attending provider.

## 2015-09-15 NOTE — Progress Notes (Signed)
  Radiation Oncology         [phone] ________________________________  Name: ANETTE BARRA MRN: [id number]  Date: 09/07/2015  DOB: 1934/01/14  End of Treatment Note  Diagnosis:   82 y.o. woman with new stage I RLL lung cancer s/p previous SBRT for stage I in the right upper lobe and conventional radiation salvage     Indication for treatment:  Curative, Definitive SBRT       Radiation treatment dates:   08/31/2015-09/07/2015  Site/dose:   The target was treated to 54 Gy in 3 fractions of 18 Gy  Beams/energy:   The patient was treated using stereotactic body radiotherapy according to a 3D conformal radiotherapy plan.  Volumetric arc fields were employed to deliver 6 MV X-rays.  Image guidance was performed with per fraction cone beam CT prior to treatment under personal MD supervision.  Immobilization was achieved using BodyFix Pillow.  Narrative: The patient tolerated radiation treatment relatively well.     Plan: The patient has completed radiation treatment. The patient will return to radiation oncology clinic for routine followup in one month. I advised them to call or return sooner if they have any questions or concerns related to their recovery or treatment. ________________________________  Sheral Apley. Tammi Klippel, M.D.

## 2015-09-16 ENCOUNTER — Telehealth: Payer: Self-pay | Admitting: *Deleted

## 2015-09-16 ENCOUNTER — Other Ambulatory Visit: Payer: Self-pay | Admitting: Medical Oncology

## 2015-09-16 ENCOUNTER — Other Ambulatory Visit: Payer: Self-pay | Admitting: *Deleted

## 2015-09-16 DIAGNOSIS — C3411 Malignant neoplasm of upper lobe, right bronchus or lung: Secondary | ICD-10-CM

## 2015-09-16 NOTE — Telephone Encounter (Signed)
TC from pt's daughter inquiring about scheduling of follow CT scan prior to seeing Dr. Julien Nordmann on 12/21/15.  She has not been notified about when that scan has been scheduled.

## 2015-09-16 NOTE — Telephone Encounter (Addendum)
CT scan order in for sept. Per note in telephone restrictions i did not call daughter.

## 2015-10-08 ENCOUNTER — Ambulatory Visit: Payer: Self-pay | Admitting: Radiation Oncology

## 2015-10-26 ENCOUNTER — Other Ambulatory Visit: Payer: Self-pay | Admitting: Medical Oncology

## 2015-10-26 ENCOUNTER — Inpatient Hospital Stay: Admission: RE | Admit: 2015-10-26 | Payer: Self-pay | Source: Ambulatory Visit | Admitting: Radiation Oncology

## 2015-10-26 ENCOUNTER — Ambulatory Visit
Admission: RE | Admit: 2015-10-26 | Discharge: 2015-10-26 | Disposition: A | Discharge status: Home / Self Care | Payer: Medicare Other | Source: Ambulatory Visit | Attending: Radiation Oncology | Admitting: Radiation Oncology

## 2015-10-26 ENCOUNTER — Ambulatory Visit: Payer: Self-pay | Admitting: Radiation Oncology

## 2015-10-26 DIAGNOSIS — H7011 Chronic mastoiditis, right ear: Secondary | ICD-10-CM | POA: Diagnosis not present

## 2015-10-29 ENCOUNTER — Telehealth: Payer: Self-pay | Admitting: Internal Medicine

## 2015-10-29 NOTE — Telephone Encounter (Signed)
Per 8/7 pof moved 9/25 lab to 9/18 prior to ct. Spoke with dtr and mailed schedule. As a reminder dtr can only be called between 8-10:30 am. See comments on appointment desk.

## 2015-11-06 ENCOUNTER — Ambulatory Visit (HOSPITAL_COMMUNITY): Payer: Medicare Other

## 2015-11-09 ENCOUNTER — Encounter: Payer: Self-pay | Admitting: Cardiology

## 2015-11-09 ENCOUNTER — Ambulatory Visit (INDEPENDENT_AMBULATORY_CARE_PROVIDER_SITE_OTHER): Payer: Medicare Other | Admitting: Cardiology

## 2015-11-09 VITALS — BP 126/62 | HR 80 | Ht 63.0 in | Wt 130.0 lb

## 2015-11-09 DIAGNOSIS — Z954 Presence of other heart-valve replacement: Secondary | ICD-10-CM

## 2015-11-09 DIAGNOSIS — I2510 Atherosclerotic heart disease of native coronary artery without angina pectoris: Secondary | ICD-10-CM

## 2015-11-09 DIAGNOSIS — I481 Persistent atrial fibrillation: Secondary | ICD-10-CM

## 2015-11-09 DIAGNOSIS — I4819 Other persistent atrial fibrillation: Secondary | ICD-10-CM

## 2015-11-09 DIAGNOSIS — Z952 Presence of prosthetic heart valve: Secondary | ICD-10-CM

## 2015-11-09 DIAGNOSIS — I2583 Coronary atherosclerosis due to lipid rich plaque: Secondary | ICD-10-CM

## 2015-11-09 DIAGNOSIS — C3411 Malignant neoplasm of upper lobe, right bronchus or lung: Secondary | ICD-10-CM | POA: Diagnosis not present

## 2015-11-09 NOTE — Patient Instructions (Signed)
Medication Instructions:  The current medical regimen is effective;  continue present plan and medications.  Follow-Up: Follow up in 6 months with Lori Gerhardt, NP.  You will receive a letter in the mail 2 months before you are due.  Please call us when you receive this letter to schedule your follow up appointment.  Follow up in 1 year with Dr. Skains.  You will receive a letter in the mail 2 months before you are due.  Please call us when you receive this letter to schedule your follow up appointment.  If you need a refill on your cardiac medications before your next appointment, please call your pharmacy.  Thank you for choosing [facility]!!     

## 2015-11-09 NOTE — Progress Notes (Signed)
[place]. [address], [place]  [address] Phone: [phone] Fax:  [phone]  Date:  11/09/2015   ID:  NEESHA LANGTON, DOB 22-Oct-1933, MRN [id number]  PCP:  [name], MD   History of Present Illness: Marissa [name] is a 82 y.o. female with coronary artery disease status post bypass surgery with redo bypass surgery in January of 2013, COPD, aortic valve replacement in 2013 with 21 mm porcine valve, history of rheumatic fever with mitral valve replacement in 2013 with 27 mm porcine valve, history of atrial fibrillation, pulmonary hypertension, heart failure, stage IIIA Non-small cell lung cancer, squamous cell carcinoma presented with recurrence in the right upper lobe and additional mediastinal lymphadenopathy diagnosed in September 2016 s/p chemoradiation with carboplatin and paclitaxel here for follow-up.  She is demonstrating fairly significant shortness of breath which is at baseline.  Coronary bypass surgery was performed in [facility]. She had moderate aortic regurgitation, moderate to severe pulmonary hypertension, right coronary artery was occluded, patent right coronary bypass graft with some irregularity, LIMA to LAD patent in 2013.   She had a chronic long-standing atrial fibrillation history but has been intolerant to anticoagulants. Coumadin very small doses have caused bleeding into her ear, she states. She's been reluctant to try anticoagulantion again because of this. She has never had a stroke. Her redo bypass involved a saphenous vein graft to the RCA and redo mitral valve replacement.  She has had a previous admission to [facility] for "diastolic heart failure ".Normal ejection fraction, normal functioning mitral valve and aortic valve prosthesis. Eliquis at that time was started but she stopped taking.  Micardis was discontinued secondary to low blood pressure, Lasix was changed to 40 a.m., 20 p.m.  Also since her last visit,  aortic insufficiency has developed, loud murmur noted. Echocardiogram reviewed from October 2016.she also has suffered a hip fracture. Continue to encourage physical therapy. Home exercises.  She feels generalized fatigue from chemotherapy. Still remains positive. She is here with her daughter. Had epistaxis ER visit 08/2015.   11/09/15-overall doing well. Cancer therapy has been stable. Feels tired this morning. No significant change in cardiac symptoms. She is not short of breath other than baseline. No chest pain. Still has mild diastolic musical murmur. Prior echocardiogram reviewed.    Wt Readings from Last 3 Encounters:  11/09/15 130 lb (59 kg)  09/07/15 132 lb 9.6 oz (60.1 kg)  08/31/15 132 lb (59.9 kg)     Past Medical History:  Diagnosis Date  . A-fib ([place])   . Aortic stenosis    (s/p AVR w/ 21 mm porcine valve 03/2011  . Aortic valve disorders   . Arthritis   . CAD (coronary artery disease)    s/p CABG 03/2011  . CHF (congestive heart failure) ([place])   . COPD (chronic obstructive pulmonary disease) ([place])   . Diabetes mellitus without complication ([place])   . Dysrhythmia   . Full code status 12/22/2014  . Full code status 12/22/2014  . GERD (gastroesophageal reflux disease)    takes nexium prn  . History of blood transfusion    Rheumatic Fever  . HTN (hypertension)   . Hypercholesteremia   . Hypothyroidism   . Lung cancer ([place])   . Mastoiditis   . Mitral valve disease    s/p MVR 03/2011 w/ 27 mm porcine valve  . Myocardial infarction ([place])    "slight"  . Osteopenia   . Pneumonia   .  Pre-diabetes   . RLS (restless legs syndrome)   . Shortness of breath    with exertion    Past Surgical History:  Procedure Laterality Date  . AORTIC VALVE REPLACEMENT    . COMPRESSION HIP SCREW Left 12/26/2014   Procedure: ORIF LEFT HIP INTERTROCHANTRIC FRACTURE;  Surgeon: Frederik Pear, MD;  Location: [place] [facility];  Service: Orthopedics;  Laterality: Left;  . CORONARY ARTERY BYPASS GRAFT   03/2011   1990's also  . LAPAROSCOPIC CHOLECYSTECTOMY    . MITRAL VALVE REPLACEMENT     1990's  . TONSILLECTOMY    . VIDEO BRONCHOSCOPY WITH ENDOBRONCHIAL NAVIGATION N/A 01/06/2014   Procedure: VIDEO BRONCHOSCOPY WITH ENDOBRONCHIAL NAVIGATION;  Surgeon: [name], MD;  Location: [facility];  Service: Thoracic;  Laterality: N/A;  . VIDEO BRONCHOSCOPY WITH ENDOBRONCHIAL NAVIGATION N/A 11/21/2014   Procedure: VIDEO BRONCHOSCOPY WITH ENDOBRONCHIAL NAVIGATION;  Surgeon: [name], MD;  Location: [facility];  Service: Thoracic;  Laterality: N/A;  . VIDEO BRONCHOSCOPY WITH ENDOBRONCHIAL ULTRASOUND N/A 01/06/2014   Procedure: VIDEO BRONCHOSCOPY WITH ENDOBRONCHIAL ULTRASOUND;  Surgeon: [name], MD;  Location: [facility];  Service: Thoracic;  Laterality: N/A;  . VIDEO BRONCHOSCOPY WITH ENDOBRONCHIAL ULTRASOUND N/A 11/21/2014   Procedure: VIDEO BRONCHOSCOPY WITH ENDOBRONCHIAL ULTRASOUND;  Surgeon: [name], MD;  Location: [facility];  Service: Thoracic;  Laterality: N/A;    Current Outpatient Prescriptions  Medication Sig Dispense Refill  . acetaminophen (TYLENOL) 325 MG tablet Take 2 tablets (650 mg total) by mouth every 6 (six) hours as needed for mild pain (or Fever >/= 101).    [name] alendronate (FOSAMAX) 70 MG tablet Take 70 mg by mouth once a week.     [name] aspirin EC 325 MG tablet Take 1 tablet (325 mg total) by mouth 2 (two) times daily. 30 tablet 0  . atorvastatin (LIPITOR) 40 MG tablet Take 40 mg by mouth daily. For HLD    . CALCIUM CARBONATE PO Take 1 tablet by mouth 2 (two) times daily.    [name] docusate sodium (COLACE) 100 MG capsule Take 100 mg by mouth daily as needed for mild constipation.    [name] esomeprazole (NEXIUM) 40 MG capsule Take 40 mg by mouth daily as needed (for acid reflux).     . fluticasone-salmeterol (ADVAIR HFA) 115-21 MCG/ACT inhaler Inhale 2 puffs into the lungs 2 (two) times daily.     . furosemide (LASIX) 40 MG tablet Take 40 mg by mouth in the morning and  take 20 mg by mouth in the evening for fluid.    [name] glimepiride (AMARYL) 1 MG tablet Take 1 mg by mouth daily with breakfast.    . HYDROcodone-homatropine (HYCODAN) 5-1.5 MG/5ML syrup Take 5 mLs by mouth every 6 (six) hours as needed for cough. 120 mL 0  . Ipratropium-Albuterol (COMBIVENT) 20-100 MCG/ACT AERS respimat Inhale 1 puff into the lungs every 6 (six) hours as needed for wheezing or shortness of breath.     [name] ipratropium-albuterol (DUONEB) 0.5-2.5 (3) MG/3ML SOLN Take 3 mLs by nebulization every 6 (six) hours as needed (for shortness of breath).     [name] levothyroxine (SYNTHROID, LEVOTHROID) 100 MCG tablet Take 100 mcg by mouth daily before breakfast.     . metoprolol succinate (TOPROL-XL) 50 MG 24 hr tablet Take 1 tablet (50 mg total) by mouth every evening. 90 tablet 3  . potassium chloride SA (K-DUR,KLOR-CON) 20 MEQ tablet Take 1 tablet (20 mEq total) by mouth daily. 7 tablet 0  . rOPINIRole (REQUIP)  4 MG tablet Take 4 mg by mouth at bedtime.    . vitamin C (ASCORBIC ACID) 500 MG tablet Take 500 mg by mouth daily.     No current facility-administered medications for this visit.     Allergies:    Allergies  Allergen Reactions  . Coumadin [Warfarin Sodium] Other (See Comments)    Reaction: Bleeding from ears  . Prozac [Fluoxetine Hcl] Rash    Social History:  The patient  reports that she has quit smoking. Her smoking use included Cigarettes. She has a 15.00 pack-year smoking history. She has never used smokeless tobacco. She reports that she does not drink alcohol or use drugs. her son died in 2011/10/17.  ROS:  Please see the history of present illness.   Denies any bleeding, syncope, orthopnea, PND  PHYSICAL EXAM: VS:  BP 126/62   Pulse 80   Ht '5\' 3"'$  (1.6 m)   Wt 130 lb (59 kg)   BMI 23.03 kg/m  Well nourished, well developed, in no acute distress trouble breathing with ambulation HEENT: normal  Neck: no JVD  Cardiac:  Irregularly irregular; loud singing diastolic  murmur Prior surgical scar noted Lungs:  Distant breath sounds bilaterally, no active wheezing  Abd: soft, nontender, no hepatomegaly  Ext: no edema Mild erythema on medial aspect of ankle right leg.  Skin: warm and dry  Neuro: no focal abnormalities noted  EKG: 08/11/14-atrial fibrillation heart rate 70, rightward axis, nonspecific ST-T wave changes, LVH with repolarization abnormality-previously 02/25/13- Atrial fibrillation, heart rate 80, nonspecific ST changes   09/09/13-atrial fibrillation, 77, borderline right bundle branch block.   Echocardiogram: 12/26/14 - Left ventricle: The cavity size was normal. Wall thickness was increased in a pattern of severe LVH. Systolic function was vigorous. The estimated ejection fraction was in the range of 65% to 70%. - Aortic valve: Since report of Aug 2015, gradients acrross the AV prosthesis are increased and AI is present AV prosthesis difficult to see. Peak and mean gradients through the valve are 45 and 22 Hg. There was moderate regurgitation. - Mitral valve: Peak gradient across the MV prosthesis is 14 mm Hg. - Left atrium: The atrium was severely dilated. - Pulmonary arteries: PA peak pressure: 49 mm Hg (S).   ASSESSMENT AND PLAN:  1. Lung cancer-recurrent right upper lobe. Dr. Earlie Server. 2. Coronary artery disease-status post bypass. No active anginal symptoms. Prior bypass anatomy reviewed. 3. Chronic diastolic heart failure- Lasix.dosages reviewed. Evening dose was cut back at one point by Dr. Marisue Humble. This is fine. May need extra dose here in there. 4. Atrial fibrillation- previous lengthy discussions about anticoagulation. She does not wish to restart Coumadin because of previous inner ear bleeding, mastoid. She adamantly does not wish to be placed back on anticoagulation. She understands the risk of stroke. Dilated left atrium noted. Previous history of rheumatic heart disease. She was placed on Eliquis however this was  discontinued presumably for the biopsy. I asked her about restarting and she did not wish to do so. Off digoxin. 5. Aortic valve replacement-previous aortic stenosis. Echocardiogram reviewed. 12/26/14 which shows new aortic insufficiency, musical singing murmur noted on exam.. 6. Mitral valve replacement as well.dental prophylaxis 7. COPD-she states that she quit smoking in the 80s. She wishes that there were a therapy to fix the emphysema.home oxygen 8. Peripheral neuropathy- Dr. Marisue Humble. She has excellent dorsalis pedis pulse.  9. 6 month f/u with Cecille Rubin, 12 with me.  Signed, Candee Furbish, MD [facility]  11/09/2015 8:55  AM    

## 2015-11-26 ENCOUNTER — Other Ambulatory Visit: Payer: Self-pay | Admitting: Cardiology

## 2015-11-30 ENCOUNTER — Encounter: Payer: Self-pay | Admitting: Cardiology

## 2015-11-30 DIAGNOSIS — E7800 Pure hypercholesterolemia, unspecified: Secondary | ICD-10-CM | POA: Diagnosis not present

## 2015-11-30 DIAGNOSIS — G629 Polyneuropathy, unspecified: Secondary | ICD-10-CM | POA: Diagnosis not present

## 2015-11-30 DIAGNOSIS — E119 Type 2 diabetes mellitus without complications: Secondary | ICD-10-CM | POA: Diagnosis not present

## 2015-11-30 DIAGNOSIS — I2510 Atherosclerotic heart disease of native coronary artery without angina pectoris: Secondary | ICD-10-CM | POA: Diagnosis not present

## 2015-11-30 DIAGNOSIS — Z23 Encounter for immunization: Secondary | ICD-10-CM | POA: Diagnosis not present

## 2015-11-30 DIAGNOSIS — E039 Hypothyroidism, unspecified: Secondary | ICD-10-CM | POA: Diagnosis not present

## 2015-11-30 DIAGNOSIS — I110 Hypertensive heart disease with heart failure: Secondary | ICD-10-CM | POA: Diagnosis not present

## 2015-11-30 DIAGNOSIS — I509 Heart failure, unspecified: Secondary | ICD-10-CM | POA: Diagnosis not present

## 2015-11-30 DIAGNOSIS — E1149 Type 2 diabetes mellitus with other diabetic neurological complication: Secondary | ICD-10-CM | POA: Diagnosis not present

## 2015-12-07 ENCOUNTER — Encounter: Payer: Self-pay | Admitting: Internal Medicine

## 2015-12-07 ENCOUNTER — Emergency Department (HOSPITAL_COMMUNITY): Payer: Medicare Other

## 2015-12-07 ENCOUNTER — Other Ambulatory Visit (HOSPITAL_COMMUNITY): Payer: Self-pay

## 2015-12-07 ENCOUNTER — Ambulatory Visit (HOSPITAL_COMMUNITY)
Admission: RE | Admit: 2015-12-07 | Discharge: 2015-12-07 | Disposition: A | Discharge status: Home / Self Care | Payer: Medicare Other | Source: Ambulatory Visit | Attending: Internal Medicine | Admitting: Internal Medicine

## 2015-12-07 ENCOUNTER — Encounter (HOSPITAL_COMMUNITY): Payer: Self-pay | Admitting: Vascular Surgery

## 2015-12-07 ENCOUNTER — Other Ambulatory Visit (HOSPITAL_BASED_OUTPATIENT_CLINIC_OR_DEPARTMENT_OTHER): Payer: Medicare Other

## 2015-12-07 ENCOUNTER — Inpatient Hospital Stay (HOSPITAL_COMMUNITY)
Admission: EM | Admit: 2015-12-07 | Discharge: 2015-12-16 | DRG: 303 | Disposition: A | Discharge status: Home Health | Payer: Medicare Other | Attending: Internal Medicine | Admitting: Internal Medicine

## 2015-12-07 ENCOUNTER — Telehealth: Payer: Self-pay | Admitting: *Deleted

## 2015-12-07 ENCOUNTER — Other Ambulatory Visit: Payer: Self-pay

## 2015-12-07 DIAGNOSIS — I213 ST elevation (STEMI) myocardial infarction of unspecified site: Secondary | ICD-10-CM | POA: Diagnosis not present

## 2015-12-07 DIAGNOSIS — I482 Chronic atrial fibrillation, unspecified: Secondary | ICD-10-CM

## 2015-12-07 DIAGNOSIS — J438 Other emphysema: Secondary | ICD-10-CM

## 2015-12-07 DIAGNOSIS — R0602 Shortness of breath: Secondary | ICD-10-CM | POA: Diagnosis not present

## 2015-12-07 DIAGNOSIS — J432 Centrilobular emphysema: Secondary | ICD-10-CM | POA: Diagnosis present

## 2015-12-07 DIAGNOSIS — Z951 Presence of aortocoronary bypass graft: Secondary | ICD-10-CM

## 2015-12-07 DIAGNOSIS — Z801 Family history of malignant neoplasm of trachea, bronchus and lung: Secondary | ICD-10-CM

## 2015-12-07 DIAGNOSIS — I493 Ventricular premature depolarization: Secondary | ICD-10-CM | POA: Diagnosis present

## 2015-12-07 DIAGNOSIS — E119 Type 2 diabetes mellitus without complications: Secondary | ICD-10-CM | POA: Diagnosis present

## 2015-12-07 DIAGNOSIS — R918 Other nonspecific abnormal finding of lung field: Secondary | ICD-10-CM | POA: Insufficient documentation

## 2015-12-07 DIAGNOSIS — G2581 Restless legs syndrome: Secondary | ICD-10-CM | POA: Diagnosis present

## 2015-12-07 DIAGNOSIS — Z9981 Dependence on supplemental oxygen: Secondary | ICD-10-CM

## 2015-12-07 DIAGNOSIS — J440 Chronic obstructive pulmonary disease with acute lower respiratory infection: Secondary | ICD-10-CM | POA: Diagnosis not present

## 2015-12-07 DIAGNOSIS — R042 Hemoptysis: Secondary | ICD-10-CM | POA: Diagnosis not present

## 2015-12-07 DIAGNOSIS — M25571 Pain in right ankle and joints of right foot: Secondary | ICD-10-CM | POA: Diagnosis present

## 2015-12-07 DIAGNOSIS — R52 Pain, unspecified: Secondary | ICD-10-CM

## 2015-12-07 DIAGNOSIS — Z23 Encounter for immunization: Secondary | ICD-10-CM | POA: Diagnosis not present

## 2015-12-07 DIAGNOSIS — M109 Gout, unspecified: Secondary | ICD-10-CM | POA: Diagnosis present

## 2015-12-07 DIAGNOSIS — J449 Chronic obstructive pulmonary disease, unspecified: Secondary | ICD-10-CM | POA: Diagnosis present

## 2015-12-07 DIAGNOSIS — K219 Gastro-esophageal reflux disease without esophagitis: Secondary | ICD-10-CM | POA: Diagnosis present

## 2015-12-07 DIAGNOSIS — I110 Hypertensive heart disease with heart failure: Secondary | ICD-10-CM | POA: Diagnosis present

## 2015-12-07 DIAGNOSIS — I2510 Atherosclerotic heart disease of native coronary artery without angina pectoris: Secondary | ICD-10-CM | POA: Diagnosis present

## 2015-12-07 DIAGNOSIS — I4891 Unspecified atrial fibrillation: Secondary | ICD-10-CM | POA: Diagnosis present

## 2015-12-07 DIAGNOSIS — M8448XA Pathological fracture, other site, initial encounter for fracture: Secondary | ICD-10-CM | POA: Insufficient documentation

## 2015-12-07 DIAGNOSIS — E7800 Pure hypercholesterolemia, unspecified: Secondary | ICD-10-CM | POA: Diagnosis present

## 2015-12-07 DIAGNOSIS — C3411 Malignant neoplasm of upper lobe, right bronchus or lung: Secondary | ICD-10-CM

## 2015-12-07 DIAGNOSIS — J9611 Chronic respiratory failure with hypoxia: Secondary | ICD-10-CM | POA: Diagnosis present

## 2015-12-07 DIAGNOSIS — R040 Epistaxis: Secondary | ICD-10-CM | POA: Diagnosis not present

## 2015-12-07 DIAGNOSIS — I700 Atherosclerosis of aorta: Secondary | ICD-10-CM | POA: Insufficient documentation

## 2015-12-07 DIAGNOSIS — I513 Intracardiac thrombosis, not elsewhere classified: Principal | ICD-10-CM | POA: Diagnosis present

## 2015-12-07 DIAGNOSIS — Z87891 Personal history of nicotine dependence: Secondary | ICD-10-CM

## 2015-12-07 DIAGNOSIS — Z7982 Long term (current) use of aspirin: Secondary | ICD-10-CM

## 2015-12-07 DIAGNOSIS — Z952 Presence of prosthetic heart valve: Secondary | ICD-10-CM

## 2015-12-07 DIAGNOSIS — I252 Old myocardial infarction: Secondary | ICD-10-CM

## 2015-12-07 DIAGNOSIS — Z85118 Personal history of other malignant neoplasm of bronchus and lung: Secondary | ICD-10-CM | POA: Diagnosis not present

## 2015-12-07 DIAGNOSIS — Z9221 Personal history of antineoplastic chemotherapy: Secondary | ICD-10-CM

## 2015-12-07 DIAGNOSIS — E039 Hypothyroidism, unspecified: Secondary | ICD-10-CM | POA: Diagnosis present

## 2015-12-07 DIAGNOSIS — I5032 Chronic diastolic (congestive) heart failure: Secondary | ICD-10-CM | POA: Diagnosis present

## 2015-12-07 DIAGNOSIS — E785 Hyperlipidemia, unspecified: Secondary | ICD-10-CM | POA: Diagnosis present

## 2015-12-07 DIAGNOSIS — J90 Pleural effusion, not elsewhere classified: Secondary | ICD-10-CM | POA: Insufficient documentation

## 2015-12-07 DIAGNOSIS — Z923 Personal history of irradiation: Secondary | ICD-10-CM

## 2015-12-07 LAB — CBC
HCT: 37.5 % (ref 36.0–46.0)
HEMOGLOBIN: 11.1 g/dL — AB (ref 12.0–15.0)
MCH: 23.9 pg — AB (ref 26.0–34.0)
MCHC: 29.6 g/dL — ABNORMAL LOW (ref 30.0–36.0)
MCV: 80.6 fL (ref 78.0–100.0)
Platelets: 224 10*3/uL (ref 150–400)
RBC: 4.65 MIL/uL (ref 3.87–5.11)
RDW: 17.1 % — ABNORMAL HIGH (ref 11.5–15.5)
WBC: 9.5 10*3/uL (ref 4.0–10.5)

## 2015-12-07 LAB — COMPREHENSIVE METABOLIC PANEL
ALT: 19 U/L (ref 0–55)
AST: 19 U/L (ref 5–34)
Albumin: 3.2 g/dL — ABNORMAL LOW (ref 3.5–5.0)
Alkaline Phosphatase: 127 U/L (ref 40–150)
Anion Gap: 12 mEq/L — ABNORMAL HIGH (ref 3–11)
BUN: 23.1 mg/dL (ref 7.0–26.0)
CALCIUM: 9.3 mg/dL (ref 8.4–10.4)
CHLORIDE: 105 meq/L (ref 98–109)
CO2: 28 meq/L (ref 22–29)
CREATININE: 0.8 mg/dL (ref 0.6–1.1)
EGFR: 70 mL/min/{1.73_m2} — ABNORMAL LOW (ref >90)
Glucose: 107 mg/dl (ref 70–140)
POTASSIUM: 3.5 meq/L (ref 3.5–5.1)
Sodium: 145 mEq/L (ref 136–145)
Total Bilirubin: 0.62 mg/dL (ref 0.20–1.20)
Total Protein: 6.9 g/dL (ref 6.4–8.3)

## 2015-12-07 LAB — CBC WITH DIFFERENTIAL/PLATELET
BASO%: 0.4 % (ref 0.0–2.0)
BASOS ABS: 0 10*3/uL (ref 0.0–0.1)
EOS%: 1.6 % (ref 0.0–7.0)
Eosinophils Absolute: 0.1 10*3/uL (ref 0.0–0.5)
HEMATOCRIT: 34 % — AB (ref 34.8–46.6)
HGB: 10.8 g/dL — ABNORMAL LOW (ref 11.6–15.9)
LYMPH#: 0.7 10*3/uL — AB (ref 0.9–3.3)
LYMPH%: 8 % — AB (ref 14.0–49.7)
MCH: 23.9 pg — AB (ref 25.1–34.0)
MCHC: 31.8 g/dL (ref 31.5–36.0)
MCV: 75.4 fL — ABNORMAL LOW (ref 79.5–101.0)
MONO#: 0.9 10*3/uL (ref 0.1–0.9)
MONO%: 9.9 % (ref 0.0–14.0)
NEUT#: 7 10*3/uL — ABNORMAL HIGH (ref 1.5–6.5)
NEUT%: 80.1 % — AB (ref 38.4–76.8)
Platelets: 209 10*3/uL (ref 145–400)
RBC: 4.51 10*6/uL (ref 3.70–5.45)
RDW: 17.3 % — ABNORMAL HIGH (ref 11.2–14.5)
WBC: 8.8 10*3/uL (ref 3.9–10.3)

## 2015-12-07 LAB — BASIC METABOLIC PANEL
ANION GAP: 10 (ref 5–15)
BUN: 21 mg/dL — ABNORMAL HIGH (ref 6–20)
CALCIUM: 9.5 mg/dL (ref 8.9–10.3)
CO2: 26 mmol/L (ref 22–32)
Chloride: 104 mmol/L (ref 101–111)
Creatinine, Ser: 0.85 mg/dL (ref 0.44–1.00)
Glucose, Bld: 198 mg/dL — ABNORMAL HIGH (ref 65–99)
Potassium: 3.6 mmol/L (ref 3.5–5.1)
SODIUM: 140 mmol/L (ref 135–145)

## 2015-12-07 LAB — I-STAT TROPONIN, ED: TROPONIN I, POC: 0.01 ng/mL (ref 0.00–0.08)

## 2015-12-07 MED ORDER — IOPAMIDOL (ISOVUE-300) INJECTION 61%
75.0000 mL | Freq: Once | INTRAVENOUS | Status: AC | PRN
Start: 2015-12-07 — End: 2015-12-07
  Administered 2015-12-07: 75 mL via INTRAVENOUS

## 2015-12-07 NOTE — ED Triage Notes (Signed)
Pt reports to the ED for eval of mass to her heart. She had a CT scan for her regular 3 month follow up post chemo and they found a new mass on her heart. Her CA doctor sent her here. Pt wears 2 L of O2 via [place] at home at all times. Pt reports she has some increased SOB however. Denies and CP at this time.

## 2015-12-07 NOTE — Telephone Encounter (Signed)
Call to Dr. Marlou Porch office regarding CT Scan results. Spoke with Sunday Spillers who transferred call to VM at medical records  Reviewed with Dr. Julien Nordmann MD, Pt has been advised to go directly to [facility] ED immediately.  Faxed copy to [phone].

## 2015-12-07 NOTE — Progress Notes (Signed)
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 She is followed by Dr. Marlou Porch, cardiology. I dilated the scan results and also faxed a copy of this scan to his office. The patient is asymptomatic but I strongly recommended for her to go to the emergency department at [facility] for further evaluation of this condition by cardiology. She has a follow-up appointment with me for more detailed discussion of her scan and further treatment options. She may be a good candidate for palliative care at this point. The patient and her daughter gives her verbal understanding of the condition and needs to be seen at the emergency department.

## 2015-12-08 ENCOUNTER — Encounter (HOSPITAL_COMMUNITY): Payer: Self-pay | Admitting: Internal Medicine

## 2015-12-08 DIAGNOSIS — E785 Hyperlipidemia, unspecified: Secondary | ICD-10-CM | POA: Diagnosis present

## 2015-12-08 DIAGNOSIS — I482 Chronic atrial fibrillation: Secondary | ICD-10-CM | POA: Diagnosis not present

## 2015-12-08 DIAGNOSIS — J438 Other emphysema: Secondary | ICD-10-CM | POA: Diagnosis not present

## 2015-12-08 DIAGNOSIS — E034 Atrophy of thyroid (acquired): Secondary | ICD-10-CM

## 2015-12-08 DIAGNOSIS — I493 Ventricular premature depolarization: Secondary | ICD-10-CM | POA: Diagnosis present

## 2015-12-08 DIAGNOSIS — Z9981 Dependence on supplemental oxygen: Secondary | ICD-10-CM | POA: Diagnosis not present

## 2015-12-08 DIAGNOSIS — M109 Gout, unspecified: Secondary | ICD-10-CM | POA: Diagnosis present

## 2015-12-08 DIAGNOSIS — G2581 Restless legs syndrome: Secondary | ICD-10-CM | POA: Diagnosis present

## 2015-12-08 DIAGNOSIS — Z23 Encounter for immunization: Secondary | ICD-10-CM | POA: Diagnosis not present

## 2015-12-08 DIAGNOSIS — I513 Intracardiac thrombosis, not elsewhere classified: Secondary | ICD-10-CM | POA: Diagnosis present

## 2015-12-08 DIAGNOSIS — K219 Gastro-esophageal reflux disease without esophagitis: Secondary | ICD-10-CM | POA: Diagnosis present

## 2015-12-08 DIAGNOSIS — J9611 Chronic respiratory failure with hypoxia: Secondary | ICD-10-CM | POA: Diagnosis present

## 2015-12-08 DIAGNOSIS — E7800 Pure hypercholesterolemia, unspecified: Secondary | ICD-10-CM | POA: Diagnosis present

## 2015-12-08 DIAGNOSIS — I213 ST elevation (STEMI) myocardial infarction of unspecified site: Secondary | ICD-10-CM

## 2015-12-08 DIAGNOSIS — I5032 Chronic diastolic (congestive) heart failure: Secondary | ICD-10-CM | POA: Diagnosis not present

## 2015-12-08 DIAGNOSIS — R042 Hemoptysis: Secondary | ICD-10-CM | POA: Diagnosis present

## 2015-12-08 DIAGNOSIS — J432 Centrilobular emphysema: Secondary | ICD-10-CM | POA: Diagnosis present

## 2015-12-08 DIAGNOSIS — C3411 Malignant neoplasm of upper lobe, right bronchus or lung: Secondary | ICD-10-CM | POA: Diagnosis not present

## 2015-12-08 DIAGNOSIS — E039 Hypothyroidism, unspecified: Secondary | ICD-10-CM | POA: Diagnosis not present

## 2015-12-08 DIAGNOSIS — Z954 Presence of other heart-valve replacement: Secondary | ICD-10-CM

## 2015-12-08 DIAGNOSIS — I110 Hypertensive heart disease with heart failure: Secondary | ICD-10-CM | POA: Diagnosis present

## 2015-12-08 DIAGNOSIS — I350 Nonrheumatic aortic (valve) stenosis: Secondary | ICD-10-CM | POA: Diagnosis not present

## 2015-12-08 DIAGNOSIS — I2510 Atherosclerotic heart disease of native coronary artery without angina pectoris: Secondary | ICD-10-CM | POA: Diagnosis present

## 2015-12-08 DIAGNOSIS — E119 Type 2 diabetes mellitus without complications: Secondary | ICD-10-CM | POA: Diagnosis present

## 2015-12-08 DIAGNOSIS — J440 Chronic obstructive pulmonary disease with acute lower respiratory infection: Secondary | ICD-10-CM | POA: Diagnosis present

## 2015-12-08 DIAGNOSIS — Z951 Presence of aortocoronary bypass graft: Secondary | ICD-10-CM | POA: Diagnosis not present

## 2015-12-08 DIAGNOSIS — R040 Epistaxis: Secondary | ICD-10-CM | POA: Diagnosis not present

## 2015-12-08 DIAGNOSIS — I252 Old myocardial infarction: Secondary | ICD-10-CM | POA: Diagnosis not present

## 2015-12-08 DIAGNOSIS — I480 Paroxysmal atrial fibrillation: Secondary | ICD-10-CM

## 2015-12-08 DIAGNOSIS — E038 Other specified hypothyroidism: Secondary | ICD-10-CM

## 2015-12-08 DIAGNOSIS — M25571 Pain in right ankle and joints of right foot: Secondary | ICD-10-CM | POA: Diagnosis present

## 2015-12-08 LAB — CBC WITH DIFFERENTIAL/PLATELET
BASOS ABS: 0 10*3/uL (ref 0.0–0.1)
Basophils Relative: 0 %
Eosinophils Absolute: 0.2 10*3/uL (ref 0.0–0.7)
Eosinophils Relative: 3 %
HEMATOCRIT: 33.9 % — AB (ref 36.0–46.0)
HEMOGLOBIN: 10.1 g/dL — AB (ref 12.0–15.0)
LYMPHS PCT: 10 %
Lymphs Abs: 0.9 10*3/uL (ref 0.7–4.0)
MCH: 23.7 pg — ABNORMAL LOW (ref 26.0–34.0)
MCHC: 29.8 g/dL — AB (ref 30.0–36.0)
MCV: 79.4 fL (ref 78.0–100.0)
MONO ABS: 1 10*3/uL (ref 0.1–1.0)
MONOS PCT: 11 %
NEUTROS ABS: 6.8 10*3/uL (ref 1.7–7.7)
NEUTROS PCT: 76 %
PLATELETS: 207 10*3/uL (ref 150–400)
RBC: 4.27 MIL/uL (ref 3.87–5.11)
RDW: 16.7 % — AB (ref 11.5–15.5)
WBC: 8.9 10*3/uL (ref 4.0–10.5)

## 2015-12-08 LAB — BRAIN NATRIURETIC PEPTIDE: B NATRIURETIC PEPTIDE 5: 146.4 pg/mL — AB (ref 0.0–100.0)

## 2015-12-08 LAB — COMPREHENSIVE METABOLIC PANEL
ALBUMIN: 3.2 g/dL — AB (ref 3.5–5.0)
ALT: 19 U/L (ref 14–54)
ANION GAP: 10 (ref 5–15)
AST: 26 U/L (ref 15–41)
Alkaline Phosphatase: 95 U/L (ref 38–126)
BUN: 17 mg/dL (ref 6–20)
CO2: 26 mmol/L (ref 22–32)
Calcium: 9 mg/dL (ref 8.9–10.3)
Chloride: 103 mmol/L (ref 101–111)
Creatinine, Ser: 0.74 mg/dL (ref 0.44–1.00)
GFR calc non Af Amer: >60 mL/min (ref >60)
GLUCOSE: 87 mg/dL (ref 65–99)
POTASSIUM: 3 mmol/L — AB (ref 3.5–5.1)
SODIUM: 139 mmol/L (ref 135–145)
TOTAL PROTEIN: 6.3 g/dL — AB (ref 6.5–8.1)
Total Bilirubin: 0.6 mg/dL (ref 0.3–1.2)

## 2015-12-08 LAB — GLUCOSE, CAPILLARY
GLUCOSE-CAPILLARY: 103 mg/dL — AB (ref 65–99)
Glucose-Capillary: 98 mg/dL (ref 65–99)
Glucose-Capillary: 92 mg/dL (ref 65–99)

## 2015-12-08 LAB — HEPARIN LEVEL (UNFRACTIONATED)

## 2015-12-08 LAB — TROPONIN I

## 2015-12-08 MED ORDER — POTASSIUM CHLORIDE CRYS ER 20 MEQ PO TBCR
20.0000 meq | EXTENDED_RELEASE_TABLET | Freq: Every day | ORAL | Status: DC
  Administered 2015-12-08 – 2015-12-14 (×7): 20 meq via ORAL
  Filled 2015-12-08 (×8): qty 1

## 2015-12-08 MED ORDER — HEPARIN BOLUS VIA INFUSION
2000.0000 [IU] | Freq: Once | INTRAVENOUS | Status: AC
  Administered 2015-12-08: 2000 [IU] via INTRAVENOUS
  Filled 2015-12-08: qty 2000

## 2015-12-08 MED ORDER — FUROSEMIDE 20 MG PO TABS
20.0000 mg | ORAL_TABLET | Freq: Every evening | ORAL | Status: DC
  Administered 2015-12-08 – 2015-12-15 (×8): 20 mg via ORAL
  Filled 2015-12-08 (×9): qty 1

## 2015-12-08 MED ORDER — FUROSEMIDE 40 MG PO TABS
40.0000 mg | ORAL_TABLET | Freq: Every day | ORAL | Status: DC
  Administered 2015-12-08 – 2015-12-16 (×9): 40 mg via ORAL
  Filled 2015-12-08 (×9): qty 1

## 2015-12-08 MED ORDER — ONDANSETRON HCL 4 MG/2ML IJ SOLN: 4.0000 mg | Freq: Four times a day (QID) | INTRAMUSCULAR | Status: DC | PRN

## 2015-12-08 MED ORDER — ONDANSETRON HCL 4 MG PO TABS: 4.0000 mg | ORAL_TABLET | Freq: Four times a day (QID) | ORAL | Status: DC | PRN

## 2015-12-08 MED ORDER — HYDROCODONE-HOMATROPINE 5-1.5 MG/5ML PO SYRP: 5.0000 mL | ORAL_SOLUTION | Freq: Four times a day (QID) | ORAL | Status: DC | PRN

## 2015-12-08 MED ORDER — ASPIRIN EC 81 MG PO TBEC
81.0000 mg | DELAYED_RELEASE_TABLET | Freq: Every day | ORAL | Status: DC
  Administered 2015-12-08 – 2015-12-16 (×9): 81 mg via ORAL
  Filled 2015-12-08 (×9): qty 1

## 2015-12-08 MED ORDER — INSULIN ASPART 100 UNIT/ML ~~LOC~~ SOLN
0.0000 [IU] | Freq: Three times a day (TID) | SUBCUTANEOUS | Status: DC
  Administered 2015-12-16: 2 [IU] via SUBCUTANEOUS

## 2015-12-08 MED ORDER — DOCUSATE SODIUM 100 MG PO CAPS: 100.0000 mg | ORAL_CAPSULE | Freq: Every day | ORAL | Status: DC | PRN

## 2015-12-08 MED ORDER — IPRATROPIUM-ALBUTEROL 20-100 MCG/ACT IN AERS: 1.0000 | INHALATION_SPRAY | Freq: Four times a day (QID) | RESPIRATORY_TRACT | Status: DC | PRN

## 2015-12-08 MED ORDER — HEPARIN (PORCINE) IN NACL 100-0.45 UNIT/ML-% IJ SOLN
1250.0000 [IU]/h | INTRAMUSCULAR | Status: DC
  Administered 2015-12-08: 800 [IU]/h via INTRAVENOUS
  Administered 2015-12-08: 1000 [IU]/h via INTRAVENOUS
  Administered 2015-12-09: 1300 [IU]/h via INTRAVENOUS
  Filled 2015-12-08 (×3): qty 250

## 2015-12-08 MED ORDER — ROPINIROLE HCL 1 MG PO TABS
4.0000 mg | ORAL_TABLET | Freq: Every day | ORAL | Status: DC
  Administered 2015-12-08 – 2015-12-15 (×8): 4 mg via ORAL
  Filled 2015-12-08 (×7): qty 4
  Filled 2015-12-08: qty 8

## 2015-12-08 MED ORDER — METOPROLOL SUCCINATE ER 50 MG PO TB24
50.0000 mg | ORAL_TABLET | Freq: Every day | ORAL | Status: DC
  Administered 2015-12-08 – 2015-12-16 (×9): 50 mg via ORAL
  Filled 2015-12-08 (×9): qty 1

## 2015-12-08 MED ORDER — IPRATROPIUM-ALBUTEROL 0.5-2.5 (3) MG/3ML IN SOLN
3.0000 mL | Freq: Four times a day (QID) | RESPIRATORY_TRACT | Status: DC | PRN
  Administered 2015-12-08 (×3): 3 mL via RESPIRATORY_TRACT
  Filled 2015-12-08: qty 6
  Filled 2015-12-08 (×2): qty 3

## 2015-12-08 MED ORDER — ACETAMINOPHEN 650 MG RE SUPP: 650.0000 mg | Freq: Four times a day (QID) | RECTAL | Status: DC | PRN

## 2015-12-08 MED ORDER — ACETAMINOPHEN 325 MG PO TABS: 650.0000 mg | ORAL_TABLET | Freq: Four times a day (QID) | ORAL | Status: DC | PRN

## 2015-12-08 MED ORDER — PANTOPRAZOLE SODIUM 40 MG PO TBEC
40.0000 mg | DELAYED_RELEASE_TABLET | Freq: Every day | ORAL | Status: DC
Start: 2015-12-08 — End: 2015-12-16
  Administered 2015-12-08 – 2015-12-16 (×9): 40 mg via ORAL
  Filled 2015-12-08 (×9): qty 1

## 2015-12-08 MED ORDER — ATORVASTATIN CALCIUM 40 MG PO TABS
40.0000 mg | ORAL_TABLET | Freq: Every day | ORAL | Status: DC
  Administered 2015-12-08 – 2015-12-15 (×8): 40 mg via ORAL
  Filled 2015-12-08 (×9): qty 1

## 2015-12-08 MED ORDER — GLIMEPIRIDE 1 MG PO TABS
1.0000 mg | ORAL_TABLET | Freq: Every day | ORAL | Status: DC
  Administered 2015-12-08 – 2015-12-14 (×6): 1 mg via ORAL
  Filled 2015-12-08 (×8): qty 1

## 2015-12-08 MED ORDER — INSULIN ASPART 100 UNIT/ML ~~LOC~~ SOLN: 0.0000 [IU] | Freq: Three times a day (TID) | SUBCUTANEOUS | Status: DC

## 2015-12-08 MED ORDER — GUAIFENESIN ER 600 MG PO TB12
1200.0000 mg | ORAL_TABLET | Freq: Two times a day (BID) | ORAL | Status: DC
  Administered 2015-12-08: 600 mg via ORAL
  Administered 2015-12-09: 1200 mg via ORAL
  Administered 2015-12-10: 600 mg via ORAL
  Administered 2015-12-11 – 2015-12-12 (×2): 1200 mg via ORAL
  Administered 2015-12-12: 600 mg via ORAL
  Administered 2015-12-13 – 2015-12-14 (×3): 1200 mg via ORAL
  Administered 2015-12-14: 600 mg via ORAL
  Administered 2015-12-15 – 2015-12-16 (×3): 1200 mg via ORAL
  Filled 2015-12-08 (×16): qty 2

## 2015-12-08 MED ORDER — LEVOTHYROXINE SODIUM 100 MCG PO TABS
100.0000 ug | ORAL_TABLET | Freq: Every day | ORAL | Status: DC
  Administered 2015-12-09 – 2015-12-16 (×8): 100 ug via ORAL
  Filled 2015-12-08 (×9): qty 1

## 2015-12-08 MED ORDER — VITAMIN C 500 MG PO TABS
500.0000 mg | ORAL_TABLET | Freq: Every day | ORAL | Status: DC
  Administered 2015-12-08 – 2015-12-16 (×9): 500 mg via ORAL
  Filled 2015-12-08 (×11): qty 1

## 2015-12-08 MED ORDER — MOMETASONE FURO-FORMOTEROL FUM 200-5 MCG/ACT IN AERO
2.0000 | INHALATION_SPRAY | Freq: Two times a day (BID) | RESPIRATORY_TRACT | Status: DC
  Administered 2015-12-08 (×2): 2 via RESPIRATORY_TRACT
  Filled 2015-12-08: qty 8.8

## 2015-12-08 MED ORDER — HEPARIN BOLUS VIA INFUSION
2000.0000 [IU] | Freq: Once | INTRAVENOUS | Status: AC
Start: 2015-12-08 — End: 2015-12-08
  Administered 2015-12-08: 2000 [IU] via INTRAVENOUS
  Filled 2015-12-08: qty 2000

## 2015-12-08 MED ORDER — ADULT MULTIVITAMIN W/MINERALS CH
1.0000 | ORAL_TABLET | Freq: Every day | ORAL | Status: DC
  Administered 2015-12-08 – 2015-12-16 (×9): 1 via ORAL
  Filled 2015-12-08 (×9): qty 1

## 2015-12-08 NOTE — Consult Note (Signed)
Patient ID: Marissa [name] MRN: [id number], DOB/AGE: [birth year]   Admit date: 12/07/2015   Reason for Consult: LA Mass Requesting MD: Dr. Grandville Silos, Internal Medicine, Dr. Earlie Server, Oncology    Primary Physician: [name], MD Primary Cardiologist: Dr. Marlou Porch  Pt. Profile:  82 y/o female, followed by Dr. Marlou Porch, with a h/o CAD s/p CABG with redo bypass in January 2013, O2 dependent COPD, aortic valve replacement in 2013 with 21 mm porcine valve, history of rheumatic fever with mitral valve replacement in 2013 with 27 mm porcine valve, history of atrial fibrillation, pulmonary hypertension, heart failure, stage IIIA Non-small cell lung cancer, squamous cell carcinoma presented with recurrence in the right upper lobe and additional mediastinal lymphadenopathy diagnosed in September 2016 s/p chemoradiation with carboplatin and paclitaxel. Admitted for w/u after outpatient f/u chest CT demonstrates new LA mass.    Problem List  Past Medical History:  Diagnosis Date  . A-fib ([place])   . Aortic stenosis    (s/p AVR w/ 21 mm porcine valve 03/2011  . Aortic valve disorders   . Arthritis   . CAD (coronary artery disease)    s/p CABG 03/2011  . CHF (congestive heart failure) ([place])   . COPD (chronic obstructive pulmonary disease) ([place])   . Diabetes mellitus without complication ([place])   . Dysrhythmia   . Full code status 12/22/2014  . Full code status 12/22/2014  . GERD (gastroesophageal reflux disease)    takes nexium prn  . History of blood transfusion    Rheumatic Fever  . HTN (hypertension)   . Hypercholesteremia   . Hypothyroidism   . Lung cancer ([place])   . Mastoiditis   . Mitral valve disease    s/p MVR 03/2011 w/ 27 mm porcine valve  . Myocardial infarction ([place])    "slight"  . Osteopenia   . Pneumonia   . Pre-diabetes   . RLS (restless legs syndrome)   . Shortness of breath    with exertion    Past Surgical History:  Procedure Laterality Date  . AORTIC VALVE  REPLACEMENT    . COMPRESSION HIP SCREW Left 12/26/2014   Procedure: ORIF LEFT HIP INTERTROCHANTRIC FRACTURE;  Surgeon: Frederik Pear, MD;  Location: [facility];  Service: Orthopedics;  Laterality: Left;  . CORONARY ARTERY BYPASS GRAFT  03/2011   1990's also  . LAPAROSCOPIC CHOLECYSTECTOMY    . MITRAL VALVE REPLACEMENT     1990's  . TONSILLECTOMY    . VIDEO BRONCHOSCOPY WITH ENDOBRONCHIAL NAVIGATION N/A 01/06/2014   Procedure: VIDEO BRONCHOSCOPY WITH ENDOBRONCHIAL NAVIGATION;  Surgeon: [name], MD;  Location: [facility];  Service: Thoracic;  Laterality: N/A;  . VIDEO BRONCHOSCOPY WITH ENDOBRONCHIAL NAVIGATION N/A 11/21/2014   Procedure: VIDEO BRONCHOSCOPY WITH ENDOBRONCHIAL NAVIGATION;  Surgeon: [name], MD;  Location: [facility];  Service: Thoracic;  Laterality: N/A;  . VIDEO BRONCHOSCOPY WITH ENDOBRONCHIAL ULTRASOUND N/A 01/06/2014   Procedure: VIDEO BRONCHOSCOPY WITH ENDOBRONCHIAL ULTRASOUND;  Surgeon: [name], MD;  Location: [facility];  Service: Thoracic;  Laterality: N/A;  . VIDEO BRONCHOSCOPY WITH ENDOBRONCHIAL ULTRASOUND N/A 11/21/2014   Procedure: VIDEO BRONCHOSCOPY WITH ENDOBRONCHIAL ULTRASOUND;  Surgeon: [name], MD;  Location: [facility];  Service: Thoracic;  Laterality: N/A;     Allergies  Allergies  Allergen Reactions  . Coumadin [Warfarin Sodium] Other (See Comments)    Reaction: Bleeding from ears  . Prozac [Fluoxetine Hcl] Rash    HPI  82 y/o female, followed by Dr. Marlou Porch with a h/o  CAD s/p CABG with redo bypass in January 2013, O2 dependent COPD, aortic valve replacement in 2013 with 21 mm porcine valve, history of rheumatic fever with mitral valve replacement in 2013 with 27 mm porcine valve, history of atrial fibrillation, pulmonary hypertension, heart failure, stage IIIA Non-small cell lung cancer, squamous cell carcinoma presented with recurrence in the right upper lobe and additional mediastinal lymphadenopathy diagnosed in September 2016 s/p  chemoradiation with carboplatin and paclitaxel.  She had a chronic long-standing h/o atrial fibrillation but has been intolerant to anticoagulants. Coumadin, even very small doses have caused bleeding into her ear, she states. She's been reluctant to try anticoagulantion again because of this. She has never had a stroke. Her redo bypass involved a saphenous vein graft to the RCA and redo mitral valve replacement. She was recently seen in clinic by Dr. Marlou Porch 11/09/15 and was noted to be stable from a cardiac standpoint. He advised that she f/u in 6 months.   Her lung CA is followed by Dr. Earlie Server. She was scheduled for f/u CT scan today. This showed a large left atrial bland vs tumor thrombus, which appears to be new. She was notified to present to the ED for admission and cardiology consultation. Radiologist Interpretation outlined below:  IMPRESSION: 1. Large left atrial bland versus tumor thrombus, new. Critical Value/emergent results were called by telephone at the time of interpretation on 12/07/2015 at 9:16 am to Dr. Curt Bears , who verbally acknowledged these results. 2. Increasing masslike consolidation and possible separate new necrotic nodule in the right upper lobe with increasing right loculated pleural effusion and new low right internal jugular ill-defined soft tissue, findings all worrisome for disease progression. 3. Previously seen hypermetabolic right lower lobe nodule is no longer identified. 4. Likely healing pathologic fracture of the lateral right third rib. 5. Aortic atherosclerosis.  She has been admitted by IM and is currently on IV heparin. EKG shows irregular rhythm, likely atrial fibrillation w/ PVCs. Rate is controlled. She denies any recent CP. She has chronic dyspnea, slightly worse with productive cough + wheezing.    Home Medications  Prior to Admission medications   Medication Sig Start Date End Date Taking? Authorizing Provider  acetaminophen  (TYLENOL) 325 MG tablet Take 2 tablets (650 mg total) by mouth every 6 (six) hours as needed for mild pain (or Fever >/= 101). 12/29/14  Yes Delfina Redwood, MD  alendronate (FOSAMAX) 70 MG tablet Take 70 mg by mouth once a week.  09/16/14  Yes Historical Provider, MD  aspirin EC 81 MG tablet Take 81 mg by mouth every morning.   Yes Historical Provider, MD  atorvastatin (LIPITOR) 40 MG tablet Take 40 mg by mouth daily. For HLD   Yes Historical Provider, MD  CALCIUM CARBONATE PO Take 1 tablet by mouth 2 (two) times daily.   Yes Historical Provider, MD  docusate sodium (COLACE) 100 MG capsule Take 100 mg by mouth daily as needed for mild constipation.   Yes Historical Provider, MD  esomeprazole (NEXIUM) 40 MG capsule Take 40 mg by mouth daily as needed (for acid reflux).    Yes Historical Provider, MD  fluticasone-salmeterol (ADVAIR HFA) 115-21 MCG/ACT inhaler Inhale 2 puffs into the lungs 2 (two) times daily.    Yes Historical Provider, MD  furosemide (LASIX) 40 MG tablet Take 40 mg by mouth in the morning and take 20 mg by mouth in the evening for fluid.   Yes Historical Provider, MD  glimepiride (AMARYL) 1 MG tablet  Take 1 mg by mouth daily with breakfast.   Yes Historical Provider, MD  HYDROcodone-homatropine (HYCODAN) 5-1.5 MG/5ML syrup Take 5 mLs by mouth every 6 (six) hours as needed for cough. 06/15/15  Yes Curt Bears, MD  Ipratropium-Albuterol (COMBIVENT) 20-100 MCG/ACT AERS respimat Inhale 1 puff into the lungs every 6 (six) hours as needed for wheezing or shortness of breath.    Yes Historical Provider, MD  ipratropium-albuterol (DUONEB) 0.5-2.5 (3) MG/3ML SOLN Take 3 mLs by nebulization every 6 (six) hours as needed (for shortness of breath).    Yes Historical Provider, MD  levothyroxine (SYNTHROID, LEVOTHROID) 100 MCG tablet Take 100 mcg by mouth daily before breakfast.    Yes Historical Provider, MD  Multiple Vitamin (MULTIVITAMIN WITH MINERALS) TABS tablet Take 1 tablet by mouth  daily.   Yes Historical Provider, MD  potassium chloride SA (K-DUR,KLOR-CON) 20 MEQ tablet Take 1 tablet (20 mEq total) by mouth daily. 08/25/15  Yes Curt Bears, MD  rOPINIRole (REQUIP) 4 MG tablet Take 4 mg by mouth at bedtime.   Yes Historical Provider, MD  TOPROL XL 50 MG 24 hr tablet TAKE 1 TABLET EVERY EVENING (DISCONTINUE CARVEDIOLOL PRESCRIPTION) 11/26/15  Yes Jerline Pain, MD  vitamin C (ASCORBIC ACID) 500 MG tablet Take 500 mg by mouth daily.   Yes Historical Provider, MD    Hospital Meds  . aspirin EC  81 mg Oral Daily  . atorvastatin  40 mg Oral q1800  . furosemide  20 mg Oral QPM  . furosemide  40 mg Oral Daily  . glimepiride  1 mg Oral Q breakfast  . insulin aspart  0-9 Units Subcutaneous TID WC  . levothyroxine  100 mcg Oral QAC breakfast  . metoprolol succinate  50 mg Oral Daily  . mometasone-formoterol  2 puff Inhalation BID  . multivitamin with minerals  1 tablet Oral Daily  . pantoprazole  40 mg Oral Daily  . potassium chloride SA  20 mEq Oral Daily  . rOPINIRole  4 mg Oral QHS  . vitamin C  500 mg Oral Daily   . aspirin EC  81 mg Oral Daily  . atorvastatin  40 mg Oral q1800  . furosemide  20 mg Oral QPM  . furosemide  40 mg Oral Daily  . glimepiride  1 mg Oral Q breakfast  . insulin aspart  0-9 Units Subcutaneous TID WC  . levothyroxine  100 mcg Oral QAC breakfast  . metoprolol succinate  50 mg Oral Daily  . mometasone-formoterol  2 puff Inhalation BID  . multivitamin with minerals  1 tablet Oral Daily  . pantoprazole  40 mg Oral Daily  . potassium chloride SA  20 mEq Oral Daily  . rOPINIRole  4 mg Oral QHS  . vitamin C  500 mg Oral Daily    Family History  Family History  Problem Relation Age of Onset  . Adopted: Yes  . Cancer Brother     lung  . Heart attack Neg Hx   . Stroke Neg Hx     Social History  Social History   Social History  . Marital status: Widowed    Spouse name: N/A  . Number of children: N/A  . Years of education: N/A    Occupational History  . Not on file.   Social History Main Topics  . Smoking status: Former Smoker    Packs/day: 0.50    Years: 30.00    Types: Cigarettes  . Smokeless tobacco: Never Used  .  Alcohol use No  . Drug use: No  . Sexual activity: No     Comment: quit in 1980's   Other Topics Concern  . Not on file   Social History Narrative  . No narrative on file     Review of Systems General:  No chills, fever, night sweats or weight changes.  Cardiovascular:  No chest pain, dyspnea on exertion, edema, orthopnea, palpitations, paroxysmal nocturnal dyspnea. Dermatological: No rash, lesions/masses Respiratory: No cough, dyspnea Urologic: No hematuria, dysuria Abdominal:   No nausea, vomiting, diarrhea, bright red blood per rectum, melena, or hematemesis Neurologic:  No visual changes, wkns, changes in mental status. All other systems reviewed and are otherwise negative except as noted above.  Physical Exam  Blood pressure (!) 117/55, pulse 85, temperature 97.8 F (36.6 C), temperature source Oral, resp. rate 22, height '5\' 3"'$  (1.6 m), weight 132 lb 6.4 oz (60.1 kg), SpO2 95 %.  General: Pleasant, NAD, elderly and frail  Psych: Normal affect. Neuro: Alert and oriented X 3. Moves all extremities spontaneously. HEENT: Normal  Neck: Supple without bruits or JVD. Lungs:  Resp regular and unlabored, faint wheezing bilaterally Heart: irregularly irregular, regular rate Abdomen: Soft, non-tender, non-distended, BS + x 4.  Extremities: No clubbing, cyanosis or edema. DP/PT/Radials 2+ and equal bilaterally.  Labs  Troponin [facility] Test)  Recent Labs  12/07/15 1744  TROPIPOC 0.01    Recent Labs  12/08/15 0503  TROPONINI <0.03   Lab Results  Component Value Date   WBC 8.9 12/08/2015   HGB 10.1 (L) 12/08/2015   HCT 33.9 (L) 12/08/2015   MCV 79.4 12/08/2015   PLT 207 12/08/2015    Recent Labs Lab 12/08/15 0503  NA 139  K 3.0*  CL 103  CO2 26  BUN 17   CREATININE 0.74  CALCIUM 9.0  PROT 6.3*  BILITOT 0.6  ALKPHOS 95  ALT 19  AST 26  GLUCOSE 87   Lab Results  Component Value Date   CHOL 81 11/04/2013   HDL 28 (L) 11/04/2013   LDLCALC 15 11/04/2013   TRIG 188 11/04/2013   No results found for: DDIMER   Radiology/Studies  Dg Chest 2 View  Result Date: 12/07/2015 CLINICAL DATA:  Worsening shortness of breath.  Known lung cancer. EXAM: CHEST  2 VIEW COMPARISON:  Chest CT from the same date. FINDINGS: The heart is normal in size and stable. Stable tortuosity and calcification of the thoracic aorta. Extensive tumor, radiation fibrosis and pleural fluid involving the right hemi thorax. The left lung is relatively clear. Minimal left basilar atelectasis. Stable surgical changes from valve replacement surgery. The bony thorax is grossly intact. IMPRESSION: Extensive radiation changes, tumor and pleural effusion involving the right hemi thorax. Electronically Signed   By: Marijo Sanes M.D.   On: 12/07/2015 18:23   Ct Chest W Contrast  Result Date: 12/07/2015 CLINICAL DATA:  Lung cancer, chemotherapy and radiation therapy complete. Shortness of breath. EXAM: CT CHEST WITH CONTRAST TECHNIQUE: Multidetector CT imaging of the chest was performed during intravenous contrast administration. CONTRAST:  52m ISOVUE-300 IOPAMIDOL (ISOVUE-300) INJECTION 61% COMPARISON:  PET 07/06/2015 and CT chest 06/01/2015. FINDINGS: Cardiovascular: Atherosclerotic calcification of the arterial vasculature. A large low-attenuation defect is seen in the posterior aspect of the left atrium, measuring 2.4 x 5.3 cm, new. Heart is at the upper limits of normal in size. No pericardial effusion. Mediastinum/Nodes: Hazy soft tissue fullness is seen in the low right internal jugular station (series 2, images  10-15), difficult to measure. Finding appears new from 06/01/2015. Low right paratracheal lymph node measures 1.7 cm, possibly stable but somewhat poorly defined on the prior  study. Soft tissue is seen in the right hilum without a discrete lymph node. No axillary adenopathy. Pre pericardiac/juxta diaphragmatic lymph nodes are sub cm in short axis size. Lungs/Pleura: Increased masslike consolidation in the right upper lobe, difficult to measure, with an enlarging loculated right pleural effusion. Likely radiation fibrosis in the perihilar right hemi thorax. Possible necrotic subpleural nodule in the anterior segment right upper lobe, measuring 2.6 cm (images 47-48), new. Previously seen hypermetabolic right lower lobe pulmonary nodule is no longer readily identified. Persistent volume loss in the left lower lobe. Centrilobular emphysema, mild. Upper Abdomen: Visualized portions of the liver, adrenal glands, kidneys, spleen, pancreas, stomach and bowel are grossly unremarkable. No upper abdominal adenopathy. Cholecystectomy. Musculoskeletal: Likely pathologic fracture and associated healing involving the right third lateral rib (series 2, image 34). IMPRESSION: 1. Large left atrial bland versus tumor thrombus, new. Critical Value/emergent results were called by telephone at the time of interpretation on 12/07/2015 at 9:16 am to Dr. Curt Bears , who verbally acknowledged these results. 2. Increasing masslike consolidation and possible separate new necrotic nodule in the right upper lobe with increasing right loculated pleural effusion and new low right internal jugular ill-defined soft tissue, findings all worrisome for disease progression. 3. Previously seen hypermetabolic right lower lobe nodule is no longer identified. 4. Likely healing pathologic fracture of the lateral right third rib. 5. Aortic atherosclerosis. Electronically Signed   By: Lorin Picket M.D.   On: 12/07/2015 09:17    ECG  Atrial fibrillation with PVCs   ASSESSMENT AND PLAN Principal Problem:   Left atrial thrombus ([facility]) Active Problems:   A-fib ([facility])   Hypothyroidism   COPD (chronic obstructive  pulmonary disease) ([facility])   Primary cancer of right upper lobe of lung ([facility])   Type 2 diabetes mellitus ([place])   Aortic valve replaced   Mitral valve replaced    1. New LA Mass: patient w/ lung CA. ? Mass vs Thrombus. Thrombus could be likely as she also has a h/o atrial fibrillation but has not been anticoagulated due to prior intolerances. This was discussed in detail at her last OV with Dr. Marlou Porch on 11/09/15 (see assessment and Plan). It was well outlined that she was encouraged to reconsider reinstitution of anticoagulation for stroke reduction, however the patient refused. To better assess, she will require a TEE vs possible cardiac MRI. MD to assess and will determine. However with possible disease progression of her lung cancer, will need to determine care goals. ? Palliative approach/ limited w/u.   2. Lung CA: followed by Dr. Earlie Server. Chest CT today demonstrates increasing masslike consolidation and possible separate new necrotic nodule in the right upper lobe with increasing right oculated pleural effusion and new low right internal jugular ill-defined soft tissue, findings all worrisome for disease progression.  3. CAD: s/p remote CABG with redo bypass in 2013.   4. Atrial Fibrillation: on rate control therapy with metoprolol. As outlined above she has refused anticoagulation, despite risk for CVA. Now with possible LA thrombus. She is currently on IV heparin.   5. Aortic Valve Disease: s/p  porcine valve at time of redo bypass in 2013.   6. Mitral Valve Disease: h/o rheumatic fever with mitral valve replacement in 2013   7. COPD: O2 dependent. Management per IM.     Signed, Lyda Jester, PA-C 12/08/2015,  3:33 PM   Personally seen and examined. Agree with above.  82 year old female with progression of her right-sided lung cancer. On CT scan today along with newly discovered left atrial mass, likely thrombus.   - She has been adamant about refusal of anticoagulation. We  have had lengthy discussions about this in clinic. At one point, we tried Eliquis (off label) but she did not try this long enough to be effective.   - She has had both aortic valve replacement as well as mitral valve replacement.   - She has been refusing Coumadin in the past because of prior inner ear bleeding.   - She is currently on IV heparin, I agree. Traditionally, Coumadin should be utilized in this situation and perhaps we can convince her to try this once again especially in light of her apparent left atrial thrombus. If she completely refuses, the next best option would be Eliquis however this would be off labeled use.  We will discuss further. With her advancement of lung cancer, potentially she may require a palliative approach.  Candee Furbish, MD

## 2015-12-08 NOTE — Progress Notes (Signed)
I have seen and assessed patient and agree with Dr.Kakrakandy's assessment and plan. Patient is a 82 year old female history of lung cancer presented to the ED after routine CT chest showed possible left atrial thrombus. Patient placed on IV heparin. Check a 2-D echo. Cardiology consultation.

## 2015-12-08 NOTE — H&P (Signed)
History and Physical    METZLI POLLICK XHB:[id number] DOB: 12/16/1933 DOA: 12/07/2015  PCP: [name], MD  Patient coming from: Home.  Chief Complaint: Abnormal CT chest.  HPI: Marissa [name] is a 82 y.o. female with lung cancer who has had a routine CT chest which showed possible left atrial thrombus was advised to come to the ER for further management. CT chest shows possible left atrial thrombus for which patient was started on IV heparin in the ER and admitted for further observation and management. Patient is not short of breath, has nonspecific chest pain in the right side.   ED Course: IV heparin was started.  Review of Systems: As per HPI, rest all negative.   Past Medical History:  Diagnosis Date  . A-fib ([place])   . Aortic stenosis    (s/p AVR w/ 21 mm porcine valve 03/2011  . Aortic valve disorders   . Arthritis   . CAD (coronary artery disease)    s/p CABG 03/2011  . CHF (congestive heart failure) ([place])   . COPD (chronic obstructive pulmonary disease) ([place])   . Diabetes mellitus without complication ([place])   . Dysrhythmia   . Full code status 12/22/2014  . Full code status 12/22/2014  . GERD (gastroesophageal reflux disease)    takes nexium prn  . History of blood transfusion    Rheumatic Fever  . HTN (hypertension)   . Hypercholesteremia   . Hypothyroidism   . Lung cancer ([place])   . Mastoiditis   . Mitral valve disease    s/p MVR 03/2011 w/ 27 mm porcine valve  . Myocardial infarction ([place])    "slight"  . Osteopenia   . Pneumonia   . Pre-diabetes   . RLS (restless legs syndrome)   . Shortness of breath    with exertion    Past Surgical History:  Procedure Laterality Date  . AORTIC VALVE REPLACEMENT    . COMPRESSION HIP SCREW Left 12/26/2014   Procedure: ORIF LEFT HIP INTERTROCHANTRIC FRACTURE;  Surgeon: Frederik Pear, MD;  Location: [facility];  Service: Orthopedics;  Laterality: Left;  . CORONARY ARTERY BYPASS GRAFT  03/2011   1990's also  .  LAPAROSCOPIC CHOLECYSTECTOMY    . MITRAL VALVE REPLACEMENT     1990's  . TONSILLECTOMY    . VIDEO BRONCHOSCOPY WITH ENDOBRONCHIAL NAVIGATION N/A 01/06/2014   Procedure: VIDEO BRONCHOSCOPY WITH ENDOBRONCHIAL NAVIGATION;  Surgeon: [name], MD;  Location: [facility];  Service: Thoracic;  Laterality: N/A;  . VIDEO BRONCHOSCOPY WITH ENDOBRONCHIAL NAVIGATION N/A 11/21/2014   Procedure: VIDEO BRONCHOSCOPY WITH ENDOBRONCHIAL NAVIGATION;  Surgeon: [name], MD;  Location: [facility];  Service: Thoracic;  Laterality: N/A;  . VIDEO BRONCHOSCOPY WITH ENDOBRONCHIAL ULTRASOUND N/A 01/06/2014   Procedure: VIDEO BRONCHOSCOPY WITH ENDOBRONCHIAL ULTRASOUND;  Surgeon: [name], MD;  Location: [facility];  Service: Thoracic;  Laterality: N/A;  . VIDEO BRONCHOSCOPY WITH ENDOBRONCHIAL ULTRASOUND N/A 11/21/2014   Procedure: VIDEO BRONCHOSCOPY WITH ENDOBRONCHIAL ULTRASOUND;  Surgeon: [name], MD;  Location: [facility];  Service: Thoracic;  Laterality: N/A;     reports that she has quit smoking. Her smoking use included Cigarettes. She has a 15.00 pack-year smoking history. She has never used smokeless tobacco. She reports that she does not drink alcohol or use drugs.  Allergies  Allergen Reactions  . Coumadin [Warfarin Sodium] Other (See Comments)    Reaction: Bleeding from ears  . Prozac [Fluoxetine Hcl] Rash    Family History  Problem  Relation Age of Onset  . Adopted: Yes  . Cancer Brother     lung  . Heart attack Neg Hx   . Stroke Neg Hx     Prior to Admission medications   Medication Sig Start Date End Date Taking? Authorizing Provider  acetaminophen (TYLENOL) 325 MG tablet Take 2 tablets (650 mg total) by mouth every 6 (six) hours as needed for mild pain (or Fever >/= 101). 12/29/14  Yes Delfina Redwood, MD  alendronate (FOSAMAX) 70 MG tablet Take 70 mg by mouth once a week.  09/16/14  Yes Historical Provider, MD  aspirin EC 81 MG tablet Take 81 mg by mouth every morning.    Yes Historical Provider, MD  atorvastatin (LIPITOR) 40 MG tablet Take 40 mg by mouth daily. For HLD   Yes Historical Provider, MD  CALCIUM CARBONATE PO Take 1 tablet by mouth 2 (two) times daily.   Yes Historical Provider, MD  docusate sodium (COLACE) 100 MG capsule Take 100 mg by mouth daily as needed for mild constipation.   Yes Historical Provider, MD  esomeprazole (NEXIUM) 40 MG capsule Take 40 mg by mouth daily as needed (for acid reflux).    Yes Historical Provider, MD  fluticasone-salmeterol (ADVAIR HFA) 115-21 MCG/ACT inhaler Inhale 2 puffs into the lungs 2 (two) times daily.    Yes Historical Provider, MD  furosemide (LASIX) 40 MG tablet Take 40 mg by mouth in the morning and take 20 mg by mouth in the evening for fluid.   Yes Historical Provider, MD  glimepiride (AMARYL) 1 MG tablet Take 1 mg by mouth daily with breakfast.   Yes Historical Provider, MD  HYDROcodone-homatropine (HYCODAN) 5-1.5 MG/5ML syrup Take 5 mLs by mouth every 6 (six) hours as needed for cough. 06/15/15  Yes Curt Bears, MD  Ipratropium-Albuterol (COMBIVENT) 20-100 MCG/ACT AERS respimat Inhale 1 puff into the lungs every 6 (six) hours as needed for wheezing or shortness of breath.    Yes Historical Provider, MD  ipratropium-albuterol (DUONEB) 0.5-2.5 (3) MG/3ML SOLN Take 3 mLs by nebulization every 6 (six) hours as needed (for shortness of breath).    Yes Historical Provider, MD  levothyroxine (SYNTHROID, LEVOTHROID) 100 MCG tablet Take 100 mcg by mouth daily before breakfast.    Yes Historical Provider, MD  Multiple Vitamin (MULTIVITAMIN WITH MINERALS) TABS tablet Take 1 tablet by mouth daily.   Yes Historical Provider, MD  potassium chloride SA (K-DUR,KLOR-CON) 20 MEQ tablet Take 1 tablet (20 mEq total) by mouth daily. 08/25/15  Yes Curt Bears, MD  rOPINIRole (REQUIP) 4 MG tablet Take 4 mg by mouth at bedtime.   Yes Historical Provider, MD  TOPROL XL 50 MG 24 hr tablet TAKE 1 TABLET EVERY EVENING (DISCONTINUE  CARVEDIOLOL PRESCRIPTION) 11/26/15  Yes Jerline Pain, MD  vitamin C (ASCORBIC ACID) 500 MG tablet Take 500 mg by mouth daily.   Yes Historical Provider, MD    Physical Exam: Vitals:   12/07/15 2246 12/08/15 0115 12/08/15 0130 12/08/15 0145  BP: 143/72 133/86 138/58 143/98  Pulse: 80 67 72 (!) 49  Resp: '16 21 22 26  '$ Temp:      TempSrc:      SpO2: 98% 100% 100% 99%      Constitutional: Not in distress. Vitals:   12/07/15 2246 12/08/15 0115 12/08/15 0130 12/08/15 0145  BP: 143/72 133/86 138/58 143/98  Pulse: 80 67 72 (!) 49  Resp: '16 21 22 26  '$ Temp:      TempSrc:  SpO2: 98% 100% 100% 99%   Eyes: Anicteric no pallor. ENMT: No discharge from the ears eyes nose or mouth. Neck: No mass felt. No JVD appreciated. Respiratory: No rhonchi or crepitations. Cardiovascular: S1-S2 heard. Abdomen: Soft nontender bowel sounds present. Musculoskeletal: No edema. Skin: No rash. Neurologic: Alert awake oriented to time place and person. Moves all extremities. Psychiatric: Appears normal.   Labs on Admission: I have personally reviewed following labs and imaging studies  CBC:  Recent Labs Lab 12/07/15 0740 12/07/15 1736  WBC 8.8 9.5  NEUTROABS 7.0*  --   HGB 10.8* 11.1*  HCT 34.0* 37.5  MCV 75.4* 80.6  PLT 209 606   Basic Metabolic Panel:  Recent Labs Lab 12/07/15 0740 12/07/15 1736  NA 145 140  K 3.5 3.6  CL  --  104  CO2 28 26  GLUCOSE 107 198*  BUN 23.1 21*  CREATININE 0.8 0.85  CALCIUM 9.3 9.5   GFR: CrCl cannot be calculated (Unknown ideal weight.). Liver Function Tests:  Recent Labs Lab 12/07/15 0740  AST 19  ALT 19  ALKPHOS 127  BILITOT 0.62  PROT 6.9  ALBUMIN 3.2*   No results for input(s): LIPASE, AMYLASE in the last 168 hours. No results for input(s): AMMONIA in the last 168 hours. Coagulation Profile: No results for input(s): INR, PROTIME in the last 168 hours. Cardiac Enzymes: No results for input(s): CKTOTAL, CKMB, CKMBINDEX,  TROPONINI in the last 168 hours. BNP (last 3 results) No results for input(s): PROBNP in the last 8760 hours. HbA1C: No results for input(s): HGBA1C in the last 72 hours. CBG: No results for input(s): GLUCAP in the last 168 hours. Lipid Profile: No results for input(s): CHOL, HDL, LDLCALC, TRIG, CHOLHDL, LDLDIRECT in the last 72 hours. Thyroid Function Tests: No results for input(s): TSH, T4TOTAL, FREET4, T3FREE, THYROIDAB in the last 72 hours. Anemia Panel: No results for input(s): VITAMINB12, FOLATE, FERRITIN, TIBC, IRON, RETICCTPCT in the last 72 hours. Urine analysis:    Component Value Date/Time   LABSPEC 1.015 01/22/2015 1048   PHURINE 6.0 01/22/2015 1048   GLUCOSEU Negative 01/22/2015 1048   HGBUR Large 01/22/2015 1048   BILIRUBINUR Negative 01/22/2015 1048   KETONESUR Negative 01/22/2015 1048   PROTEINUR 30 01/22/2015 1048   UROBILINOGEN 0.2 01/22/2015 1048   NITRITE Positive 01/22/2015 1048   LEUKOCYTESUR Large 01/22/2015 1048   Sepsis Labs: '@LABRCNTIP'$ (procalcitonin:4,lacticidven:4) )No results found for this or any previous visit (from the past 240 hour(s)).   Radiological Exams on Admission: Dg Chest 2 View  Result Date: 12/07/2015 CLINICAL DATA:  Worsening shortness of breath.  Known lung cancer. EXAM: CHEST  2 VIEW COMPARISON:  Chest CT from the same date. FINDINGS: The heart is normal in size and stable. Stable tortuosity and calcification of the thoracic aorta. Extensive tumor, radiation fibrosis and pleural fluid involving the right hemi thorax. The left lung is relatively clear. Minimal left basilar atelectasis. Stable surgical changes from valve replacement surgery. The bony thorax is grossly intact. IMPRESSION: Extensive radiation changes, tumor and pleural effusion involving the right hemi thorax. Electronically Signed   By: Marijo Sanes M.D.   On: 12/07/2015 18:23   Ct Chest W Contrast  Result Date: 12/07/2015 CLINICAL DATA:  Lung cancer, chemotherapy and  radiation therapy complete. Shortness of breath. EXAM: CT CHEST WITH CONTRAST TECHNIQUE: Multidetector CT imaging of the chest was performed during intravenous contrast administration. CONTRAST:  8m ISOVUE-300 IOPAMIDOL (ISOVUE-300) INJECTION 61% COMPARISON:  PET 07/06/2015 and CT chest 06/01/2015. FINDINGS: Cardiovascular:  Atherosclerotic calcification of the arterial vasculature. A large low-attenuation defect is seen in the posterior aspect of the left atrium, measuring 2.4 x 5.3 cm, new. Heart is at the upper limits of normal in size. No pericardial effusion. Mediastinum/Nodes: Hazy soft tissue fullness is seen in the low right internal jugular station (series 2, images 10-15), difficult to measure. Finding appears new from 06/01/2015. Low right paratracheal lymph node measures 1.7 cm, possibly stable but somewhat poorly defined on the prior study. Soft tissue is seen in the right hilum without a discrete lymph node. No axillary adenopathy. Pre pericardiac/juxta diaphragmatic lymph nodes are sub cm in short axis size. Lungs/Pleura: Increased masslike consolidation in the right upper lobe, difficult to measure, with an enlarging loculated right pleural effusion. Likely radiation fibrosis in the perihilar right hemi thorax. Possible necrotic subpleural nodule in the anterior segment right upper lobe, measuring 2.6 cm (images 47-48), new. Previously seen hypermetabolic right lower lobe pulmonary nodule is no longer readily identified. Persistent volume loss in the left lower lobe. Centrilobular emphysema, mild. Upper Abdomen: Visualized portions of the liver, adrenal glands, kidneys, spleen, pancreas, stomach and bowel are grossly unremarkable. No upper abdominal adenopathy. Cholecystectomy. Musculoskeletal: Likely pathologic fracture and associated healing involving the right third lateral rib (series 2, image 34). IMPRESSION: 1. Large left atrial bland versus tumor thrombus, new. Critical Value/emergent results  were called by telephone at the time of interpretation on 12/07/2015 at 9:16 am to Dr. Curt Bears , who verbally acknowledged these results. 2. Increasing masslike consolidation and possible separate new necrotic nodule in the right upper lobe with increasing right loculated pleural effusion and new low right internal jugular ill-defined soft tissue, findings all worrisome for disease progression. 3. Previously seen hypermetabolic right lower lobe nodule is no longer identified. 4. Likely healing pathologic fracture of the lateral right third rib. 5. Aortic atherosclerosis. Electronically Signed   By: Lorin Picket M.D.   On: 12/07/2015 09:17    EKG: Independently reviewed. A. fib with PVCs.  Assessment/Plan Principal Problem:   Left atrial thrombus (H[facility]) Active Problems:   A-fib (H[facility])   Hypothyroidism   COPD (chronic obstructive pulmonary disease) ([facility])   Primary cancer of right upper lobe of lung (HCC)   Type 2 diabetes mellitus ([place])   Aortic valve replaced   Mitral valve replaced    1. Possible left atrial thrombus - for now patient is placed on IV heparin. I have ordered 2-D echo. Consult cardiology in a.m. 2. Diabetes mellitus type 2 - on Amaryl and sliding scale coverage. 3. Chronic atrial fibrillation - on metoprolol. Patient has previously refused anticoagulation secondary to bleeding. 4. COPD - presently not wheezing. 5. CAD status post CABG - denies any chest pain. Patient is on aspirin and statins and metoprolol. 6. Non-small cell lung cancer being followed by Dr. Julien Nordmann, oncologist. 7. Hypothyroidism on Synthroid.   DVT prophylaxis: Heparin. Code Status: Full code.  Family Communication: Discussed with patient.  Disposition Plan: Home.  Consults called: None.  Admission status: Observation.    Rise Patience MD [facility] Pager [phone].  If 7PM-7AM, please contact night-coverage [email or web address] Password [email or web address]  12/08/2015, 3:30 AM

## 2015-12-08 NOTE — ED Notes (Signed)
Ordered meal tray. 

## 2015-12-08 NOTE — Progress Notes (Signed)
[facility] for Heparin Indication: Left atrial thrombus  Allergies  Allergen Reactions  . Coumadin [Warfarin Sodium] Other (See Comments)    Reaction: Bleeding from ears  . Prozac [Fluoxetine Hcl] Rash    Patient Measurements: Height: '5\' 3"'$  (160 cm) Weight: 132 lb 6.4 oz (60.1 kg) IBW/kg (Calculated) : 52.4 Heparin Dosing Weight: 59 kg  Vital Signs: Temp: 97.8 F (36.6 C) (09/19 1032) Temp Source: Oral (09/19 1032) BP: 117/55 (09/19 1223) Pulse Rate: 85 (09/19 1032)  Labs:  Recent Labs  12/07/15 0740 12/07/15 1736 12/08/15 0503  HGB 10.8* 11.1* 10.1*  HCT 34.0* 37.5 33.9*  PLT 209 224 207  CREATININE 0.8 0.85 0.74  TROPONINI  --   --  <0.03    Estimated Creatinine Clearance: 46.4 mL/min (by C-G formula based on SCr of 0.74 mg/dL).   Medical History: Past Medical History:  Diagnosis Date  . A-fib ([place])   . Aortic stenosis    (s/p AVR w/ 21 mm porcine valve 03/2011  . Aortic valve disorders   . Arthritis   . CAD (coronary artery disease)    s/p CABG 03/2011  . CHF (congestive heart failure) ([place])   . COPD (chronic obstructive pulmonary disease) ([place])   . Diabetes mellitus without complication ([place])   . Dysrhythmia   . Full code status 12/22/2014  . Full code status 12/22/2014  . GERD (gastroesophageal reflux disease)    takes nexium prn  . History of blood transfusion    Rheumatic Fever  . HTN (hypertension)   . Hypercholesteremia   . Hypothyroidism   . Lung cancer ([place])   . Mastoiditis   . Mitral valve disease    s/p MVR 03/2011 w/ 27 mm porcine valve  . Myocardial infarction ([place])    "slight"  . Osteopenia   . Pneumonia   . Pre-diabetes   . RLS (restless legs syndrome)   . Shortness of breath    with exertion    Medications:  No current facility-administered medications on file prior to encounter.    Current Outpatient Prescriptions on File Prior to Encounter  Medication Sig Dispense Refill  .  acetaminophen (TYLENOL) 325 MG tablet Take 2 tablets (650 mg total) by mouth every 6 (six) hours as needed for mild pain (or Fever >/= 101).    [name] alendronate (FOSAMAX) 70 MG tablet Take 70 mg by mouth once a week.     [name] atorvastatin (LIPITOR) 40 MG tablet Take 40 mg by mouth daily. For HLD    . CALCIUM CARBONATE PO Take 1 tablet by mouth 2 (two) times daily.    [name] docusate sodium (COLACE) 100 MG capsule Take 100 mg by mouth daily as needed for mild constipation.    [name] esomeprazole (NEXIUM) 40 MG capsule Take 40 mg by mouth daily as needed (for acid reflux).     . fluticasone-salmeterol (ADVAIR HFA) 115-21 MCG/ACT inhaler Inhale 2 puffs into the lungs 2 (two) times daily.     . furosemide (LASIX) 40 MG tablet Take 40 mg by mouth in the morning and take 20 mg by mouth in the evening for fluid.    [name] glimepiride (AMARYL) 1 MG tablet Take 1 mg by mouth daily with breakfast.    . HYDROcodone-homatropine (HYCODAN) 5-1.5 MG/5ML syrup Take 5 mLs by mouth every 6 (six) hours as needed for cough. 120 mL 0  . Ipratropium-Albuterol (COMBIVENT) 20-100 MCG/ACT AERS respimat Inhale 1 puff into the lungs every 6 (six) hours  as needed for wheezing or shortness of breath.     [name] ipratropium-albuterol (DUONEB) 0.5-2.5 (3) MG/3ML SOLN Take 3 mLs by nebulization every 6 (six) hours as needed (for shortness of breath).     [name] levothyroxine (SYNTHROID, LEVOTHROID) 100 MCG tablet Take 100 mcg by mouth daily before breakfast.     . potassium chloride SA (K-DUR,KLOR-CON) 20 MEQ tablet Take 1 tablet (20 mEq total) by mouth daily. 7 tablet 0  . rOPINIRole (REQUIP) 4 MG tablet Take 4 mg by mouth at bedtime.    . TOPROL XL 50 MG 24 hr tablet TAKE 1 TABLET EVERY EVENING (DISCONTINUE CARVEDIOLOL PRESCRIPTION) 90 tablet 3  . vitamin C (ASCORBIC ACID) 500 MG tablet Take 500 mg by mouth daily.    . [DISCONTINUED] aspirin EC 325 MG tablet Take 1 tablet (325 mg total) by mouth 2 (two) times daily. 30 tablet 0     Assessment: 82 y.o.  female with LA thrombus found on Chest CT  on heparin.  Noted h/o Afib and porcine AVR, not on anticoagulation due to history of bleeding -Heparin level reported to be < 0.1 by lab  Goal of Therapy:  Heparin level 0.3-0.7 units/ml Monitor platelets by anticoagulation protocol: Yes   Plan:  Heparin 2000 units IV bolus, then increase heparin to 1000 units/hr Check heparin level in 8 hours.  Daily heparin level and CBC  Hildred Laser, Pharm D 12/08/2015 1:39 PM

## 2015-12-08 NOTE — ED Provider Notes (Signed)
[facility] DEPT Provider Note   CSN: [id number] Arrival date & time: 12/07/15  1718     History   Chief Complaint Chief Complaint  Patient presents with  . Other    mass on heart    HPI Marissa [name] is a 82 y.o. female.  Patient with complicated medical history including O2 dependent COPD, CAD with bypass, valvular disease with multi-valve replacements, h/o rheumatic fever, lung CA, DM, HTN, HLD, presents after being seen earlier today by Dr. Earlie Server in follow up of lung cancer treatment. A CT chest w/CM performed today showed a "Large left atrial bland versus tumor thrombus, new. She was contacted by Dr. Earlie Server and told to come to the ED for consultation with Dr. Marlou Porch. The patient has no complaints of chest pain, change to baseline SOB, vomiting, fever or pleuritic pain.    The history is provided by the patient and a relative. No language interpreter was used.    Past Medical History:  Diagnosis Date  . A-fib ([place])   . Aortic stenosis    (s/p AVR w/ 21 mm porcine valve 03/2011  . Aortic valve disorders   . Arthritis   . CAD (coronary artery disease)    s/p CABG 03/2011  . CHF (congestive heart failure) ([place])   . COPD (chronic obstructive pulmonary disease) ([place])   . Diabetes mellitus without complication ([place])   . Dysrhythmia   . Full code status 12/22/2014  . Full code status 12/22/2014  . GERD (gastroesophageal reflux disease)    takes nexium prn  . History of blood transfusion    Rheumatic Fever  . HTN (hypertension)   . Hypercholesteremia   . Hypothyroidism   . Lung cancer ([place])   . Mastoiditis   . Mitral valve disease    s/p MVR 03/2011 w/ 27 mm porcine valve  . Myocardial infarction ([place])    "slight"  . Osteopenia   . Pneumonia   . Pre-diabetes   . RLS (restless legs syndrome)   . Shortness of breath    with exertion    Patient Active Problem List   Diagnosis Date Noted  . Cancer of lower lobe of right lung ([place]) 08/03/2015  . Aortic  insufficiency 04/06/2015  . Aortic valve replaced 04/06/2015  . Mitral valve replaced 04/06/2015  . Postoperative anemia due to acute blood loss 01/04/2015  . Type 2 diabetes mellitus ([place]) 12/26/2014  . Closed left hip fracture ([place]) 12/25/2014  . Full code status 12/22/2014  . Full code status 12/22/2014  . Primary cancer of right upper lobe of lung ([place]) 12/19/2013  . Hypertensive heart disease with CHF (congestive heart failure) ([place])   . Hypercholesteremia   . CAD (coronary artery disease)   . Aortic valve disorder   . RLS (restless legs syndrome)   . A-fib ([place])   . Osteopenia   . Mitral valve disease   . Hypothyroidism   . COPD (chronic obstructive pulmonary disease) ([place])   . CHF (congestive heart failure) ([place])   . Aortic stenosis   . Mastoiditis     Past Surgical History:  Procedure Laterality Date  . AORTIC VALVE REPLACEMENT    . COMPRESSION HIP SCREW Left 12/26/2014   Procedure: ORIF LEFT HIP INTERTROCHANTRIC FRACTURE;  Surgeon: Frederik Pear, MD;  Location: [facility];  Service: Orthopedics;  Laterality: Left;  . CORONARY ARTERY BYPASS GRAFT  03/2011   1990's also  . LAPAROSCOPIC CHOLECYSTECTOMY    . MITRAL VALVE REPLACEMENT  1990's  . TONSILLECTOMY    . VIDEO BRONCHOSCOPY WITH ENDOBRONCHIAL NAVIGATION N/A 01/06/2014   Procedure: VIDEO BRONCHOSCOPY WITH ENDOBRONCHIAL NAVIGATION;  Surgeon: [name], MD;  Location: [facility];  Service: Thoracic;  Laterality: N/A;  . VIDEO BRONCHOSCOPY WITH ENDOBRONCHIAL NAVIGATION N/A 11/21/2014   Procedure: VIDEO BRONCHOSCOPY WITH ENDOBRONCHIAL NAVIGATION;  Surgeon: [name], MD;  Location: [facility];  Service: Thoracic;  Laterality: N/A;  . VIDEO BRONCHOSCOPY WITH ENDOBRONCHIAL ULTRASOUND N/A 01/06/2014   Procedure: VIDEO BRONCHOSCOPY WITH ENDOBRONCHIAL ULTRASOUND;  Surgeon: [name], MD;  Location: [employer];  Service: Thoracic;  Laterality: N/A;  . VIDEO BRONCHOSCOPY WITH ENDOBRONCHIAL ULTRASOUND N/A 11/21/2014    Procedure: VIDEO BRONCHOSCOPY WITH ENDOBRONCHIAL ULTRASOUND;  Surgeon: [name], MD;  Location: [facility];  Service: Thoracic;  Laterality: N/A;    OB History    No data available       Home Medications    Prior to Admission medications   Medication Sig Start Date End Date Taking? Authorizing Provider  acetaminophen (TYLENOL) 325 MG tablet Take 2 tablets (650 mg total) by mouth every 6 (six) hours as needed for mild pain (or Fever >/= 101). 12/29/14   Delfina Redwood, MD  alendronate (FOSAMAX) 70 MG tablet Take 70 mg by mouth once a week.  09/16/14   Historical Provider, MD  aspirin EC 325 MG tablet Take 1 tablet (325 mg total) by mouth 2 (two) times daily. 12/26/14   Leighton Parody, PA-C  atorvastatin (LIPITOR) 40 MG tablet Take 40 mg by mouth daily. For HLD    Historical Provider, MD  CALCIUM CARBONATE PO Take 1 tablet by mouth 2 (two) times daily.    Historical Provider, MD  docusate sodium (COLACE) 100 MG capsule Take 100 mg by mouth daily as needed for mild constipation.    Historical Provider, MD  esomeprazole (NEXIUM) 40 MG capsule Take 40 mg by mouth daily as needed (for acid reflux).     Historical Provider, MD  fluticasone-salmeterol (ADVAIR HFA) 115-21 MCG/ACT inhaler Inhale 2 puffs into the lungs 2 (two) times daily.     Historical Provider, MD  furosemide (LASIX) 40 MG tablet Take 40 mg by mouth in the morning and take 20 mg by mouth in the evening for fluid.    Historical Provider, MD  glimepiride (AMARYL) 1 MG tablet Take 1 mg by mouth daily with breakfast.    Historical Provider, MD  HYDROcodone-homatropine (HYCODAN) 5-1.5 MG/5ML syrup Take 5 mLs by mouth every 6 (six) hours as needed for cough. 06/15/15   Curt Bears, MD  Ipratropium-Albuterol (COMBIVENT) 20-100 MCG/ACT AERS respimat Inhale 1 puff into the lungs every 6 (six) hours as needed for wheezing or shortness of breath.     Historical Provider, MD  ipratropium-albuterol (DUONEB) 0.5-2.5 (3) MG/3ML SOLN  Take 3 mLs by nebulization every 6 (six) hours as needed (for shortness of breath).     Historical Provider, MD  levothyroxine (SYNTHROID, LEVOTHROID) 100 MCG tablet Take 100 mcg by mouth daily before breakfast.     Historical Provider, MD  potassium chloride SA (K-DUR,KLOR-CON) 20 MEQ tablet Take 1 tablet (20 mEq total) by mouth daily. 08/25/15   Curt Bears, MD  rOPINIRole (REQUIP) 4 MG tablet Take 4 mg by mouth at bedtime.    Historical Provider, MD  TOPROL XL 50 MG 24 hr tablet TAKE 1 TABLET EVERY EVENING (DISCONTINUE CARVEDIOLOL PRESCRIPTION) 11/26/15   Jerline Pain, MD  vitamin C (ASCORBIC ACID) 500 MG tablet  Take 500 mg by mouth daily.    Historical Provider, MD    Family History Family History  Problem Relation Age of Onset  . Adopted: Yes  . Cancer Brother     lung  . Heart attack Neg Hx   . Stroke Neg Hx     Social History Social History  Substance Use Topics  . Smoking status: Former Smoker    Packs/day: 0.50    Years: 30.00    Types: Cigarettes  . Smokeless tobacco: Never Used  . Alcohol use No     Allergies   Coumadin [warfarin sodium] and Prozac [fluoxetine hcl]   Review of Systems Review of Systems  Constitutional: Negative for chills and fever.  HENT: Negative.   Respiratory: Positive for shortness of breath.   Cardiovascular: Negative.  Negative for chest pain.  Gastrointestinal: Negative.  Negative for nausea.  Musculoskeletal: Negative.   Skin: Negative.   Neurological: Negative.      Physical Exam Updated Vital Signs BP 133/86   Pulse 67   Temp 98.5 F (36.9 C) (Oral)   Resp 21   SpO2 100%   Physical Exam  Constitutional: She is oriented to person, place, and time. She appears well-developed and well-nourished.  HENT:  Head: Normocephalic.  Neck: Normal range of motion. Neck supple.  Cardiovascular: Normal rate and regular rhythm.   Murmur heard. Pulmonary/Chest: Effort normal. She has no wheezes. She has rales.  Abdominal: Soft.  Bowel sounds are normal. There is no tenderness. There is no rebound and no guarding.  Musculoskeletal: Normal range of motion. She exhibits no edema.  Neurological: She is alert and oriented to person, place, and time.  Skin: Skin is warm and dry. No rash noted.  Psychiatric: She has a normal mood and affect.     ED Treatments / Results  Labs (all labs ordered are listed, but only abnormal results are displayed) Labs Reviewed  BASIC METABOLIC PANEL - Abnormal; Notable for the following:       Result Value   Glucose, Bld 198 (*)    BUN 21 (*)    All other components within normal limits  CBC - Abnormal; Notable for the following:    Hemoglobin 11.1 (*)    MCH 23.9 (*)    MCHC 29.6 (*)    RDW 17.1 (*)    All other components within normal limits  Randolm Idol, ED   Dg Chest 2 View  Result Date: 12/07/2015 CLINICAL DATA:  Worsening shortness of breath.  Known lung cancer. EXAM: CHEST  2 VIEW COMPARISON:  Chest CT from the same date. FINDINGS: The heart is normal in size and stable. Stable tortuosity and calcification of the thoracic aorta. Extensive tumor, radiation fibrosis and pleural fluid involving the right hemi thorax. The left lung is relatively clear. Minimal left basilar atelectasis. Stable surgical changes from valve replacement surgery. The bony thorax is grossly intact. IMPRESSION: Extensive radiation changes, tumor and pleural effusion involving the right hemi thorax. Electronically Signed   By: Marijo Sanes M.D.   On: 12/07/2015 18:23   Ct Chest W Contrast  Result Date: 12/07/2015 CLINICAL DATA:  Lung cancer, chemotherapy and radiation therapy complete. Shortness of breath. EXAM: CT CHEST WITH CONTRAST TECHNIQUE: Multidetector CT imaging of the chest was performed during intravenous contrast administration. CONTRAST:  59m ISOVUE-300 IOPAMIDOL (ISOVUE-300) INJECTION 61% COMPARISON:  PET 07/06/2015 and CT chest 06/01/2015. FINDINGS: Cardiovascular: Atherosclerotic  calcification of the arterial vasculature. A large low-attenuation defect is seen in the  posterior aspect of the left atrium, measuring 2.4 x 5.3 cm, new. Heart is at the upper limits of normal in size. No pericardial effusion. Mediastinum/Nodes: Hazy soft tissue fullness is seen in the low right internal jugular station (series 2, images 10-15), difficult to measure. Finding appears new from 06/01/2015. Low right paratracheal lymph node measures 1.7 cm, possibly stable but somewhat poorly defined on the prior study. Soft tissue is seen in the right hilum without a discrete lymph node. No axillary adenopathy. Pre pericardiac/juxta diaphragmatic lymph nodes are sub cm in short axis size. Lungs/Pleura: Increased masslike consolidation in the right upper lobe, difficult to measure, with an enlarging loculated right pleural effusion. Likely radiation fibrosis in the perihilar right hemi thorax. Possible necrotic subpleural nodule in the anterior segment right upper lobe, measuring 2.6 cm (images 47-48), new. Previously seen hypermetabolic right lower lobe pulmonary nodule is no longer readily identified. Persistent volume loss in the left lower lobe. Centrilobular emphysema, mild. Upper Abdomen: Visualized portions of the liver, adrenal glands, kidneys, spleen, pancreas, stomach and bowel are grossly unremarkable. No upper abdominal adenopathy. Cholecystectomy. Musculoskeletal: Likely pathologic fracture and associated healing involving the right third lateral rib (series 2, image 34). IMPRESSION: 1. Large left atrial bland versus tumor thrombus, new. Critical Value/emergent results were called by telephone at the time of interpretation on 12/07/2015 at 9:16 am to Dr. Curt Bears , who verbally acknowledged these results. 2. Increasing masslike consolidation and possible separate new necrotic nodule in the right upper lobe with increasing right loculated pleural effusion and new low right internal jugular ill-defined  soft tissue, findings all worrisome for disease progression. 3. Previously seen hypermetabolic right lower lobe nodule is no longer identified. 4. Likely healing pathologic fracture of the lateral right third rib. 5. Aortic atherosclerosis. Electronically Signed   By: Lorin Picket M.D.   On: 12/07/2015 09:17    EKG  EKG Interpretation None       Radiology Dg Chest 2 View  Result Date: 12/07/2015 CLINICAL DATA:  Worsening shortness of breath.  Known lung cancer. EXAM: CHEST  2 VIEW COMPARISON:  Chest CT from the same date. FINDINGS: The heart is normal in size and stable. Stable tortuosity and calcification of the thoracic aorta. Extensive tumor, radiation fibrosis and pleural fluid involving the right hemi thorax. The left lung is relatively clear. Minimal left basilar atelectasis. Stable surgical changes from valve replacement surgery. The bony thorax is grossly intact. IMPRESSION: Extensive radiation changes, tumor and pleural effusion involving the right hemi thorax. Electronically Signed   By: Marijo Sanes M.D.   On: 12/07/2015 18:23   Ct Chest W Contrast  Result Date: 12/07/2015 CLINICAL DATA:  Lung cancer, chemotherapy and radiation therapy complete. Shortness of breath. EXAM: CT CHEST WITH CONTRAST TECHNIQUE: Multidetector CT imaging of the chest was performed during intravenous contrast administration. CONTRAST:  40m ISOVUE-300 IOPAMIDOL (ISOVUE-300) INJECTION 61% COMPARISON:  PET 07/06/2015 and CT chest 06/01/2015. FINDINGS: Cardiovascular: Atherosclerotic calcification of the arterial vasculature. A large low-attenuation defect is seen in the posterior aspect of the left atrium, measuring 2.4 x 5.3 cm, new. Heart is at the upper limits of normal in size. No pericardial effusion. Mediastinum/Nodes: Hazy soft tissue fullness is seen in the low right internal jugular station (series 2, images 10-15), difficult to measure. Finding appears new from 06/01/2015. Low right paratracheal lymph  node measures 1.7 cm, possibly stable but somewhat poorly defined on the prior study. Soft tissue is seen in the right hilum without a discrete  lymph node. No axillary adenopathy. Pre pericardiac/juxta diaphragmatic lymph nodes are sub cm in short axis size. Lungs/Pleura: Increased masslike consolidation in the right upper lobe, difficult to measure, with an enlarging loculated right pleural effusion. Likely radiation fibrosis in the perihilar right hemi thorax. Possible necrotic subpleural nodule in the anterior segment right upper lobe, measuring 2.6 cm (images 47-48), new. Previously seen hypermetabolic right lower lobe pulmonary nodule is no longer readily identified. Persistent volume loss in the left lower lobe. Centrilobular emphysema, mild. Upper Abdomen: Visualized portions of the liver, adrenal glands, kidneys, spleen, pancreas, stomach and bowel are grossly unremarkable. No upper abdominal adenopathy. Cholecystectomy. Musculoskeletal: Likely pathologic fracture and associated healing involving the right third lateral rib (series 2, image 34). IMPRESSION: 1. Large left atrial bland versus tumor thrombus, new. Critical Value/emergent results were called by telephone at the time of interpretation on 12/07/2015 at 9:16 am to Dr. Curt Bears , who verbally acknowledged these results. 2. Increasing masslike consolidation and possible separate new necrotic nodule in the right upper lobe with increasing right loculated pleural effusion and new low right internal jugular ill-defined soft tissue, findings all worrisome for disease progression. 3. Previously seen hypermetabolic right lower lobe nodule is no longer identified. 4. Likely healing pathologic fracture of the lateral right third rib. 5. Aortic atherosclerosis. Electronically Signed   By: Lorin Picket M.D.   On: 12/07/2015 09:17    Procedures Procedures (including critical care time)  Medications Ordered in ED Medications - No data to  display   Initial Impression / Assessment and Plan / ED Course  I have reviewed the triage vital signs and the nursing notes.  Pertinent labs & imaging results that were available during my care of the patient were reviewed by me and considered in my medical decision making (see chart for details).  Clinical Course    Patient with complicated medical history presents with abnormal contrasted CT chest done in follow up of lung cancer treatment today showing a "left atrial bland vs tumor thrombus". Patient is comfortable and asymptomatic. VSS. Discussed with cardiology who advised patient can be admitted to medicine and they will consult in the morning. Patient started on Heparin per pharmacy.   Final Clinical Impressions(s) / ED Diagnoses   Final diagnoses:  None  1. Left atrial thrombus  New Prescriptions New Prescriptions   No medications on file     Charlann Lange, PA-C 12/08/15 0155    [name], MD 12/08/15 [phone]

## 2015-12-08 NOTE — Progress Notes (Signed)
ANTICOAGULATION CONSULT NOTE - Initial Consult  Pharmacy Consult for Heparin Indication: Left atrial thrombus  Allergies  Allergen Reactions  . Coumadin [Warfarin Sodium] Other (See Comments)    Reaction: Bleeding from ears  . Prozac [Fluoxetine Hcl] Rash    Patient Measurements:   Heparin Dosing Weight: 59 kg  Vital Signs: Temp: 98.5 F (36.9 C) (09/18 1900) Temp Source: Oral (09/18 1900) BP: 143/98 (09/19 0145) Pulse Rate: 49 (09/19 0145)  Labs:  Recent Labs  12/07/15 0740 12/07/15 1736  HGB 10.8* 11.1*  HCT 34.0* 37.5  PLT 209 224  CREATININE 0.8 0.85    CrCl cannot be calculated (Unknown ideal weight.).   Medical History: Past Medical History:  Diagnosis Date  . A-fib ([place])   . Aortic stenosis    (s/p AVR w/ 21 mm porcine valve 03/2011  . Aortic valve disorders   . Arthritis   . CAD (coronary artery disease)    s/p CABG 03/2011  . CHF (congestive heart failure) ([place])   . COPD (chronic obstructive pulmonary disease) ([place])   . Diabetes mellitus without complication ([place])   . Dysrhythmia   . Full code status 12/22/2014  . Full code status 12/22/2014  . GERD (gastroesophageal reflux disease)    takes nexium prn  . History of blood transfusion    Rheumatic Fever  . HTN (hypertension)   . Hypercholesteremia   . Hypothyroidism   . Lung cancer ([place])   . Mastoiditis   . Mitral valve disease    s/p MVR 03/2011 w/ 27 mm porcine valve  . Myocardial infarction ([place])    "slight"  . Osteopenia   . Pneumonia   . Pre-diabetes   . RLS (restless legs syndrome)   . Shortness of breath    with exertion    Medications:  Current Facility-Administered Medications on File Prior to Encounter  Medication Dose Route Frequency Provider Last Rate Last Dose  . [COMPLETED] iopamidol (ISOVUE-300) 61 % injection 75 mL  75 mL Intravenous Once PRN Curt Bears, MD   75 mL at 12/07/15 8841   Current Outpatient Prescriptions on File Prior to Encounter  Medication Sig  Dispense Refill  . acetaminophen (TYLENOL) 325 MG tablet Take 2 tablets (650 mg total) by mouth every 6 (six) hours as needed for mild pain (or Fever >/= 101).    [name] alendronate (FOSAMAX) 70 MG tablet Take 70 mg by mouth once a week.     [name] atorvastatin (LIPITOR) 40 MG tablet Take 40 mg by mouth daily. For HLD    . CALCIUM CARBONATE PO Take 1 tablet by mouth 2 (two) times daily.    [name] docusate sodium (COLACE) 100 MG capsule Take 100 mg by mouth daily as needed for mild constipation.    [name] esomeprazole (NEXIUM) 40 MG capsule Take 40 mg by mouth daily as needed (for acid reflux).     . fluticasone-salmeterol (ADVAIR HFA) 115-21 MCG/ACT inhaler Inhale 2 puffs into the lungs 2 (two) times daily.     . furosemide (LASIX) 40 MG tablet Take 40 mg by mouth in the morning and take 20 mg by mouth in the evening for fluid.    [name] glimepiride (AMARYL) 1 MG tablet Take 1 mg by mouth daily with breakfast.    . HYDROcodone-homatropine (HYCODAN) 5-1.5 MG/5ML syrup Take 5 mLs by mouth every 6 (six) hours as needed for cough. 120 mL 0  . Ipratropium-Albuterol (COMBIVENT) 20-100 MCG/ACT AERS respimat Inhale 1 puff into the lungs every 6 (six)  hours as needed for wheezing or shortness of breath.     [name] ipratropium-albuterol (DUONEB) 0.5-2.5 (3) MG/3ML SOLN Take 3 mLs by nebulization every 6 (six) hours as needed (for shortness of breath).     [name] levothyroxine (SYNTHROID, LEVOTHROID) 100 MCG tablet Take 100 mcg by mouth daily before breakfast.     . potassium chloride SA (K-DUR,KLOR-CON) 20 MEQ tablet Take 1 tablet (20 mEq total) by mouth daily. 7 tablet 0  . rOPINIRole (REQUIP) 4 MG tablet Take 4 mg by mouth at bedtime.    . TOPROL XL 50 MG 24 hr tablet TAKE 1 TABLET EVERY EVENING (DISCONTINUE CARVEDIOLOL PRESCRIPTION) 90 tablet 3  . vitamin C (ASCORBIC ACID) 500 MG tablet Take 500 mg by mouth daily.       Assessment: 82 y.o. female with LA thrombus found on Chest CT today for heparin.  Noted h/o Afib, not on  anticoagulation due to prior inner ear bleeding  Goal of Therapy:  Heparin level 0.3-0.7 units/ml Monitor platelets by anticoagulation protocol: Yes   Plan:  Heparin 2000 units IV bolus, then start heparin 800 units/hr Check heparin level in 8 hours.   [name] 12/08/2015,2:22 AM

## 2015-12-08 NOTE — ED Notes (Signed)
Patient assisted to use bedside commode, patient tolerated well. Patients depends changed and clothes placed in belongings bag. Patient is aware of POC for admission. Daughter has taken patients purse home with her.

## 2015-12-09 ENCOUNTER — Inpatient Hospital Stay (HOSPITAL_COMMUNITY): Payer: Medicare Other

## 2015-12-09 DIAGNOSIS — E039 Hypothyroidism, unspecified: Secondary | ICD-10-CM

## 2015-12-09 DIAGNOSIS — I350 Nonrheumatic aortic (valve) stenosis: Secondary | ICD-10-CM

## 2015-12-09 LAB — BASIC METABOLIC PANEL
Anion gap: 7 (ref 5–15)
BUN: 15 mg/dL (ref 6–20)
CALCIUM: 8.8 mg/dL — AB (ref 8.9–10.3)
CO2: 31 mmol/L (ref 22–32)
CREATININE: 0.81 mg/dL (ref 0.44–1.00)
Chloride: 100 mmol/L — ABNORMAL LOW (ref 101–111)
GFR calc non Af Amer: >60 mL/min (ref >60)
Glucose, Bld: 80 mg/dL (ref 65–99)
Potassium: 3.2 mmol/L — ABNORMAL LOW (ref 3.5–5.1)
Sodium: 138 mmol/L (ref 135–145)

## 2015-12-09 LAB — HEPARIN LEVEL (UNFRACTIONATED)
HEPARIN UNFRACTIONATED: 0.19 [IU]/mL — AB (ref 0.30–0.70)
Heparin Unfractionated: 0.43 IU/mL (ref 0.30–0.70)
Heparin Unfractionated: 0.77 IU/mL — ABNORMAL HIGH (ref 0.30–0.70)

## 2015-12-09 LAB — GLUCOSE, CAPILLARY
Glucose-Capillary: 76 mg/dL (ref 65–99)
Glucose-Capillary: 85 mg/dL (ref 65–99)
Glucose-Capillary: 99 mg/dL (ref 65–99)
Glucose-Capillary: 235 mg/dL — ABNORMAL HIGH (ref 65–99)

## 2015-12-09 LAB — CBC
HEMATOCRIT: 35.5 % — AB (ref 36.0–46.0)
Hemoglobin: 10.7 g/dL — ABNORMAL LOW (ref 12.0–15.0)
MCH: 24 pg — ABNORMAL LOW (ref 26.0–34.0)
MCHC: 30.1 g/dL (ref 30.0–36.0)
MCV: 79.8 fL (ref 78.0–100.0)
Platelets: 200 10*3/uL (ref 150–400)
RBC: 4.45 MIL/uL (ref 3.87–5.11)
RDW: 16.8 % — AB (ref 11.5–15.5)
WBC: 8.2 10*3/uL (ref 4.0–10.5)

## 2015-12-09 LAB — ECHOCARDIOGRAM COMPLETE
Height: 63 in
Weight: 2120 oz

## 2015-12-09 LAB — PROTIME-INR
INR: 1.05
Prothrombin Time: 13.7 seconds (ref 11.4–15.2)

## 2015-12-09 MED ORDER — IPRATROPIUM-ALBUTEROL 0.5-2.5 (3) MG/3ML IN SOLN
3.0000 mL | RESPIRATORY_TRACT | Status: DC | PRN
  Administered 2015-12-16: 3 mL via RESPIRATORY_TRACT
  Filled 2015-12-09 (×3): qty 3

## 2015-12-09 MED ORDER — METHYLPREDNISOLONE SODIUM SUCC 125 MG IJ SOLR
60.0000 mg | Freq: Once | INTRAMUSCULAR | Status: AC
  Administered 2015-12-09: 60 mg via INTRAVENOUS
  Filled 2015-12-09: qty 2

## 2015-12-09 MED ORDER — POTASSIUM CHLORIDE CRYS ER 20 MEQ PO TBCR
40.0000 meq | EXTENDED_RELEASE_TABLET | Freq: Once | ORAL | Status: AC
  Administered 2015-12-09: 40 meq via ORAL
  Filled 2015-12-09: qty 2

## 2015-12-09 MED ORDER — PERFLUTREN LIPID MICROSPHERE
1.0000 mL | INTRAVENOUS | Status: AC | PRN
  Administered 2015-12-09: 2 mL via INTRAVENOUS
  Filled 2015-12-09: qty 10

## 2015-12-09 MED ORDER — ARFORMOTEROL TARTRATE 15 MCG/2ML IN NEBU
15.0000 ug | INHALATION_SOLUTION | Freq: Two times a day (BID) | RESPIRATORY_TRACT | Status: DC
  Administered 2015-12-09 – 2015-12-10 (×2): 15 ug via RESPIRATORY_TRACT
  Filled 2015-12-09 (×2): qty 2

## 2015-12-09 MED ORDER — IPRATROPIUM-ALBUTEROL 0.5-2.5 (3) MG/3ML IN SOLN
3.0000 mL | Freq: Two times a day (BID) | RESPIRATORY_TRACT | Status: DC
  Administered 2015-12-10 – 2015-12-16 (×12): 3 mL via RESPIRATORY_TRACT
  Filled 2015-12-09 (×12): qty 3

## 2015-12-09 MED ORDER — IPRATROPIUM-ALBUTEROL 0.5-2.5 (3) MG/3ML IN SOLN
3.0000 mL | Freq: Four times a day (QID) | RESPIRATORY_TRACT | Status: DC
  Administered 2015-12-09 (×2): 3 mL via RESPIRATORY_TRACT
  Filled 2015-12-09: qty 3

## 2015-12-09 MED ORDER — WARFARIN SODIUM 5 MG PO TABS
5.0000 mg | ORAL_TABLET | Freq: Once | ORAL | Status: AC
  Administered 2015-12-09: 5 mg via ORAL
  Filled 2015-12-09: qty 1

## 2015-12-09 MED ORDER — BUDESONIDE 0.25 MG/2ML IN SUSP
0.2500 mg | Freq: Two times a day (BID) | RESPIRATORY_TRACT | Status: DC
  Administered 2015-12-09 – 2015-12-16 (×13): 0.25 mg via RESPIRATORY_TRACT
  Filled 2015-12-09 (×13): qty 2

## 2015-12-09 MED ORDER — PERFLUTREN LIPID MICROSPHERE
INTRAVENOUS | Status: AC
  Filled 2015-12-09: qty 10

## 2015-12-09 MED ORDER — WARFARIN - PHARMACIST DOSING INPATIENT
Freq: Every day | Status: DC
  Administered 2015-12-14: 18:00:00

## 2015-12-09 NOTE — Progress Notes (Addendum)
  Echocardiogram 2D Echocardiogram with Definity has been performed.  Diamond Nickel 12/09/2015, 2:29 PM

## 2015-12-09 NOTE — Progress Notes (Addendum)
PROGRESS NOTE    Marissa [name]  CZY:[id number] DOB: January 19, 1934 DOA: 12/07/2015 PCP: [name], MD    Brief Narrative:   Marissa [name] is a 82 y.o. female with lung cancer who has had a routine CT chest which showed possible left atrial thrombus was advised to come to the ER for further management. CT chest shows possible left atrial thrombus for which patient was started on IV heparin in the ER and admitted for further observation and management. Patient is not short of breath, has nonspecific chest pain in the right side.     Assessment & Plan:   Principal Problem:   Left atrial thrombus (HCC) Active Problems:   A-fib (HCC)   Hypothyroidism   COPD (chronic obstructive pulmonary disease) (HCC)   Primary cancer of right upper lobe of lung (HCC)   Type 2 diabetes mellitus ([place])   Aortic valve replaced   Mitral valve replaced  #1 left atrial thrombus Per CT of the chest done prior to admission. Patient has just returned from 2-D echo results pending. Patient on IV heparin and Coumadin has been started per cardiology. Patient may need a TEE. Cardiology following and appreciate input and recommendations.  #2 chronic atrial fibrillation Currently rate controlled on Toprol-XL. Patient has been adamant in cardiologist's office in the past about not been placed on anticoagulation. Patient has been started on Coumadin per cardiology recommendations secondary to problem #1. Per cardiology.  #3 hypothyroidism Continue Synthroid.  #4 COPD Patient with some complaints of shortness of breath. Will place patient on scheduled nebs, Pulmicort, Brovana, Mucinex. Discontinue Dulera. Solu-Medrol 60 mg IV 1.  #5 type 2 diabetes mellitus Sliding scale insulin.  #6 status post mitral valve repair/aortic valve repair Bioprosthetic valves. Continue cardiac medications. Continue aspirin. Per cardiology.  #7 coronary artery disease status post bypass and redo bypass 2013 Stable.  Patient denies any chest pain. Continue current regimen of aspirin, Lipitor, Lasix.  #8 right upper lobe lung cancer CT scan with concern for progression. Oncology has been informed via Epic of patient's admission. Will discuss with patient's primary oncologist tomorrow in terms of findings on CT chest.   DVT prophylaxis: IV heparin/Coumadin Code Status: Full Family Communication: Updated patient and daughter at bedside. Disposition Plan: Home when medically stable and per cardiology.   Consultants:   Cardiology: Dr.Skains 12/08/2015  Procedures:   2-D echo 12/09/2015  Chest x-ray 12/07/2015  Antimicrobials:   None   Subjective: Patient with complaints of shortness of breath after returning from 2-D echo. Patient denies any chest pain. No bleeding.  Objective: Vitals:   12/08/15 2112 12/08/15 2249 12/09/15 0454 12/09/15 1000  BP: 140/60  (!) 118/54 124/80  Pulse:   74 74  Resp:      Temp:   97.9 F (36.6 C)   TempSrc:   Oral   SpO2:  95% 98%   Weight:   60.1 kg (132 lb 8 oz)   Height:        Intake/Output Summary (Last 24 hours) at 12/09/15 1439 Last data filed at 12/09/15 1047  Gross per 24 hour  Intake           627.51 ml  Output             1500 ml  Net          -872.49 ml   Filed Weights   12/08/15 1100 12/09/15 0454  Weight: 60.1 kg (132 lb 6.4 oz) 60.1 kg (132 lb 8 oz)  Examination:  General exam: Appears calm and comfortable  Respiratory system: Mild expiratory wheezing. Fair air movement. Pursed breathing.  Cardiovascular system: S1 & S2 heard, RRR. No JVD, murmurs, rubs, gallops or clicks. No pedal edema. Gastrointestinal system: Abdomen is nondistended, soft and nontender. No organomegaly or masses felt. Normal bowel sounds heard. Central nervous system: Alert and oriented. No focal neurological deficits. Extremities: Symmetric 5 x 5 power. Skin: No rashes, lesions or ulcers Psychiatry: Judgement and insight appear normal. Mood & affect  appropriate.     Data Reviewed: I have personally reviewed following labs and imaging studies  CBC:  Recent Labs Lab 12/07/15 0740 12/07/15 1736 12/08/15 0503 12/09/15 0524  WBC 8.8 9.5 8.9 8.2  NEUTROABS 7.0*  --  6.8  --   HGB 10.8* 11.1* 10.1* 10.7*  HCT 34.0* 37.5 33.9* 35.5*  MCV 75.4* 80.6 79.4 79.8  PLT 209 224 207 240   Basic Metabolic Panel:  Recent Labs Lab 12/07/15 0740 12/07/15 1736 12/08/15 0503 12/09/15 0524  NA 145 140 139 138  K 3.5 3.6 3.0* 3.2*  CL  --  104 103 100*  CO2 '28 26 26 31  '$ GLUCOSE 107 198* 87 80  BUN 23.1 21* 17 15  CREATININE 0.8 0.85 0.74 0.81  CALCIUM 9.3 9.5 9.0 8.8*   GFR: Estimated Creatinine Clearance: 45.8 mL/min (by C-G formula based on SCr of 0.81 mg/dL). Liver Function Tests:  Recent Labs Lab 12/07/15 0740 12/08/15 0503  AST 19 26  ALT 19 19  ALKPHOS 127 95  BILITOT 0.62 0.6  PROT 6.9 6.3*  ALBUMIN 3.2* 3.2*   No results for input(s): LIPASE, AMYLASE in the last 168 hours. No results for input(s): AMMONIA in the last 168 hours. Coagulation Profile:  Recent Labs Lab 12/09/15 1152  INR 1.05   Cardiac Enzymes:  Recent Labs Lab 12/08/15 0503  TROPONINI <0.03   BNP (last 3 results) No results for input(s): PROBNP in the last 8760 hours. HbA1C: No results for input(s): HGBA1C in the last 72 hours. CBG:  Recent Labs Lab 12/08/15 1110 12/08/15 1701 12/08/15 2109 12/09/15 0728 12/09/15 1102  GLUCAP 103* 98 92 76 85   Lipid Profile: No results for input(s): CHOL, HDL, LDLCALC, TRIG, CHOLHDL, LDLDIRECT in the last 72 hours. Thyroid Function Tests: No results for input(s): TSH, T4TOTAL, FREET4, T3FREE, THYROIDAB in the last 72 hours. Anemia Panel: No results for input(s): VITAMINB12, FOLATE, FERRITIN, TIBC, IRON, RETICCTPCT in the last 72 hours. Sepsis Labs: No results for input(s): PROCALCITON, LATICACIDVEN in the last 168 hours.  No results found for this or any previous visit (from the past 240  hour(s)).       Radiology Studies: Dg Chest 2 View  Result Date: 12/07/2015 CLINICAL DATA:  Worsening shortness of breath.  Known lung cancer. EXAM: CHEST  2 VIEW COMPARISON:  Chest CT from the same date. FINDINGS: The heart is normal in size and stable. Stable tortuosity and calcification of the thoracic aorta. Extensive tumor, radiation fibrosis and pleural fluid involving the right hemi thorax. The left lung is relatively clear. Minimal left basilar atelectasis. Stable surgical changes from valve replacement surgery. The bony thorax is grossly intact. IMPRESSION: Extensive radiation changes, tumor and pleural effusion involving the right hemi thorax. Electronically Signed   By: Marijo Sanes M.D.   On: 12/07/2015 18:23        Scheduled Meds: . arformoterol  15 mcg Nebulization BID  . aspirin EC  81 mg Oral Daily  . atorvastatin  40 mg Oral q1800  . budesonide (PULMICORT) nebulizer solution  0.25 mg Nebulization BID  . furosemide  20 mg Oral QPM  . furosemide  40 mg Oral Daily  . glimepiride  1 mg Oral Q breakfast  . guaiFENesin  1,200 mg Oral BID  . insulin aspart  0-9 Units Subcutaneous TID WC  . ipratropium-albuterol  3 mL Nebulization Q6H  . levothyroxine  100 mcg Oral QAC breakfast  . methylPREDNISolone (SOLU-MEDROL) injection  60 mg Intravenous Once  . metoprolol succinate  50 mg Oral Daily  . multivitamin with minerals  1 tablet Oral Daily  . pantoprazole  40 mg Oral Daily  . potassium chloride SA  20 mEq Oral Daily  . rOPINIRole  4 mg Oral QHS  . vitamin C  500 mg Oral Daily  . warfarin  5 mg Oral ONCE-1800  . Warfarin - Pharmacist Dosing Inpatient   Does not apply q1800   Continuous Infusions: . heparin 1,150 Units/hr (12/09/15 0130)     LOS: 1 day    Time spent: 32 minutes    Deliliah Spranger, MD [facility] Pager [phone]  If 7PM-7AM, please contact night-coverage [email or web address] Password [facility] 12/09/2015, 2:39 PM

## 2015-12-09 NOTE — Progress Notes (Signed)
Patient Name: Marissa [name] Date of Encounter: 12/09/2015  Primary Cardiologist: [facility] Problem List     Principal Problem:   Left atrial thrombus [facility]) Active Problems:   A-fib (HCC)   Hypothyroidism   COPD (chronic obstructive pulmonary disease) ([place])   Primary cancer of right upper lobe of lung (HCC)   Type 2 diabetes mellitus ([place])   Aortic valve replaced   Mitral valve replaced     Subjective   Occasional coughing, mucous blood-tinged.  Inpatient Medications    Scheduled Meds: . aspirin EC  81 mg Oral Daily  . atorvastatin  40 mg Oral q1800  . furosemide  20 mg Oral QPM  . furosemide  40 mg Oral Daily  . glimepiride  1 mg Oral Q breakfast  . guaiFENesin  1,200 mg Oral BID  . insulin aspart  0-9 Units Subcutaneous TID WC  . levothyroxine  100 mcg Oral QAC breakfast  . metoprolol succinate  50 mg Oral Daily  . mometasone-formoterol  2 puff Inhalation BID  . multivitamin with minerals  1 tablet Oral Daily  . pantoprazole  40 mg Oral Daily  . potassium chloride SA  20 mEq Oral Daily  . potassium chloride  40 mEq Oral Once  . rOPINIRole  4 mg Oral QHS  . vitamin C  500 mg Oral Daily   Continuous Infusions: . heparin 1,150 Units/hr (12/09/15 0130)   PRN Meds:.acetaminophen **OR** acetaminophen, docusate sodium, HYDROcodone-homatropine, ipratropium-albuterol, ondansetron **OR** ondansetron (ZOFRAN) IV   Vital Signs    Vitals:   12/08/15 2050 12/08/15 2112 12/08/15 2249 12/09/15 0454  BP:  140/60  (!) 118/54  Pulse:    74  Resp:      Temp: 97.9 F (36.6 C)   97.9 F (36.6 C)  TempSrc:    Oral  SpO2:   95% 98%  Weight:    132 lb 8 oz (60.1 kg)  Height:        Intake/Output Summary (Last 24 hours) at 12/09/15 1007 Last data filed at 12/09/15 0300  Gross per 24 hour  Intake          1342.18 ml  Output             1850 ml  Net          -507.82 ml   Filed Weights   12/08/15 1100 12/09/15 0454  Weight: 132 lb 6.4 oz (60.1 kg) 132 lb 8  oz (60.1 kg)    Physical Exam    GEN: Elderly, in no acute distress.  HEENT: Grossly normal.  Neck: Supple, no JVD, carotid bruits, or masses. Cardiac: Irregularly irregular, 1/6 systolic murmur, no rubs, or gallops. No clubbing, cyanosis, edema.  Radials/DP/PT 2+ and equal bilaterally.  Respiratory:  Respirations regular and unlabored, wheeze heard bilaterally, right greater than left  GI: Soft, nontender, nondistended, BS + x 4. MS: no deformity or atrophy. Skin: warm and dry, no rash. Neuro:  Strength and sensation are intact. Psych: AAOx3.  Normal affect.  Labs    CBC  Recent Labs  12/07/15 0740  12/08/15 0503 12/09/15 0524  WBC 8.8  < > 8.9 8.2  NEUTROABS 7.0*  --  6.8  --   HGB 10.8*  < > 10.1* 10.7*  HCT 34.0*  < > 33.9* 35.5*  MCV 75.4*  < > 79.4 79.8  PLT 209  < > 207 200  < > = values in this interval not displayed. Basic Metabolic Panel  Recent  Labs  12/08/15 0503 12/09/15 0524  NA 139 138  K 3.0* 3.2*  CL 103 100*  CO2 26 31  GLUCOSE 87 80  BUN 17 15  CREATININE 0.74 0.81  CALCIUM 9.0 8.8*   Liver Function Tests  Recent Labs  12/07/15 0740 12/08/15 0503  AST 19 26  ALT 19 19  ALKPHOS 127 95  BILITOT 0.62 0.6  PROT 6.9 6.3*  ALBUMIN 3.2* 3.2*   No results for input(s): LIPASE, AMYLASE in the last 72 hours. Cardiac Enzymes  Recent Labs  12/08/15 0503  TROPONINI <0.03   BNP Invalid input(s): POCBNP D-Dimer No results for input(s): DDIMER in the last 72 hours. Hemoglobin A1C No results for input(s): HGBA1C in the last 72 hours. Fasting Lipid Panel No results for input(s): CHOL, HDL, LDLCALC, TRIG, CHOLHDL, LDLDIRECT in the last 72 hours. Thyroid Function Tests No results for input(s): TSH, T4TOTAL, T3FREE, THYROIDAB in the last 72 hours.  Invalid input(s): FREET3  Telemetry    Atrial fibrillation rate controlled - Personally Reviewed  ECG    Atrial fibrillation no ST segment changes - Personally Reviewed  Radiology      Dg Chest 2 View  Result Date: 12/07/2015 CLINICAL DATA:  Worsening shortness of breath.  Known lung cancer. EXAM: CHEST  2 VIEW COMPARISON:  Chest CT from the same date. FINDINGS: The heart is normal in size and stable. Stable tortuosity and calcification of the thoracic aorta. Extensive tumor, radiation fibrosis and pleural fluid involving the right hemi thorax. The left lung is relatively clear. Minimal left basilar atelectasis. Stable surgical changes from valve replacement surgery. The bony thorax is grossly intact. IMPRESSION: Extensive radiation changes, tumor and pleural effusion involving the right hemi thorax. Electronically Signed   By: Marijo Sanes M.D.   On: 12/07/2015 18:23     Cardiac Studies   CT scan demonstrating left atrial mass-likely thrombus in the setting of extensive right upper lung cancer  Patient Profile     82 year old female with atrial fibrillation, left atrial mass newly discovered on surveillance CT scan consistent with LA thrombus with right upper lobe progression of cancer, coronary disease status post CABG with redo bypass in 2013, aortic valve and mitral valve replacement as well with COPD.  Assessment & Plan    Left atrial mass/thrombus  - Likely thrombus  - Heparin IV  - We'll consult pharmacy for Coumadin. She is still concerned about possible left inner ear bleeding as she had previously on Coumadin however I strongly weighed the risks versus benefits with her and she is willing to try Coumadin again. Given her mitral and aortic valve replacements, she would not be a candidate for Eliquis as she was asking about.  Chronic atrial fibrillation  - Currently on IV heparin, starting Coumadin, rate controlled  - In clinic setting she had been adamant about not taking anticoagulation in the past.  Right upper lung cancer  - Progression noted on CT scan  - Per primary team/oncology  - Need to weigh risks versus benefit of anticoagulation  - Blood-tinged  sputum noted this morning with cough. CBC stable. If gross hemoptysis occurs, anticoagulation will need to be stopped.  COPD  - Per primary team.  Aortic valve and mitral valve replacement  - Bioprosthetic valves.  - On aspirin as well.  CAD status post bypass and redo bypass in 2013.  - No anginal symptoms.   Signed, Candee Furbish, MD  12/09/2015, 10:07 AM

## 2015-12-09 NOTE — Progress Notes (Signed)
[place] for Heparin Indication: Left atrial thrombus  Allergies  Allergen Reactions  . Coumadin [Warfarin Sodium] Other (See Comments)    Reaction: Bleeding from ears  . Prozac [Fluoxetine Hcl] Rash    Patient Measurements: Height: '5\' 3"'$  (160 cm) Weight: 132 lb 6.4 oz (60.1 kg) IBW/kg (Calculated) : 52.4 Heparin Dosing Weight: 59 kg  Vital Signs: Temp: 97.9 F (36.6 C) (09/19 2050) Temp Source: Axillary (09/19 1900) BP: 140/60 (09/19 2112) Pulse Rate: 78 (09/19 1900)  Labs:  Recent Labs  12/07/15 0740 12/07/15 1736 12/08/15 0011 12/08/15 0503 12/08/15 1204  HGB 10.8* 11.1*  --  10.1*  --   HCT 34.0* 37.5  --  33.9*  --   PLT 209 224  --  207  --   HEPARINUNFRC  --   --  0.19*  --  <0.10*  CREATININE 0.8 0.85  --  0.74  --   TROPONINI  --   --   --  <0.03  --     Estimated Creatinine Clearance: 46.4 mL/min (by C-G formula based on SCr of 0.74 mg/dL).  Assessment: 82 y.o. female with LA thrombus for heparin.   Heparin level tonight 0.19 (reported as 9/19 at 0011)  Goal of Therapy:  Heparin level 0.3-0.7 units/ml Monitor platelets by anticoagulation protocol: Yes   Plan:  Increase Heparin 1150 units/hr Check heparin level in 8 hours.   Aviv Lengacher, [place]urb 12/09/2015,1:20 AM

## 2015-12-09 NOTE — Progress Notes (Addendum)
[place] for Heparin Indication: Left atrial thrombus  Allergies  Allergen Reactions  . Coumadin [Warfarin Sodium] Other (See Comments)    Reaction: Bleeding from ears  . Prozac [Fluoxetine Hcl] Rash    Patient Measurements: Height: '5\' 3"'$  (160 cm) Weight: 132 lb 8 oz (60.1 kg) IBW/kg (Calculated) : 52.4 Heparin Dosing Weight: 59 kg  Vital Signs: Temp: 97.9 F (36.6 C) (09/20 0454) Temp Source: Oral (09/20 0454) BP: 118/54 (09/20 0454) Pulse Rate: 74 (09/20 0454)  Labs:  Recent Labs  12/07/15 1736 12/08/15 0011 12/08/15 0503 12/08/15 1204 12/09/15 0524  HGB 11.1*  --  10.1*  --  10.7*  HCT 37.5  --  33.9*  --  35.5*  PLT 224  --  207  --  200  HEPARINUNFRC  --  0.19*  --  <0.10*  --   CREATININE 0.85  --  0.74  --  0.81  TROPONINI  --   --  <0.03  --   --     Estimated Creatinine Clearance: 45.8 mL/min (by C-G formula based on SCr of 0.81 mg/dL).  Assessment: 82 y.o. female with LA thrombus for heparin.   Heparin level is undetectable after increase to 1150 units/hr. Prior heparin level was 0.19 (possible lab error?)  Goal of Therapy:  Heparin level 0.3-0.7 units/ml Monitor platelets by anticoagulation protocol: Yes   Plan:  Increase Heparin 1300 units/hr Check heparin level in 8 hours.   Hildred Laser, Pharm D 12/09/2015 11:47 AM   Addendum -Pharmacy to dose coumadin -INR= 1.05  Plan -Coumadin '5mg'$  po x1 -Daily PT/INR  Hildred Laser, Pharm D 12/09/2015 1:44 PM

## 2015-12-09 NOTE — Care Management Note (Addendum)
Case Management Note  Patient Details  Name: Marissa [name] MRN: [id number] Date of Birth: 1934/02/27  Subjective/Objective: Pt presented for Left atrial mass/thrombus.                     Action/Plan: CM received referral for Eliquis. Benefits check in process and will make patient aware once completed. CM will continue to monitor for disposition needs.   Expected Discharge Date:                  Expected Discharge Plan:  [place]  In-House Referral:     Discharge planning Services  CM Consult  Post Acute Care Choice:   Home With [place] Choice offered to:   Patient, Adult Children  DME Arranged:   N/A DME Agency:   N/A  HH Arranged:   Registered Nurse [place] Agency:   [place] If discussed at [place] of Stay Meetings, dates discussed:    Additional Comments: [place], RN,BSN [phone] CM did speak with pt in regards to disposition needs and she is agreeable to [place] List provided and pt uses [facility] for DME needs. CM did make referral to [facility] and [place] to begin within 24-48 hours post d/c. Daughter at bedside to provide transportation home. CM trying to call [facility] to reschedule Coumadin clinic appointment. CM did reschedule the appointment to [facility]. No further needs from CM at this time.    1349 12-10-15 Jacqlyn Krauss, RN,BSN [phone] CM did call [facility] and coumadin Clinic appointment scheduled for 12-16-15 @ 2:30. Placed on AVS.    1326 12-10-15 Jacqlyn Krauss, RN,BSN [phone] CM did receive referral in regards f/u for coumadin appointment. CM did speak with pt in regards to cardiologist and she sees [facility]. CM will make follow up appointment and place on AVS. Pt has DME 02 at home. Daughter to bring portable tank once ready for d/c. No further needs from CM @ this time.    12-09-15 Jacqlyn Krauss, RN,BSN [phone] S/W  [facility] @ [facility] # [phone]   ELIQUIS  2.5 MG  BID ( 30 )   AND 5 MG BID   COVER- YES                      YES  CO-PAY- $ 24.00                   SAME  PRIOR APPROVAL - NO             SAME  PHARMACY : ANY RETAIL  MAIL-ORDER FOR 90 DAY SUPPLY $ 20.00  Bethena Roys, RN 12/09/2015, 11:17 AM

## 2015-12-09 NOTE — Progress Notes (Signed)
[place] for Heparin Indication: Left atrial thrombus  Allergies  Allergen Reactions  . Coumadin [Warfarin Sodium] Other (See Comments)    Reaction: Bleeding from ears  . Prozac [Fluoxetine Hcl] Rash    Patient Measurements: Height: '5\' 3"'$  (160 cm) Weight: 132 lb 8 oz (60.1 kg) IBW/kg (Calculated) : 52.4 Heparin Dosing Weight: 59 kg  Vital Signs: Temp: 97.5 F (36.4 C) (09/20 2033) Temp Source: Axillary (09/20 2033) BP: 126/77 (09/20 2033) Pulse Rate: 77 (09/20 2033)  Labs:  Recent Labs  12/07/15 1736  12/08/15 0503 12/08/15 1204 12/09/15 0524 12/09/15 1152 12/09/15 2048  HGB 11.1*  --  10.1*  --  10.7*  --   --   HCT 37.5  --  33.9*  --  35.5*  --   --   PLT 224  --  207  --  200  --   --   LABPROT  --   --   --   --   --  13.7  --   INR  --   --   --   --   --  1.05  --   HEPARINUNFRC  --   < >  --  <0.10*  --  0.43 0.77*  CREATININE 0.85  --  0.74  --  0.81  --   --   TROPONINI  --   --  <0.03  --   --   --   --   < > = values in this interval not displayed.  Estimated Creatinine Clearance: 45.8 mL/min (by C-G formula based on SCr of 0.81 mg/dL).  Assessment: 82 y.o. female with LA thrombus for heparin.   Heparin level is slightly supra-therapeutic at 0.77 on 1300 units/hr. Warfarin dose given.  CBC was stable.   Goal of Therapy:  Heparin level 0.3-0.7 units/ml Monitor platelets by anticoagulation protocol: Yes   Plan:  Decrease Heparin slightly to 1250 units/hr  Daily heparin level and CBC.   Sloan Leiter, PharmD, BCPS Clinical Pharmacist [phone] 12/09/2015, 9:38 PM

## 2015-12-10 LAB — CBC
HEMATOCRIT: 37.4 % (ref 36.0–46.0)
Hemoglobin: 11.3 g/dL — ABNORMAL LOW (ref 12.0–15.0)
MCH: 23.9 pg — ABNORMAL LOW (ref 26.0–34.0)
MCHC: 30.2 g/dL (ref 30.0–36.0)
MCV: 79.2 fL (ref 78.0–100.0)
PLATELETS: 223 10*3/uL (ref 150–400)
RBC: 4.72 MIL/uL (ref 3.87–5.11)
RDW: 16.6 % — AB (ref 11.5–15.5)
WBC: 11 10*3/uL — AB (ref 4.0–10.5)

## 2015-12-10 LAB — GLUCOSE, CAPILLARY
GLUCOSE-CAPILLARY: 105 mg/dL — AB (ref 65–99)
GLUCOSE-CAPILLARY: 104 mg/dL — AB (ref 65–99)
Glucose-Capillary: 122 mg/dL — ABNORMAL HIGH (ref 65–99)
Glucose-Capillary: 108 mg/dL — ABNORMAL HIGH (ref 65–99)

## 2015-12-10 LAB — BASIC METABOLIC PANEL
ANION GAP: 12 (ref 5–15)
BUN: 18 mg/dL (ref 6–20)
CO2: 25 mmol/L (ref 22–32)
Calcium: 8.8 mg/dL — ABNORMAL LOW (ref 8.9–10.3)
Chloride: 101 mmol/L (ref 101–111)
Creatinine, Ser: 0.79 mg/dL (ref 0.44–1.00)
GFR calc Af Amer: >60 mL/min (ref >60)
GFR calc non Af Amer: >60 mL/min (ref >60)
GLUCOSE: 168 mg/dL — AB (ref 65–99)
POTASSIUM: 4 mmol/L (ref 3.5–5.1)
Sodium: 138 mmol/L (ref 135–145)

## 2015-12-10 LAB — PROTIME-INR
INR: 1.06
Prothrombin Time: 13.8 seconds (ref 11.4–15.2)

## 2015-12-10 LAB — HEPARIN LEVEL (UNFRACTIONATED): Heparin Unfractionated: 0.32 IU/mL (ref 0.30–0.70)

## 2015-12-10 MED ORDER — HEPARIN (PORCINE) IN NACL 100-0.45 UNIT/ML-% IJ SOLN
1100.0000 [IU]/h | INTRAMUSCULAR | Status: DC
  Administered 2015-12-10: 1200 [IU]/h via INTRAVENOUS
  Administered 2015-12-11: 1100 [IU]/h via INTRAVENOUS
  Filled 2015-12-10 (×2): qty 250

## 2015-12-10 MED ORDER — WARFARIN SODIUM 5 MG PO TABS
5.0000 mg | ORAL_TABLET | Freq: Once | ORAL | Status: AC
  Administered 2015-12-10: 5 mg via ORAL
  Filled 2015-12-10: qty 1

## 2015-12-10 NOTE — Progress Notes (Signed)
Heparin gtt remains on hold. Patient continues to nasal bleeding. Dr. Christell Constant updated and order given to DC Heparin gtt at this moment.

## 2015-12-10 NOTE — Progress Notes (Signed)
Patient Name: Marissa [name] Date of Encounter: 12/10/2015  Principal Problem:   Left atrial thrombus (HCC) Active Problems:   A-fib (HCC)   Hypothyroidism   COPD (chronic obstructive pulmonary disease) ([place])   Primary cancer of right upper lobe of lung (HCC)   Type 2 diabetes mellitus ([place])   Aortic valve replaced   Mitral valve replaced   Primary Cardiologist: Dr Marlou Porch Patient Profile: 82 yo female w/ hx CABG x 2, COPD on home O2, AVR 2013 with 21 mm porcine valve, history of rheumatic fever with mitral valve replacement in 2013 with 27 mm porcine valve, Afib, PAH, CHF, stage IIIA NSC lung CA, squamous cell CA had recurrence in the RUL and additional mediastinal lymphadenopathy diagnosed in September 2016 s/pchemoradiation with carboplatin and paclitaxel. Admitted 09/19 w/ new LA mass seen on CT  SUBJECTIVE: Had a nosebleed and was coughing up some blood (?drainage) today also. No chest pain or SOB. Not currently on O2  OBJECTIVE Vitals:   12/09/15 1457 12/09/15 2026 12/09/15 2033 12/10/15 0518  BP: 120/72  126/77 (!) 159/73  Pulse: 77 78 77 94  Resp: '19 18 18 20  '$ Temp: 97.8 F (36.6 C)  97.5 F (36.4 C) 97.8 F (36.6 C)  TempSrc: Oral  Axillary Oral  SpO2: 99% 98% 100% 92%  Weight:    129 lb 6.4 oz (58.7 kg)  Height:        Intake/Output Summary (Last 24 hours) at 12/10/15 0859 Last data filed at 12/09/15 1852  Gross per 24 hour  Intake              720 ml  Output              500 ml  Net              220 ml   Filed Weights   12/08/15 1100 12/09/15 0454 12/10/15 0518  Weight: 132 lb 6.4 oz (60.1 kg) 132 lb 8 oz (60.1 kg) 129 lb 6.4 oz (58.7 kg)    PHYSICAL EXAM General: Well developed, well nourished, female in no acute distress. Head: Normocephalic, atraumatic.  Neck: Supple without bruits, JVD not elevated. Lungs:  Resp regular and unlabored, some fine dry rales. Heart: Irreg R&R, S1, S2, no S3, S4, 2/6 murmurs; no rub. Abdomen: Soft, non-tender,  non-distended, BS + x 4.  Extremities: No clubbing, cyanosis, edema.  Neuro: Alert and oriented X 3. Moves all extremities spontaneously. Psych: Normal affect.  LABS: CBC: Recent Labs  12/08/15 0503 12/09/15 0524 12/10/15 0416  WBC 8.9 8.2 11.0*  NEUTROABS 6.8  --   --   HGB 10.1* 10.7* 11.3*  HCT 33.9* 35.5* 37.4  MCV 79.4 79.8 79.2  PLT 207 200 223   INR: Recent Labs  12/10/15 0416  INR 6.30   Basic Metabolic Panel: Recent Labs  12/09/15 0524 12/10/15 0416  NA 138 138  K 3.2* 4.0  CL 100* 101  CO2 31 25  GLUCOSE 80 168*  BUN 15 18  CREATININE 0.81 0.79  CALCIUM 8.8* 8.8*   Liver Function Tests: Recent Labs  12/08/15 0503  AST 26  ALT 19  ALKPHOS 95  BILITOT 0.6  PROT 6.3*  ALBUMIN 3.2*   Cardiac Enzymes: Recent Labs  12/08/15 0503  TROPONINI <0.03    Recent Labs  12/07/15 1744  TROPIPOC 0.01   TELE:    Atrial fib, rate generally < 100   Current Medications:  . arformoterol  15  mcg Nebulization BID  . aspirin EC  81 mg Oral Daily  . atorvastatin  40 mg Oral q1800  . budesonide (PULMICORT) nebulizer solution  0.25 mg Nebulization BID  . furosemide  20 mg Oral QPM  . furosemide  40 mg Oral Daily  . glimepiride  1 mg Oral Q breakfast  . guaiFENesin  1,200 mg Oral BID  . insulin aspart  0-9 Units Subcutaneous TID WC  . ipratropium-albuterol  3 mL Nebulization BID  . levothyroxine  100 mcg Oral QAC breakfast  . metoprolol succinate  50 mg Oral Daily  . multivitamin with minerals  1 tablet Oral Daily  . pantoprazole  40 mg Oral Daily  . potassium chloride SA  20 mEq Oral Daily  . rOPINIRole  4 mg Oral QHS  . vitamin C  500 mg Oral Daily  . Warfarin - Pharmacist Dosing Inpatient   Does not apply q1800      ASSESSMENT AND PLAN: Left atrial mass/thrombus  - Likely thrombus  - Heparin IV  - We'll consult pharmacy for Coumadin. She is still concerned about possible left inner ear bleeding as she had previously on Coumadin however I  strongly weighed the risks versus benefits with her and she is willing to try Coumadin again. Given her mitral and aortic valve replacements, she would not be a candidate for Eliquis as she was asking about. - heparin currently on hold due to bleeding, heparin level therapeutic at 0.32 at 4 am.  Chronic atrial fibrillation  -  IV heparin on hold this am, starting Coumadin, rate controlled  - In clinic setting she had been adamant about not taking anticoagulation in the past.  Right upper lung cancer  - Progression noted on CT scan  - Per primary team/oncology  - Need to weigh risks versus benefit of anticoagulation  - Blood-tinged sputum noted this morning with cough. CBC stable. If gross hemoptysis occurs, anticoagulation will need to be stopped.  COPD  - Per primary team.  Aortic valve and mitral valve replacement  - Bioprosthetic valves.  - On aspirin as well.  CAD status post bypass and redo bypass in 2013.  - No anginal symptoms.  Otherwise, per IM Principal Problem:   Left atrial thrombus (HCC) Active Problems:   A-fib (HCC)   Hypothyroidism   COPD (chronic obstructive pulmonary disease) (HCC)   Primary cancer of right upper lobe of lung (HCC)   Type 2 diabetes mellitus ([place])   Aortic valve replaced   Mitral valve replaced   Signed, Barrett, Rhonda , PA-C 8:59 AM 12/10/2015  Personally seen and examined. Agree with above.  LA thrombus on CT scan clearly seen. No need for TEE Murmur noted Coumadin (AFIB/Thrombus/AVR and MVR) Epistaxis. Per primary team.  Progressive lung CA No further recs. Will sign off. Will be happy to monitor coumadin in our clinic if she would like.   Candee Furbish, MD

## 2015-12-10 NOTE — Progress Notes (Signed)
Overnight Cardiology Fellow Note  Called about patient with lung cancer and possible LAA thrombus  who developed a nose bleed and some minimal hemoptysis on heparin around midnight. At lowest dose, still with trace hemoptysis and nose bleed so have held heparin gtt at this time. Would consider restarting in daytime after discussion with patient, radiology. May consider TEE to better evaluate LAA.  Charlies Silvers, MD

## 2015-12-10 NOTE — Progress Notes (Signed)
Patient with nasal bleeding while on heparin gtt. Dr. Christell Constant notified. Order given to hold IV heparin for 3 hours. Will cont. To monitor closely.

## 2015-12-10 NOTE — Progress Notes (Signed)
[place] for Heparin Indication: Left atrial thrombus  Allergies  Allergen Reactions  . Coumadin [Warfarin Sodium] Other (See Comments)    Reaction: Bleeding from ears  . Prozac [Fluoxetine Hcl] Rash    Patient Measurements: Height: '5\' 3"'$  (160 cm) Weight: 129 lb 6.4 oz (58.7 kg) IBW/kg (Calculated) : 52.4 Heparin Dosing Weight: 59 kg  Vital Signs: Temp: 97.8 F (36.6 C) (09/21 0518) Temp Source: Oral (09/21 0518) BP: 142/83 (09/21 0900) Pulse Rate: 94 (09/21 0518)  Labs:  Recent Labs  12/08/15 0503  12/09/15 0524 12/09/15 1152 12/09/15 2048 12/10/15 0416  HGB 10.1*  --  10.7*  --   --  11.3*  HCT 33.9*  --  35.5*  --   --  37.4  PLT 207  --  200  --   --  223  LABPROT  --   --   --  13.7  --  13.8  INR  --   --   --  1.05  --  1.06  HEPARINUNFRC  --   < >  --  0.43 0.77* 0.32  CREATININE 0.74  --  0.81  --   --  0.79  TROPONINI <0.03  --   --   --   --   --   < > = values in this interval not displayed.  Estimated Creatinine Clearance: 46.4 mL/min (by C-G formula based on SCr of 0.79 mg/dL).  Assessment: 82 y.o. female with LA thrombus (also noted with bioprosthetic AVR/MVR) on heparin and coumadin added 9/20. On 9/20  she was noted with nose bleed w/ and minimal hemoptysis and heparin held (heparin level was above goal last pm).  Spoke with PA and plans to continue coumadin and hold heparin.  -INR= 1.06   Goal of Therapy:  INR= 2-3 Heparin level 0.3-0.7 units/ml Monitor platelets by anticoagulation protocol: Yes   Plan:  -Hold heparin -Coumadin '5mg'$  po today -Daily INR  Hildred Laser, Pharm D 12/10/2015 9:57 AM

## 2015-12-10 NOTE — Progress Notes (Signed)
[place] for Heparin Indication: Left atrial thrombus  Allergies  Allergen Reactions  . Coumadin [Warfarin Sodium] Other (See Comments)    Reaction: Bleeding from ears  . Prozac [Fluoxetine Hcl] Rash    Patient Measurements: Height: '5\' 3"'$  (160 cm) Weight: 129 lb 6.4 oz (58.7 kg) IBW/kg (Calculated) : 52.4 Heparin Dosing Weight: 59 kg  Vital Signs: Temp: 98.1 F (36.7 C) (09/21 1604) Temp Source: Oral (09/21 1604) BP: 136/60 (09/21 1604) Pulse Rate: 80 (09/21 1604)  Labs:  Recent Labs  12/08/15 0503  12/09/15 0524 12/09/15 1152 12/09/15 2048 12/10/15 0416  HGB 10.1*  --  10.7*  --   --  11.3*  HCT 33.9*  --  35.5*  --   --  37.4  PLT 207  --  200  --   --  223  LABPROT  --   --   --  13.7  --  13.8  INR  --   --   --  1.05  --  1.06  HEPARINUNFRC  --   < >  --  0.43 0.77* 0.32  CREATININE 0.74  --  0.81  --   --  0.79  TROPONINI <0.03  --   --   --   --   --   < > = values in this interval not displayed.  Estimated Creatinine Clearance: 46.4 mL/min (by C-G formula based on SCr of 0.79 mg/dL).  Assessment: 82 y.o. female with LA thrombus (also noted with bioprosthetic AVR/MVR) on heparin and coumadin added 9/20. On 9/20  she was noted with nose bleed w/ and minimal hemoptysis and heparin held.  Per Dr. Grandville Silos to resume heparin with no bolus INR 1.06 today.   Goal of Therapy:  INR= 2-3 Heparin level 0.3-0.7 units/ml Monitor platelets by anticoagulation protocol: Yes   Plan:  - Restart heparin infusion 1200 units/hr - f/u AM heparin level  Maryanna Shape, PharmD, BCPS  Clinical Pharmacist  Pager: [phone]   12/10/2015 8:19 PM

## 2015-12-10 NOTE — Discharge Instructions (Addendum)

## 2015-12-10 NOTE — Progress Notes (Signed)
PROGRESS NOTE    Marissa [name]  YBO:[id number] DOB: 03-16-1934 DOA: 12/07/2015 PCP: [name], MD    Brief Narrative:   Marissa [name] is a 82 y.o. female with lung cancer who has had a routine CT chest which showed possible left atrial thrombus was advised to come to the ER for further management. CT chest shows possible left atrial thrombus for which patient was started on IV heparin in the ER and admitted for further observation and management. Patient is not short of breath, has nonspecific chest pain in the right side.     Assessment & Plan:   Principal Problem:   Left atrial thrombus (HCC) Active Problems:   A-fib (HCC)   Hypothyroidism   COPD (chronic obstructive pulmonary disease) (HCC)   Primary cancer of right upper lobe of lung (HCC)   Type 2 diabetes mellitus ([place])   Aortic valve replaced   Mitral valve replaced  #1 left atrial thrombus Per CT of the chest done prior to admission. Patient status post 2-D echo with EF of 60-65% with no wall motion abnormalities. Bioprosthesis noted in the aortic and mitral valve. Left atrium was moderately to severely dilated. No thrombus was seen in the left atrium. Per cardiology left atrial thrombus on CT scan can clearly be seen and feel no need for a TEE at this time. Due to patient's epistaxis and streaks of blood in sputum IV heparin has been discontinued. Continue Coumadin per cardiology recommendations. Patient may follow-up in the Coumadin clinic with cardiology office.   #2 chronic atrial fibrillation Currently rate controlled on Toprol-XL. Patient has been adamant in cardiologist's office in the past about not been placed on anticoagulation. Patient has been started on Coumadin per cardiology recommendations secondary to problem #1. Per cardiology.  #3 hypothyroidism Continue Synthroid.  #4 COPD Patient with improvement with shortness of breath after being placed on scheduled nebulizers, Pulmicort, Mucinex.  Patient is status post 1 dose IV Solu-Medrol. Follow.   #5 type 2 diabetes mellitus Sliding scale insulin.  #6 status post mitral valve repair/aortic valve repair Bioprosthetic valves. Continue cardiac medications. Continue aspirin. Per cardiology.  #7 coronary artery disease status post bypass and redo bypass 2013 Stable. Patient denies any chest pain. Continue current regimen of aspirin, Lipitor, Lasix.  #8 right upper lobe lung cancer CT scan with concern for progression. Oncology has been informed via Epic of patient's admission. Tried calling patient's primary oncologist however got a voicemail. Patient already has an appointment for October 2 with her oncologist. Outpatient follow-up.  #9 epistaxis/streaks of blood in sputum Patient does have history of lung cancer and per CT chest there was some concern for progressive lung cancer. IV heparin has been discontinued. Bleeding seems to be in proving/subsiding. Continue Coumadin secondary to left atrial thrombus per cardiology recommendations. Follow closely.   DVT prophylaxis: SCDs/Coumadin Code Status: Full Family Communication: Updated patient and daughter at bedside. Disposition Plan: Home when medically stable and improvement with nasal bleeding and no further hemoptysis and per cardiology.   Consultants:   Cardiology: Dr.Skains 12/08/2015  Procedures:   2-D echo 12/09/2015  Chest x-ray 12/07/2015  Antimicrobials:   None   Subjective: Patient states improvement with shortness of breath.  Patient denies any chest pain. Patient noted to have some epistaxis earlier on which has since improved. Patient with some streaks of blood in sputum per nursing overnight. IV heparin has been discontinued.   Objective: Vitals:   12/10/15 1010 12/10/15 1011 12/10/15 1012 12/10/15 1055  BP:    (!) 164/111  Pulse:    (!) 102  Resp:    20  Temp:    98.4 F (36.9 C)  TempSrc:    Oral  SpO2: 91% 91% 93% 92%  Weight:        Height:        Intake/Output Summary (Last 24 hours) at 12/10/15 1145 Last data filed at 12/10/15 0900  Gross per 24 hour  Intake              720 ml  Output                0 ml  Net              720 ml   Filed Weights   12/08/15 1100 12/09/15 0454 12/10/15 0518  Weight: 60.1 kg (132 lb 6.4 oz) 60.1 kg (132 lb 8 oz) 58.7 kg (129 lb 6.4 oz)    Examination:  General exam: Appears calm and comfortable  Respiratory system: Fair air movement. No wheezing. Pursed breathing.  Cardiovascular system: S1 & S2 heard, Irregularly irregular. 2/6 SEM. No JVD, rubs, gallops or clicks. No pedal edema. Gastrointestinal system: Abdomen is nondistended, soft and nontender. No organomegaly or masses felt. Normal bowel sounds heard. Central nervous system: Alert and oriented. No focal neurological deficits. Extremities: Symmetric 5 x 5 power. Skin: No rashes, lesions or ulcers Psychiatry: Judgement and insight appear normal. Mood & affect appropriate.     Data Reviewed: I have personally reviewed following labs and imaging studies  CBC:  Recent Labs Lab 12/07/15 0740 12/07/15 1736 12/08/15 0503 12/09/15 0524 12/10/15 0416  WBC 8.8 9.5 8.9 8.2 11.0*  NEUTROABS 7.0*  --  6.8  --   --   HGB 10.8* 11.1* 10.1* 10.7* 11.3*  HCT 34.0* 37.5 33.9* 35.5* 37.4  MCV 75.4* 80.6 79.4 79.8 79.2  PLT 209 224 207 200 034   Basic Metabolic Panel:  Recent Labs Lab 12/07/15 0740 12/07/15 1736 12/08/15 0503 12/09/15 0524 12/10/15 0416  NA 145 140 139 138 138  K 3.5 3.6 3.0* 3.2* 4.0  CL  --  104 103 100* 101  CO2 '28 26 26 31 25  '$ GLUCOSE 107 198* 87 80 168*  BUN 23.1 21* '17 15 18  '$ CREATININE 0.8 0.85 0.74 0.81 0.79  CALCIUM 9.3 9.5 9.0 8.8* 8.8*   GFR: Estimated Creatinine Clearance: 46.4 mL/min (by C-G formula based on SCr of 0.79 mg/dL). Liver Function Tests:  Recent Labs Lab 12/07/15 0740 12/08/15 0503  AST 19 26  ALT 19 19  ALKPHOS 127 95  BILITOT 0.62 0.6  PROT 6.9 6.3*   ALBUMIN 3.2* 3.2*   No results for input(s): LIPASE, AMYLASE in the last 168 hours. No results for input(s): AMMONIA in the last 168 hours. Coagulation Profile:  Recent Labs Lab 12/09/15 1152 12/10/15 0416  INR 1.05 1.06   Cardiac Enzymes:  Recent Labs Lab 12/08/15 0503  TROPONINI <0.03   BNP (last 3 results) No results for input(s): PROBNP in the last 8760 hours. HbA1C: No results for input(s): HGBA1C in the last 72 hours. CBG:  Recent Labs Lab 12/09/15 1102 12/09/15 1614 12/09/15 2032 12/10/15 0733 12/10/15 1119  GLUCAP 85 99 235* 122* 108*   Lipid Profile: No results for input(s): CHOL, HDL, LDLCALC, TRIG, CHOLHDL, LDLDIRECT in the last 72 hours. Thyroid Function Tests: No results for input(s): TSH, T4TOTAL, FREET4, T3FREE, THYROIDAB in the last 72 hours. Anemia Panel: No results for  input(s): VITAMINB12, FOLATE, FERRITIN, TIBC, IRON, RETICCTPCT in the last 72 hours. Sepsis Labs: No results for input(s): PROCALCITON, LATICACIDVEN in the last 168 hours.  No results found for this or any previous visit (from the past 240 hour(s)).       Radiology Studies: No results found.      Scheduled Meds: . arformoterol  15 mcg Nebulization BID  . aspirin EC  81 mg Oral Daily  . atorvastatin  40 mg Oral q1800  . budesonide (PULMICORT) nebulizer solution  0.25 mg Nebulization BID  . furosemide  20 mg Oral QPM  . furosemide  40 mg Oral Daily  . glimepiride  1 mg Oral Q breakfast  . guaiFENesin  1,200 mg Oral BID  . insulin aspart  0-9 Units Subcutaneous TID WC  . ipratropium-albuterol  3 mL Nebulization BID  . levothyroxine  100 mcg Oral QAC breakfast  . metoprolol succinate  50 mg Oral Daily  . multivitamin with minerals  1 tablet Oral Daily  . pantoprazole  40 mg Oral Daily  . potassium chloride SA  20 mEq Oral Daily  . rOPINIRole  4 mg Oral QHS  . vitamin C  500 mg Oral Daily  . Warfarin - Pharmacist Dosing Inpatient   Does not apply q1800    Continuous Infusions:     LOS: 2 days    Time spent: 34 minutes    THOMPSON,DANIEL, MD [facility] Pager [phone]  If 7PM-7AM, please contact night-coverage [email or web address] Password [email or web address] 12/10/2015, 11:45 AM

## 2015-12-11 DIAGNOSIS — R040 Epistaxis: Secondary | ICD-10-CM

## 2015-12-11 LAB — PROTIME-INR
INR: 1.63
PROTHROMBIN TIME: 19.5 s — AB (ref 11.4–15.2)

## 2015-12-11 LAB — CBC
HEMATOCRIT: 33.5 % — AB (ref 36.0–46.0)
HEMOGLOBIN: 9.7 g/dL — AB (ref 12.0–15.0)
MCH: 23.3 pg — AB (ref 26.0–34.0)
MCHC: 29 g/dL — AB (ref 30.0–36.0)
MCV: 80.5 fL (ref 78.0–100.0)
Platelets: 207 10*3/uL (ref 150–400)
RBC: 4.16 MIL/uL (ref 3.87–5.11)
RDW: 17.1 % — ABNORMAL HIGH (ref 11.5–15.5)
WBC: 10.5 10*3/uL (ref 4.0–10.5)

## 2015-12-11 LAB — HEPARIN LEVEL (UNFRACTIONATED): Heparin Unfractionated: 0.79 IU/mL — ABNORMAL HIGH (ref 0.30–0.70)

## 2015-12-11 LAB — GLUCOSE, CAPILLARY
GLUCOSE-CAPILLARY: 76 mg/dL (ref 65–99)
GLUCOSE-CAPILLARY: 75 mg/dL (ref 65–99)
Glucose-Capillary: 86 mg/dL (ref 65–99)
Glucose-Capillary: 143 mg/dL — ABNORMAL HIGH (ref 65–99)

## 2015-12-11 MED ORDER — WARFARIN SODIUM 2.5 MG PO TABS
2.5000 mg | ORAL_TABLET | Freq: Once | ORAL | Status: AC
  Administered 2015-12-11: 2.5 mg via ORAL
  Filled 2015-12-11: qty 1

## 2015-12-11 NOTE — Care Management Important Message (Signed)
Important Message  Patient Details  Name: Marissa [name] MRN: [id number] Date of Birth: 1933/06/16   Medicare Important Message Given:  Yes    [name] 12/11/2015, 1:30 PM

## 2015-12-11 NOTE — Progress Notes (Signed)
PROGRESS NOTE    Marissa [name]  AOZ:[id number] DOB: [birth year] DOA: 12/07/2015 PCP: [name], MD    Brief Narrative:   Marissa [name] is a 82 y.o. female with lung cancer who has had a routine CT chest which showed possible left atrial thrombus was advised to come to the ER for further management. CT chest shows possible left atrial thrombus for which patient was started on IV heparin in the ER and admitted for further observation and management. Patient is not short of breath, has nonspecific chest pain in the right side.     Assessment & Plan:   Principal Problem:   Left atrial thrombus (HCC) Active Problems:   A-fib (HCC)   Hypothyroidism   COPD (chronic obstructive pulmonary disease) (HCC)   Primary cancer of right upper lobe of lung (HCC)   Type 2 diabetes mellitus ([place])   Aortic valve replaced   Mitral valve replaced  #1 left atrial thrombus Per CT of the chest done prior to admission. Patient status post 2-D echo with EF of 60-65% with no wall motion abnormalities. Bioprosthesis noted in the aortic and mitral valve. Left atrium was moderately to severely dilated. No thrombus was seen in the left atrium. Per cardiology left atrial thrombus on CT scan can clearly be seen and feel no need for a TEE at this time. Due to patient's epistaxis and streaks of blood in sputum IV heparin was discontinued yesterday morning and resumed yesterday evening with no further significant bleeding. Continue Coumadin per cardiology recommendations. Patient may follow-up in the Coumadin clinic with cardiology office.   #2 chronic atrial fibrillation Currently rate controlled on Toprol-XL. Patient has been adamant in cardiologist's office in the past about not been placed on anticoagulation. Patient has been started on Coumadin per cardiology recommendations secondary to problem #1. Per cardiology.  #3 hypothyroidism Continue Synthroid.  #4 COPD Patient with improvement with  shortness of breath after being placed on scheduled nebulizers, Pulmicort, Mucinex. Patient is status post 1 dose IV Solu-Medrol. Follow.   #5 type 2 diabetes mellitus Sliding scale insulin.  #6 status post mitral valve repair/aortic valve repair Bioprosthetic valves. Continue cardiac medications. Continue aspirin. Per cardiology.  #7 coronary artery disease status post bypass and redo bypass 2013 Stable. Patient denies any chest pain. Continue current regimen of aspirin, Lipitor, Lasix.  #8 right upper lobe lung cancer CT scan with concern for progression. Oncology has been informed via Epic of patient's admission. Tried calling patient's primary oncologist however got a voicemail. Patient already has an appointment for October 2 with her oncologist. Outpatient follow-up.  #9 epistaxis/streaks of blood in sputum Patient does have history of lung cancer and per CT chest there was some concern for progressive lung cancer. IV heparin was discontinued yesterday secondary to epistaxis and streaks of blood in the sputum. Patient with no further epistaxis and improvement with streaks of blood in the sputum. IV heparin was resumed last night with no further significant bleeding. Continue Coumadin secondary to left atrial thrombus per cardiology recommendations. Follow closely.   DVT prophylaxis: IV heparin /Coumadin Code Status: Full Family Communication: Updated patient and daughter at bedside. Disposition Plan: Home when medically stable and improvement with nasal bleeding and no further hemoptysis and when INR is therapeutic.    Consultants:   Cardiology: Dr.Skains 12/08/2015  Procedures:   2-D echo 12/09/2015  Chest x-ray 12/07/2015  Antimicrobials:   None   Subjective: Patient states improvement with shortness of breath.  Patient denies any  chest pain. Patient states no further epistaxis. Hemoptysis improved.   Objective: Vitals:   12/10/15 2048 12/11/15 0543 12/11/15 0801  12/11/15 0947  BP:  133/68 (!) 142/79   Pulse:  73    Resp:  16    Temp:  98 F (36.7 C)    TempSrc:  Oral    SpO2: 100% 99% 96% 100%  Weight:  58.7 kg (129 lb 4.8 oz)    Height:        Intake/Output Summary (Last 24 hours) at 12/11/15 1340 Last data filed at 12/11/15 0836  Gross per 24 hour  Intake            637.6 ml  Output              400 ml  Net            237.6 ml   Filed Weights   12/09/15 0454 12/10/15 0518 12/11/15 0543  Weight: 60.1 kg (132 lb 8 oz) 58.7 kg (129 lb 6.4 oz) 58.7 kg (129 lb 4.8 oz)    Examination:  General exam: Appears calm and comfortable  Respiratory system: Fair air movement. No wheezing.  Cardiovascular system: S1 & S2 heard, Irregularly irregular. 2/6 SEM. No JVD, rubs, gallops or clicks. No pedal edema. Gastrointestinal system: Abdomen is nondistended, soft and nontender. No organomegaly or masses felt. Normal bowel sounds heard. Central nervous system: Alert and oriented. No focal neurological deficits. Extremities: Symmetric 5 x 5 power. Skin: No rashes, lesions or ulcers Psychiatry: Judgement and insight appear normal. Mood & affect appropriate.     Data Reviewed: I have personally reviewed following labs and imaging studies  CBC:  Recent Labs Lab 12/07/15 0740 12/07/15 1736 12/08/15 0503 12/09/15 0524 12/10/15 0416 12/11/15 0453  WBC 8.8 9.5 8.9 8.2 11.0* 10.5  NEUTROABS 7.0*  --  6.8  --   --   --   HGB 10.8* 11.1* 10.1* 10.7* 11.3* 9.7*  HCT 34.0* 37.5 33.9* 35.5* 37.4 33.5*  MCV 75.4* 80.6 79.4 79.8 79.2 80.5  PLT 209 224 207 200 223 627   Basic Metabolic Panel:  Recent Labs Lab 12/07/15 0740 12/07/15 1736 12/08/15 0503 12/09/15 0524 12/10/15 0416  NA 145 140 139 138 138  K 3.5 3.6 3.0* 3.2* 4.0  CL  --  104 103 100* 101  CO2 '28 26 26 31 25  '$ GLUCOSE 107 198* 87 80 168*  BUN 23.1 21* '17 15 18  '$ CREATININE 0.8 0.85 0.74 0.81 0.79  CALCIUM 9.3 9.5 9.0 8.8* 8.8*   GFR: Estimated Creatinine Clearance: 46.4  mL/min (by C-G formula based on SCr of 0.79 mg/dL). Liver Function Tests:  Recent Labs Lab 12/07/15 0740 12/08/15 0503  AST 19 26  ALT 19 19  ALKPHOS 127 95  BILITOT 0.62 0.6  PROT 6.9 6.3*  ALBUMIN 3.2* 3.2*   No results for input(s): LIPASE, AMYLASE in the last 168 hours. No results for input(s): AMMONIA in the last 168 hours. Coagulation Profile:  Recent Labs Lab 12/09/15 1152 12/10/15 0416 12/11/15 0453  INR 1.05 1.06 1.63   Cardiac Enzymes:  Recent Labs Lab 12/08/15 0503  TROPONINI <0.03   BNP (last 3 results) No results for input(s): PROBNP in the last 8760 hours. HbA1C: No results for input(s): HGBA1C in the last 72 hours. CBG:  Recent Labs Lab 12/10/15 1119 12/10/15 1707 12/10/15 2039 12/11/15 0748 12/11/15 1123  GLUCAP 108* 105* 104* 76 75   Lipid Profile: No results for input(s): CHOL,  HDL, LDLCALC, TRIG, CHOLHDL, LDLDIRECT in the last 72 hours. Thyroid Function Tests: No results for input(s): TSH, T4TOTAL, FREET4, T3FREE, THYROIDAB in the last 72 hours. Anemia Panel: No results for input(s): VITAMINB12, FOLATE, FERRITIN, TIBC, IRON, RETICCTPCT in the last 72 hours. Sepsis Labs: No results for input(s): PROCALCITON, LATICACIDVEN in the last 168 hours.  No results found for this or any previous visit (from the past 240 hour(s)).       Radiology Studies: No results found.      Scheduled Meds: . aspirin EC  81 mg Oral Daily  . atorvastatin  40 mg Oral q1800  . budesonide (PULMICORT) nebulizer solution  0.25 mg Nebulization BID  . furosemide  20 mg Oral QPM  . furosemide  40 mg Oral Daily  . glimepiride  1 mg Oral Q breakfast  . guaiFENesin  1,200 mg Oral BID  . insulin aspart  0-9 Units Subcutaneous TID WC  . ipratropium-albuterol  3 mL Nebulization BID  . levothyroxine  100 mcg Oral QAC breakfast  . metoprolol succinate  50 mg Oral Daily  . multivitamin with minerals  1 tablet Oral Daily  . pantoprazole  40 mg Oral Daily  .  potassium chloride SA  20 mEq Oral Daily  . rOPINIRole  4 mg Oral QHS  . vitamin C  500 mg Oral Daily  . warfarin  2.5 mg Oral ONCE-1800  . Warfarin - Pharmacist Dosing Inpatient   Does not apply q1800   Continuous Infusions: . heparin 1,100 Units/hr (12/11/15 0804)     LOS: 3 days    Time spent: 32 minutes    Cerina Leary, MD [facility] Pager [phone]  If 7PM-7AM, please contact night-coverage [email or web address] Password [email or web address] 12/11/2015, 1:40 PM

## 2015-12-11 NOTE — Progress Notes (Signed)
[facility] for Heparin/Coumadin Indication: Left atrial thrombus  Allergies  Allergen Reactions  . Coumadin [Warfarin Sodium] Other (See Comments)    Reaction: Bleeding from ears  . Prozac [Fluoxetine Hcl] Rash    Patient Measurements: Height: '5\' 3"'$  (160 cm) Weight: 129 lb 4.8 oz (58.7 kg) IBW/kg (Calculated) : 52.4 Heparin Dosing Weight: 59 kg  Vital Signs: Temp: 98 F (36.7 C) (09/22 0543) Temp Source: Oral (09/22 0543) BP: 133/68 (09/22 0543) Pulse Rate: 73 (09/22 0543)  Labs:  Recent Labs  12/09/15 0524 12/09/15 1152 12/09/15 2048 12/10/15 0416 12/11/15 0453  HGB 10.7*  --   --  11.3* 9.7*  HCT 35.5*  --   --  37.4 33.5*  PLT 200  --   --  223 207  LABPROT  --  13.7  --  13.8 19.5*  INR  --  1.05  --  1.06 1.63  HEPARINUNFRC  --  0.43 0.77* 0.32 0.79*  CREATININE 0.81  --   --  0.79  --     Estimated Creatinine Clearance: 46.4 mL/min (by C-G formula based on SCr of 0.79 mg/dL).  Assessment: 82 y.o. female with LA thrombus for heparin and Coumadin   Goal of Therapy:  INR= 2-3 Heparin level 0.3-0.7 units/ml Monitor platelets by anticoagulation protocol: Yes   Plan:  Decrease Heparin 1100 units/hr Coumadin 2.5 mg today  Phillis Knack, PharmD, BCPS  12/11/2015 7:33 AM

## 2015-12-12 LAB — CBC
HCT: 33.8 % — ABNORMAL LOW (ref 36.0–46.0)
Hemoglobin: 9.9 g/dL — ABNORMAL LOW (ref 12.0–15.0)
MCH: 23.3 pg — AB (ref 26.0–34.0)
MCHC: 29.3 g/dL — AB (ref 30.0–36.0)
MCV: 79.5 fL (ref 78.0–100.0)
PLATELETS: 222 10*3/uL (ref 150–400)
RBC: 4.25 MIL/uL (ref 3.87–5.11)
RDW: 17 % — AB (ref 11.5–15.5)
WBC: 10.8 10*3/uL — ABNORMAL HIGH (ref 4.0–10.5)

## 2015-12-12 LAB — GLUCOSE, CAPILLARY
GLUCOSE-CAPILLARY: 122 mg/dL — AB (ref 65–99)
GLUCOSE-CAPILLARY: 77 mg/dL (ref 65–99)
Glucose-Capillary: 105 mg/dL — ABNORMAL HIGH (ref 65–99)
Glucose-Capillary: 115 mg/dL — ABNORMAL HIGH (ref 65–99)

## 2015-12-12 LAB — HEPARIN LEVEL (UNFRACTIONATED)
Heparin Unfractionated: 1.1 IU/mL — ABNORMAL HIGH (ref 0.30–0.70)
Heparin Unfractionated: 0.62 IU/mL (ref 0.30–0.70)

## 2015-12-12 LAB — PROTIME-INR
INR: 2.44
Prothrombin Time: 26.9 seconds — ABNORMAL HIGH (ref 11.4–15.2)

## 2015-12-12 MED ORDER — HEPARIN (PORCINE) IN NACL 100-0.45 UNIT/ML-% IJ SOLN
900.0000 [IU]/h | INTRAMUSCULAR | Status: DC
  Administered 2015-12-12: 900 [IU]/h via INTRAVENOUS
  Filled 2015-12-12: qty 250

## 2015-12-12 NOTE — Progress Notes (Signed)
Pt experiencing some bleeding from her nose and also has blood present in her sputum. Notified NP. Will continue to monitor

## 2015-12-12 NOTE — Progress Notes (Signed)
ANTICOAGULATION CONSULT NOTE - Follow Up Consult  Pharmacy Consult for Heparin/Warfain Indication: atrial fibrillation and LA thrombus  Assessment: 14 yoF with LA thrombus and history of AFib and bioprosthetic AVR/MVR being managed with heparin and warfarin. Warfarin was added on 9/20, and heparin was held for noted nose bleed and minimal hemoptysis. Heparin was restarted on 9/22. This morning's heparin level was elevated despite a rate decrease yesterday morning. RN notes hemoptysis with no other bleeding. Heparin was held for 1 hr, then restarted at a lower rate of 900 units/hr. Next heparin level is at 1400 today.   Today's INR increased from 1.63 to 2.44 with a '5mg'$  dose on 9/20-21, and 2.'5mg'$  on 9/22. With the rapid increase in INR, tonight's dose of warfarin will be held.   Goal of Therapy:  INR 2-3 Monitor platelets by anticoagulation protocol: Yes   Plan:  Hold warfarin x 1 dose tonight  Monitor daily INR and s/sx's bleeding Follow-up with heparin level at 1400, and adjust as necessary    Allergies  Allergen Reactions  . Coumadin [Warfarin Sodium] Other (See Comments)    Reaction: Bleeding from ears  . Prozac [Fluoxetine Hcl] Rash    Patient Measurements: Height: '5\' 3"'$  (160 cm) Weight: 128 lb 9.6 oz (58.3 kg) IBW/kg (Calculated) : 52.4 Heparin Dosing Weight: 59kg  Vital Signs: Temp: 97.6 F (36.4 C) (09/23 0500) Temp Source: Oral (09/23 0500) BP: 135/76 (09/23 0500) Pulse Rate: 95 (09/23 0500)  Labs:  Recent Labs  12/10/15 0416 12/11/15 0453 12/12/15 0406  HGB 11.3* 9.7* 9.9*  HCT 37.4 33.5* 33.8*  PLT 223 207 222  LABPROT 13.8 19.5* 26.9*  INR 1.06 1.63 2.44  HEPARINUNFRC 0.32 0.79* 1.10*  CREATININE 0.79  --   --     Estimated Creatinine Clearance: 46.4 mL/min (by C-G formula based on SCr of 0.79 mg/dL).  Medications:  Scheduled:  . aspirin EC  81 mg Oral Daily  . atorvastatin  40 mg Oral q1800  . budesonide (PULMICORT) nebulizer solution  0.25  mg Nebulization BID  . furosemide  20 mg Oral QPM  . furosemide  40 mg Oral Daily  . glimepiride  1 mg Oral Q breakfast  . guaiFENesin  1,200 mg Oral BID  . insulin aspart  0-9 Units Subcutaneous TID WC  . ipratropium-albuterol  3 mL Nebulization BID  . levothyroxine  100 mcg Oral QAC breakfast  . metoprolol succinate  50 mg Oral Daily  . multivitamin with minerals  1 tablet Oral Daily  . pantoprazole  40 mg Oral Daily  . potassium chloride SA  20 mEq Oral Daily  . rOPINIRole  4 mg Oral QHS  . vitamin C  500 mg Oral Daily  . Warfarin - Pharmacist Dosing Inpatient   Does not apply q1800   Infusions:  . heparin 900 Units/hr (12/12/15 5027)    Belia Heman, PharmD PGY1 Pharmacy Resident [phone] (Pager) 12/12/2015 9:47 AM

## 2015-12-12 NOTE — Progress Notes (Signed)
ANTICOAGULATION CONSULT NOTE - Follow Up Consult  Pharmacy Consult for heparin Indication: left artial thrombus  Labs:  Recent Labs  12/10/15 0416 12/11/15 0453 12/12/15 0406  HGB 11.3* 9.7* 9.9*  HCT 37.4 33.5* 33.8*  PLT 223 207 222  LABPROT 13.8 19.5* 26.9*  INR 1.06 1.63 2.44  HEPARINUNFRC 0.32 0.79* 1.10*  CREATININE 0.79  --   --     Assessment: 82yo female now w/ higher heparin level despite rate decrease yesterday am; RN reports blood in sputum but H/H stable.  Goal of Therapy:  Heparin level 0.3-0.7 units/ml   Plan:  Will hold heparin gtt x1hr then resume heparin gtt at lower rate of 900 units/hr and check level in [place], PharmD, BCPS  12/12/2015,6:57 AM

## 2015-12-12 NOTE — Progress Notes (Signed)
ANTICOAGULATION CONSULT NOTE - Follow Up Consult  Pharmacy Consult for heparin Indication: left artial thrombus  Labs:  Recent Labs  12/10/15 0416 12/11/15 0453 12/12/15 0406 12/12/15 1343  HGB 11.3* 9.7* 9.9*  --   HCT 37.4 33.5* 33.8*  --   PLT 223 207 222  --   LABPROT 13.8 19.5* 26.9*  --   INR 1.06 1.63 2.44  --   HEPARINUNFRC 0.32 0.79* 1.10* 0.62  CREATININE 0.79  --   --   --     Assessment: 82yo female now w/ higher heparin level despite rate decrease yesterday am; RN reports blood in sputum but H/H stable.  Repeat HL is now therapeutic at 0.62 on heparin 900 units/hr. No issues with infusion or bleeding noted.  Goal of Therapy:  Heparin level 0.3-0.7 units/ml   Plan:  Continue heparin 900 units/hr Daily HL/CBC Monitor s/sx of bleeding  Andrey Cota. Diona Foley, PharmD, BCPS Clinical Pharmacist Pager [phone] 12/12/2015,2:52 PM

## 2015-12-12 NOTE — Progress Notes (Signed)
Pt is still coughing up blood. No other bleeding noted. Made NP aware. Will continue to monitor.

## 2015-12-12 NOTE — Progress Notes (Signed)
PROGRESS NOTE    Marissa [name]  QPY:[id number] DOB: 1933-12-03 DOA: 12/07/2015 PCP: [name], MD    Brief Narrative:   Marissa [name] is a 82 y.o. female with lung cancer who has had a routine CT chest which showed possible left atrial thrombus was advised to come to the ER for further management. CT chest shows possible left atrial thrombus for which patient was started on IV heparin in the ER and admitted for further observation and management. Patient is not short of breath, has nonspecific chest pain in the right side.     Assessment & Plan:   Principal Problem:   Left atrial thrombus (HCC) Active Problems:   A-fib (HCC)   Hypothyroidism   COPD (chronic obstructive pulmonary disease) (HCC)   Primary cancer of right upper lobe of lung (HCC)   Type 2 diabetes mellitus ([place])   Aortic valve replaced   Mitral valve replaced   Epistaxis  #1 left atrial thrombus Per CT of the chest done prior to admission. Patient status post 2-D echo with EF of 60-65% with no wall motion abnormalities. Bioprosthesis noted in the aortic and mitral valve. Left atrium was moderately to severely dilated. No thrombus was seen in the left atrium. Per cardiology left atrial thrombus on CT scan can clearly be seen and feel no need for a TEE at this time. Due to patient's epistaxis and streaks of blood in sputum IV heparin was discontinued  morning of 12/10/2015  and resumed the eveningof 12/10/2015  with no further significant bleeding.  patient with some hemoptysis however hemoglobin stable. Rapid rise in INR. Continue Coumadin per cardiology recommendations.  will need INR to be therapeutic with overlap of 2 days. Pharmacy managing Coumadin. Patient may follow-up in the Coumadin clinic with cardiology office.   #2 chronic atrial fibrillation Currently rate controlled on Toprol-XL. Patient has been adamant in cardiologist's office in the past about not been placed on anticoagulation. Patient has  been started on Coumadin per cardiology recommendations secondary to problem #1. Per cardiology.  #3 hypothyroidism Continue Synthroid.  #4 COPD Patient with improvement with shortness of breath after being placed on scheduled nebulizers, Pulmicort, Mucinex. Patient is status post 1 dose IV Solu-Medrol. Follow.   #5 type 2 diabetes mellitus Sliding scale insulin.  #6 status post mitral valve repair/aortic valve repair Bioprosthetic valves. Continue cardiac medications. Continue aspirin. Per cardiology.  #7 coronary artery disease status post bypass and redo bypass 2013 Stable. Patient denies any chest pain. Continue current regimen of aspirin, Lipitor, Lasix.  #8 right upper lobe lung cancer CT scan with concern for progression. Oncology has been informed via Epic of patient's admission. Tried calling patient's primary oncologist however got a voicemail. Patient already has an appointment for October 2 with her oncologist. Outpatient follow-up.  #9 epistaxis/streaks of blood in sputum Patient does have history of lung cancer and per CT chest there was some concern for progressive lung cancer. IV heparin was discontinued 12/10/2015, secondary to epistaxis and streaks of blood in the sputum. Patient with no further epistaxis and improvement with streaks of blood in the sputum. IV heparin was resumed the night of 12/10/2015, patient with some hemoptysis however no significant change with hemoglobin. Continue Coumadin secondary to left atrial thrombus per cardiology recommendations. Follow closely.   DVT prophylaxis: IV heparin /Coumadin Code Status: Full Family Communication: Updated patient and daughter at bedside. Disposition Plan: Home when medically stable and improvement with nasal bleeding and no further hemoptysis and when  INR is therapeutic.    Consultants:   Cardiology: Dr.Skains 12/08/2015  Procedures:   2-D echo 12/09/2015  Chest x-ray 12/07/2015  Antimicrobials:    None   Subjective: Patient states improvement with shortness of breath.  Patient denies any chest pain. Patient states no further epistaxis. Patient still with some hemoptysis.  Objective: Vitals:   12/11/15 1920 12/11/15 2026 12/12/15 0500 12/12/15 1401  BP: (!) 100/58  135/76 113/60  Pulse: 88  95 85  Resp: 20  20 (!) 22  Temp: 98 F (36.7 C)  97.6 F (36.4 C) 98.2 F (36.8 C)  TempSrc: Oral  Oral Oral  SpO2: 99% 96% 99% 98%  Weight:   58.3 kg (128 lb 9.6 oz)   Height:        Intake/Output Summary (Last 24 hours) at 12/12/15 1646 Last data filed at 12/12/15 1300  Gross per 24 hour  Intake           592.07 ml  Output              900 ml  Net          -307.93 ml   Filed Weights   12/10/15 0518 12/11/15 0543 12/12/15 0500  Weight: 58.7 kg (129 lb 6.4 oz) 58.7 kg (129 lb 4.8 oz) 58.3 kg (128 lb 9.6 oz)    Examination:  General exam: Appears calm and comfortable  Respiratory system: Fair air movement. No wheezing.  Cardiovascular system: S1 & S2 heard, Irregularly irregular. 2/6 SEM. No JVD, rubs, gallops or clicks. No pedal edema. Gastrointestinal system: Abdomen is nondistended, soft and nontender. No organomegaly or masses felt. Normal bowel sounds heard. Central nervous system: Alert and oriented. No focal neurological deficits. Extremities: Symmetric 5 x 5 power. Skin: No rashes, lesions or ulcers Psychiatry: Judgement and insight appear normal. Mood & affect appropriate.     Data Reviewed: I have personally reviewed following labs and imaging studies  CBC:  Recent Labs Lab 12/07/15 0740  12/08/15 0503 12/09/15 0524 12/10/15 0416 12/11/15 0453 12/12/15 0406  WBC 8.8  < > 8.9 8.2 11.0* 10.5 10.8*  NEUTROABS 7.0*  --  6.8  --   --   --   --   HGB 10.8*  < > 10.1* 10.7* 11.3* 9.7* 9.9*  HCT 34.0*  < > 33.9* 35.5* 37.4 33.5* 33.8*  MCV 75.4*  < > 79.4 79.8 79.2 80.5 79.5  PLT 209  < > 207 200 223 207 222  < > = values in this interval not  displayed. Basic Metabolic Panel:  Recent Labs Lab 12/07/15 0740 12/07/15 1736 12/08/15 0503 12/09/15 0524 12/10/15 0416  NA 145 140 139 138 138  K 3.5 3.6 3.0* 3.2* 4.0  CL  --  104 103 100* 101  CO2 '28 26 26 31 25  '$ GLUCOSE 107 198* 87 80 168*  BUN 23.1 21* '17 15 18  '$ CREATININE 0.8 0.85 0.74 0.81 0.79  CALCIUM 9.3 9.5 9.0 8.8* 8.8*   GFR: Estimated Creatinine Clearance: 46.4 mL/min (by C-G formula based on SCr of 0.79 mg/dL). Liver Function Tests:  Recent Labs Lab 12/07/15 0740 12/08/15 0503  AST 19 26  ALT 19 19  ALKPHOS 127 95  BILITOT 0.62 0.6  PROT 6.9 6.3*  ALBUMIN 3.2* 3.2*   No results for input(s): LIPASE, AMYLASE in the last 168 hours. No results for input(s): AMMONIA in the last 168 hours. Coagulation Profile:  Recent Labs Lab 12/09/15 1152 12/10/15 0416 12/11/15 0453 12/12/15  0406  INR 1.05 1.06 1.63 2.44   Cardiac Enzymes:  Recent Labs Lab 12/08/15 0503  TROPONINI <0.03   BNP (last 3 results) No results for input(s): PROBNP in the last 8760 hours. HbA1C: No results for input(s): HGBA1C in the last 72 hours. CBG:  Recent Labs Lab 12/11/15 1123 12/11/15 1624 12/11/15 2149 12/12/15 0732 12/12/15 1141  GLUCAP 75 86 143* 105* 122*   Lipid Profile: No results for input(s): CHOL, HDL, LDLCALC, TRIG, CHOLHDL, LDLDIRECT in the last 72 hours. Thyroid Function Tests: No results for input(s): TSH, T4TOTAL, FREET4, T3FREE, THYROIDAB in the last 72 hours. Anemia Panel: No results for input(s): VITAMINB12, FOLATE, FERRITIN, TIBC, IRON, RETICCTPCT in the last 72 hours. Sepsis Labs: No results for input(s): PROCALCITON, LATICACIDVEN in the last 168 hours.  No results found for this or any previous visit (from the past 240 hour(s)).       Radiology Studies: No results found.      Scheduled Meds: . aspirin EC  81 mg Oral Daily  . atorvastatin  40 mg Oral q1800  . budesonide (PULMICORT) nebulizer solution  0.25 mg Nebulization BID   . furosemide  20 mg Oral QPM  . furosemide  40 mg Oral Daily  . glimepiride  1 mg Oral Q breakfast  . guaiFENesin  1,200 mg Oral BID  . insulin aspart  0-9 Units Subcutaneous TID WC  . ipratropium-albuterol  3 mL Nebulization BID  . levothyroxine  100 mcg Oral QAC breakfast  . metoprolol succinate  50 mg Oral Daily  . multivitamin with minerals  1 tablet Oral Daily  . pantoprazole  40 mg Oral Daily  . potassium chloride SA  20 mEq Oral Daily  . rOPINIRole  4 mg Oral QHS  . vitamin C  500 mg Oral Daily  . Warfarin - Pharmacist Dosing Inpatient   Does not apply q1800   Continuous Infusions: . heparin 900 Units/hr (12/12/15 0832)     LOS: 4 days    Time spent: 42 minutes    Arshad Oberholzer, MD [facility] Pager [phone]  If 7PM-7AM, please contact night-coverage [email or web address] Password [facility] 12/12/2015, 4:46 PM

## 2015-12-13 LAB — CBC
HCT: 33.6 % — ABNORMAL LOW (ref 36.0–46.0)
HEMOGLOBIN: 9.9 g/dL — AB (ref 12.0–15.0)
MCH: 23.2 pg — ABNORMAL LOW (ref 26.0–34.0)
MCHC: 29.5 g/dL — AB (ref 30.0–36.0)
MCV: 78.9 fL (ref 78.0–100.0)
Platelets: 238 10*3/uL (ref 150–400)
RBC: 4.26 MIL/uL (ref 3.87–5.11)
RDW: 16.9 % — ABNORMAL HIGH (ref 11.5–15.5)
WBC: 12.6 10*3/uL — AB (ref 4.0–10.5)

## 2015-12-13 LAB — GLUCOSE, CAPILLARY
GLUCOSE-CAPILLARY: 120 mg/dL — AB (ref 65–99)
GLUCOSE-CAPILLARY: 68 mg/dL (ref 65–99)
GLUCOSE-CAPILLARY: 85 mg/dL (ref 65–99)
Glucose-Capillary: 92 mg/dL (ref 65–99)
Glucose-Capillary: 63 mg/dL — ABNORMAL LOW (ref 65–99)

## 2015-12-13 LAB — HEPARIN LEVEL (UNFRACTIONATED): HEPARIN UNFRACTIONATED: 0.35 [IU]/mL (ref 0.30–0.70)

## 2015-12-13 LAB — PROTIME-INR
INR: 1.86
PROTHROMBIN TIME: 21.7 s — AB (ref 11.4–15.2)

## 2015-12-13 LAB — HEMOGLOBIN AND HEMATOCRIT, BLOOD
HCT: 35.3 % — ABNORMAL LOW (ref 36.0–46.0)
Hemoglobin: 10.6 g/dL — ABNORMAL LOW (ref 12.0–15.0)

## 2015-12-13 MED ORDER — WARFARIN SODIUM 2.5 MG PO TABS
2.5000 mg | ORAL_TABLET | Freq: Once | ORAL | Status: AC
  Administered 2015-12-13: 2.5 mg via ORAL
  Filled 2015-12-13: qty 1

## 2015-12-13 NOTE — Progress Notes (Signed)
PROGRESS NOTE    Marissa [name]  RDE:[id number] DOB: 03/15/1934 DOA: 12/07/2015 PCP: [name], MD    Brief Narrative:   Marissa [name] is a 82 y.o. female with lung cancer who has had a routine CT chest which showed possible left atrial thrombus was advised to come to the ER for further management. CT chest shows possible left atrial thrombus for which patient was started on IV heparin in the ER and admitted for further observation and management. Patient is not short of breath, has nonspecific chest pain in the right side.     Assessment & Plan:   Principal Problem:   Left atrial thrombus (HCC) Active Problems:   A-fib (HCC)   Hypothyroidism   COPD (chronic obstructive pulmonary disease) (HCC)   Primary cancer of right upper lobe of lung (HCC)   Type 2 diabetes mellitus ([place])   Aortic valve replaced   Mitral valve replaced   Epistaxis  #1 left atrial thrombus Per CT of the chest done prior to admission. Patient status post 2-D echo with EF of 60-65% with no wall motion abnormalities. Bioprosthesis noted in the aortic and mitral valve. Left atrium was moderately to severely dilated. No thrombus was seen in the left atrium. Per cardiology left atrial thrombus on CT scan can clearly be seen and feel no need for a TEE at this time. Due to patient's epistaxis and streaks of blood in sputum IV heparin was discontinued  morning of 12/10/2015  and resumed the eveningof 12/10/2015. Patient with hemoptysis this morning and heparin was turned off, however hemoglobin stable.  Continue Coumadin per cardiology recommendations.  will need INR to be therapeutic with overlap of 2 days. Low INR likely 2.1-2.5 in light of patient's hemoptysis and epistaxis. Pharmacy managing Coumadin. Patient may follow-up in the Coumadin clinic with cardiology office.   #2 chronic atrial fibrillation Currently rate controlled on Toprol-XL. Patient has been adamant in cardiologist's office in the past  about not been placed on anticoagulation. Patient has been started on Coumadin per cardiology recommendations secondary to problem #1. Per cardiology.  #3 hypothyroidism Continue Synthroid.  #4 COPD Patient with improvement with shortness of breath after being placed on scheduled nebulizers, Pulmicort, Mucinex. Patient is status post 1 dose IV Solu-Medrol. Follow.   #5 type 2 diabetes mellitus Sliding scale insulin.  #6 status post mitral valve repair/aortic valve repair Bioprosthetic valves. Continue cardiac medications. Continue aspirin. Per cardiology.  #7 coronary artery disease status post bypass and redo bypass 2013 Stable. Patient denies any chest pain. Continue current regimen of aspirin, Lipitor, Lasix.  #8 right upper lobe lung cancer CT scan with concern for progression. Oncology has been informed via Epic of patient's admission. Tried calling patient's primary oncologist however got a voicemail. Patient already has an appointment for October 2 with her oncologist. Outpatient follow-up.  #9 epistaxis/streaks of blood in sputum Patient does have history of lung cancer and per CT chest there was some concern for progressive lung cancer. IV heparin was discontinued 12/10/2015, secondary to epistaxis and streaks of blood in the sputum. Patient with no further epistaxis.  IV heparin was resumed the night of 12/10/2015, patient with some hemoptysis early this morning, however no significant change with hemoglobin. Heparin was turned off this morning. Continue Coumadin secondary to left atrial thrombus per cardiology recommendations. If improvement with hemoptysis may resume heparin. Follow closely.   DVT prophylaxis: IV heparin /Coumadin Code Status: Full Family Communication: Updated patient and daughter at bedside. Disposition Plan:  Home when medically stable and improvement with nasal bleeding and no further hemoptysis and when INR is therapeutic.    Consultants:    Cardiology: Dr.Skains 12/08/2015  Procedures:   2-D echo 12/09/2015  Chest x-ray 12/07/2015  Antimicrobials:   None   Subjective: Patient with significant hemoptysis per nursing this morning. Heparin to off. Patient denies any epistaxis. No shortness of breath. No chest pain. Per nursing decreasing hemoptysis.   Objective: Vitals:   12/13/15 0500 12/13/15 0800 12/13/15 0901 12/13/15 1414  BP: (!) 138/53 123/70  (!) 114/42  Pulse: 91 (!) 102  90  Resp: (!) 30 (!) 24    Temp: 99.1 F (37.3 C)   98.1 F (36.7 C)  TempSrc: Oral   Oral  SpO2: 93% 90% 92% 97%  Weight:      Height:        Intake/Output Summary (Last 24 hours) at 12/13/15 1704 Last data filed at 12/13/15 1210  Gross per 24 hour  Intake            415.2 ml  Output              950 ml  Net           -534.8 ml   Filed Weights   12/10/15 0518 12/11/15 0543 12/12/15 0500  Weight: 58.7 kg (129 lb 6.4 oz) 58.7 kg (129 lb 4.8 oz) 58.3 kg (128 lb 9.6 oz)    Examination:  General exam: Appears calm and comfortable  Respiratory system: Fair air movement. No wheezing.  Cardiovascular system: S1 & S2 heard, Irregularly irregular. 2/6 SEM. No JVD, rubs, gallops or clicks. No pedal edema. Gastrointestinal system: Abdomen is nondistended, soft and nontender. No organomegaly or masses felt. Normal bowel sounds heard. Central nervous system: Alert and oriented. No focal neurological deficits. Extremities: Symmetric 5 x 5 power. Skin: No rashes, lesions or ulcers Psychiatry: Judgement and insight appear normal. Mood & affect appropriate.     Data Reviewed: I have personally reviewed following labs and imaging studies  CBC:  Recent Labs Lab 12/07/15 0740  12/08/15 0503 12/09/15 0524 12/10/15 0416 12/11/15 0453 12/12/15 0406 12/13/15 0423 12/13/15 1404  WBC 8.8  < > 8.9 8.2 11.0* 10.5 10.8* 12.6*  --   NEUTROABS 7.0*  --  6.8  --   --   --   --   --   --   HGB 10.8*  < > 10.1* 10.7* 11.3* 9.7* 9.9* 9.9*  10.6*  HCT 34.0*  < > 33.9* 35.5* 37.4 33.5* 33.8* 33.6* 35.3*  MCV 75.4*  < > 79.4 79.8 79.2 80.5 79.5 78.9  --   PLT 209  < > 207 200 223 207 222 238  --   < > = values in this interval not displayed. Basic Metabolic Panel:  Recent Labs Lab 12/07/15 0740 12/07/15 1736 12/08/15 0503 12/09/15 0524 12/10/15 0416  NA 145 140 139 138 138  K 3.5 3.6 3.0* 3.2* 4.0  CL  --  104 103 100* 101  CO2 '28 26 26 31 25  '$ GLUCOSE 107 198* 87 80 168*  BUN 23.1 21* '17 15 18  '$ CREATININE 0.8 0.85 0.74 0.81 0.79  CALCIUM 9.3 9.5 9.0 8.8* 8.8*   GFR: Estimated Creatinine Clearance: 46.4 mL/min (by C-G formula based on SCr of 0.79 mg/dL). Liver Function Tests:  Recent Labs Lab 12/07/15 0740 12/08/15 0503  AST 19 26  ALT 19 19  ALKPHOS 127 95  BILITOT 0.62 0.6  PROT  6.9 6.3*  ALBUMIN 3.2* 3.2*   No results for input(s): LIPASE, AMYLASE in the last 168 hours. No results for input(s): AMMONIA in the last 168 hours. Coagulation Profile:  Recent Labs Lab 12/09/15 1152 12/10/15 0416 12/11/15 0453 12/12/15 0406 12/13/15 0423  INR 1.05 1.06 1.63 2.44 1.86   Cardiac Enzymes:  Recent Labs Lab 12/08/15 0503  TROPONINI <0.03   BNP (last 3 results) No results for input(s): PROBNP in the last 8760 hours. HbA1C: No results for input(s): HGBA1C in the last 72 hours. CBG:  Recent Labs Lab 12/12/15 1644 12/12/15 2206 12/13/15 0737 12/13/15 1205 12/13/15 1640  GLUCAP 77 115* 120* 92 68   Lipid Profile: No results for input(s): CHOL, HDL, LDLCALC, TRIG, CHOLHDL, LDLDIRECT in the last 72 hours. Thyroid Function Tests: No results for input(s): TSH, T4TOTAL, FREET4, T3FREE, THYROIDAB in the last 72 hours. Anemia Panel: No results for input(s): VITAMINB12, FOLATE, FERRITIN, TIBC, IRON, RETICCTPCT in the last 72 hours. Sepsis Labs: No results for input(s): PROCALCITON, LATICACIDVEN in the last 168 hours.  No results found for this or any previous visit (from the past 240 hour(s)).        Radiology Studies: No results found.      Scheduled Meds: . aspirin EC  81 mg Oral Daily  . atorvastatin  40 mg Oral q1800  . budesonide (PULMICORT) nebulizer solution  0.25 mg Nebulization BID  . furosemide  20 mg Oral QPM  . furosemide  40 mg Oral Daily  . glimepiride  1 mg Oral Q breakfast  . guaiFENesin  1,200 mg Oral BID  . insulin aspart  0-9 Units Subcutaneous TID WC  . ipratropium-albuterol  3 mL Nebulization BID  . levothyroxine  100 mcg Oral QAC breakfast  . metoprolol succinate  50 mg Oral Daily  . multivitamin with minerals  1 tablet Oral Daily  . pantoprazole  40 mg Oral Daily  . potassium chloride SA  20 mEq Oral Daily  . rOPINIRole  4 mg Oral QHS  . vitamin C  500 mg Oral Daily  . warfarin  2.5 mg Oral ONCE-1800  . Warfarin - Pharmacist Dosing Inpatient   Does not apply q1800   Continuous Infusions:     LOS: 5 days    Time spent: 50 minutes    Camila Maita, MD [facility] Pager [phone]  If 7PM-7AM, please contact night-coverage [email or web address] Password [facility] 12/13/2015, 5:04 PM

## 2015-12-13 NOTE — Progress Notes (Signed)
Notified Dr. Grandville Silos pt having increasing nose bleed and hemoptysis. Heparin dc'd. Will cont to monitor. Carroll Kinds RN

## 2015-12-13 NOTE — Progress Notes (Signed)
ANTICOAGULATION CONSULT NOTE - Follow Up Consult  Pharmacy Consult for Heparin/Warfain Indication: atrial fibrillation and LA thrombus  Assessment: 62 yoF with LA thrombus and history of AFib and bioprosthetic AVR/MVR being managed with heparin and warfarin. Warfarin was added on 9/20, and heparin was held for noted nose bleed and minimal hemoptysis. Heparin was restarted on 9/22, but discontinued again this morning for continued nose bleed and hemoptysis. This morning's heparin level is therapeutic, but would suspect that's no longer the case since heparin is being held. Per Dr. Grandville Silos, if patient's bleeding improves throughout the day, may resume heparin tonight until warfarin is therapeutic.   Today's INR is subtherapeutic at 1.86. Will resume warfarin for tonight since INR is no longer rapidly trending up.   Goal of Therapy:  INR 2-3 Monitor platelets by anticoagulation protocol: Yes   Plan:  Warfarin 2.'5mg'$  x 1 dose tonight  Monitor daily INR and s/sx's bleeding     Allergies  Allergen Reactions  . Coumadin [Warfarin Sodium] Other (See Comments)    Reaction: Bleeding from ears  . Prozac [Fluoxetine Hcl] Rash    Patient Measurements: Height: '5\' 3"'$  (160 cm) Weight: 128 lb 9.6 oz (58.3 kg) IBW/kg (Calculated) : 52.4 Heparin Dosing Weight: 59kg  Vital Signs: Temp: 99.1 F (37.3 C) (09/24 0500) Temp Source: Oral (09/24 0500) BP: 138/53 (09/24 0500) Pulse Rate: 91 (09/24 0500)  Labs:  Recent Labs  12/11/15 0453 12/12/15 0406 12/12/15 1343 12/13/15 0423  HGB 9.7* 9.9*  --  9.9*  HCT 33.5* 33.8*  --  33.6*  PLT 207 222  --  238  LABPROT 19.5* 26.9*  --  21.7*  INR 1.63 2.44  --  1.86  HEPARINUNFRC 0.79* 1.10* 0.62 0.35    Estimated Creatinine Clearance: 46.4 mL/min (by C-G formula based on SCr of 0.79 mg/dL).  Medications:  Scheduled:  . aspirin EC  81 mg Oral Daily  . atorvastatin  40 mg Oral q1800  . budesonide (PULMICORT) nebulizer solution  0.25 mg  Nebulization BID  . furosemide  20 mg Oral QPM  . furosemide  40 mg Oral Daily  . glimepiride  1 mg Oral Q breakfast  . guaiFENesin  1,200 mg Oral BID  . insulin aspart  0-9 Units Subcutaneous TID WC  . ipratropium-albuterol  3 mL Nebulization BID  . levothyroxine  100 mcg Oral QAC breakfast  . metoprolol succinate  50 mg Oral Daily  . multivitamin with minerals  1 tablet Oral Daily  . pantoprazole  40 mg Oral Daily  . potassium chloride SA  20 mEq Oral Daily  . rOPINIRole  4 mg Oral QHS  . vitamin C  500 mg Oral Daily  . warfarin  2.5 mg Oral ONCE-1800  . Warfarin - Pharmacist Dosing Inpatient   Does not apply q1800   Infusions:     Belia Heman, PharmD PGY1 Pharmacy Resident [phone] (Pager) 12/13/2015 7:39 AM

## 2015-12-14 ENCOUNTER — Other Ambulatory Visit: Payer: Self-pay

## 2015-12-14 ENCOUNTER — Ambulatory Visit: Admission: RE | Admit: 2015-12-14 | Payer: Medicare Other | Source: Ambulatory Visit | Admitting: Radiation Oncology

## 2015-12-14 ENCOUNTER — Other Ambulatory Visit: Payer: Self-pay | Admitting: *Deleted

## 2015-12-14 DIAGNOSIS — I5032 Chronic diastolic (congestive) heart failure: Secondary | ICD-10-CM

## 2015-12-14 LAB — CBC
HCT: 31.8 % — ABNORMAL LOW (ref 36.0–46.0)
HEMOGLOBIN: 9.7 g/dL — AB (ref 12.0–15.0)
MCH: 23.7 pg — AB (ref 26.0–34.0)
MCHC: 30.5 g/dL (ref 30.0–36.0)
MCV: 77.6 fL — AB (ref 78.0–100.0)
PLATELETS: 227 10*3/uL (ref 150–400)
RBC: 4.1 MIL/uL (ref 3.87–5.11)
RDW: 16.9 % — ABNORMAL HIGH (ref 11.5–15.5)
WBC: 10.6 10*3/uL — ABNORMAL HIGH (ref 4.0–10.5)

## 2015-12-14 LAB — HEPARIN LEVEL (UNFRACTIONATED): Heparin Unfractionated: <0.1 IU/mL — ABNORMAL LOW (ref 0.30–0.70)

## 2015-12-14 LAB — GLUCOSE, CAPILLARY
GLUCOSE-CAPILLARY: 89 mg/dL (ref 65–99)
GLUCOSE-CAPILLARY: 86 mg/dL (ref 65–99)
GLUCOSE-CAPILLARY: 105 mg/dL — AB (ref 65–99)
Glucose-Capillary: 60 mg/dL — ABNORMAL LOW (ref 65–99)
Glucose-Capillary: 162 mg/dL — ABNORMAL HIGH (ref 65–99)

## 2015-12-14 LAB — PROTIME-INR
INR: 2
Prothrombin Time: 23 seconds — ABNORMAL HIGH (ref 11.4–15.2)

## 2015-12-14 MED ORDER — WARFARIN SODIUM 2.5 MG PO TABS
2.5000 mg | ORAL_TABLET | Freq: Once | ORAL | Status: AC
  Administered 2015-12-14: 2.5 mg via ORAL
  Filled 2015-12-14: qty 1

## 2015-12-14 NOTE — Progress Notes (Signed)
Inpatient Diabetes Program Recommendations  AACE/ADA: New Consensus Statement on Inpatient Glycemic Control (2015)  Target Ranges:  Prepandial:   less than 140 mg/dL      Peak postprandial:   less than 180 mg/dL (1-2 hours)      Critically ill patients:  140 - 180 mg/dL   Lab Results  Component Value Date   GLUCAP 162 (H) 12/14/2015   HGBA1C 7.5 (H) 12/26/2014    Review of Glycemic Control  Results for Marissa, [name] (MRN [id number]) as of 12/14/2015 13:39  Ref. Range 12/13/2015 21:43 12/13/2015 23:59 12/14/2015 07:31 12/14/2015 11:14 12/14/2015 12:04  Glucose-Capillary Latest Ref Range: 65 - 99 mg/dL 63 (L) 85 89 60 (L) 162 (H)    Inpatient Diabetes Program Recommendations:    D/C Amaryl while inpatient to avoid hypoglycemia.  Will continue to follow.  Thank you. Lorenda Peck, RD, LDN, CDE Inpatient Diabetes Coordinator [phone]

## 2015-12-14 NOTE — Progress Notes (Signed)
PROGRESS NOTE    Marissa [name]  VEH:[id number] DOB: May 03, 1933 DOA: 12/07/2015 PCP: [name], MD    Brief Narrative:   Marissa [name] is a 82 y.o. female with lung cancer who has had a routine CT chest which showed possible left atrial thrombus was advised to come to the ER for further management. CT chest shows possible left atrial thrombus for which patient was started on IV heparin in the ER and admitted for further observation and management. Patient is not short of breath, has nonspecific chest pain in the right side.     Assessment & Plan:   Principal Problem:   Left atrial thrombus (HCC) Active Problems:   A-fib (HCC)   Hypothyroidism   COPD (chronic obstructive pulmonary disease) (HCC)   Primary cancer of right upper lobe of lung (HCC)   Type 2 diabetes mellitus ([place])   Aortic valve replaced   Mitral valve replaced   Epistaxis  #1 left atrial thrombus Per CT of the chest done prior to admission. Patient status post 2-D echo with EF of 60-65% with no wall motion abnormalities. Bioprosthesis noted in the aortic and mitral valve. Left atrium was moderately to severely dilated. No thrombus was seen in the left atrium. Per cardiology left atrial thrombus on CT scan can clearly be seen and feel no need for a TEE at this time. Due to patient's epistaxis and streaks of blood in sputum IV heparin was discontinued  morning of 12/10/2015  and resumed the eveningof 12/10/2015. Patient with hemoptysis this morning and heparin was turned off, however hemoglobin stable.  Continue Coumadin per cardiology recommendations.  will need INR to be therapeutic with overlap of 2 days. Low goal INR likely 2.1-2.5 in light of patient's hemoptysis and epistaxis. Pharmacy managing Coumadin. Patient may follow-up in the Coumadin clinic with cardiology office.   #2 chronic atrial fibrillation Currently rate controlled on Toprol-XL. Patient has been adamant in cardiologist's office in the  past about not been placed on anticoagulation. Patient has been started on Coumadin per cardiology recommendations secondary to problem #1. Per cardiology.  #3 hypothyroidism Continue Synthroid.  #4 COPD Patient with improvement with shortness of breath after being placed on scheduled nebulizers, Pulmicort, Mucinex. Patient is status post 1 dose IV Solu-Medrol. Follow.   #5 type 2 diabetes mellitus Sliding scale insulin.  #6 status post mitral valve repair/aortic valve repair Bioprosthetic valves. Continue cardiac medications. Continue aspirin. Per cardiology.  #7 coronary artery disease status post bypass and redo bypass 2013 Stable. Patient denies any chest pain. Continue current regimen of aspirin, Lipitor, Lasix.  #8 right upper lobe lung cancer CT scan with concern for progression. Oncology has been informed via Epic of patient's admission. Tried calling patient's primary oncologist however got a voicemail. Patient already has an appointment for October 2 with her oncologist. Outpatient follow-up.  #9 epistaxis/streaks of blood in sputum Patient does have history of lung cancer and per CT chest there was some concern for progressive lung cancer. IV heparin was discontinued 12/10/2015, secondary to epistaxis and streaks of blood in the sputum. Patient with no further epistaxis.  IV heparin was resumed the night of 12/10/2015, patient with some hemoptysis 12/13/2015 and a such heparin was discontinued. H&H stable. INR today is 2.0.  Continue Coumadin secondary to left atrial thrombus per cardiology recommendations. Goal INR 2-2.5 due to patient's risk of increased bleeding.   DVT prophylaxis: IV heparin /Coumadin Code Status: Full Family Communication: Updated patient and daughter at bedside. Disposition  Plan: Home when medically stable and improvement with nasal bleeding and no further hemoptysis and when INR is therapeutic, hopefully tomorrow.    Consultants:   Cardiology:  Dr.Skains 12/08/2015  Procedures:   2-D echo 12/09/2015  Chest x-ray 12/07/2015  Antimicrobials:   None   Subjective: Patient sitting up in bed reading a book. Patient with less hemoptysis. Patient denies any epistaxis. Patient denies any chest pain. No worsening shortness of breath.   Objective: Vitals:   12/13/15 2144 12/14/15 0502 12/14/15 0815 12/14/15 1443  BP: 111/67 (!) 110/50  126/65  Pulse: 96 72  92  Resp: (!) 28 (!) 26  (!) 29  Temp: 98.2 F (36.8 C) 98.2 F (36.8 C)  99.1 F (37.3 C)  TempSrc: Oral Oral  Oral  SpO2: 94% 100% 98% 98%  Weight:  57.7 kg (127 lb 1.6 oz)    Height:        Intake/Output Summary (Last 24 hours) at 12/14/15 1849 Last data filed at 12/14/15 1659  Gross per 24 hour  Intake              120 ml  Output              700 ml  Net             -580 ml   Filed Weights   12/11/15 0543 12/12/15 0500 12/14/15 0502  Weight: 58.7 kg (129 lb 4.8 oz) 58.3 kg (128 lb 9.6 oz) 57.7 kg (127 lb 1.6 oz)    Examination:  General exam: Appears calm and comfortable  Respiratory system: Fair air movement. No wheezing.  Cardiovascular system: S1 & S2 heard, Irregularly irregular. 2/6 SEM. No JVD, rubs, gallops or clicks. No pedal edema. Gastrointestinal system: Abdomen is nondistended, soft and nontender. No organomegaly or masses felt. Normal bowel sounds heard. Central nervous system: Alert and oriented. No focal neurological deficits. Extremities: Symmetric 5 x 5 power. Skin: No rashes, lesions or ulcers Psychiatry: Judgement and insight appear normal. Mood & affect appropriate.     Data Reviewed: I have personally reviewed following labs and imaging studies  CBC:  Recent Labs Lab 12/08/15 0503  12/10/15 0416 12/11/15 0453 12/12/15 0406 12/13/15 0423 12/13/15 1404 12/14/15 0601  WBC 8.9  < > 11.0* 10.5 10.8* 12.6*  --  10.6*  NEUTROABS 6.8  --   --   --   --   --   --   --   HGB 10.1*  < > 11.3* 9.7* 9.9* 9.9* 10.6* 9.7*  HCT 33.9*   < > 37.4 33.5* 33.8* 33.6* 35.3* 31.8*  MCV 79.4  < > 79.2 80.5 79.5 78.9  --  77.6*  PLT 207  < > 223 207 222 238  --  227  < > = values in this interval not displayed. Basic Metabolic Panel:  Recent Labs Lab 12/08/15 0503 12/09/15 0524 12/10/15 0416  NA 139 138 138  K 3.0* 3.2* 4.0  CL 103 100* 101  CO2 '26 31 25  '$ GLUCOSE 87 80 168*  BUN '17 15 18  '$ CREATININE 0.74 0.81 0.79  CALCIUM 9.0 8.8* 8.8*   GFR: Estimated Creatinine Clearance: 46.4 mL/min (by C-G formula based on SCr of 0.79 mg/dL). Liver Function Tests:  Recent Labs Lab 12/08/15 0503  AST 26  ALT 19  ALKPHOS 95  BILITOT 0.6  PROT 6.3*  ALBUMIN 3.2*   No results for input(s): LIPASE, AMYLASE in the last 168 hours. No results for input(s): AMMONIA  in the last 168 hours. Coagulation Profile:  Recent Labs Lab 12/10/15 0416 12/11/15 0453 12/12/15 0406 12/13/15 0423 12/14/15 0601  INR 1.06 1.63 2.44 1.86 2.00   Cardiac Enzymes:  Recent Labs Lab 12/08/15 0503  TROPONINI <0.03   BNP (last 3 results) No results for input(s): PROBNP in the last 8760 hours. HbA1C: No results for input(s): HGBA1C in the last 72 hours. CBG:  Recent Labs Lab 12/13/15 2359 12/14/15 0731 12/14/15 1114 12/14/15 1204 12/14/15 1613  GLUCAP 85 89 60* 162* 86   Lipid Profile: No results for input(s): CHOL, HDL, LDLCALC, TRIG, CHOLHDL, LDLDIRECT in the last 72 hours. Thyroid Function Tests: No results for input(s): TSH, T4TOTAL, FREET4, T3FREE, THYROIDAB in the last 72 hours. Anemia Panel: No results for input(s): VITAMINB12, FOLATE, FERRITIN, TIBC, IRON, RETICCTPCT in the last 72 hours. Sepsis Labs: No results for input(s): PROCALCITON, LATICACIDVEN in the last 168 hours.  No results found for this or any previous visit (from the past 240 hour(s)).       Radiology Studies: No results found.      Scheduled Meds: . aspirin EC  81 mg Oral Daily  . atorvastatin  40 mg Oral q1800  . budesonide (PULMICORT)  nebulizer solution  0.25 mg Nebulization BID  . furosemide  20 mg Oral QPM  . furosemide  40 mg Oral Daily  . guaiFENesin  1,200 mg Oral BID  . insulin aspart  0-9 Units Subcutaneous TID WC  . ipratropium-albuterol  3 mL Nebulization BID  . levothyroxine  100 mcg Oral QAC breakfast  . metoprolol succinate  50 mg Oral Daily  . multivitamin with minerals  1 tablet Oral Daily  . pantoprazole  40 mg Oral Daily  . potassium chloride SA  20 mEq Oral Daily  . rOPINIRole  4 mg Oral QHS  . vitamin C  500 mg Oral Daily  . Warfarin - Pharmacist Dosing Inpatient   Does not apply q1800   Continuous Infusions:     LOS: 6 days    Time spent: 85 minutes    THOMPSON,DANIEL, MD [facility] Pager [phone]  If 7PM-7AM, please contact night-coverage [email or web address] Password [facility] 12/14/2015, 6:49 PM

## 2015-12-14 NOTE — Consult Note (Addendum)
   [facility] CM Inpatient Consult   12/14/2015  Marissa [name] Mar 27, 1933 [id number]   Patient screened for long-term disease management services with [facility] program. Martin Majestic to bedside to discuss and offer [facility] services. Patient is agreeable and written consent signed. Explained to patient that she will receive post hospital transition of care calls and will be evaluated for monthly home visits. Confirmed Primary Care MD as Dr. Marisue Humble.  Confirmed best contact number as [phone]. Explained that [facility] will not interfere or replace services provided by home health if she should have it.  Left [facility] Care Management packet and contact information at bedside. She endorses she lives with her daughter, denies having any issues with transportation or with obtaining medications. Uses mail order pharmacy.  Ms. Herschberger has history of lung cancer, CHF, DM, COPD, AFIB. Recent HgA1C noted to be 10.6. Made inpatient RNCM aware that [facility] will follow post discharge.  Marthenia Rolling, MSN-Ed, RN,BSN [facility] Liaison [phone]

## 2015-12-14 NOTE — Care Management Important Message (Signed)
Important Message  Patient Details  Name: Marissa [name] MRN: [id number] Date of Birth: January 30, 1934   Medicare Important Message Given:  Yes    Tereza Gilham [place] 12/14/2015, 11:22 AM

## 2015-12-14 NOTE — Progress Notes (Signed)
ANTICOAGULATION CONSULT NOTE - Follow Up Consult  Pharmacy Consult for Heparin/Warfain Indication: atrial fibrillation and LA thrombus  Assessment: 89 yoF with LA thrombus and history of AFib and bioprosthetic AVR/MVR being managed with heparin and warfarin. Warfarin was added on 9/20, and heparin is on hold due to nose bleeds and hemoptysis (last noted on 9/24) -INR= 2  Goal of Therapy:  Goal INR 2-3; considering low INR goal of 2.1-2.5 per MD note Monitor platelets by anticoagulation protocol: Yes   Plan:  Warfarin 2.'5mg'$  x 1 dose tonight  Daily PT/INR     Allergies  Allergen Reactions  . Coumadin [Warfarin Sodium] Other (See Comments)    Reaction: Bleeding from ears  . Prozac [Fluoxetine Hcl] Rash    Patient Measurements: Height: '5\' 3"'$  (160 cm) Weight: 127 lb 1.6 oz (57.7 kg) IBW/kg (Calculated) : 52.4 Heparin Dosing Weight: 59kg  Vital Signs: Temp: 98.2 F (36.8 C) (09/25 0502) Temp Source: Oral (09/25 0502) BP: 110/50 (09/25 0502) Pulse Rate: 72 (09/25 0502)  Labs:  Recent Labs  12/12/15 0406 12/12/15 1343 12/13/15 0423 12/13/15 1404 12/14/15 0601  HGB 9.9*  --  9.9* 10.6* 9.7*  HCT 33.8*  --  33.6* 35.3* 31.8*  PLT 222  --  238  --  227  LABPROT 26.9*  --  21.7*  --  23.0*  INR 2.44  --  1.86  --  2.00  HEPARINUNFRC 1.10* 0.62 0.35  --  <0.10*    Estimated Creatinine Clearance: 46.4 mL/min (by C-G formula based on SCr of 0.79 mg/dL).  Medications:  Scheduled:  . aspirin EC  81 mg Oral Daily  . atorvastatin  40 mg Oral q1800  . budesonide (PULMICORT) nebulizer solution  0.25 mg Nebulization BID  . furosemide  20 mg Oral QPM  . furosemide  40 mg Oral Daily  . glimepiride  1 mg Oral Q breakfast  . guaiFENesin  1,200 mg Oral BID  . insulin aspart  0-9 Units Subcutaneous TID WC  . ipratropium-albuterol  3 mL Nebulization BID  . levothyroxine  100 mcg Oral QAC breakfast  . metoprolol succinate  50 mg Oral Daily  . multivitamin with minerals  1  tablet Oral Daily  . pantoprazole  40 mg Oral Daily  . potassium chloride SA  20 mEq Oral Daily  . rOPINIRole  4 mg Oral QHS  . vitamin C  500 mg Oral Daily  . Warfarin - Pharmacist Dosing Inpatient   Does not apply q1800   Infusions:    Hildred Laser, Pharm D 12/14/2015 10:21 AM

## 2015-12-15 ENCOUNTER — Inpatient Hospital Stay (HOSPITAL_COMMUNITY): Payer: Medicare Other

## 2015-12-15 DIAGNOSIS — M25571 Pain in right ankle and joints of right foot: Secondary | ICD-10-CM

## 2015-12-15 LAB — GLUCOSE, CAPILLARY
Glucose-Capillary: 84 mg/dL (ref 65–99)
Glucose-Capillary: 75 mg/dL (ref 65–99)
Glucose-Capillary: 90 mg/dL (ref 65–99)
Glucose-Capillary: 298 mg/dL — ABNORMAL HIGH (ref 65–99)

## 2015-12-15 LAB — CBC
HEMATOCRIT: 31.1 % — AB (ref 36.0–46.0)
HEMOGLOBIN: 9.3 g/dL — AB (ref 12.0–15.0)
MCH: 23.4 pg — AB (ref 26.0–34.0)
MCHC: 29.9 g/dL — AB (ref 30.0–36.0)
MCV: 78.3 fL (ref 78.0–100.0)
Platelets: 234 10*3/uL (ref 150–400)
RBC: 3.97 MIL/uL (ref 3.87–5.11)
RDW: 16.9 % — AB (ref 11.5–15.5)
WBC: 10.7 10*3/uL — ABNORMAL HIGH (ref 4.0–10.5)

## 2015-12-15 LAB — BASIC METABOLIC PANEL
ANION GAP: 8 (ref 5–15)
BUN: 16 mg/dL (ref 6–20)
CALCIUM: 9.1 mg/dL (ref 8.9–10.3)
CHLORIDE: 100 mmol/L — AB (ref 101–111)
CO2: 30 mmol/L (ref 22–32)
Creatinine, Ser: 0.83 mg/dL (ref 0.44–1.00)
GFR calc Af Amer: >60 mL/min (ref >60)
GFR calc non Af Amer: >60 mL/min (ref >60)
GLUCOSE: 106 mg/dL — AB (ref 65–99)
POTASSIUM: 3 mmol/L — AB (ref 3.5–5.1)
Sodium: 138 mmol/L (ref 135–145)

## 2015-12-15 LAB — PROTIME-INR
INR: 2.38
PROTHROMBIN TIME: 26.4 s — AB (ref 11.4–15.2)

## 2015-12-15 LAB — URIC ACID: Uric Acid, Serum: 7.2 mg/dL — ABNORMAL HIGH (ref 2.3–6.6)

## 2015-12-15 MED ORDER — POTASSIUM CHLORIDE CRYS ER 20 MEQ PO TBCR
40.0000 meq | EXTENDED_RELEASE_TABLET | ORAL | Status: AC
  Administered 2015-12-15 (×2): 40 meq via ORAL
  Filled 2015-12-15 (×2): qty 2

## 2015-12-15 MED ORDER — WARFARIN SODIUM 1 MG PO TABS: 1.0000 mg | ORAL_TABLET | Freq: Every day | ORAL | 0 refills | Status: DC

## 2015-12-15 MED ORDER — WARFARIN SODIUM 1 MG PO TABS
1.0000 mg | ORAL_TABLET | Freq: Once | ORAL | Status: AC
  Administered 2015-12-15: 1 mg via ORAL
  Filled 2015-12-15: qty 1

## 2015-12-15 MED ORDER — POTASSIUM CHLORIDE 20 MEQ PO PACK: 40.0000 meq | PACK | ORAL | Status: DC

## 2015-12-15 MED ORDER — INFLUENZA VAC SPLIT QUAD 0.5 ML IM SUSY
0.5000 mL | PREFILLED_SYRINGE | Freq: Once | INTRAMUSCULAR | Status: AC
  Administered 2015-12-15: 0.5 mL via INTRAMUSCULAR

## 2015-12-15 MED ORDER — COUMADIN BOOK
Freq: Once | Status: AC
  Administered 2015-12-15: 15:00:00
  Filled 2015-12-15: qty 1

## 2015-12-15 MED ORDER — METHYLPREDNISOLONE SODIUM SUCC 125 MG IJ SOLR
60.0000 mg | Freq: Once | INTRAMUSCULAR | Status: AC
  Administered 2015-12-15: 60 mg via INTRAVENOUS
  Filled 2015-12-15: qty 2

## 2015-12-15 MED ORDER — PREDNISONE 20 MG PO TABS
40.0000 mg | ORAL_TABLET | Freq: Every day | ORAL | Status: DC
  Administered 2015-12-16: 40 mg via ORAL
  Filled 2015-12-15: qty 2

## 2015-12-15 NOTE — Progress Notes (Signed)
ANTICOAGULATION CONSULT NOTE - Follow Up Consult  Pharmacy Consult for Warfain Indication: atrial fibrillation and LA thrombus  Assessment: 24 yoF with LA thrombus and history of AFib and bioprosthetic AVR/MVR being managed with warfarin. Warfarin was added on 9/20, and heparin has been discontinued with INR at goal. Goal INR is on the low end due to recent nose bleeds and hemoptysis (last noted on 9/24) -INR= 2.38 with trend up (this is with holding coumadin on 9/23)   Goal of Therapy:  Goal INR 2-2.5 Monitor platelets by anticoagulation protocol: Yes   Plan:  -Coumadin '1mg'$  po today -Anticipate continued INR trend up. Would consider coumadin '1mg'$  po daily at discharge with an INR check on Thursday or Friday   Allergies  Allergen Reactions  . Coumadin [Warfarin Sodium] Other (See Comments)    Reaction: Bleeding from ears  . Prozac [Fluoxetine Hcl] Rash    Patient Measurements: Height: '5\' 3"'$  (160 cm) Weight: 127 lb 11.2 oz (57.9 kg) IBW/kg (Calculated) : 52.4 Heparin Dosing Weight: 59kg  Vital Signs: Temp: 97.9 F (36.6 C) (09/26 0413) Temp Source: Oral (09/26 0413) BP: 119/48 (09/26 0413) Pulse Rate: 88 (09/26 0413)  Labs:  Recent Labs  12/12/15 1343  12/13/15 0423 12/13/15 1404 12/14/15 0601 12/15/15 0449  HGB  --   < > 9.9* 10.6* 9.7* 9.3*  HCT  --   < > 33.6* 35.3* 31.8* 31.1*  PLT  --   --  238  --  227 234  LABPROT  --   --  21.7*  --  23.0* 26.4*  INR  --   --  1.86  --  2.00 2.38  HEPARINUNFRC 0.62  --  0.35  --  <0.10*  --   CREATININE  --   --   --   --   --  0.83  < > = values in this interval not displayed.  Estimated Creatinine Clearance: 44.7 mL/min (by C-G formula based on SCr of 0.83 mg/dL).  Medications:  Scheduled:  . aspirin EC  81 mg Oral Daily  . atorvastatin  40 mg Oral q1800  . budesonide (PULMICORT) nebulizer solution  0.25 mg Nebulization BID  . furosemide  20 mg Oral QPM  . furosemide  40 mg Oral Daily  . guaiFENesin  1,200 mg  Oral BID  . insulin aspart  0-9 Units Subcutaneous TID WC  . ipratropium-albuterol  3 mL Nebulization BID  . levothyroxine  100 mcg Oral QAC breakfast  . metoprolol succinate  50 mg Oral Daily  . multivitamin with minerals  1 tablet Oral Daily  . pantoprazole  40 mg Oral Daily  . potassium chloride SA  20 mEq Oral Daily  . rOPINIRole  4 mg Oral QHS  . vitamin C  500 mg Oral Daily  . Warfarin - Pharmacist Dosing Inpatient   Does not apply q1800   Infusions:    Hildred Laser, Pharm D 12/15/2015 8:02 AM

## 2015-12-15 NOTE — Progress Notes (Signed)
PROGRESS NOTE    Marissa [name]  ZHY:[id number] DOB: 1933-03-25 DOA: 12/07/2015 PCP: [name], MD    Brief Narrative:   Marissa [name] is a 82 y.o. female with lung cancer who has had a routine CT chest which showed possible left atrial thrombus was advised to come to the ER for further management. CT chest shows possible left atrial thrombus for which patient was started on IV heparin in the ER and admitted for further observation and management. Patient is not short of breath, has nonspecific chest pain in the right side.     Assessment & Plan:   Principal Problem:   Left atrial thrombus (HCC) Active Problems:   A-fib (HCC)   Hypothyroidism   COPD (chronic obstructive pulmonary disease) (HCC)   Primary cancer of right upper lobe of lung (HCC)   Type 2 diabetes mellitus (HCC)   Aortic valve replaced   Mitral valve replaced   Epistaxis   Chronic diastolic congestive heart failure (HCC)   Right ankle pain  #1 left atrial thrombus Per CT of the chest done prior to admission. Patient status post 2-D echo with EF of 60-65% with no wall motion abnormalities. Bioprosthesis noted in the aortic and mitral valve. Left atrium was moderately to severely dilated. No thrombus was seen in the left atrium. Per cardiology left atrial thrombus on CT scan can clearly be seen and feel no need for a TEE at this time. Due to patient's epistaxis and streaks of blood in sputum IV heparin was discontinued  morning of 12/10/2015  and resumed the eveningof 12/10/2015. Patient with hemoptysis this morning and heparin was turned off, however hemoglobin stable.  Continue Coumadin per cardiology recommendations.  INR today is 2.38. INR has been therapeutic 48 hours now. Low goal INR likely 2.1-2.5 in light of patient's hemoptysis and epistaxis. Pharmacy managing Coumadin. Patient may follow-up in the Coumadin clinic with cardiology office.   #2 chronic atrial fibrillation Currently rate controlled  on Toprol-XL. Patient has been adamant in cardiologist's office in the past about not been placed on anticoagulation. Patient has been started on Coumadin per cardiology recommendations secondary to problem #1. Per cardiology.  #3 hypothyroidism Continue Synthroid.  #4 COPD Patient with improvement with shortness of breath after being placed on scheduled nebulizers, Pulmicort, Mucinex. Patient is status post 1 dose IV Solu-Medrol. Follow.   #5 type 2 diabetes mellitus Sliding scale insulin.  #6 status post mitral valve repair/aortic valve repair Bioprosthetic valves. Continue cardiac medications. Continue aspirin. Per cardiology.  #7 coronary artery disease status post bypass and redo bypass 2013 Stable. Patient denies any chest pain. Continue current regimen of aspirin, Lipitor, Lasix.  #8 right upper lobe lung cancer CT scan with concern for progression. Oncology has been informed via Epic of patient's admission. Tried calling patient's primary oncologist however got a voicemail. Patient already has an appointment for October 2 with her oncologist. Outpatient follow-up.  #9 epistaxis/streaks of blood in sputum Patient does have history of lung cancer and per CT chest there was some concern for progressive lung cancer. IV heparin was discontinued 12/10/2015, secondary to epistaxis and streaks of blood in the sputum. Patient with no further epistaxis.  IV heparin was resumed the night of 12/10/2015, patient with some hemoptysis 12/13/2015 and a such heparin was discontinued. H&H stable. INR today is 2.0.  Continue Coumadin secondary to left atrial thrombus per cardiology recommendations. Goal INR 2-2.5 due to patient's risk of increased bleeding.  #10 right medial ankle pain  Patient was to be discharged when nurse called stating that patient was complaining of right medial ankle pain. Daughter hesitant to take patient home without resolution of right medial ankle pain. Likely gout. Check a  uric acid. Check plain films of the right ankle. Solu-Medrol 60 mg IV 1 then prednisone 40 mg daily 4 days.   DVT prophylaxis: IV heparin /Coumadin Code Status: Full Family Communication: Updated patient and daughter at bedside. Disposition Plan: Home when medically stable and right ankle pain improved with therapeutic INR.    Consultants:   Cardiology: Dr.Skains 12/08/2015  Procedures:   2-D echo 12/09/2015  Chest x-ray 12/07/2015  Antimicrobials:   None   Subjective: Patient states hemoptysis improved. No chest pain. No shortness of breath.  Patient was to be discharged when she stated to the nurse that she was having medial right ankle pain. Daughter hesitant to take him home without resolution of his pain.   Objective: Vitals:   12/14/15 2100 12/15/15 0413 12/15/15 1106 12/15/15 1311  BP: (!) 111/53 (!) 119/48 (!) 112/58 (!) 132/58  Pulse: 91 88  94  Resp: 15 (!) 25  (!) 26  Temp: 99 F (37.2 C) 97.9 F (36.6 C)  97.9 F (36.6 C)  TempSrc: Oral Oral  Oral  SpO2: 97% 98%  98%  Weight:  57.9 kg (127 lb 11.2 oz)    Height:        Intake/Output Summary (Last 24 hours) at 12/15/15 1518 Last data filed at 12/15/15 1233  Gross per 24 hour  Intake              740 ml  Output              400 ml  Net              340 ml   Filed Weights   12/12/15 0500 12/14/15 0502 12/15/15 0413  Weight: 58.3 kg (128 lb 9.6 oz) 57.7 kg (127 lb 1.6 oz) 57.9 kg (127 lb 11.2 oz)    Examination:  General exam: Appears calm and comfortable  Respiratory system: Fair air movement. No wheezing.  Cardiovascular system: S1 & S2 heard, Irregularly irregular. 2/6 SEM. No JVD, rubs, gallops or clicks. No pedal edema. Gastrointestinal system: Abdomen is nondistended, soft and nontender. No organomegaly or masses felt. Normal bowel sounds heard. Central nervous system: Alert and oriented. No focal neurological deficits. Extremities: Symmetric 5 x 5 power.Right medial ankle slightly  erythemous and TTP. Skin: No rashes, lesions or ulcers Psychiatry: Judgement and insight appear normal. Mood & affect appropriate.     Data Reviewed: I have personally reviewed following labs and imaging studies  CBC:  Recent Labs Lab 12/11/15 0453 12/12/15 0406 12/13/15 0423 12/13/15 1404 12/14/15 0601 12/15/15 0449  WBC 10.5 10.8* 12.6*  --  10.6* 10.7*  HGB 9.7* 9.9* 9.9* 10.6* 9.7* 9.3*  HCT 33.5* 33.8* 33.6* 35.3* 31.8* 31.1*  MCV 80.5 79.5 78.9  --  77.6* 78.3  PLT 207 222 238  --  227 409   Basic Metabolic Panel:  Recent Labs Lab 12/09/15 0524 12/10/15 0416 12/15/15 0449  NA 138 138 138  K 3.2* 4.0 3.0*  CL 100* 101 100*  CO2 '31 25 30  '$ GLUCOSE 80 168* 106*  BUN '15 18 16  '$ CREATININE 0.81 0.79 0.83  CALCIUM 8.8* 8.8* 9.1   GFR: Estimated Creatinine Clearance: 44.7 mL/min (by C-G formula based on SCr of 0.83 mg/dL). Liver Function Tests: No results for input(s): AST,  ALT, ALKPHOS, BILITOT, PROT, ALBUMIN in the last 168 hours. No results for input(s): LIPASE, AMYLASE in the last 168 hours. No results for input(s): AMMONIA in the last 168 hours. Coagulation Profile:  Recent Labs Lab 12/11/15 0453 12/12/15 0406 12/13/15 0423 12/14/15 0601 12/15/15 0449  INR 1.63 2.44 1.86 2.00 2.38   Cardiac Enzymes: No results for input(s): CKTOTAL, CKMB, CKMBINDEX, TROPONINI in the last 168 hours. BNP (last 3 results) No results for input(s): PROBNP in the last 8760 hours. HbA1C: No results for input(s): HGBA1C in the last 72 hours. CBG:  Recent Labs Lab 12/14/15 1204 12/14/15 1613 12/14/15 2146 12/15/15 0733 12/15/15 1125  GLUCAP 162* 86 105* 84 75   Lipid Profile: No results for input(s): CHOL, HDL, LDLCALC, TRIG, CHOLHDL, LDLDIRECT in the last 72 hours. Thyroid Function Tests: No results for input(s): TSH, T4TOTAL, FREET4, T3FREE, THYROIDAB in the last 72 hours. Anemia Panel: No results for input(s): VITAMINB12, FOLATE, FERRITIN, TIBC, IRON,  RETICCTPCT in the last 72 hours. Sepsis Labs: No results for input(s): PROCALCITON, LATICACIDVEN in the last 168 hours.  No results found for this or any previous visit (from the past 240 hour(s)).       Radiology Studies: No results found.      Scheduled Meds: . aspirin EC  81 mg Oral Daily  . atorvastatin  40 mg Oral q1800  . budesonide (PULMICORT) nebulizer solution  0.25 mg Nebulization BID  . furosemide  20 mg Oral QPM  . furosemide  40 mg Oral Daily  . guaiFENesin  1,200 mg Oral BID  . Influenza vac split quadrivalent PF  0.5 mL Intramuscular Once  . insulin aspart  0-9 Units Subcutaneous TID WC  . ipratropium-albuterol  3 mL Nebulization BID  . levothyroxine  100 mcg Oral QAC breakfast  . methylPREDNISolone (SOLU-MEDROL) injection  60 mg Intravenous Once  . metoprolol succinate  50 mg Oral Daily  . multivitamin with minerals  1 tablet Oral Daily  . pantoprazole  40 mg Oral Daily  . rOPINIRole  4 mg Oral QHS  . vitamin C  500 mg Oral Daily  . warfarin  1 mg Oral ONCE-1800  . Warfarin - Pharmacist Dosing Inpatient   Does not apply q1800   Continuous Infusions:     LOS: 7 days    Time spent: 88 minutes    Antron Seth, MD [facility] Pager [phone]  If 7PM-7AM, please contact night-coverage [email or web address] Password [facility] 12/15/2015, 3:18 PM

## 2015-12-15 NOTE — Progress Notes (Signed)
Coumadin book and influenza vaccine information given to patient.  Prior to pt being discharged patient complained of right ankle swelling that was painful to touch. Dr. Grandville Silos paged. Patient's discharge cancelled. New orders received. Will cont to monitor pt.

## 2015-12-16 DIAGNOSIS — J438 Other emphysema: Secondary | ICD-10-CM

## 2015-12-16 LAB — CBC
HEMATOCRIT: 31.9 % — AB (ref 36.0–46.0)
HEMOGLOBIN: 9.2 g/dL — AB (ref 12.0–15.0)
MCH: 22.9 pg — ABNORMAL LOW (ref 26.0–34.0)
MCHC: 28.8 g/dL — ABNORMAL LOW (ref 30.0–36.0)
MCV: 79.6 fL (ref 78.0–100.0)
Platelets: 235 10*3/uL (ref 150–400)
RBC: 4.01 MIL/uL (ref 3.87–5.11)
RDW: 16.7 % — AB (ref 11.5–15.5)
WBC: 12 10*3/uL — AB (ref 4.0–10.5)

## 2015-12-16 LAB — BASIC METABOLIC PANEL
ANION GAP: 13 (ref 5–15)
BUN: 19 mg/dL (ref 6–20)
CHLORIDE: 99 mmol/L — AB (ref 101–111)
CO2: 27 mmol/L (ref 22–32)
Calcium: 9.2 mg/dL (ref 8.9–10.3)
Creatinine, Ser: 0.79 mg/dL (ref 0.44–1.00)
GFR calc Af Amer: >60 mL/min (ref >60)
Glucose, Bld: 174 mg/dL — ABNORMAL HIGH (ref 65–99)
POTASSIUM: 4.5 mmol/L (ref 3.5–5.1)
SODIUM: 139 mmol/L (ref 135–145)

## 2015-12-16 LAB — PROTIME-INR
INR: 2.45
Prothrombin Time: 27.1 seconds — ABNORMAL HIGH (ref 11.4–15.2)

## 2015-12-16 LAB — GLUCOSE, CAPILLARY: GLUCOSE-CAPILLARY: 151 mg/dL — AB (ref 65–99)

## 2015-12-16 MED ORDER — WARFARIN SODIUM 1 MG PO TABS: 1.0000 mg | ORAL_TABLET | Freq: Once | ORAL | Status: DC

## 2015-12-16 MED ORDER — COLCHICINE 0.6 MG PO TABS: 0.6000 mg | ORAL_TABLET | Freq: Every day | ORAL | 0 refills | Status: DC

## 2015-12-16 MED ORDER — FUROSEMIDE 20 MG PO TABS: 20.0000 mg | ORAL_TABLET | Freq: Two times a day (BID) | ORAL | 0 refills | Status: AC

## 2015-12-16 NOTE — Progress Notes (Signed)
Discharge instructions reviewed with patient and daughter. Pt and daughter aware of patient's follow up appointments and were given the prescriptions. No questions at this time. Pt discharged home with daughter.

## 2015-12-16 NOTE — Progress Notes (Signed)
Report received in patient's room via [facility] using [employer], reviewed orders, VS, meds, labs, tests, POC and patient's general condition, assumed care of patient.

## 2015-12-16 NOTE — Discharge Summary (Addendum)
Physician Discharge Summary  Marissa [name] LPF:[id number] DOB: 07-Oct-1933 DOA: 12/07/2015  PCP: [name], MD  Admit date: 12/07/2015 Discharge date: 12/16/2015  Admitted From: Home Disposition:  Home with home health  Recommendations for Outpatient Follow-up:  1. Follow up with PCP in 1-2 weeks 2. Please follow INR on 09/29 warfarin clinic.  3. Patient has been started on warfarin for anticoagulation. INR therapeutic at time of discharge. 4. Patient has been placed on colchicine for next 5 days, her dose of furosemide has been reduced. Recommend follow-up, uric acid levels in next few weeks.  Home Health: yes Equipment/Devices: Home 02 (chronic) Discharge Condition: Stable CODE STATUS: Full Diet recommendation: Heart Healthy  Brief/Interim Summary: This is a 82 year old female with a past medical history of lung cancer who had a routine CT chest which showed a possible left atrial thrombus. She has chronic dyspnea with hypoxic respiratory failure on home oxygen 2 L flow per nasal cannula. On her initial physical examination her blood pressure was 532 and 992 systolic, heart rate 80, respiratory 16-22, oxygen saturation 98-100%. Heart S1-S2 present, irregularly irregular, her lungs were showing decreased air movement, no rhonchi or rales. Her EKG showed atrial fibrillation with a premature ventricle complexes. CT of the chest from September 18 showed a large left atrial low attenuation defect 2,4-5.3 cm, increasing masslike consolidation and possible separate new necrotic nodule and the right upper lobe with increasing right loculated pleural effusion and new low right internal jugular ill-defined soft tissue, findings all were sent for disease progression. Chest which showed opacity in the right upper lobe with small bilateral pleural effusions.   Patient was admitted to hospital with a working diagnosis of possible left atrial thrombus.  1. Left atrial thrombus. Patient was  anticoagulated with IV heparin, posteriorly was bridged to oral anti-correlation with warfarin. Her INR has been therapeutic since September 25. No signs of complications related to full anticoagulation. Patient received warfarin teaching. She'll follow-up with warfarin clinic on September 29. Patient underwent echocardiography which showed left ventricular systolic function of 42-68%, with increased wall thickness, LVH pattern. Left atrium with no thrombus seen. Cardiology consultation, recommended continue full anticoagulation with warfarin and no need for transesophageal echocardiography.  2. Chronic atrial fibrillation. Patient had refused anticollagen past. At this point she is agreeing to continue anticoagulation. She remained rate controlled with metoprolol succinate. Heart rate between 60 and 90 bpm. No signs of volume overload. Continue loop diuretics per home regimen, along with potassium supplements  3. Coronary artery disease. Patient will continue low-dose aspirin, atorvastatin 40 once daily.   4. Chronic obstructive pulmonary disease. Patient was continued on supplemental oxygen per nasal cannula, will resume inhaled coritcosteroids and long-acting beta 2 agonist with Advair, continue bronchodilator therapy with Combivent. Chronic hypoxic respiratory failure.   5. Lung cancer. Recurrent non-small cell lung cancer, stage IIIa squamous cell carcinoma status post chemotherapy and radiation therapy. Currently on observation. CT showing evidence of disease progression, will follow-up with oncology clinic Dr. Julien Nordmann.  6. Hypothyroidism. Patient was continued on levothyroxine.  7. Type 2 diabetes mellitus. Patient will continue oral hypoglycemic agents. Serum glucose hospital stay ranged between 90 and 151 milligrams per deciliter.  8. Right ankle pain/ possible gout.. Transitory, she received 1 dose of steroids. Uric acid was 7.2. Patient's symptoms clinically improved. Patient's dose of  furosemide has been reduced, colchicine has been prescribed. Recommend follow-up uric acid as an outpatient the next following weeks.  Discharge Diagnoses:  Principal Problem:   Left atrial  thrombus ([place]) Active Problems:   A-fib (HCC)   Hypothyroidism   COPD (chronic obstructive pulmonary disease) (HCC)   Primary cancer of right upper lobe of lung (HCC)   Type 2 diabetes mellitus ([place])   Aortic valve replaced   Mitral valve replaced   Epistaxis   Chronic diastolic congestive heart failure (HCC)   Right ankle pain    Discharge Instructions  Discharge Instructions    AMB Referral to [facility] Management    Complete by:  As directed    Please assign to [place] for transition of care. Currently at [facility]. Written consent obtained. Multiple co-morbidities. Please call with questions. Marthenia Rolling, [place], [facility] Liaison [phone]   Reason for consult:  Please assign to [place] for transition of care   Diagnoses of:   COPD/ Pneumonia Heart Failure Diabetes     Expected date of contact:  1-3 days (reserved for hospital discharges)   Diet - low sodium heart healthy    Complete by:  As directed    Diet - low sodium heart healthy    Complete by:  As directed    Face-to-face encounter (required for Medicare/Medicaid patients)    Complete by:  As directed    I Jimmy Picket Celica Kotowski certify that this patient is under my care and that I, or a nurse practitioner or physician's assistant working with me, had a face-to-face encounter that meets the physician face-to-face encounter requirements with this patient on 12/16/2015. The encounter with the patient was in whole, or in part for the following medical condition(s) which is the primary reason for home health care (List medical condition):  Hypoxic respiratory failure with left atrial thrombus and deconditioning.   The encounter with the patient was in whole, or in part, for the  following medical condition, which is the primary reason for home health care:  left atrial thrombus   I certify that, based on my findings, the following services are medically necessary home health services:   Nursing Physical therapy     Reason for Medically Necessary Home Health Services:   Skilled Nursing- Changes in Medication/Medication Management Therapy- Therapeutic Exercises to Increase Strength and Endurance     My clinical findings support the need for the above services:  Shortness of breath with activity   Further, I certify that my clinical findings support that this patient is homebound due to:  Shortness of Breath with activity   Home Health    Complete by:  As directed    To provide the following care/treatments:   PT RN     Increase activity slowly    Complete by:  As directed    Increase activity slowly    Complete by:  As directed        Medication List    TAKE these medications   acetaminophen 325 MG tablet Commonly known as:  TYLENOL Take 2 tablets (650 mg total) by mouth every 6 (six) hours as needed for mild pain (or Fever >/= 101).   alendronate 70 MG tablet Commonly known as:  FOSAMAX Take 70 mg by mouth once a week.   aspirin EC 81 MG tablet Take 81 mg by mouth every morning.   atorvastatin 40 MG tablet Commonly known as:  LIPITOR Take 40 mg by mouth daily. For HLD   CALCIUM CARBONATE PO Take 1 tablet by mouth 2 (two) times daily.   colchicine 0.6 MG tablet Take 1 tablet (0.6 mg total)  by mouth daily.   docusate sodium 100 MG capsule Commonly known as:  COLACE Take 100 mg by mouth daily as needed for mild constipation.   esomeprazole 40 MG capsule Commonly known as:  NEXIUM Take 40 mg by mouth daily as needed (for acid reflux).   fluticasone-salmeterol 115-21 MCG/ACT inhaler Commonly known as:  ADVAIR HFA Inhale 2 puffs into the lungs 2 (two) times daily.   furosemide 20 MG tablet Commonly known as:  LASIX Take 1 tablet (20 mg  total) by mouth 2 (two) times daily. What changed:  medication strength  how much to take  how to take this  when to take this  additional instructions   glimepiride 1 MG tablet Commonly known as:  AMARYL Take 1 mg by mouth daily with breakfast.   HYDROcodone-homatropine 5-1.5 MG/5ML syrup Commonly known as:  HYCODAN Take 5 mLs by mouth every 6 (six) hours as needed for cough.   Ipratropium-Albuterol 20-100 MCG/ACT Aers respimat Commonly known as:  COMBIVENT Inhale 1 puff into the lungs every 6 (six) hours as needed for wheezing or shortness of breath.   ipratropium-albuterol 0.5-2.5 (3) MG/3ML Soln Commonly known as:  DUONEB Take 3 mLs by nebulization every 6 (six) hours as needed (for shortness of breath).   levothyroxine 100 MCG tablet Commonly known as:  SYNTHROID, LEVOTHROID Take 100 mcg by mouth daily before breakfast.   multivitamin with minerals Tabs tablet Take 1 tablet by mouth daily.   potassium chloride SA 20 MEQ tablet Commonly known as:  K-DUR,KLOR-CON Take 1 tablet (20 mEq total) by mouth daily.   rOPINIRole 4 MG tablet Commonly known as:  REQUIP Take 4 mg by mouth at bedtime.   TOPROL XL 50 MG 24 hr tablet Generic drug:  metoprolol succinate TAKE 1 TABLET EVERY EVENING (DISCONTINUE CARVEDIOLOL PRESCRIPTION)   vitamin C 500 MG tablet Commonly known as:  ASCORBIC ACID Take 500 mg by mouth daily.   warfarin 1 MG tablet Commonly known as:  COUMADIN Take 1 tablet (1 mg total) by mouth daily at 6 PM.         Follow-up Information    [facility]. Go on 12/18/2015.   Why:  @ 3:15 For Coumadin Clinic Appointment Contact information: [address] [place] [address]       [name], MD. Schedule an appointment as soon as possible for a visit in 2 week(s).   Specialty:  Family Medicine Contact information: [address] [place] [address] [phone]        Eilleen Kempf., MD Follow up today.   Specialty:  Oncology Why:  f/u as scheduled. Contact information: [place] [address] [phone]        [facility] .   Why:  Registered Nurse Contact information: [place] [address] [phone]          Allergies  Allergen Reactions  . Coumadin [Warfarin Sodium] Other (See Comments)    Reaction: Bleeding from ears  . Prozac [Fluoxetine Hcl] Rash    Consultations:  Cardiology   Procedures/Studies: Dg Chest 2 View  Result Date: 12/07/2015 CLINICAL DATA:  Worsening shortness of breath.  Known lung cancer. EXAM: CHEST  2 VIEW COMPARISON:  Chest CT from the same date. FINDINGS: The heart is normal in size and stable. Stable tortuosity and calcification of the thoracic aorta. Extensive tumor, radiation fibrosis and pleural fluid involving the right hemi thorax. The left  lung is relatively clear. Minimal left basilar atelectasis. Stable surgical changes from valve replacement surgery. The bony thorax is grossly intact. IMPRESSION: Extensive radiation changes, tumor and pleural effusion involving the right hemi thorax. Electronically Signed   By: Marijo Sanes M.D.   On: 12/07/2015 18:23   Dg Ankle Complete Right  Result Date: 12/15/2015 CLINICAL DATA:  82 year old female with right ankle pain. No known injury. Initial encounter. EXAM: RIGHT ANKLE - COMPLETE 3+ VIEW COMPARISON:  None. FINDINGS: In general there is normal for age bone mineralization about the right ankle. Mortise joint alignment is preserved. There may be a healed chronic fracture of the distal right fibula as seen on image 2. There is mild to moderate degenerative spurring at the medial and lateral malleolus. The talar dome appears largely intact but there is mild irregularity and subchondral sclerosis along the lateral aspect of the dome (arrow on image 2). Calcaneus is intact with degenerative spurring. Visualized right foot  osseous structures are intact. IMPRESSION: 1. Suggestion of osteochondral defect of the lateral talar dome. Otherwise no acute fracture or dislocation about the right ankle. 2. Probable healed fracture of the distal right fibula. Medial malleolus, lateral malleolus, and calcaneus degenerative spurring. Electronically Signed   By: Genevie Ann M.D.   On: 12/15/2015 16:50   Ct Chest W Contrast  Result Date: 12/07/2015 CLINICAL DATA:  Lung cancer, chemotherapy and radiation therapy complete. Shortness of breath. EXAM: CT CHEST WITH CONTRAST TECHNIQUE: Multidetector CT imaging of the chest was performed during intravenous contrast administration. CONTRAST:  62m ISOVUE-300 IOPAMIDOL (ISOVUE-300) INJECTION 61% COMPARISON:  PET 07/06/2015 and CT chest 06/01/2015. FINDINGS: Cardiovascular: Atherosclerotic calcification of the arterial vasculature. A large low-attenuation defect is seen in the posterior aspect of the left atrium, measuring 2.4 x 5.3 cm, new. Heart is at the upper limits of normal in size. No pericardial effusion. Mediastinum/Nodes: Hazy soft tissue fullness is seen in the low right internal jugular station (series 2, images 10-15), difficult to measure. Finding appears new from 06/01/2015. Low right paratracheal lymph node measures 1.7 cm, possibly stable but somewhat poorly defined on the prior study. Soft tissue is seen in the right hilum without a discrete lymph node. No axillary adenopathy. Pre pericardiac/juxta diaphragmatic lymph nodes are sub cm in short axis size. Lungs/Pleura: Increased masslike consolidation in the right upper lobe, difficult to measure, with an enlarging loculated right pleural effusion. Likely radiation fibrosis in the perihilar right hemi thorax. Possible necrotic subpleural nodule in the anterior segment right upper lobe, measuring 2.6 cm (images 47-48), new. Previously seen hypermetabolic right lower lobe pulmonary nodule is no longer readily identified. Persistent volume loss  in the left lower lobe. Centrilobular emphysema, mild. Upper Abdomen: Visualized portions of the liver, adrenal glands, kidneys, spleen, pancreas, stomach and bowel are grossly unremarkable. No upper abdominal adenopathy. Cholecystectomy. Musculoskeletal: Likely pathologic fracture and associated healing involving the right third lateral rib (series 2, image 34). IMPRESSION: 1. Large left atrial bland versus tumor thrombus, new. Critical Value/emergent results were called by telephone at the time of interpretation on 12/07/2015 at 9:16 am to Dr. MCurt Bears, who verbally acknowledged these results. 2. Increasing masslike consolidation and possible separate new necrotic nodule in the right upper lobe with increasing right loculated pleural effusion and new low right internal jugular ill-defined soft tissue, findings all worrisome for disease progression. 3. Previously seen hypermetabolic right lower lobe nodule is no longer identified. 4. Likely healing pathologic fracture of the lateral right third rib. 5. Aortic  atherosclerosis. Electronically Signed   By: Lorin Picket M.D.   On: 12/07/2015 09:17       Subjective: Patient is feeling better, right ankle pain improved, dyspnea stable associated with cough. Positive generalized weakness, no nausea or vomiting and tolerating po well.    Discharge Exam: Vitals:   12/15/15 2107 12/16/15 0424  BP: (!) 107/49 (!) 120/50  Pulse: 84 60  Resp: (!) 25 (!) 21  Temp: 97.7 F (36.5 C) 97.8 F (36.6 C)   Vitals:   12/15/15 1311 12/15/15 2030 12/15/15 2107 12/16/15 0424  BP: (!) 132/58  (!) 107/49 (!) 120/50  Pulse: 94  84 60  Resp: (!) 26  (!) 25 (!) 21  Temp: 97.9 F (36.6 C)  97.7 F (36.5 C) 97.8 F (36.6 C)  TempSrc: Oral  Oral Oral  SpO2: 98% 99% 97% 100%  Weight:    58.2 kg (128 lb 3.2 oz)  Height:        General: Pt is alert, awake, not in acute distress Cardiovascular: RRR, S1/S2 +, no rubs, no gallops Respiratory: decreased  breath sounds bilaterally with poor air movement, positive scattered rhonchi, no wheezing.  Abdominal: Soft, NT, ND, bowel sounds + Extremities: no edema, no cyanosis    The results of significant diagnostics from this hospitalization (including imaging, microbiology, ancillary and laboratory) are listed below for reference.     Microbiology: No results found for this or any previous visit (from the past 240 hour(s)).   Labs: BNP (last 3 results)  Recent Labs  12/08/15 0503  BNP 732.2*   Basic Metabolic Panel:  Recent Labs Lab 12/10/15 0416 12/15/15 0449 12/16/15 0413  NA 138 138 139  K 4.0 3.0* 4.5  CL 101 100* 99*  CO2 '25 30 27  '$ GLUCOSE 168* 106* 174*  BUN '18 16 19  '$ CREATININE 0.79 0.83 0.79  CALCIUM 8.8* 9.1 9.2   Liver Function Tests: No results for input(s): AST, ALT, ALKPHOS, BILITOT, PROT, ALBUMIN in the last 168 hours. No results for input(s): LIPASE, AMYLASE in the last 168 hours. No results for input(s): AMMONIA in the last 168 hours. CBC:  Recent Labs Lab 12/12/15 0406 12/13/15 0423 12/13/15 1404 12/14/15 0601 12/15/15 0449 12/16/15 0413  WBC 10.8* 12.6*  --  10.6* 10.7* 12.0*  HGB 9.9* 9.9* 10.6* 9.7* 9.3* 9.2*  HCT 33.8* 33.6* 35.3* 31.8* 31.1* 31.9*  MCV 79.5 78.9  --  77.6* 78.3 79.6  PLT 222 238  --  227 234 235   Cardiac Enzymes: No results for input(s): CKTOTAL, CKMB, CKMBINDEX, TROPONINI in the last 168 hours. BNP: Invalid input(s): POCBNP CBG:  Recent Labs Lab 12/15/15 0733 12/15/15 1125 12/15/15 1621 12/15/15 2106 12/16/15 0724  GLUCAP 84 75 90 298* 151*   D-Dimer No results for input(s): DDIMER in the last 72 hours. Hgb A1c No results for input(s): HGBA1C in the last 72 hours. Lipid Profile No results for input(s): CHOL, HDL, LDLCALC, TRIG, CHOLHDL, LDLDIRECT in the last 72 hours. Thyroid function studies No results for input(s): TSH, T4TOTAL, T3FREE, THYROIDAB in the last 72 hours.  Invalid input(s): FREET3 Anemia  work up No results for input(s): VITAMINB12, FOLATE, FERRITIN, TIBC, IRON, RETICCTPCT in the last 72 hours. Urinalysis    Component Value Date/Time   LABSPEC 1.015 01/22/2015 1048   PHURINE 6.0 01/22/2015 1048   GLUCOSEU Negative 01/22/2015 1048   HGBUR Large 01/22/2015 1048   BILIRUBINUR Negative 01/22/2015 1048   KETONESUR Negative 01/22/2015 1048   PROTEINUR 30  01/22/2015 1048   UROBILINOGEN 0.2 01/22/2015 1048   NITRITE Positive 01/22/2015 1048   LEUKOCYTESUR Large 01/22/2015 1048   Sepsis Labs Invalid input(s): PROCALCITONIN,  WBC,  LACTICIDVEN Microbiology No results found for this or any previous visit (from the past 240 hour(s)).   Time coordinating discharge: 45 minutes  SIGNED:   Tawni Millers, MD  [facility] 12/16/2015, 8:44 AM Pager   If 7PM-7AM, please contact night-coverage [email or web address] Password [email or web address]

## 2015-12-16 NOTE — Consult Note (Signed)
   [facility] CM Inpatient Consult   12/16/2015  ALFREDA HAMMAD 18-Dec-1933 [id number]    [facility] Care Management follow up. Spoke with inpatient RNCM who indicates Ms. Sheller will also have [place] RN with [place] and discharge is slated for today. Will update [facility] RNCM.  Marthenia Rolling, MSN-Ed, RN,BSN [facility] Liaison [phone]

## 2015-12-16 NOTE — Progress Notes (Signed)
ANTICOAGULATION CONSULT NOTE - Follow Up Consult  Pharmacy Consult for Heparin/Warfain Indication: atrial fibrillation and LA thrombus  Assessment: 83 yoF with LA thrombus and history of AFib and bioprosthetic AVR/MVR being managed with warfarin. Warfarin was added on 9/20, and heparin has been discontinued with INR at goal. Goal INR is on the low end due to recent nose bleeds and hemoptysis (last noted on 9/24) -INR= 2.38 with trend up (this is with holding coumadin on 9/23)-INR= 2.45  Goal of Therapy:  Goal INR 2-2.5 Monitor platelets by anticoagulation protocol: Yes   Plan:  -Coumadin '1mg'$  po today -Anticipate continued INR trend up. Would consider coumadin '1mg'$  po daily at discharge with an INR check on  Friday     Allergies  Allergen Reactions  . Coumadin [Warfarin Sodium] Other (See Comments)    Reaction: Bleeding from ears  . Prozac [Fluoxetine Hcl] Rash    Patient Measurements: Height: '5\' 3"'$  (160 cm) Weight: 128 lb 3.2 oz (58.2 kg) IBW/kg (Calculated) : 52.4 Heparin Dosing Weight: 59kg  Vital Signs: Temp: 97.8 F (36.6 C) (09/27 0424) Temp Source: Oral (09/27 0424) BP: 120/50 (09/27 0424) Pulse Rate: 60 (09/27 0424)  Labs:  Recent Labs  12/14/15 0601 12/15/15 0449 12/16/15 0413  HGB 9.7* 9.3* 9.2*  HCT 31.8* 31.1* 31.9*  PLT 227 234 235  LABPROT 23.0* 26.4* 27.1*  INR 2.00 2.38 2.45  HEPARINUNFRC <0.10*  --   --   CREATININE  --  0.83 0.79    Estimated Creatinine Clearance: 46.4 mL/min (by C-G formula based on SCr of 0.79 mg/dL).  Medications:  Scheduled:  . aspirin EC  81 mg Oral Daily  . atorvastatin  40 mg Oral q1800  . budesonide (PULMICORT) nebulizer solution  0.25 mg Nebulization BID  . furosemide  20 mg Oral QPM  . furosemide  40 mg Oral Daily  . guaiFENesin  1,200 mg Oral BID  . insulin aspart  0-9 Units Subcutaneous TID WC  . ipratropium-albuterol  3 mL Nebulization BID  . levothyroxine  100 mcg Oral QAC breakfast  . metoprolol  succinate  50 mg Oral Daily  . multivitamin with minerals  1 tablet Oral Daily  . pantoprazole  40 mg Oral Daily  . predniSONE  40 mg Oral QAC breakfast  . rOPINIRole  4 mg Oral QHS  . vitamin C  500 mg Oral Daily  . Warfarin - Pharmacist Dosing Inpatient   Does not apply q1800   Infusions:    Hildred Laser, Pharm D 12/16/2015 8:44 AM

## 2015-12-17 ENCOUNTER — Other Ambulatory Visit: Payer: Self-pay

## 2015-12-17 DIAGNOSIS — J449 Chronic obstructive pulmonary disease, unspecified: Secondary | ICD-10-CM | POA: Diagnosis not present

## 2015-12-17 DIAGNOSIS — C3411 Malignant neoplasm of upper lobe, right bronchus or lung: Secondary | ICD-10-CM | POA: Diagnosis not present

## 2015-12-17 DIAGNOSIS — E119 Type 2 diabetes mellitus without complications: Secondary | ICD-10-CM | POA: Diagnosis not present

## 2015-12-17 DIAGNOSIS — I482 Chronic atrial fibrillation: Secondary | ICD-10-CM | POA: Diagnosis not present

## 2015-12-17 DIAGNOSIS — Z7984 Long term (current) use of oral hypoglycemic drugs: Secondary | ICD-10-CM | POA: Diagnosis not present

## 2015-12-17 DIAGNOSIS — I5032 Chronic diastolic (congestive) heart failure: Secondary | ICD-10-CM | POA: Diagnosis not present

## 2015-12-17 DIAGNOSIS — Z951 Presence of aortocoronary bypass graft: Secondary | ICD-10-CM | POA: Diagnosis not present

## 2015-12-17 DIAGNOSIS — Z7951 Long term (current) use of inhaled steroids: Secondary | ICD-10-CM | POA: Diagnosis not present

## 2015-12-17 DIAGNOSIS — I110 Hypertensive heart disease with heart failure: Secondary | ICD-10-CM | POA: Diagnosis not present

## 2015-12-17 DIAGNOSIS — I2510 Atherosclerotic heart disease of native coronary artery without angina pectoris: Secondary | ICD-10-CM | POA: Diagnosis not present

## 2015-12-17 DIAGNOSIS — I513 Intracardiac thrombosis, not elsewhere classified: Secondary | ICD-10-CM | POA: Diagnosis not present

## 2015-12-17 DIAGNOSIS — Z7901 Long term (current) use of anticoagulants: Secondary | ICD-10-CM | POA: Diagnosis not present

## 2015-12-17 DIAGNOSIS — Z952 Presence of prosthetic heart valve: Secondary | ICD-10-CM | POA: Diagnosis not present

## 2015-12-17 NOTE — Patient Outreach (Signed)
Transition of care call: Admitted on 12/06/2015 Discharged on 12/14/2015  New Referral: Reviewed EMR. Placed call to daughter who is point of contact and on consent form.  Daughter reports patient is doing well. Reports no problems since discharge yesterday. States that home health nurse is coming out to see patient today.  Daughter reports knowledge of follow up at the Coumadin Clinic tomorrow. I confirmed appointment time for daughter as 1515 per EPIC.  Also confirmed follow appointment with Dr. Julien Nordmann on 12/21/2015. Daughter reports that she will call primary MD and make an appointment today.  When attempting to review discharge medications, daughter reports someone else has already call and done this and she has all of patients medications.   Daughters denies any question or concerns today. Provided my contact information and review of transition of care program with weekly outreach phone calls or visits.    PLAN: Will continue to outreach patient weekly. Will send barrier letter to Md. [facility] CM Care Plan Problem One   Flowsheet Row Most Recent Value  Care Plan Problem One  Recent hospital admission related to atrial thrombus  Role Documenting the Problem One  Care Management [place] for Problem One  Active  [facility] Long Term Goal (31-90 days)  Patient will report no readmission in the next 31 days.  [facility] Long Term Goal Start Date  12/17/15  Interventions for Problem One Long Term Goal  Reviewed discharge instructions. Provided my contact information, confirmed MD appointments.  [facility] CM Short Term Goal #1 (0-30 days)  Patient will report seeing primary Md in the next 2 weeks.  [facility] CM Short Term Goal #1 Start Date  12/17/15  Interventions for Short Term Goal #1  reviewed importance of follow up with Md. Encouraged daughter to call and make an appointment.  [facility] CM Short Term Goal #2 (0-30 days)  Patient will report taking all medications as prescribed in the next 30 days.  [facility] CM  Short Term Goal #2 Start Date  12/17/15  Interventions for Short Term Goal #2  encouraged daughter to call for any problems with medications.  Will continue weekly calls to patient and or daughter.      Tomasa Rand, RN, BSN, CEN [facility] [employer] [phone]

## 2015-12-18 ENCOUNTER — Ambulatory Visit (INDEPENDENT_AMBULATORY_CARE_PROVIDER_SITE_OTHER): Payer: Medicare Other | Admitting: *Deleted

## 2015-12-18 DIAGNOSIS — I482 Chronic atrial fibrillation, unspecified: Secondary | ICD-10-CM

## 2015-12-18 DIAGNOSIS — I5032 Chronic diastolic (congestive) heart failure: Secondary | ICD-10-CM | POA: Diagnosis not present

## 2015-12-18 DIAGNOSIS — I513 Intracardiac thrombosis, not elsewhere classified: Secondary | ICD-10-CM | POA: Diagnosis not present

## 2015-12-18 DIAGNOSIS — Z952 Presence of prosthetic heart valve: Secondary | ICD-10-CM

## 2015-12-18 DIAGNOSIS — Z954 Presence of other heart-valve replacement: Secondary | ICD-10-CM

## 2015-12-18 DIAGNOSIS — Z5181 Encounter for therapeutic drug level monitoring: Secondary | ICD-10-CM | POA: Diagnosis not present

## 2015-12-18 DIAGNOSIS — J449 Chronic obstructive pulmonary disease, unspecified: Secondary | ICD-10-CM | POA: Diagnosis not present

## 2015-12-18 DIAGNOSIS — E119 Type 2 diabetes mellitus without complications: Secondary | ICD-10-CM | POA: Diagnosis not present

## 2015-12-18 DIAGNOSIS — C3411 Malignant neoplasm of upper lobe, right bronchus or lung: Secondary | ICD-10-CM | POA: Diagnosis not present

## 2015-12-18 DIAGNOSIS — I110 Hypertensive heart disease with heart failure: Secondary | ICD-10-CM | POA: Diagnosis not present

## 2015-12-18 LAB — POCT INR: INR: 2.5

## 2015-12-18 NOTE — Patient Instructions (Signed)

## 2015-12-21 ENCOUNTER — Telehealth: Payer: Self-pay | Admitting: *Deleted

## 2015-12-21 ENCOUNTER — Telehealth: Payer: Self-pay | Admitting: Internal Medicine

## 2015-12-21 ENCOUNTER — Ambulatory Visit (HOSPITAL_BASED_OUTPATIENT_CLINIC_OR_DEPARTMENT_OTHER): Payer: Medicare Other | Admitting: Internal Medicine

## 2015-12-21 ENCOUNTER — Encounter: Payer: Self-pay | Admitting: Internal Medicine

## 2015-12-21 VITALS — BP 122/48 | HR 82 | Temp 98.6°F | Resp 16 | Ht 63.0 in | Wt 128.8 lb

## 2015-12-21 DIAGNOSIS — I219 Acute myocardial infarction, unspecified: Secondary | ICD-10-CM | POA: Diagnosis not present

## 2015-12-21 DIAGNOSIS — C3411 Malignant neoplasm of upper lobe, right bronchus or lung: Secondary | ICD-10-CM

## 2015-12-21 DIAGNOSIS — C3431 Malignant neoplasm of lower lobe, right bronchus or lung: Secondary | ICD-10-CM

## 2015-12-21 DIAGNOSIS — R05 Cough: Secondary | ICD-10-CM

## 2015-12-21 DIAGNOSIS — Z5112 Encounter for antineoplastic immunotherapy: Secondary | ICD-10-CM

## 2015-12-21 HISTORY — DX: Encounter for antineoplastic immunotherapy: Z51.12

## 2015-12-21 NOTE — Progress Notes (Signed)
START ON PATHWAY REGIMEN - Non-Small Cell Lung  Other Clinical Trial: Nivolumab  Patient Characteristics: Local Recurrence AJCC M Stage: 0 AJCC N Stage: 2 AJCC T Stage: 2a Current Disease Status: Local Recurrence AJCC Stage Grouping: IIIA  Intent of Therapy: Non-Curative / Palliative Intent, Discussed with Patient

## 2015-12-21 NOTE — Telephone Encounter (Signed)
Message sent to infusion scheduler to be added.  Avs report and appointment schedule given to patient, per 12/21/15 los.

## 2015-12-21 NOTE — Progress Notes (Signed)
[place] [phone]   [phone]  OFFICE PROGRESS NOTE  [name], MD [address] [place] [address]  DIAGNOSIS: Progressive non-small cell lung cancer initially diagnosed as Stage IIIA (T2a, N2, M0) non-small cell lung cancer, squamous cell carcinoma presented with recurrence on the right upper lobe in addition to mediastinal lymphadenopathy diagnosed in September 2016.  PRIOR THERAPY: Concurrent chemoradiation with weekly carboplatin for AUC of 2 and paclitaxel 45 MG/M2. S/P 6 cycles.  CURRENT THERAPY: Immunotherapy with Nivolumab 240 MG IV every 2 weeks. First dose 12/29/2015.  INTERVAL HISTORY: Marissa [name] 82 y.o. female returns to the clinic today for follow-up visit accompanied by her daughter. She has been on observation for the last 9 months. Unfortunately recent CT scan of the chest showed evidence for disease progression in addition to suspicious thrombus in the left atrium. She was admitted to [facility] and started on anticoagulation and currently on Coumadin followed by the Coumadin clinic at the cardiology office. She continues to have mild cough productive of blood-tinged sputum. She denied having any significant nausea or vomiting. She has no fever or chills. She denied having any significant weight loss or night sweats. She is here today for evaluation and discussion of her treatment options.  MEDICAL HISTORY: Past Medical History:  Diagnosis Date  . A-fib ([place])   . Aortic stenosis    (s/p AVR w/ 21 mm porcine valve 03/2011  . Aortic valve disorders   . Arthritis   . CAD (coronary artery disease)    s/p CABG 03/2011  . CHF (congestive heart failure) ([place])   . COPD (chronic obstructive pulmonary disease) ([place])   . Diabetes mellitus without complication ([place])   . Dysrhythmia   . Full code status 12/22/2014  . Full code status 12/22/2014  . GERD (gastroesophageal reflux disease)    takes  nexium prn  . History of blood transfusion    Rheumatic Fever  . HTN (hypertension)   . Hypercholesteremia   . Hypothyroidism   . Lung cancer ([place])   . Mastoiditis   . Mitral valve disease    s/p MVR 03/2011 w/ 27 mm porcine valve  . Myocardial infarction    "slight"  . Osteopenia   . Pneumonia   . Pre-diabetes   . RLS (restless legs syndrome)   . Shortness of breath    with exertion    ALLERGIES:  is allergic to coumadin [warfarin sodium] and prozac [fluoxetine hcl].  MEDICATIONS:  Current Outpatient Prescriptions  Medication Sig Dispense Refill  . acetaminophen (TYLENOL) 325 MG tablet Take 2 tablets (650 mg total) by mouth every 6 (six) hours as needed for mild pain (or Fever >/= 101).    [name] alendronate (FOSAMAX) 70 MG tablet Take 70 mg by mouth once a week.     [name] aspirin EC 81 MG tablet Take 81 mg by mouth every morning.    [name] atorvastatin (LIPITOR) 40 MG tablet Take 40 mg by mouth daily. For HLD    . CALCIUM CARBONATE PO Take 1 tablet by mouth 2 (two) times daily.    . colchicine 0.6 MG tablet Take 1 tablet (0.6 mg total) by mouth daily. 5 tablet 0  . esomeprazole (NEXIUM) 40 MG capsule Take 40 mg by mouth daily as needed (for acid reflux).     . fluticasone-salmeterol (ADVAIR HFA) 115-21 MCG/ACT inhaler Inhale 2 puffs into the lungs 2 (two) times daily.     [name]  furosemide (LASIX) 20 MG tablet Take 1 tablet (20 mg total) by mouth 2 (two) times daily. 30 tablet 0  . glimepiride (AMARYL) 1 MG tablet Take 1 mg by mouth daily with breakfast.    . Ipratropium-Albuterol (COMBIVENT) 20-100 MCG/ACT AERS respimat Inhale 1 puff into the lungs every 6 (six) hours as needed for wheezing or shortness of breath.     [name] ipratropium-albuterol (DUONEB) 0.5-2.5 (3) MG/3ML SOLN Take 3 mLs by nebulization every 6 (six) hours as needed (for shortness of breath).     [name] levothyroxine (SYNTHROID, LEVOTHROID) 100 MCG tablet Take 100 mcg by mouth daily before breakfast.     . Multiple Vitamin (MULTIVITAMIN  WITH MINERALS) TABS tablet Take 1 tablet by mouth daily.    . potassium chloride SA (K-DUR,KLOR-CON) 20 MEQ tablet Take 1 tablet (20 mEq total) by mouth daily. 7 tablet 0  . rOPINIRole (REQUIP) 4 MG tablet Take 4 mg by mouth at bedtime.    . TOPROL XL 50 MG 24 hr tablet TAKE 1 TABLET EVERY EVENING (DISCONTINUE CARVEDIOLOL PRESCRIPTION) 90 tablet 3  . vitamin C (ASCORBIC ACID) 500 MG tablet Take 500 mg by mouth daily.    [name] warfarin (COUMADIN) 1 MG tablet Take 1 tablet (1 mg total) by mouth daily at 6 PM. 30 tablet 0  . HYDROcodone-homatropine (HYCODAN) 5-1.5 MG/5ML syrup Take 5 mLs by mouth every 6 (six) hours as needed for cough. (Patient not taking: Reported on 12/21/2015) 120 mL 0   No current facility-administered medications for this visit.     SURGICAL HISTORY:  Past Surgical History:  Procedure Laterality Date  . AORTIC VALVE REPLACEMENT    . COMPRESSION HIP SCREW Left 12/26/2014   Procedure: ORIF LEFT HIP INTERTROCHANTRIC FRACTURE;  Surgeon: Frederik Pear, MD;  Location: [place];  Service: Orthopedics;  Laterality: Left;  . CORONARY ARTERY BYPASS GRAFT  03/2011   1990's also  . LAPAROSCOPIC CHOLECYSTECTOMY    . MITRAL VALVE REPLACEMENT     1990's  . TONSILLECTOMY    . VIDEO BRONCHOSCOPY WITH ENDOBRONCHIAL NAVIGATION N/A 01/06/2014   Procedure: VIDEO BRONCHOSCOPY WITH ENDOBRONCHIAL NAVIGATION;  Surgeon: [name], MD;  Location: [place];  Service: Thoracic;  Laterality: N/A;  . VIDEO BRONCHOSCOPY WITH ENDOBRONCHIAL NAVIGATION N/A 11/21/2014   Procedure: VIDEO BRONCHOSCOPY WITH ENDOBRONCHIAL NAVIGATION;  Surgeon: [name], MD;  Location: [place];  Service: Thoracic;  Laterality: N/A;  . VIDEO BRONCHOSCOPY WITH ENDOBRONCHIAL ULTRASOUND N/A 01/06/2014   Procedure: VIDEO BRONCHOSCOPY WITH ENDOBRONCHIAL ULTRASOUND;  Surgeon: [name], MD;  Location: [place];  Service: Thoracic;  Laterality: N/A;  . VIDEO BRONCHOSCOPY WITH ENDOBRONCHIAL ULTRASOUND N/A 11/21/2014    Procedure: VIDEO BRONCHOSCOPY WITH ENDOBRONCHIAL ULTRASOUND;  Surgeon: [name], MD;  Location: [place];  Service: Thoracic;  Laterality: N/A;    REVIEW OF SYSTEMS:  Constitutional: positive for fatigue Eyes: negative Ears, nose, mouth, throat, and face: negative Respiratory: positive for cough, dyspnea on exertion and sputum Cardiovascular: negative Gastrointestinal: negative Genitourinary:negative Integument/breast: negative Hematologic/lymphatic: negative Musculoskeletal:negative Neurological: negative Behavioral/Psych: negative Endocrine: negative Allergic/Immunologic: negative   PHYSICAL EXAMINATION: General appearance: alert, cooperative, fatigued and no distress Head: Normocephalic, without obvious abnormality, atraumatic Neck: no adenopathy, no JVD, supple, symmetrical, trachea midline and thyroid not enlarged, symmetric, no tenderness/mass/nodules Lymph nodes: Cervical, supraclavicular, and axillary nodes normal. Resp: clear to auscultation bilaterally Back: symmetric, no curvature. ROM normal. No CVA tenderness. Cardio: regular rate and rhythm, S1, S2 normal, no murmur, click, rub or gallop GI: soft, non-tender;  bowel sounds normal; no masses,  no organomegaly Extremities: extremities normal, atraumatic, no cyanosis or edema Neurologic: Alert and oriented X 3, normal strength and tone. Normal symmetric reflexes. Normal coordination and gait  ECOG PERFORMANCE STATUS: 1 - Symptomatic but completely ambulatory  Blood pressure (!) 122/48, pulse 82, temperature 98.6 F (37 C), temperature source Oral, resp. rate 16, height '5\' 3"'$  (1.6 m), weight 128 lb 12.8 oz (58.4 kg), SpO2 98 %.  LABORATORY DATA: Lab Results  Component Value Date   WBC 12.0 (H) 12/16/2015   HGB 9.2 (L) 12/16/2015   HCT 31.9 (L) 12/16/2015   MCV 79.6 12/16/2015   PLT 235 12/16/2015      Chemistry      Component Value Date/Time   NA 139 12/16/2015 0413   NA 145 12/07/2015 0740   K 4.5  12/16/2015 0413   K 3.5 12/07/2015 0740   CL 99 (L) 12/16/2015 0413   CL 98 04/17/2014 0409   CO2 27 12/16/2015 0413   CO2 28 12/07/2015 0740   BUN 19 12/16/2015 0413   BUN 23.1 12/07/2015 0740   CREATININE 0.79 12/16/2015 0413   CREATININE 0.8 12/07/2015 0740      Component Value Date/Time   CALCIUM 9.2 12/16/2015 0413   CALCIUM 9.3 12/07/2015 0740   ALKPHOS 95 12/08/2015 0503   ALKPHOS 127 12/07/2015 0740   AST 26 12/08/2015 0503   AST 19 12/07/2015 0740   ALT 19 12/08/2015 0503   ALT 19 12/07/2015 0740   BILITOT 0.6 12/08/2015 0503   BILITOT 0.62 12/07/2015 0740       RADIOGRAPHIC STUDIES: Dg Chest 2 View  Result Date: 12/07/2015 CLINICAL DATA:  Worsening shortness of breath.  Known lung cancer. EXAM: CHEST  2 VIEW COMPARISON:  Chest CT from the same date. FINDINGS: The heart is normal in size and stable. Stable tortuosity and calcification of the thoracic aorta. Extensive tumor, radiation fibrosis and pleural fluid involving the right hemi thorax. The left lung is relatively clear. Minimal left basilar atelectasis. Stable surgical changes from valve replacement surgery. The bony thorax is grossly intact. IMPRESSION: Extensive radiation changes, tumor and pleural effusion involving the right hemi thorax. Electronically Signed   By: Marijo Sanes M.D.   On: 12/07/2015 18:23   Dg Ankle Complete Right  Result Date: 12/15/2015 CLINICAL DATA:  82 year old female with right ankle pain. No known injury. Initial encounter. EXAM: RIGHT ANKLE - COMPLETE 3+ VIEW COMPARISON:  None. FINDINGS: In general there is normal for age bone mineralization about the right ankle. [place] joint alignment is preserved. There may be a healed chronic fracture of the distal right fibula as seen on image 2. There is mild to moderate degenerative spurring at the medial and lateral malleolus. The talar dome appears largely intact but there is mild irregularity and subchondral sclerosis along the lateral aspect  of the dome (arrow on image 2). Calcaneus is intact with degenerative spurring. Visualized right foot osseous structures are intact. IMPRESSION: 1. Suggestion of osteochondral defect of the lateral talar dome. Otherwise no acute fracture or dislocation about the right ankle. 2. Probable healed fracture of the distal right fibula. Medial malleolus, lateral malleolus, and calcaneus degenerative spurring. Electronically Signed   By: Genevie Ann M.D.   On: 12/15/2015 16:50   Ct Chest W Contrast  Result Date: 12/07/2015 CLINICAL DATA:  Lung cancer, chemotherapy and radiation therapy complete. Shortness of breath. EXAM: CT CHEST WITH CONTRAST TECHNIQUE: Multidetector CT imaging of the chest was performed during  intravenous contrast administration. CONTRAST:  97m ISOVUE-300 IOPAMIDOL (ISOVUE-300) INJECTION 61% COMPARISON:  PET 07/06/2015 and CT chest 06/01/2015. FINDINGS: Cardiovascular: Atherosclerotic calcification of the arterial vasculature. A large low-attenuation defect is seen in the posterior aspect of the left atrium, measuring 2.4 x 5.3 cm, new. Heart is at the upper limits of normal in size. No pericardial effusion. Mediastinum/Nodes: Hazy soft tissue fullness is seen in the low right internal jugular station (series 2, images 10-15), difficult to measure. Finding appears new from 06/01/2015. Low right paratracheal lymph node measures 1.7 cm, possibly stable but somewhat poorly defined on the prior study. Soft tissue is seen in the right hilum without a discrete lymph node. No axillary adenopathy. Pre pericardiac/juxta diaphragmatic lymph nodes are sub cm in short axis size. Lungs/Pleura: Increased masslike consolidation in the right upper lobe, difficult to measure, with an enlarging loculated right pleural effusion. Likely radiation fibrosis in the perihilar right hemi thorax. Possible necrotic subpleural nodule in the anterior segment right upper lobe, measuring 2.6 cm (images 47-48), new. Previously seen  hypermetabolic right lower lobe pulmonary nodule is no longer readily identified. Persistent volume loss in the left lower lobe. Centrilobular emphysema, mild. Upper Abdomen: Visualized portions of the liver, adrenal glands, kidneys, spleen, pancreas, stomach and bowel are grossly unremarkable. No upper abdominal adenopathy. Cholecystectomy. Musculoskeletal: Likely pathologic fracture and associated healing involving the right third lateral rib (series 2, image 34). IMPRESSION: 1. Large left atrial bland versus tumor thrombus, new. Critical Value/emergent results were called by telephone at the time of interpretation on 12/07/2015 at 9:16 am to Dr. MCurt Bears, who verbally acknowledged these results. 2. Increasing masslike consolidation and possible separate new necrotic nodule in the right upper lobe with increasing right loculated pleural effusion and new low right internal jugular ill-defined soft tissue, findings all worrisome for disease progression. 3. Previously seen hypermetabolic right lower lobe nodule is no longer identified. 4. Likely healing pathologic fracture of the lateral right third rib. 5. Aortic atherosclerosis. Electronically Signed   By: MLorin PicketM.D.   On: 12/07/2015 09:17    ASSESSMENT AND PLAN: This is a very pleasant 82years old white female with recurrent non-small cell lung cancer presented as a stage IIIA, squamous cell carcinoma. The patient completed a course of concurrent chemoradiation with weekly carboplatin and paclitaxel. She status post 6 cycles.  She was on observation for the last 9 months but unfortunately the recent CT scan of the chest showed evidence for disease progression. I discussed the scan results with the patient and her daughter. I gave her the option of palliative care and hospice referral versus consideration of treatment with systemic chemotherapy with carboplatin and gemcitabine versus consideration of treatment with second line immunotherapy  with Nivolumab 240 MG IV every 2 weeks. The patient and her daughter aren't interested in treatment with Nivolumab. She is expected to start the first dose of this treatment on 12/28/2015. I discussed with the patient her daughter the adverse effect of this treatment including but not limited to immune mediated skin rash, diarrhea, inflammation of the lung, liver, kidney as well as thyroid and other endocrine dysfunction. They would like to proceed with treatment as planned. For the left atrium thrombus, she will continue on Coumadin as prescribed by her cardiologist. The patient would come back for follow-up visit in 3 weeks with the start of cycle #2 of her immunotherapy. The patient was advised to call immediately if she has any concerning symptoms in the interval. The patient  voices understanding of current disease status and treatment options and is in agreement with the current care plan.  All questions were answered. The patient knows to call the clinic with any problems, questions or concerns. We can certainly see the patient much sooner if necessary.  Disclaimer: This note was dictated with voice recognition software. Similar sounding words can inadvertently be transcribed and may not be corrected upon review.

## 2015-12-21 NOTE — Telephone Encounter (Signed)
Per LOS I have scheduled appts and notified the scheduler 

## 2015-12-22 DIAGNOSIS — I5032 Chronic diastolic (congestive) heart failure: Secondary | ICD-10-CM | POA: Diagnosis not present

## 2015-12-22 DIAGNOSIS — E119 Type 2 diabetes mellitus without complications: Secondary | ICD-10-CM | POA: Diagnosis not present

## 2015-12-22 DIAGNOSIS — C3411 Malignant neoplasm of upper lobe, right bronchus or lung: Secondary | ICD-10-CM | POA: Diagnosis not present

## 2015-12-22 DIAGNOSIS — I110 Hypertensive heart disease with heart failure: Secondary | ICD-10-CM | POA: Diagnosis not present

## 2015-12-22 DIAGNOSIS — I513 Intracardiac thrombosis, not elsewhere classified: Secondary | ICD-10-CM | POA: Diagnosis not present

## 2015-12-22 DIAGNOSIS — J449 Chronic obstructive pulmonary disease, unspecified: Secondary | ICD-10-CM | POA: Diagnosis not present

## 2015-12-24 ENCOUNTER — Ambulatory Visit (INDEPENDENT_AMBULATORY_CARE_PROVIDER_SITE_OTHER): Payer: Medicare Other | Admitting: *Deleted

## 2015-12-24 ENCOUNTER — Other Ambulatory Visit: Payer: Self-pay

## 2015-12-24 DIAGNOSIS — Z952 Presence of prosthetic heart valve: Secondary | ICD-10-CM | POA: Diagnosis not present

## 2015-12-24 DIAGNOSIS — I482 Chronic atrial fibrillation, unspecified: Secondary | ICD-10-CM

## 2015-12-24 DIAGNOSIS — I513 Intracardiac thrombosis, not elsewhere classified: Secondary | ICD-10-CM | POA: Diagnosis not present

## 2015-12-24 DIAGNOSIS — Z5181 Encounter for therapeutic drug level monitoring: Secondary | ICD-10-CM

## 2015-12-24 DIAGNOSIS — I110 Hypertensive heart disease with heart failure: Secondary | ICD-10-CM | POA: Diagnosis not present

## 2015-12-24 DIAGNOSIS — C3411 Malignant neoplasm of upper lobe, right bronchus or lung: Secondary | ICD-10-CM | POA: Diagnosis not present

## 2015-12-24 DIAGNOSIS — I5032 Chronic diastolic (congestive) heart failure: Secondary | ICD-10-CM | POA: Diagnosis not present

## 2015-12-24 DIAGNOSIS — J449 Chronic obstructive pulmonary disease, unspecified: Secondary | ICD-10-CM | POA: Diagnosis not present

## 2015-12-24 DIAGNOSIS — E119 Type 2 diabetes mellitus without complications: Secondary | ICD-10-CM | POA: Diagnosis not present

## 2015-12-24 LAB — POCT INR: INR: 2.1

## 2015-12-24 MED ORDER — WARFARIN SODIUM 1 MG PO TABS: ORAL_TABLET | ORAL | 0 refills | Status: DC

## 2015-12-24 NOTE — Patient Outreach (Signed)
Transition of care call: Placed call to patient for transition of care. No answer. No machine.  PLAN: Will continue to outreach patient.  Tomasa Rand, RN, BSN, CEN [facility] [employer] [phone]

## 2015-12-25 DIAGNOSIS — I513 Intracardiac thrombosis, not elsewhere classified: Secondary | ICD-10-CM | POA: Diagnosis not present

## 2015-12-25 DIAGNOSIS — J449 Chronic obstructive pulmonary disease, unspecified: Secondary | ICD-10-CM | POA: Diagnosis not present

## 2015-12-25 DIAGNOSIS — C3411 Malignant neoplasm of upper lobe, right bronchus or lung: Secondary | ICD-10-CM | POA: Diagnosis not present

## 2015-12-25 DIAGNOSIS — E119 Type 2 diabetes mellitus without complications: Secondary | ICD-10-CM | POA: Diagnosis not present

## 2015-12-25 DIAGNOSIS — I5032 Chronic diastolic (congestive) heart failure: Secondary | ICD-10-CM | POA: Diagnosis not present

## 2015-12-25 DIAGNOSIS — I110 Hypertensive heart disease with heart failure: Secondary | ICD-10-CM | POA: Diagnosis not present

## 2015-12-28 ENCOUNTER — Other Ambulatory Visit: Payer: Self-pay

## 2015-12-28 ENCOUNTER — Other Ambulatory Visit: Payer: Self-pay | Admitting: Medical Oncology

## 2015-12-28 DIAGNOSIS — C3431 Malignant neoplasm of lower lobe, right bronchus or lung: Secondary | ICD-10-CM

## 2015-12-28 DIAGNOSIS — C3411 Malignant neoplasm of upper lobe, right bronchus or lung: Secondary | ICD-10-CM

## 2015-12-28 NOTE — Patient Outreach (Signed)
Transition of care: Placed call to patient for transition of care. Daughter Jeral Pinch answered. States that her mother is doing fine. Reports that she has home health nurses and does not need anymore phone calls. Explained [facility] transition of care program. Daughter states to remove patient from program.  Reviewed that this is a free benefit with her insurance and again daughter states we do not need your program.  PLAN: Will close case. Will notify MD. Will send case closure to patient and care management assistant.  Tomasa Rand, RN, BSN, CEN [facility] [employer] [phone]

## 2015-12-29 ENCOUNTER — Ambulatory Visit (HOSPITAL_BASED_OUTPATIENT_CLINIC_OR_DEPARTMENT_OTHER): Payer: Medicare Other

## 2015-12-29 ENCOUNTER — Other Ambulatory Visit: Payer: Self-pay | Admitting: Medical Oncology

## 2015-12-29 ENCOUNTER — Ambulatory Visit
Admission: RE | Admit: 2015-12-29 | Discharge: 2015-12-29 | Disposition: A | Discharge status: Home / Self Care | Payer: Medicare Other | Source: Ambulatory Visit | Attending: Radiation Oncology | Admitting: Radiation Oncology

## 2015-12-29 ENCOUNTER — Other Ambulatory Visit (HOSPITAL_BASED_OUTPATIENT_CLINIC_OR_DEPARTMENT_OTHER): Payer: Medicare Other

## 2015-12-29 ENCOUNTER — Encounter: Payer: Self-pay | Admitting: Radiation Oncology

## 2015-12-29 VITALS — BP 137/51 | HR 74 | Temp 98.3°F | Resp 18

## 2015-12-29 VITALS — BP 112/52 | HR 86 | Temp 98.4°F | Resp 16 | Ht 63.0 in | Wt 127.2 lb

## 2015-12-29 DIAGNOSIS — Z7982 Long term (current) use of aspirin: Secondary | ICD-10-CM | POA: Insufficient documentation

## 2015-12-29 DIAGNOSIS — Z5112 Encounter for antineoplastic immunotherapy: Secondary | ICD-10-CM

## 2015-12-29 DIAGNOSIS — C3411 Malignant neoplasm of upper lobe, right bronchus or lung: Secondary | ICD-10-CM | POA: Insufficient documentation

## 2015-12-29 DIAGNOSIS — Z87891 Personal history of nicotine dependence: Secondary | ICD-10-CM | POA: Insufficient documentation

## 2015-12-29 DIAGNOSIS — C3431 Malignant neoplasm of lower lobe, right bronchus or lung: Secondary | ICD-10-CM

## 2015-12-29 DIAGNOSIS — E039 Hypothyroidism, unspecified: Secondary | ICD-10-CM

## 2015-12-29 DIAGNOSIS — Z79899 Other long term (current) drug therapy: Secondary | ICD-10-CM | POA: Insufficient documentation

## 2015-12-29 DIAGNOSIS — Z7901 Long term (current) use of anticoagulants: Secondary | ICD-10-CM | POA: Insufficient documentation

## 2015-12-29 DIAGNOSIS — Z5181 Encounter for therapeutic drug level monitoring: Secondary | ICD-10-CM

## 2015-12-29 LAB — CBC WITH DIFFERENTIAL/PLATELET
BASO%: 0 % (ref 0.0–2.0)
BASOS ABS: 0 10*3/uL (ref 0.0–0.1)
EOS ABS: 0.2 10*3/uL (ref 0.0–0.5)
EOS%: 1.5 % (ref 0.0–7.0)
HEMATOCRIT: 31.8 % — AB (ref 34.8–46.6)
HEMOGLOBIN: 9.6 g/dL — AB (ref 11.6–15.9)
LYMPH#: 0.7 10*3/uL — AB (ref 0.9–3.3)
LYMPH%: 7 % — ABNORMAL LOW (ref 14.0–49.7)
MCH: 23.4 pg — AB (ref 25.1–34.0)
MCHC: 30.2 g/dL — ABNORMAL LOW (ref 31.5–36.0)
MCV: 77.6 fL — AB (ref 79.5–101.0)
MONO#: 0.8 10*3/uL (ref 0.1–0.9)
MONO%: 7.4 % (ref 0.0–14.0)
NEUT#: 8.5 10*3/uL — ABNORMAL HIGH (ref 1.5–6.5)
NEUT%: 84.1 % — ABNORMAL HIGH (ref 38.4–76.8)
PLATELETS: 244 10*3/uL (ref 145–400)
RBC: 4.1 10*6/uL (ref 3.70–5.45)
RDW: 17.3 % — AB (ref 11.2–14.5)
WBC: 10.1 10*3/uL (ref 3.9–10.3)

## 2015-12-29 LAB — COMPREHENSIVE METABOLIC PANEL
ALBUMIN: 3.1 g/dL — AB (ref 3.5–5.0)
ALK PHOS: 117 U/L (ref 40–150)
ALT: 12 U/L (ref 0–55)
ANION GAP: 11 meq/L (ref 3–11)
AST: 17 U/L (ref 5–34)
BILIRUBIN TOTAL: 0.69 mg/dL (ref 0.20–1.20)
BUN: 17.5 mg/dL (ref 7.0–26.0)
CALCIUM: 9.2 mg/dL (ref 8.4–10.4)
CO2: 30 mEq/L — ABNORMAL HIGH (ref 22–29)
Chloride: 102 mEq/L (ref 98–109)
Creatinine: 0.8 mg/dL (ref 0.6–1.1)
EGFR: 69 mL/min/{1.73_m2} — AB (ref >90)
Glucose: 67 mg/dl — ABNORMAL LOW (ref 70–140)
POTASSIUM: 3.4 meq/L — AB (ref 3.5–5.1)
Sodium: 143 mEq/L (ref 136–145)
TOTAL PROTEIN: 7.1 g/dL (ref 6.4–8.3)

## 2015-12-29 MED ORDER — SODIUM CHLORIDE 0.9 % IV SOLN
Freq: Once | INTRAVENOUS | Status: AC
  Administered 2015-12-29: 13:00:00 via INTRAVENOUS

## 2015-12-29 MED ORDER — SODIUM CHLORIDE 0.9 % IV SOLN
240.0000 mg | Freq: Once | INTRAVENOUS | Status: AC
  Administered 2015-12-29: 240 mg via INTRAVENOUS
  Filled 2015-12-29: qty 4

## 2015-12-29 NOTE — Progress Notes (Signed)
Radiation Oncology         [phone] ________________________________  Name: Marissa [name] MRN: [id number]  Date: 12/29/2015  DOB: 13-Jan-1934  Post Treatment Note  CC: [name], MD  [name], *  Diagnosis:  82 y.o. 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  02/10/14-02/17/14: SBRT to RUL target  Narrative:  The patient returns today for routine follow-up after missing her last appointment. She reports that since radiation, she has done well. She denies any concerns with her breathing at this point. A CT in September 2017 revealed increased mass like consolidation in tehr ight uppe rlobe, and enlarging right pleural effusion. A new right upper lobe nodule was also noted and measured 2.6 cm. The previously treated RLL nodule is not visualized.  She is going to continue palliative systemic therapy with Dr. Julien Nordmann and is going to be receiving Nivolumab.                         On review of systems, the patient reports that she is doing well overall. She has been working with pulmonary rehab and has been able to increase her stamina such that she can walk further now that she is using her incentive spirometer. She is on oxygen at 3 L now, an dis hoping to decrease this in the future. She complains of chronic hair loss, and reports she was seen for TSH levels in the last month and no medication changes were necessary. She denies any chest pain, shortness of breath, cough, fevers, chills, night sweats, unintended weight changes. She denies any bowel or bladder disturbances, and denies abdominal pain, nausea or vomiting. She denies any new musculoskeletal  or joint aches or pains. A complete review of systems is obtained and is otherwise negative.   Past Medical History:  Past Medical History:  Diagnosis Date  . A-fib ([place])   . Aortic stenosis    (s/p AVR w/ 21 mm porcine valve 03/2011  . Aortic valve disorders   . Arthritis   . CAD (coronary artery disease)    s/p CABG 03/2011  . CHF (congestive heart failure) ([place])   . COPD (chronic obstructive pulmonary disease) ([place])   . Diabetes mellitus without complication ([place])   . Dysrhythmia   . Encounter for antineoplastic immunotherapy 12/21/2015  . Full code status 12/22/2014  . Full code status 12/22/2014  . GERD (gastroesophageal reflux disease)    takes nexium prn  . History of blood transfusion    Rheumatic Fever  . HTN (hypertension)   . Hypercholesteremia   . Hypothyroidism   . Lung cancer ([place])   . Mastoiditis   . Mitral valve disease    s/p MVR 03/2011 w/ 27 mm porcine valve  . Myocardial infarction    "slight"  . Osteopenia   . Pneumonia   . Pre-diabetes   . RLS (restless legs syndrome)   . Shortness of breath    with exertion    Past Surgical History: Past Surgical History:  Procedure Laterality Date  . AORTIC VALVE REPLACEMENT    . COMPRESSION HIP SCREW Left 12/26/2014  Procedure: ORIF LEFT HIP INTERTROCHANTRIC FRACTURE;  Surgeon: Frederik Pear, MD;  Location: [facility];  Service: Orthopedics;  Laterality: Left;  . CORONARY ARTERY BYPASS GRAFT  03/2011   1990's also  . LAPAROSCOPIC CHOLECYSTECTOMY    . MITRAL VALVE REPLACEMENT     1990's  . TONSILLECTOMY    . VIDEO BRONCHOSCOPY WITH ENDOBRONCHIAL NAVIGATION N/A 01/06/2014   Procedure: VIDEO BRONCHOSCOPY WITH ENDOBRONCHIAL NAVIGATION;  Surgeon: [name], MD;  Location: [facility];  Service: Thoracic;  Laterality: N/A;  . VIDEO BRONCHOSCOPY WITH ENDOBRONCHIAL NAVIGATION N/A 11/21/2014   Procedure: VIDEO BRONCHOSCOPY WITH ENDOBRONCHIAL NAVIGATION;  Surgeon: [name], MD;  Location: [facility];  Service:  Thoracic;  Laterality: N/A;  . VIDEO BRONCHOSCOPY WITH ENDOBRONCHIAL ULTRASOUND N/A 01/06/2014   Procedure: VIDEO BRONCHOSCOPY WITH ENDOBRONCHIAL ULTRASOUND;  Surgeon: [name], MD;  Location: [facility];  Service: Thoracic;  Laterality: N/A;  . VIDEO BRONCHOSCOPY WITH ENDOBRONCHIAL ULTRASOUND N/A 11/21/2014   Procedure: VIDEO BRONCHOSCOPY WITH ENDOBRONCHIAL ULTRASOUND;  Surgeon: [name], MD;  Location: [facility];  Service: Thoracic;  Laterality: N/A;    Social History:  Social History   Social History  . Marital status: Widowed    Spouse name: N/A  . Number of children: N/A  . Years of education: N/A   Occupational History  . Not on file.   Social History Main Topics  . Smoking status: Former Smoker    Packs/day: 0.50    Years: 30.00    Types: Cigarettes  . Smokeless tobacco: Never Used  . Alcohol use No  . Drug use: No  . Sexual activity: No     Comment: quit in 1980's   Other Topics Concern  . Not on file   Social History Narrative  . No narrative on file    Family History: Family History  Problem Relation Age of Onset  . Adopted: Yes  . Cancer Brother     lung  . Heart attack Neg Hx   . Stroke Neg Hx      ALLERGIES:  is allergic to prozac [fluoxetine hcl].  Meds: Current Outpatient Prescriptions  Medication Sig Dispense Refill  . acetaminophen (TYLENOL) 325 MG tablet Take 2 tablets (650 mg total) by mouth every 6 (six) hours as needed for mild pain (or Fever >/= 101).    [name] alendronate (FOSAMAX) 70 MG tablet Take 70 mg by mouth once a week.     [name] aspirin EC 81 MG tablet Take 81 mg by mouth every morning.    [name] atorvastatin (LIPITOR) 40 MG tablet Take 40 mg by mouth daily. For HLD    . CALCIUM CARBONATE PO Take 1 tablet by mouth 2 (two) times daily.    . colchicine 0.6 MG tablet Take 1 tablet (0.6 mg total) by mouth daily. 5 tablet 0  . esomeprazole (NEXIUM) 40 MG capsule Take 40 mg by mouth daily as needed (for acid reflux).     .  fluticasone-salmeterol (ADVAIR HFA) 115-21 MCG/ACT inhaler Inhale 2 puffs into the lungs 2 (two) times daily.     . furosemide (LASIX) 20 MG tablet Take 1 tablet (20 mg total) by mouth 2 (two) times daily. 30 tablet 0  . glimepiride (AMARYL) 1 MG tablet Take 1 mg by mouth daily with breakfast.    . Ipratropium-Albuterol (COMBIVENT) 20-100 MCG/ACT AERS respimat Inhale 1 puff into the lungs every 6 (six) hours as needed for wheezing or shortness of breath.     [name] ipratropium-albuterol (DUONEB) 0.5-2.5 (  3) MG/3ML SOLN Take 3 mLs by nebulization every 6 (six) hours as needed (for shortness of breath).     [name] levothyroxine (SYNTHROID, LEVOTHROID) 100 MCG tablet Take 100 mcg by mouth daily before breakfast.     . Multiple Vitamin (MULTIVITAMIN WITH MINERALS) TABS tablet Take 1 tablet by mouth daily.    . potassium chloride SA (K-DUR,KLOR-CON) 20 MEQ tablet Take 1 tablet (20 mEq total) by mouth daily. 7 tablet 0  . rOPINIRole (REQUIP) 4 MG tablet Take 4 mg by mouth at bedtime.    . TOPROL XL 50 MG 24 hr tablet TAKE 1 TABLET EVERY EVENING (DISCONTINUE CARVEDIOLOL PRESCRIPTION) 90 tablet 3  . vitamin C (ASCORBIC ACID) 500 MG tablet Take 500 mg by mouth daily.    [name] warfarin (COUMADIN) 1 MG tablet Take 2.5 tablets daily or as directed by [facility] 240 tablet 0  . HYDROcodone-homatropine (HYCODAN) 5-1.5 MG/5ML syrup Take 5 mLs by mouth every 6 (six) hours as needed for cough. (Patient not taking: Reported on 12/29/2015) 120 mL 0   No current facility-administered medications for this encounter.     Physical Findings:  height is '5\' 3"'$  (1.6 m) and weight is 127 lb 3.2 oz (57.7 kg). Her oral temperature is 98.4 F (36.9 C). Her blood pressure is 112/52 (abnormal) and her pulse is 86. Her respiration is 16 and oxygen saturation is 97%.  In general this is a well appearing Caucasian female in no acute distress. She is alert and oriented x4 and appropriate throughout the examination. HEENT reveals that the  patient is normocephalic, atraumatic. EOMs are intact. PERRLA. Skin is intact without any evidence of gross lesions. Cardiovascular exam reveals a regular rate and rhythm, no clicks rubs or murmurs are auscultated. Chest is clear to auscultation bilaterally. Lymphatic assessment is performed and does not reveal any adenopathy in the cervical, supraclavicular, axillary, or inguinal chains. Abdomen has active bowel sounds in all quadrants and is intact. The abdomen is soft, non tender, non distended. Lower extremities are negative for pretibial pitting edema, deep calf tenderness, cyanosis or clubbing.  Lab Findings: Lab Results  Component Value Date   WBC 12.0 (H) 12/16/2015   HGB 9.2 (L) 12/16/2015   HCT 31.9 (L) 12/16/2015   MCV 79.6 12/16/2015   PLT 235 12/16/2015     Radiographic Findings: Dg Chest 2 View  Result Date: 12/07/2015 CLINICAL DATA:  Worsening shortness of breath.  Known lung cancer. EXAM: CHEST  2 VIEW COMPARISON:  Chest CT from the same date. FINDINGS: The heart is normal in size and stable. Stable tortuosity and calcification of the thoracic aorta. Extensive tumor, radiation fibrosis and pleural fluid involving the right hemi thorax. The left lung is relatively clear. Minimal left basilar atelectasis. Stable surgical changes from valve replacement surgery. The bony thorax is grossly intact. IMPRESSION: Extensive radiation changes, tumor and pleural effusion involving the right hemi thorax. Electronically Signed   By: Marijo Sanes M.D.   On: 12/07/2015 18:23   Dg Ankle Complete Right  Result Date: 12/15/2015 CLINICAL DATA:  82 year old female with right ankle pain. No known injury. Initial encounter. EXAM: RIGHT ANKLE - COMPLETE 3+ VIEW COMPARISON:  None. FINDINGS: In general there is normal for age bone mineralization about the right ankle. Mortise joint alignment is preserved. There may be a healed chronic fracture of the distal right fibula as seen on image 2. There is mild  to moderate degenerative spurring at the medial and lateral malleolus. The talar dome appears  largely intact but there is mild irregularity and subchondral sclerosis along the lateral aspect of the dome (arrow on image 2). Calcaneus is intact with degenerative spurring. Visualized right foot osseous structures are intact. IMPRESSION: 1. Suggestion of osteochondral defect of the lateral talar dome. Otherwise no acute fracture or dislocation about the right ankle. 2. Probable healed fracture of the distal right fibula. Medial malleolus, lateral malleolus, and calcaneus degenerative spurring. Electronically Signed   By: Genevie Ann M.D.   On: 12/15/2015 16:50   Ct Chest W Contrast  Result Date: 12/07/2015 CLINICAL DATA:  Lung cancer, chemotherapy and radiation therapy complete. Shortness of breath. EXAM: CT CHEST WITH CONTRAST TECHNIQUE: Multidetector CT imaging of the chest was performed during intravenous contrast administration. CONTRAST:  93m ISOVUE-300 IOPAMIDOL (ISOVUE-300) INJECTION 61% COMPARISON:  PET 07/06/2015 and CT chest 06/01/2015. FINDINGS: Cardiovascular: Atherosclerotic calcification of the arterial vasculature. A large low-attenuation defect is seen in the posterior aspect of the left atrium, measuring 2.4 x 5.3 cm, new. Heart is at the upper limits of normal in size. No pericardial effusion. Mediastinum/Nodes: Hazy soft tissue fullness is seen in the low right internal jugular station (series 2, images 10-15), difficult to measure. Finding appears new from 06/01/2015. Low right paratracheal lymph node measures 1.7 cm, possibly stable but somewhat poorly defined on the prior study. Soft tissue is seen in the right hilum without a discrete lymph node. No axillary adenopathy. Pre pericardiac/juxta diaphragmatic lymph nodes are sub cm in short axis size. Lungs/Pleura: Increased masslike consolidation in the right upper lobe, difficult to measure, with an enlarging loculated right pleural effusion.  Likely radiation fibrosis in the perihilar right hemi thorax. Possible necrotic subpleural nodule in the anterior segment right upper lobe, measuring 2.6 cm (images 47-48), new. Previously seen hypermetabolic right lower lobe pulmonary nodule is no longer readily identified. Persistent volume loss in the left lower lobe. Centrilobular emphysema, mild. Upper Abdomen: Visualized portions of the liver, adrenal glands, kidneys, spleen, pancreas, stomach and bowel are grossly unremarkable. No upper abdominal adenopathy. Cholecystectomy. Musculoskeletal: Likely pathologic fracture and associated healing involving the right third lateral rib (series 2, image 34). IMPRESSION: 1. Large left atrial bland versus tumor thrombus, new. Critical Value/emergent results were called by telephone at the time of interpretation on 12/07/2015 at 9:16 am to Dr. MCurt Bears, who verbally acknowledged these results. 2. Increasing masslike consolidation and possible separate new necrotic nodule in the right upper lobe with increasing right loculated pleural effusion and new low right internal jugular ill-defined soft tissue, findings all worrisome for disease progression. 3. Previously seen hypermetabolic right lower lobe nodule is no longer identified. 4. Likely healing pathologic fracture of the lateral right third rib. 5. Aortic atherosclerosis. Electronically Signed   By: MLorin PicketM.D.   On: 12/07/2015 09:17    Impression/Plan: 1. Progressive Stage IIIA, T2N2 NSCLC, squamous cell carcinoma of the RUL. The patient will continue to follow up with Dr. MJulien Nordmannand proceed today with immunotherapy. We will follow along with her at subsequent visits. 2. Putative Stage IA NSCLC of the right lower lobe. The patient has done well since radiotherapy. Her RLL lesion is not seen on her recent imaging scans. We will follow up with her in 6 months time and coordinate appointments around the time of her visits with Dr. MJulien Nordmann        ACarola Rhine PAC

## 2015-12-29 NOTE — Progress Notes (Signed)
Marissa [name] here for reassessment S/P SBRT to RLL.  Previous SBRT on RUL. Immunotherapy with Nivolumab every 2 weeks - first dose 12/29/15.  Past hx of chemotherapy - 6 cycles of Carboplatin and Paclitaxel. Intermittent coughing with whitish sputum -has emphysema. Daughter states that she desats when ambulating. Using Spirometer and  flutter.  In home Physical Therapy to strengthen respiratory status.  02 at 3 liters/min constantly. Notes intermittent pain in mid-back.  BP (!) 112/52   Pulse 86   Temp 98.4 F (36.9 C) (Oral)   Resp 16   Ht '5\' 3"'$  (1.6 m)   Wt 127 lb 3.2 oz (57.7 kg)   SpO2 97%   BMI 22.53 kg/m    Wt Readings from Last 3 Encounters:  12/29/15 127 lb 3.2 oz (57.7 kg)  12/21/15 128 lb 12.8 oz (58.4 kg)  12/16/15 128 lb 3.2 oz (58.2 kg)

## 2015-12-29 NOTE — Patient Instructions (Addendum)
[facility] Discharge Instructions for Patients Receiving Chemotherapy  Today you received the following chemotherapy agent(s) Nivolumab.  To help prevent nausea and vomiting after your treatment, we encourage you to take your nausea medication as instructed by your MD.   If you develop nausea and vomiting that is not controlled by your nausea medication, call the clinic.   BELOW ARE SYMPTOMS THAT SHOULD BE REPORTED IMMEDIATELY:  *FEVER GREATER THAN 100.5 F  *CHILLS WITH OR WITHOUT FEVER  NAUSEA AND VOMITING THAT IS NOT CONTROLLED WITH YOUR NAUSEA MEDICATION  *UNUSUAL SHORTNESS OF BREATH  *UNUSUAL BRUISING OR BLEEDING  TENDERNESS IN MOUTH AND THROAT WITH OR WITHOUT PRESENCE OF ULCERS  *URINARY PROBLEMS  *BOWEL PROBLEMS  UNUSUAL RASH Items with * indicate a potential emergency and should be followed up as soon as possible.  Feel free to call the clinic you have any questions or concerns. The clinic phone number is [phone].  Please show the [facility] at check-in to the Emergency Department and triage nurse.  Nivolumab injection OPDIVO  What is this medicine? NIVOLUMAB (nye VOL ue mab) is a monoclonal antibody. It is used to treat melanoma, lung cancer, kidney cancer, and Hodgkin lymphoma. This medicine may be used for other purposes; ask your health care provider or pharmacist if you have questions. What should I tell my health care provider before I take this medicine? They need to know if you have any of these conditions: -diabetes -immune system problems -kidney disease -liver disease -lung disease -organ transplant -stomach or intestine problems -thyroid disease -an unusual or allergic reaction to nivolumab, other medicines, foods, dyes, or preservatives -pregnant or trying to get pregnant -breast-feeding How should I use this medicine? This medicine is for infusion into a vein. It is given by a health care professional in a hospital  or clinic setting. A special MedGuide will be given to you before each treatment. Be sure to read this information carefully each time. Talk to your pediatrician regarding the use of this medicine in children. Special care may be needed. Overdosage: If you think you have taken too much of this medicine contact a poison control center or emergency room at once. NOTE: This medicine is only for you. Do not share this medicine with others. What if I miss a dose? It is important not to miss your dose. Call your doctor or health care professional if you are unable to keep an appointment. What may interact with this medicine? Interactions have not been studied. Give your health care provider a list of all the medicines, herbs, non-prescription drugs, or dietary supplements you use. Also tell them if you smoke, drink alcohol, or use illegal drugs. Some items may interact with your medicine. This list may not describe all possible interactions. Give your health care provider a list of all the medicines, herbs, non-prescription drugs, or dietary supplements you use. Also tell them if you smoke, drink alcohol, or use illegal drugs. Some items may interact with your medicine. What should I watch for while using this medicine? This drug may make you feel generally unwell. Continue your course of treatment even though you feel ill unless your doctor tells you to stop. You may need blood work done while you are taking this medicine. Do not become pregnant while taking this medicine or for 5 months after stopping it. Women should inform their doctor if they wish to become pregnant or think they might be pregnant. There is a potential for serious  side effects to an unborn child. Talk to your health care professional or pharmacist for more information. Do not breast-feed an infant while taking this medicine. What side effects may I notice from receiving this medicine? Side effects that you should report to your doctor  or health care professional as soon as possible: -allergic reactions like skin rash, itching or hives, swelling of the face, lips, or tongue -black, tarry stools -blood in the urine -bloody or watery diarrhea -changes in vision -change in sex drive -changes in emotions or moods -chest pain -confusion -cough -decreased appetite -diarrhea -facial flushing -feeling faint or lightheaded -fever, chills -hair loss -hallucination, loss of contact with reality -headache -irritable -joint pain -loss of memory -muscle pain -muscle weakness -seizures -shortness of breath -signs and symptoms of high blood sugar such as dizziness; dry mouth; dry skin; fruity breath; nausea; stomach pain; increased hunger or thirst; increased urination -signs and symptoms of kidney injury like trouble passing urine or change in the amount of urine -signs and symptoms of liver injury like dark yellow or brown urine; general ill feeling or flu-like symptoms; light-colored stools; loss of appetite; nausea; right upper belly pain; unusually weak or tired; yellowing of the eyes or skin -stiff neck -swelling of the ankles, feet, hands -weight gain Side effects that usually do not require medical attention (report to your doctor or health care professional if they continue or are bothersome): -bone pain -constipation -tiredness -vomiting This list may not describe all possible side effects. Call your doctor for medical advice about side effects. You may report side effects to FDA at 1-800-FDA-1088. Where should I keep my medicine? This drug is given in a hospital or clinic and will not be stored at home. NOTE: This sheet is a summary. It may not cover all possible information. If you have questions about this medicine, talk to your doctor, pharmacist, or health care provider.    2016, [employer]vier/Gold Standard. (2014-08-06 10:03:42)

## 2015-12-30 DIAGNOSIS — I5032 Chronic diastolic (congestive) heart failure: Secondary | ICD-10-CM | POA: Diagnosis not present

## 2015-12-30 DIAGNOSIS — I513 Intracardiac thrombosis, not elsewhere classified: Secondary | ICD-10-CM | POA: Diagnosis not present

## 2015-12-30 DIAGNOSIS — C3411 Malignant neoplasm of upper lobe, right bronchus or lung: Secondary | ICD-10-CM | POA: Diagnosis not present

## 2015-12-30 DIAGNOSIS — I110 Hypertensive heart disease with heart failure: Secondary | ICD-10-CM | POA: Diagnosis not present

## 2015-12-30 DIAGNOSIS — E119 Type 2 diabetes mellitus without complications: Secondary | ICD-10-CM | POA: Diagnosis not present

## 2015-12-30 DIAGNOSIS — J449 Chronic obstructive pulmonary disease, unspecified: Secondary | ICD-10-CM | POA: Diagnosis not present

## 2015-12-31 DIAGNOSIS — I110 Hypertensive heart disease with heart failure: Secondary | ICD-10-CM | POA: Diagnosis not present

## 2015-12-31 DIAGNOSIS — J449 Chronic obstructive pulmonary disease, unspecified: Secondary | ICD-10-CM | POA: Diagnosis not present

## 2015-12-31 DIAGNOSIS — I5032 Chronic diastolic (congestive) heart failure: Secondary | ICD-10-CM | POA: Diagnosis not present

## 2015-12-31 DIAGNOSIS — I513 Intracardiac thrombosis, not elsewhere classified: Secondary | ICD-10-CM | POA: Diagnosis not present

## 2015-12-31 DIAGNOSIS — E119 Type 2 diabetes mellitus without complications: Secondary | ICD-10-CM | POA: Diagnosis not present

## 2015-12-31 DIAGNOSIS — C3411 Malignant neoplasm of upper lobe, right bronchus or lung: Secondary | ICD-10-CM | POA: Diagnosis not present

## 2016-01-01 ENCOUNTER — Ambulatory Visit (INDEPENDENT_AMBULATORY_CARE_PROVIDER_SITE_OTHER): Payer: Medicare Other

## 2016-01-01 ENCOUNTER — Encounter: Payer: Self-pay | Admitting: Cardiology

## 2016-01-01 ENCOUNTER — Ambulatory Visit (INDEPENDENT_AMBULATORY_CARE_PROVIDER_SITE_OTHER): Payer: Medicare Other | Admitting: Cardiology

## 2016-01-01 VITALS — BP 116/60 | HR 90 | Ht 63.0 in | Wt 127.8 lb

## 2016-01-01 DIAGNOSIS — I513 Intracardiac thrombosis, not elsewhere classified: Secondary | ICD-10-CM | POA: Diagnosis not present

## 2016-01-01 DIAGNOSIS — I2510 Atherosclerotic heart disease of native coronary artery without angina pectoris: Secondary | ICD-10-CM | POA: Diagnosis not present

## 2016-01-01 DIAGNOSIS — I482 Chronic atrial fibrillation, unspecified: Secondary | ICD-10-CM

## 2016-01-01 DIAGNOSIS — I2583 Coronary atherosclerosis due to lipid rich plaque: Secondary | ICD-10-CM

## 2016-01-01 DIAGNOSIS — Z952 Presence of prosthetic heart valve: Secondary | ICD-10-CM

## 2016-01-01 DIAGNOSIS — I351 Nonrheumatic aortic (valve) insufficiency: Secondary | ICD-10-CM

## 2016-01-01 DIAGNOSIS — Z5181 Encounter for therapeutic drug level monitoring: Secondary | ICD-10-CM | POA: Diagnosis not present

## 2016-01-01 DIAGNOSIS — I5032 Chronic diastolic (congestive) heart failure: Secondary | ICD-10-CM

## 2016-01-01 LAB — POCT INR: INR: 3.2

## 2016-01-01 NOTE — Patient Instructions (Signed)
Medication Instructions:  Your physician recommends that you continue on your current medications as directed. Please refer to the Current Medication list given to you today.  Labwork: None   Testing/Procedures: None   Follow-Up: Your physician recommends that you schedule a follow-up appointment in: 4 months with Dr Marlou Porch on a Monday.   Any Other Special Instructions Will Be Listed Below (If Applicable).     If you need a refill on your cardiac medications before your next appointment, please call your pharmacy.

## 2016-01-01 NOTE — Progress Notes (Signed)
Cardiology Office Note   Date:  01/01/2016   ID:  Marissa [name], DOB 1933-04-05, MRN [id number]  PCP:  [name], MD  Cardiologist:  Dr. Marlou Porch     Chief Complaint  Patient presents with  . Hospitalization Follow-up    no complints      History of Present Illness: Marissa [name] is a 82 y.o. female who presents for hospital follow up for Lt atrial thrombus.  This was found on CT of chest.  IV heparin was started in the hospital.  This was changed to coumadin.    She has a hx. coronary artery disease status post bypass surgery with redo bypass surgery in January of 2013, COPD, aortic valve replacement in 2013 with 21 mm porcine valve, history of rheumatic fever with mitral valve replacement in 2013 with 27 mm porcine valve, history of atrial fibrillation, pulmonary hypertension, heart failure, stage IIIA Non-small cell lung cancer, squamous cell carcinoma presented with recurrence in the right upper lobe and additional mediastinal lymphadenopathy diagnosed in September 2016 s/p chemoradiation with carboplatin and paclitaxel here for follow-up.  She is demonstrating fairly significant shortness of breath which is at baseline.  Coronary bypass surgery was performed in [facility]. She had moderate aortic regurgitation, moderate to severe pulmonary hypertension, right coronary artery was occluded, patent right coronary bypass graft with some irregularity, LIMA to LAD patent in 2013.   She had a chronic long-standing atrial fibrillation history but has been intolerant to anticoagulants. Coumadin very small doses have caused bleeding into her ear, she states. She's been reluctant to try anticoagulantion again because of this. She has never had a stroke. Her redo bypass involved a saphenous vein graft to the RCA and redo mitral valve replacement.  She has had a previous admission to [facility] for "diastolic heart failure ".Normal ejection fraction, normal  functioning mitral valve and aortic valve prosthesis. Eliquis at that time was started but she stopped taking.  Micardis was discontinued secondary to low blood pressure, Lasix was changed to 40 a.m., 20 p.m.  Also since her last visit, aortic insufficiency has developed, loud murmur noted. Echocardiogram reviewed from October 2016.she also has suffered a hip fracture. Continue to encourage physical therapy. Home exercises.   11/09/15-overall doing well. Cancer therapy has been stable. Feels tired this morning. No significant change in cardiac symptoms. She is not short of breath other than baseline. No chest pain. Still has mild diastolic musical murmur. Prior echocardiogram reviewed.  Now presents post hospitalization with Lt atrial thrombus.  Patient underwent echocardiography which showed left ventricular systolic function of 99-37%, with increased wall thickness, LVH pattern. Left atrium with no thrombus seen but severely dilated.  Aortic and mitral valve bioprosthesis were present mild aortic regurg. And Mitral valve pressure by half time 2.37 cmsq.. Cardiology consultation, recommended continue full anticoagulation with warfarin with goal INR 2-2.5 and no need for transesophageal echocardiography.  She is now on coumadin and to be checked again next week.  They request not so many trips, perhaps home health could draw INR or at cancer center.  They will discuss with coumadin clinic next week.   Today No chest pain her chronic SOB may be mildly increased.  She is anemic- has dropped from 11.2 to 9.6. She denies any blood in her stool or urine.  She continues with her oxygen at 2L [place].   Dr. Julien Nordmann is following her INR at the oncology clinic. She is tired going from visit to visit.  Past Medical History:  Diagnosis Date  . A-fib ([place])   . Aortic stenosis    (s/p AVR w/ 21 mm porcine valve 03/2011  . Aortic valve disorders   . Arthritis   . CAD (coronary artery disease)    s/p CABG  03/2011  . CHF (congestive heart failure) ([place])   . COPD (chronic obstructive pulmonary disease) ([place])   . Diabetes mellitus without complication ([place])   . Dysrhythmia   . Encounter for antineoplastic immunotherapy 12/21/2015  . Full code status 12/22/2014  . Full code status 12/22/2014  . GERD (gastroesophageal reflux disease)    takes nexium prn  . History of blood transfusion    Rheumatic Fever  . HTN (hypertension)   . Hypercholesteremia   . Hypothyroidism   . Lung cancer ([place])   . Mastoiditis   . Mitral valve disease    s/p MVR 03/2011 w/ 27 mm porcine valve  . Myocardial infarction    "slight"  . Osteopenia   . Pneumonia   . Pre-diabetes   . RLS (restless legs syndrome)   . Shortness of breath    with exertion    Past Surgical History:  Procedure Laterality Date  . AORTIC VALVE REPLACEMENT    . COMPRESSION HIP SCREW Left 12/26/2014   Procedure: ORIF LEFT HIP INTERTROCHANTRIC FRACTURE;  Surgeon: Frederik Pear, MD;  Location: [facility];  Service: Orthopedics;  Laterality: Left;  . CORONARY ARTERY BYPASS GRAFT  03/2011   1990's also  . LAPAROSCOPIC CHOLECYSTECTOMY    . MITRAL VALVE REPLACEMENT     1990's  . TONSILLECTOMY    . VIDEO BRONCHOSCOPY WITH ENDOBRONCHIAL NAVIGATION N/A 01/06/2014   Procedure: VIDEO BRONCHOSCOPY WITH ENDOBRONCHIAL NAVIGATION;  Surgeon: [name], MD;  Location: [facility];  Service: Thoracic;  Laterality: N/A;  . VIDEO BRONCHOSCOPY WITH ENDOBRONCHIAL NAVIGATION N/A 11/21/2014   Procedure: VIDEO BRONCHOSCOPY WITH ENDOBRONCHIAL NAVIGATION;  Surgeon: [name], MD;  Location: [facility];  Service: Thoracic;  Laterality: N/A;  . VIDEO BRONCHOSCOPY WITH ENDOBRONCHIAL ULTRASOUND N/A 01/06/2014   Procedure: VIDEO BRONCHOSCOPY WITH ENDOBRONCHIAL ULTRASOUND;  Surgeon: [name], MD;  Location: [facility];  Service: Thoracic;  Laterality: N/A;  . VIDEO BRONCHOSCOPY WITH ENDOBRONCHIAL ULTRASOUND N/A 11/21/2014   Procedure: VIDEO BRONCHOSCOPY WITH  ENDOBRONCHIAL ULTRASOUND;  Surgeon: [name], MD;  Location: [facility];  Service: Thoracic;  Laterality: N/A;     Current Outpatient Prescriptions  Medication Sig Dispense Refill  . acetaminophen (TYLENOL) 325 MG tablet Take 2 tablets (650 mg total) by mouth every 6 (six) hours as needed for mild pain (or Fever >/= 101).    [name] albuterol (PROVENTIL) (2.5 MG/3ML) 0.083% nebulizer solution Inhale 3 mLs into the lungs 3 (three) times daily.    [name] alendronate (FOSAMAX) 70 MG tablet Take 70 mg by mouth once a week.     [name] aspirin EC 81 MG tablet Take 81 mg by mouth every morning.    [name] atorvastatin (LIPITOR) 40 MG tablet Take 40 mg by mouth daily. For HLD    . CALCIUM CARBONATE PO Take 1 tablet by mouth 2 (two) times daily.    . colchicine 0.6 MG tablet Take 1 tablet (0.6 mg total) by mouth daily. 5 tablet 0  . esomeprazole (NEXIUM) 40 MG capsule Take 40 mg by mouth daily as needed (for acid reflux).     . fluticasone-salmeterol (ADVAIR HFA) 115-21 MCG/ACT inhaler Inhale 2 puffs into the lungs 2 (two) times daily.     [name]  furosemide (LASIX) 20 MG tablet Take 1 tablet (20 mg total) by mouth 2 (two) times daily. 30 tablet 0  . glimepiride (AMARYL) 1 MG tablet Take 1 mg by mouth daily with breakfast.    . HYDROcodone-homatropine (HYCODAN) 5-1.5 MG/5ML syrup Take 5 mLs by mouth every 6 (six) hours as needed for cough. 120 mL 0  . Ipratropium-Albuterol (COMBIVENT) 20-100 MCG/ACT AERS respimat Inhale 1 puff into the lungs every 6 (six) hours as needed for wheezing or shortness of breath.     [name] ipratropium-albuterol (DUONEB) 0.5-2.5 (3) MG/3ML SOLN Take 3 mLs by nebulization every 6 (six) hours as needed (for shortness of breath).     [name] levothyroxine (SYNTHROID, LEVOTHROID) 100 MCG tablet Take 100 mcg by mouth daily before breakfast.     . Multiple Vitamin (MULTIVITAMIN WITH MINERALS) TABS tablet Take 1 tablet by mouth daily.    . potassium chloride SA (K-DUR,KLOR-CON) 20 MEQ tablet Take 1 tablet (20 mEq  total) by mouth daily. 7 tablet 0  . rOPINIRole (REQUIP) 4 MG tablet Take 4 mg by mouth at bedtime.    . TOPROL XL 50 MG 24 hr tablet TAKE 1 TABLET EVERY EVENING (DISCONTINUE CARVEDIOLOL PRESCRIPTION) 90 tablet 3  . vitamin C (ASCORBIC ACID) 500 MG tablet Take 500 mg by mouth daily.    [name] warfarin (COUMADIN) 1 MG tablet Take 2.5 tablets daily or as directed by Coumadin Clinic 240 tablet 0   No current facility-administered medications for this visit.     Allergies:   Prozac [fluoxetine hcl]    Social History:  The patient  reports that she has quit smoking. Her smoking use included Cigarettes. She has a 15.00 pack-year smoking history. She has never used smokeless tobacco. She reports that she does not drink alcohol or use drugs.   Family History:  The patient's  family history includes Cancer in her brother. She was adopted.    ROS:  General:no colds or fevers, no weight changes Skin:no rashes or ulcers HEENT:no blurred vision, no congestion CV:see HPI PUL:see HPI GI:no diarrhea constipation or melena, no indigestion GU:no hematuria, no dysuria MS:no joint pain, no claudication Neuro:no syncope, no lightheadedness Endo:+ diabetes, + thyroid disease  Wt Readings from Last 3 Encounters:  01/01/16 127 lb 12.8 oz (58 kg)  12/29/15 127 lb 3.2 oz (57.7 kg)  12/21/15 128 lb 12.8 oz (58.4 kg)     PHYSICAL EXAM: VS:  BP 116/60   Pulse 90   Ht '5\' 3"'$  (1.6 m)   Wt 127 lb 12.8 oz (58 kg)   BMI 22.64 kg/m  , BMI Body mass index is 22.64 kg/m. General:Pleasant affect but flat, NAD Skin:Warm and dry, brisk capillary refill HEENT:normocephalic, sclera clear, mucus membranes moist, oxygen per nasal cannula. Neck:supple, no JVD, no bruits  Heart:irreg irregular with harsh  murmur, no gallup, rub or click Lungs:diminished breath sounds without rales, rhonchi, or wheezes WGN:FAOZ, non tender, + BS, do not palpate liver spleen or masses Ext:no lower ext edema, 2+ pedal pulses, 2+ radial  pulses Neuro:alert and oriented X 3, MAE, follows commands, + facial symmetry    EKG:  EKG is ordered today. The ekg ordered today demonstrates  A fib with rate controlled at 90, incomplete LBBB no acute changes from hospital EKG still with occ PVC.   Recent Labs: 12/08/2015: B Natriuretic Peptide 146.4 12/29/2015: ALT 12; BUN 17.5; Creatinine 0.8; HGB 9.6; Platelets 244; Potassium 3.4; Sodium 143    Lipid Panel    Component Value  Date/Time   CHOL 81 11/04/2013 0343   TRIG 188 11/04/2013 0343   HDL 28 (L) 11/04/2013 0343   VLDL 38 11/04/2013 0343   LDLCALC 15 11/04/2013 0343       Other studies Reviewed: Additional studies/ records that were reviewed today include: .  ECHO: Study Conclusions  - Procedure narrative: Transthoracic echocardiography. Image   quality was adequate. The study was technically difficult.   Intravenous contrast (Definity) was administered. - Left ventricle: The cavity size was normal. Wall thickness was   increased in a pattern of mild LVH. Systolic function was normal.   The estimated ejection fraction was in the range of 60% to 65%.   Wall motion was normal; there were no regional wall motion   abnormalities. - Aortic valve: A bioprosthesis was present. There was mild   regurgitation. - Mitral valve: A bioprosthesis was present. Valve area by pressure   half-time: 2.37 cm^2. - Left atrium: The atrium was moderately to severely dilated. - Pulmonary arteries: Systolic pressure was moderately to severely   increased. PA peak pressure: 67 mm Hg (S).   CT of chest COMPARISON:  PET 07/06/2015 and CT chest 06/01/2015.  FINDINGS: Cardiovascular: Atherosclerotic calcification of the arterial vasculature. A large low-attenuation defect is seen in the posterior aspect of the left atrium, measuring 2.4 x 5.3 cm, new. Heart is at the upper limits of normal in size. No pericardial effusion.  Mediastinum/Nodes: Hazy soft tissue fullness is seen  in the low right internal jugular station (series 2, images 10-15), difficult to measure. Finding appears new from 06/01/2015. Low right paratracheal lymph node measures 1.7 cm, possibly stable but somewhat poorly defined on the prior study. Soft tissue is seen in the right hilum without a discrete lymph node. No axillary adenopathy. Pre pericardiac/juxta diaphragmatic lymph nodes are sub cm in short axis size.  Lungs/Pleura: Increased masslike consolidation in the right upper lobe, difficult to measure, with an enlarging loculated right pleural effusion. Likely radiation fibrosis in the perihilar right hemi thorax. Possible necrotic subpleural nodule in the anterior segment right upper lobe, measuring 2.6 cm (images 47-48), new. Previously seen hypermetabolic right lower lobe pulmonary nodule is no longer readily identified. Persistent volume loss in the left lower lobe. Centrilobular emphysema, mild.  Upper Abdomen: Visualized portions of the liver, adrenal glands, kidneys, spleen, pancreas, stomach and bowel are grossly unremarkable. No upper abdominal adenopathy. Cholecystectomy.  Musculoskeletal: Likely pathologic fracture and associated healing involving the right third lateral rib (series 2, image 34).  IMPRESSION: 1. Large left atrial bland versus tumor thrombus, new. Critical Value/emergent results were called by telephone at the time of interpretation on 12/07/2015 at 9:16 am to Dr. Curt Bears , who verbally acknowledged these results. 2. Increasing masslike consolidation and possible separate new necrotic nodule in the right upper lobe with increasing right loculated pleural effusion and new low right internal jugular ill-defined soft tissue, findings all worrisome for disease progression. 3. Previously seen hypermetabolic right lower lobe nodule is no longer identified. 4. Likely healing pathologic fracture of the lateral right third rib. 5. Aortic  atherosclerosis.   ASSESSMENT AND PLAN:  1.  Lt atrial thrombus no on coumadin, daughter is stressed that her mom is tired with so many appointments.  They would prefer NOAC but with hx of valve replacements coumadin is best choice.  Will check with coumadin clinic if [facility] could draw INR and send to our clinic.  Goal 2-2.5.  Also has had some bleeding in past.  Follow up with Dr. Marlou Porch in 4 months.  1. 2..Coronary artery disease-status post bypass. No active anginal symptoms. Prior bypass anatomy reviewed. 3. Chronic diastolic heart failure- Lasix.dosages reviewed. 4.  Atrial fibrillation ate controlled.  5. AVR stable  6. MVR stable  7. COPD on chronic oxygen   Current medicines are reviewed with the patient today.  The patient Has no concerns regarding medicines.  The following changes have been made:  See above Labs/ tests ordered today include:see above  Disposition:   FU:  see above  Signed, Cecilie Kicks, NP  01/01/2016 3:14 PM    [address], [place], [place], [place] Phone: [phone]; Fax: [phone]

## 2016-01-04 DIAGNOSIS — J449 Chronic obstructive pulmonary disease, unspecified: Secondary | ICD-10-CM | POA: Diagnosis not present

## 2016-01-04 DIAGNOSIS — I5032 Chronic diastolic (congestive) heart failure: Secondary | ICD-10-CM | POA: Diagnosis not present

## 2016-01-04 DIAGNOSIS — C3411 Malignant neoplasm of upper lobe, right bronchus or lung: Secondary | ICD-10-CM | POA: Diagnosis not present

## 2016-01-04 DIAGNOSIS — I110 Hypertensive heart disease with heart failure: Secondary | ICD-10-CM | POA: Diagnosis not present

## 2016-01-04 DIAGNOSIS — I513 Intracardiac thrombosis, not elsewhere classified: Secondary | ICD-10-CM | POA: Diagnosis not present

## 2016-01-04 DIAGNOSIS — E119 Type 2 diabetes mellitus without complications: Secondary | ICD-10-CM | POA: Diagnosis not present

## 2016-01-06 DIAGNOSIS — I5032 Chronic diastolic (congestive) heart failure: Secondary | ICD-10-CM | POA: Diagnosis not present

## 2016-01-06 DIAGNOSIS — I513 Intracardiac thrombosis, not elsewhere classified: Secondary | ICD-10-CM | POA: Diagnosis not present

## 2016-01-06 DIAGNOSIS — J449 Chronic obstructive pulmonary disease, unspecified: Secondary | ICD-10-CM | POA: Diagnosis not present

## 2016-01-06 DIAGNOSIS — E119 Type 2 diabetes mellitus without complications: Secondary | ICD-10-CM | POA: Diagnosis not present

## 2016-01-06 DIAGNOSIS — I110 Hypertensive heart disease with heart failure: Secondary | ICD-10-CM | POA: Diagnosis not present

## 2016-01-06 DIAGNOSIS — C3411 Malignant neoplasm of upper lobe, right bronchus or lung: Secondary | ICD-10-CM | POA: Diagnosis not present

## 2016-01-07 DIAGNOSIS — I5032 Chronic diastolic (congestive) heart failure: Secondary | ICD-10-CM | POA: Diagnosis not present

## 2016-01-07 DIAGNOSIS — I513 Intracardiac thrombosis, not elsewhere classified: Secondary | ICD-10-CM | POA: Diagnosis not present

## 2016-01-07 DIAGNOSIS — C3411 Malignant neoplasm of upper lobe, right bronchus or lung: Secondary | ICD-10-CM | POA: Diagnosis not present

## 2016-01-07 DIAGNOSIS — E119 Type 2 diabetes mellitus without complications: Secondary | ICD-10-CM | POA: Diagnosis not present

## 2016-01-07 DIAGNOSIS — J449 Chronic obstructive pulmonary disease, unspecified: Secondary | ICD-10-CM | POA: Diagnosis not present

## 2016-01-07 DIAGNOSIS — I110 Hypertensive heart disease with heart failure: Secondary | ICD-10-CM | POA: Diagnosis not present

## 2016-01-08 ENCOUNTER — Ambulatory Visit (INDEPENDENT_AMBULATORY_CARE_PROVIDER_SITE_OTHER): Payer: Medicare Other | Admitting: *Deleted

## 2016-01-08 DIAGNOSIS — I482 Chronic atrial fibrillation, unspecified: Secondary | ICD-10-CM

## 2016-01-08 DIAGNOSIS — Z5181 Encounter for therapeutic drug level monitoring: Secondary | ICD-10-CM | POA: Diagnosis not present

## 2016-01-08 DIAGNOSIS — Z952 Presence of prosthetic heart valve: Secondary | ICD-10-CM

## 2016-01-08 LAB — POCT INR: INR: 2.9

## 2016-01-12 ENCOUNTER — Other Ambulatory Visit (HOSPITAL_BASED_OUTPATIENT_CLINIC_OR_DEPARTMENT_OTHER): Payer: Medicare Other

## 2016-01-12 ENCOUNTER — Telehealth: Payer: Self-pay | Admitting: Internal Medicine

## 2016-01-12 ENCOUNTER — Ambulatory Visit (HOSPITAL_BASED_OUTPATIENT_CLINIC_OR_DEPARTMENT_OTHER): Payer: Medicare Other | Admitting: Nurse Practitioner

## 2016-01-12 ENCOUNTER — Ambulatory Visit (HOSPITAL_BASED_OUTPATIENT_CLINIC_OR_DEPARTMENT_OTHER): Payer: Medicare Other

## 2016-01-12 ENCOUNTER — Other Ambulatory Visit: Payer: Self-pay | Admitting: Medical Oncology

## 2016-01-12 ENCOUNTER — Telehealth: Payer: Self-pay | Admitting: Medical Oncology

## 2016-01-12 VITALS — BP 132/60 | HR 88 | Temp 98.1°F | Resp 16 | Ht 63.0 in | Wt 125.3 lb

## 2016-01-12 DIAGNOSIS — Z79899 Other long term (current) drug therapy: Secondary | ICD-10-CM

## 2016-01-12 DIAGNOSIS — I219 Acute myocardial infarction, unspecified: Secondary | ICD-10-CM

## 2016-01-12 DIAGNOSIS — Z5112 Encounter for antineoplastic immunotherapy: Secondary | ICD-10-CM | POA: Diagnosis present

## 2016-01-12 DIAGNOSIS — C3411 Malignant neoplasm of upper lobe, right bronchus or lung: Secondary | ICD-10-CM | POA: Diagnosis not present

## 2016-01-12 DIAGNOSIS — C3431 Malignant neoplasm of lower lobe, right bronchus or lung: Secondary | ICD-10-CM

## 2016-01-12 LAB — CBC WITH DIFFERENTIAL/PLATELET
BASO%: 0.6 % (ref 0.0–2.0)
BASOS ABS: 0.1 10*3/uL (ref 0.0–0.1)
EOS ABS: 0.1 10*3/uL (ref 0.0–0.5)
EOS%: 0.9 % (ref 0.0–7.0)
HCT: 32.3 % — ABNORMAL LOW (ref 34.8–46.6)
HGB: 10 g/dL — ABNORMAL LOW (ref 11.6–15.9)
LYMPH%: 6.2 % — AB (ref 14.0–49.7)
MCH: 22.7 pg — AB (ref 25.1–34.0)
MCHC: 31.1 g/dL — AB (ref 31.5–36.0)
MCV: 72.9 fL — AB (ref 79.5–101.0)
MONO#: 0.9 10*3/uL (ref 0.1–0.9)
MONO%: 7.7 % (ref 0.0–14.0)
NEUT#: 9.6 10*3/uL — ABNORMAL HIGH (ref 1.5–6.5)
NEUT%: 84.6 % — AB (ref 38.4–76.8)
Platelets: 291 10*3/uL (ref 145–400)
RBC: 4.43 10*6/uL (ref 3.70–5.45)
RDW: 18.3 % — ABNORMAL HIGH (ref 11.2–14.5)
WBC: 11.3 10*3/uL — ABNORMAL HIGH (ref 3.9–10.3)
lymph#: 0.7 10*3/uL — ABNORMAL LOW (ref 0.9–3.3)

## 2016-01-12 LAB — COMPREHENSIVE METABOLIC PANEL
ALBUMIN: 3.2 g/dL — AB (ref 3.5–5.0)
ALK PHOS: 119 U/L (ref 40–150)
ALT: 13 U/L (ref 0–55)
AST: 20 U/L (ref 5–34)
Anion Gap: 11 mEq/L (ref 3–11)
BUN: 17.8 mg/dL (ref 7.0–26.0)
CHLORIDE: 96 meq/L — AB (ref 98–109)
CO2: 32 mEq/L — ABNORMAL HIGH (ref 22–29)
Calcium: 9.7 mg/dL (ref 8.4–10.4)
Creatinine: 0.8 mg/dL (ref 0.6–1.1)
EGFR: 75 mL/min/{1.73_m2} — AB (ref >90)
GLUCOSE: 63 mg/dL — AB (ref 70–140)
POTASSIUM: 3 meq/L — AB (ref 3.5–5.1)
SODIUM: 139 meq/L (ref 136–145)
Total Bilirubin: 0.76 mg/dL (ref 0.20–1.20)
Total Protein: 7.2 g/dL (ref 6.4–8.3)

## 2016-01-12 LAB — TSH: TSH: 4.17 m(IU)/L — ABNORMAL HIGH (ref 0.308–3.960)

## 2016-01-12 MED ORDER — SODIUM CHLORIDE 0.9 % IV SOLN
Freq: Once | INTRAVENOUS | Status: AC
  Administered 2016-01-12: 16:00:00 via INTRAVENOUS

## 2016-01-12 MED ORDER — NIVOLUMAB CHEMO INJECTION 100 MG/10ML
240.0000 mg | Freq: Once | INTRAVENOUS | Status: AC
  Administered 2016-01-12: 240 mg via INTRAVENOUS
  Filled 2016-01-12: qty 4

## 2016-01-12 NOTE — Telephone Encounter (Addendum)
Called to lobby because of a concern by registration that 'the pt did not look good" . Pt was sitting in wheelchair, wearing oxygen via nasal cannula at 4 l and  attended by nurse . Pt in NAD. Pt reports "seeing double and my head don't feel right". She vomited x 1 this am and according to daughter who is with pt she has a hx of vertigo.. VSS.I left pt in lobby with her daughter  waiting on lab and then scheduled to see Ned Card, NP.

## 2016-01-12 NOTE — Patient Instructions (Signed)
[facility] Discharge Instructions for Patients Receiving Chemotherapy  Today you received the following chemotherapy agent(s) Nivolumab.  To help prevent nausea and vomiting after your treatment, we encourage you to take your nausea medication as instructed by your MD.   If you develop nausea and vomiting that is not controlled by your nausea medication, call the clinic.   BELOW ARE SYMPTOMS THAT SHOULD BE REPORTED IMMEDIATELY:  *FEVER GREATER THAN 100.5 F  *CHILLS WITH OR WITHOUT FEVER  NAUSEA AND VOMITING THAT IS NOT CONTROLLED WITH YOUR NAUSEA MEDICATION  *UNUSUAL SHORTNESS OF BREATH  *UNUSUAL BRUISING OR BLEEDING  TENDERNESS IN MOUTH AND THROAT WITH OR WITHOUT PRESENCE OF ULCERS  *URINARY PROBLEMS  *BOWEL PROBLEMS  UNUSUAL RASH Items with * indicate a potential emergency and should be followed up as soon as possible.  Feel free to call the clinic you have any questions or concerns. The clinic phone number is [phone].  Please show the [place] at check-in to the Emergency Department and triage nurse.  Nivolumab injection OPDIVO  What is this medicine? NIVOLUMAB (nye VOL ue mab) is a monoclonal antibody. It is used to treat melanoma, lung cancer, kidney cancer, and Hodgkin lymphoma. This medicine may be used for other purposes; ask your health care provider or pharmacist if you have questions. What should I tell my health care provider before I take this medicine? They need to know if you have any of these conditions: -diabetes -immune system problems -kidney disease -liver disease -lung disease -organ transplant -stomach or intestine problems -thyroid disease -an unusual or allergic reaction to nivolumab, other medicines, foods, dyes, or preservatives -pregnant or trying to get pregnant -breast-feeding How should I use this medicine? This medicine is for infusion into a vein. It is given by a health care professional in a hospital  or clinic setting. A special MedGuide will be given to you before each treatment. Be sure to read this information carefully each time. Talk to your pediatrician regarding the use of this medicine in children. Special care may be needed. Overdosage: If you think you have taken too much of this medicine contact a poison control center or emergency room at once. NOTE: This medicine is only for you. Do not share this medicine with others. What if I miss a dose? It is important not to miss your dose. Call your doctor or health care professional if you are unable to keep an appointment. What may interact with this medicine? Interactions have not been studied. Give your health care provider a list of all the medicines, herbs, non-prescription drugs, or dietary supplements you use. Also tell them if you smoke, drink alcohol, or use illegal drugs. Some items may interact with your medicine. This list may not describe all possible interactions. Give your health care provider a list of all the medicines, herbs, non-prescription drugs, or dietary supplements you use. Also tell them if you smoke, drink alcohol, or use illegal drugs. Some items may interact with your medicine. What should I watch for while using this medicine? This drug may make you feel generally unwell. Continue your course of treatment even though you feel ill unless your doctor tells you to stop. You may need blood work done while you are taking this medicine. Do not become pregnant while taking this medicine or for 5 months after stopping it. Women should inform their doctor if they wish to become pregnant or think they might be pregnant. There is a potential for serious  side effects to an unborn child. Talk to your health care professional or pharmacist for more information. Do not breast-feed an infant while taking this medicine. What side effects may I notice from receiving this medicine? Side effects that you should report to your doctor  or health care professional as soon as possible: -allergic reactions like skin rash, itching or hives, swelling of the face, lips, or tongue -black, tarry stools -blood in the urine -bloody or watery diarrhea -changes in vision -change in sex drive -changes in emotions or moods -chest pain -confusion -cough -decreased appetite -diarrhea -facial flushing -feeling faint or lightheaded -fever, chills -hair loss -hallucination, loss of contact with reality -headache -irritable -joint pain -loss of memory -muscle pain -muscle weakness -seizures -shortness of breath -signs and symptoms of high blood sugar such as dizziness; dry mouth; dry skin; fruity breath; nausea; stomach pain; increased hunger or thirst; increased urination -signs and symptoms of kidney injury like trouble passing urine or change in the amount of urine -signs and symptoms of liver injury like dark yellow or brown urine; general ill feeling or flu-like symptoms; light-colored stools; loss of appetite; nausea; right upper belly pain; unusually weak or tired; yellowing of the eyes or skin -stiff neck -swelling of the ankles, feet, hands -weight gain Side effects that usually do not require medical attention (report to your doctor or health care professional if they continue or are bothersome): -bone pain -constipation -tiredness -vomiting This list may not describe all possible side effects. Call your doctor for medical advice about side effects. You may report side effects to FDA at 1-800-FDA-1088. Where should I keep my medicine? This drug is given in a hospital or clinic and will not be stored at home. NOTE: This sheet is a summary. It may not cover all possible information. If you have questions about this medicine, talk to your doctor, pharmacist, or health care provider.    2016, Elsevier/Gold Standard. (2014-08-06 10:03:42)

## 2016-01-12 NOTE — Progress Notes (Signed)
  [facility] OFFICE PROGRESS NOTE   DIAGNOSIS: Progressive non-small cell lung cancer initially diagnosed as Stage IIIA (T2a, N2, M0) non-small cell lung cancer, squamous cell carcinoma presented with recurrence on the right upper lobe in addition to mediastinal lymphadenopathy diagnosed in September 2016.  PRIOR THERAPY: Concurrent chemoradiation with weekly carboplatin for AUC of 2 and paclitaxel 45 MG/M2. S/P 6 cycles.  CURRENT THERAPY: Immunotherapy with Nivolumab 240 MG IV every 2 weeks. First dose 12/29/2015.   INTERVAL HISTORY:   Marissa [name] returns as scheduled. She completed cycle 1 nivolumab 12/29/2015. She had no nausea or vomiting following treatment. No mouth sores. No diarrhea. No rash. Baseline dyspnea is unchanged. No cough or fever. On the way to the office today she developed nausea and vomited 1 time. She became dizzy and states that her vision was "off". She was not experiencing diplopia. She has had vertigo in the past and thinks these symptoms were very similar. Symptoms have completely resolved.  Objective:  Vital signs in last 24 hours:  Blood pressure 132/60, pulse 88, temperature 98.1 F (36.7 C), temperature source Oral, resp. rate 16, height '5\' 3"'$  (1.6 m), weight 125 lb 4.8 oz (56.8 kg), SpO2 99 %.    HEENT: Pupils equal round and reactive to light. Extraocular movements intact. Sclera anicteric. Oropharynx without thrush or ulceration. Resp: Faint wheezes anteriorly. No respiratory distress. Cardio: Irregular. GI: Abdomen soft and nontender. No hepatomegaly. Vascular: No leg edema. Neuro: Alert and oriented. Follows commands. Slightly hard of hearing. Motor strength 5 over 5. Finger to nose intact.  Skin: No rash.    Lab Results:  Lab Results  Component Value Date   WBC 11.3 (H) 01/12/2016   HGB 10.0 (L) 01/12/2016   HCT 32.3 (L) 01/12/2016   MCV 72.9 (L) 01/12/2016   PLT 291 01/12/2016   NEUTROABS 9.6 (H) 01/12/2016     Imaging:  No results found.  Medications: I have reviewed the patient's current medications.  Assessment/Plan: 1. Recurrent non-small cell lung cancer presented as a stage IIIA, squamous cell carcinoma September 2016; status post course of concurrent chemoradiation with weekly carboplatin and paclitaxel; CT scan of the chest 12/07/2015 showed evidence for disease progression. Nivolumab initiated 12/29/2015. 2. Left atrium thrombus. She continues Coumadin as prescribed by her cardiologist.   Disposition: Marissa [name] appears stable. She has completed 1 cycle of nivolumab. Plan to proceed with cycle 2 today as scheduled.  Earlier today she had an episode of nausea/vomiting, dizziness and visual disturbance. At the time of her visit symptoms had completely resolved. She reports vertigo in the past with symptoms today being similar. She understands to contact the office if symptoms recur.  She will return for a follow-up visit and cycle 3 nivolumab in 2 weeks. She will contact the office in the interim as outlined above or with any other problems.  Plan reviewed with Dr. Julien Nordmann.    Marissa [name] ANP/GNP-BC   01/12/2016  2:26 PM

## 2016-01-12 NOTE — Telephone Encounter (Signed)
GAVE RELATIVE AVS REPORT AND APPOINTMENTS FOR November AND December.   PER DTR 11/21 IS LAST TX.  ADDED LAB/FU W/MM FOR 12/4 AND 12/18 AS THESE DATES ARE IN CARE PLAN. DTR AWARE I WILL HOLD OFF ON ADDING ADDITIONAL TX FOR NOW UNTIL WE KNOW IF 11/21 IS ACTUALLY THE LAST.

## 2016-01-13 DIAGNOSIS — C3411 Malignant neoplasm of upper lobe, right bronchus or lung: Secondary | ICD-10-CM | POA: Diagnosis not present

## 2016-01-13 DIAGNOSIS — E119 Type 2 diabetes mellitus without complications: Secondary | ICD-10-CM | POA: Diagnosis not present

## 2016-01-13 DIAGNOSIS — I110 Hypertensive heart disease with heart failure: Secondary | ICD-10-CM | POA: Diagnosis not present

## 2016-01-13 DIAGNOSIS — J449 Chronic obstructive pulmonary disease, unspecified: Secondary | ICD-10-CM | POA: Diagnosis not present

## 2016-01-13 DIAGNOSIS — I5032 Chronic diastolic (congestive) heart failure: Secondary | ICD-10-CM | POA: Diagnosis not present

## 2016-01-13 DIAGNOSIS — I513 Intracardiac thrombosis, not elsewhere classified: Secondary | ICD-10-CM | POA: Diagnosis not present

## 2016-01-14 ENCOUNTER — Ambulatory Visit (INDEPENDENT_AMBULATORY_CARE_PROVIDER_SITE_OTHER): Payer: Medicare Other | Admitting: Cardiovascular Disease

## 2016-01-14 ENCOUNTER — Telehealth: Payer: Self-pay | Admitting: *Deleted

## 2016-01-14 DIAGNOSIS — I513 Intracardiac thrombosis, not elsewhere classified: Secondary | ICD-10-CM | POA: Diagnosis not present

## 2016-01-14 DIAGNOSIS — Z5181 Encounter for therapeutic drug level monitoring: Secondary | ICD-10-CM

## 2016-01-14 DIAGNOSIS — I110 Hypertensive heart disease with heart failure: Secondary | ICD-10-CM | POA: Diagnosis not present

## 2016-01-14 DIAGNOSIS — E119 Type 2 diabetes mellitus without complications: Secondary | ICD-10-CM | POA: Diagnosis not present

## 2016-01-14 DIAGNOSIS — I5032 Chronic diastolic (congestive) heart failure: Secondary | ICD-10-CM | POA: Diagnosis not present

## 2016-01-14 DIAGNOSIS — C3411 Malignant neoplasm of upper lobe, right bronchus or lung: Secondary | ICD-10-CM | POA: Diagnosis not present

## 2016-01-14 DIAGNOSIS — Z952 Presence of prosthetic heart valve: Secondary | ICD-10-CM

## 2016-01-14 DIAGNOSIS — J449 Chronic obstructive pulmonary disease, unspecified: Secondary | ICD-10-CM | POA: Diagnosis not present

## 2016-01-14 LAB — POCT INR: INR: 2.3

## 2016-01-14 NOTE — Telephone Encounter (Signed)
-----   Message from Owens Shark, NP sent at 01/13/2016  8:57 AM EDT ----- Please have her begin K Dur 20 meq po qd X 7.

## 2016-01-14 NOTE — Telephone Encounter (Signed)
Spoke with pt's daughter, she is taking potassium 20 meq daily. Instructed her to increase to BID for one week. She voiced understanding, stated she has enough at home.

## 2016-01-19 DIAGNOSIS — I513 Intracardiac thrombosis, not elsewhere classified: Secondary | ICD-10-CM | POA: Diagnosis not present

## 2016-01-19 DIAGNOSIS — J449 Chronic obstructive pulmonary disease, unspecified: Secondary | ICD-10-CM | POA: Diagnosis not present

## 2016-01-19 DIAGNOSIS — I5032 Chronic diastolic (congestive) heart failure: Secondary | ICD-10-CM | POA: Diagnosis not present

## 2016-01-19 DIAGNOSIS — I110 Hypertensive heart disease with heart failure: Secondary | ICD-10-CM | POA: Diagnosis not present

## 2016-01-19 DIAGNOSIS — E119 Type 2 diabetes mellitus without complications: Secondary | ICD-10-CM | POA: Diagnosis not present

## 2016-01-19 DIAGNOSIS — C3411 Malignant neoplasm of upper lobe, right bronchus or lung: Secondary | ICD-10-CM | POA: Diagnosis not present

## 2016-01-21 ENCOUNTER — Ambulatory Visit (INDEPENDENT_AMBULATORY_CARE_PROVIDER_SITE_OTHER): Payer: Medicare Other | Admitting: Cardiology

## 2016-01-21 DIAGNOSIS — J449 Chronic obstructive pulmonary disease, unspecified: Secondary | ICD-10-CM | POA: Diagnosis not present

## 2016-01-21 DIAGNOSIS — C3411 Malignant neoplasm of upper lobe, right bronchus or lung: Secondary | ICD-10-CM | POA: Diagnosis not present

## 2016-01-21 DIAGNOSIS — Z952 Presence of prosthetic heart valve: Secondary | ICD-10-CM

## 2016-01-21 DIAGNOSIS — I513 Intracardiac thrombosis, not elsewhere classified: Secondary | ICD-10-CM | POA: Diagnosis not present

## 2016-01-21 DIAGNOSIS — Z5181 Encounter for therapeutic drug level monitoring: Secondary | ICD-10-CM

## 2016-01-21 DIAGNOSIS — E119 Type 2 diabetes mellitus without complications: Secondary | ICD-10-CM | POA: Diagnosis not present

## 2016-01-21 DIAGNOSIS — I5032 Chronic diastolic (congestive) heart failure: Secondary | ICD-10-CM | POA: Diagnosis not present

## 2016-01-21 DIAGNOSIS — I110 Hypertensive heart disease with heart failure: Secondary | ICD-10-CM | POA: Diagnosis not present

## 2016-01-21 LAB — POCT INR: INR: 3

## 2016-01-25 ENCOUNTER — Other Ambulatory Visit: Payer: Self-pay

## 2016-01-25 ENCOUNTER — Encounter: Payer: Self-pay | Admitting: Nurse Practitioner

## 2016-01-25 ENCOUNTER — Other Ambulatory Visit (HOSPITAL_BASED_OUTPATIENT_CLINIC_OR_DEPARTMENT_OTHER): Payer: Medicare Other

## 2016-01-25 ENCOUNTER — Ambulatory Visit (HOSPITAL_BASED_OUTPATIENT_CLINIC_OR_DEPARTMENT_OTHER): Payer: Medicare Other | Admitting: Nurse Practitioner

## 2016-01-25 ENCOUNTER — Ambulatory Visit (HOSPITAL_BASED_OUTPATIENT_CLINIC_OR_DEPARTMENT_OTHER): Payer: Medicare Other

## 2016-01-25 VITALS — BP 115/54 | HR 80 | Temp 98.8°F | Resp 16 | Ht 63.0 in | Wt 123.8 lb

## 2016-01-25 DIAGNOSIS — C3411 Malignant neoplasm of upper lobe, right bronchus or lung: Secondary | ICD-10-CM

## 2016-01-25 DIAGNOSIS — Z5112 Encounter for antineoplastic immunotherapy: Secondary | ICD-10-CM

## 2016-01-25 DIAGNOSIS — R05 Cough: Secondary | ICD-10-CM | POA: Diagnosis not present

## 2016-01-25 DIAGNOSIS — I219 Acute myocardial infarction, unspecified: Secondary | ICD-10-CM | POA: Diagnosis not present

## 2016-01-25 DIAGNOSIS — E876 Hypokalemia: Secondary | ICD-10-CM | POA: Diagnosis not present

## 2016-01-25 DIAGNOSIS — C3431 Malignant neoplasm of lower lobe, right bronchus or lung: Secondary | ICD-10-CM

## 2016-01-25 LAB — COMPREHENSIVE METABOLIC PANEL
ALBUMIN: 3 g/dL — AB (ref 3.5–5.0)
ALK PHOS: 117 U/L (ref 40–150)
ALT: 12 U/L (ref 0–55)
ANION GAP: 11 meq/L (ref 3–11)
AST: 22 U/L (ref 5–34)
BUN: 18.1 mg/dL (ref 7.0–26.0)
CALCIUM: 9.5 mg/dL (ref 8.4–10.4)
CO2: 33 mEq/L — ABNORMAL HIGH (ref 22–29)
Chloride: 94 mEq/L — ABNORMAL LOW (ref 98–109)
Creatinine: 0.8 mg/dL (ref 0.6–1.1)
EGFR: 71 mL/min/{1.73_m2} — AB (ref >90)
Glucose: 85 mg/dl (ref 70–140)
POTASSIUM: 2.9 meq/L — AB (ref 3.5–5.1)
SODIUM: 139 meq/L (ref 136–145)
Total Bilirubin: 0.78 mg/dL (ref 0.20–1.20)
Total Protein: 7.1 g/dL (ref 6.4–8.3)

## 2016-01-25 LAB — CBC WITH DIFFERENTIAL/PLATELET
BASO%: 0.1 % (ref 0.0–2.0)
Basophils Absolute: 0 10*3/uL (ref 0.0–0.1)
EOS ABS: 0.1 10*3/uL (ref 0.0–0.5)
EOS%: 1 % (ref 0.0–7.0)
HCT: 31.9 % — ABNORMAL LOW (ref 34.8–46.6)
HGB: 9.7 g/dL — ABNORMAL LOW (ref 11.6–15.9)
LYMPH%: 7.5 % — AB (ref 14.0–49.7)
MCH: 22.9 pg — ABNORMAL LOW (ref 25.1–34.0)
MCHC: 30.4 g/dL — AB (ref 31.5–36.0)
MCV: 75.2 fL — AB (ref 79.5–101.0)
MONO#: 0.9 10*3/uL (ref 0.1–0.9)
MONO%: 8.5 % (ref 0.0–14.0)
NEUT%: 82.9 % — AB (ref 38.4–76.8)
NEUTROS ABS: 8.4 10*3/uL — AB (ref 1.5–6.5)
PLATELETS: 239 10*3/uL (ref 145–400)
RBC: 4.24 10*6/uL (ref 3.70–5.45)
RDW: 17 % — ABNORMAL HIGH (ref 11.2–14.5)
WBC: 10.1 10*3/uL (ref 3.9–10.3)
lymph#: 0.8 10*3/uL — ABNORMAL LOW (ref 0.9–3.3)

## 2016-01-25 MED ORDER — MIRTAZAPINE 30 MG PO TABS: 30.0000 mg | ORAL_TABLET | Freq: Every day | ORAL | 0 refills | Status: DC

## 2016-01-25 MED ORDER — SODIUM CHLORIDE 0.9 % IV SOLN
240.0000 mg | Freq: Once | INTRAVENOUS | Status: AC
  Administered 2016-01-25: 240 mg via INTRAVENOUS
  Filled 2016-01-25: qty 4

## 2016-01-25 MED ORDER — SODIUM CHLORIDE 0.9 % IV SOLN
Freq: Once | INTRAVENOUS | Status: AC
  Administered 2016-01-25: 16:00:00 via INTRAVENOUS

## 2016-01-25 NOTE — Progress Notes (Addendum)
[facility] OFFICE PROGRESS NOTE   DIAGNOSIS:Progressive non-small cell lung cancer initially diagnosed as Stage IIIA (T2a, N2, M0) non-small cell lung cancer, squamous cell carcinoma presented with recurrence on the right upper lobe in addition to mediastinal lymphadenopathy diagnosed in September 2016.  PRIOR THERAPY: Concurrent chemoradiation with weekly carboplatin for AUC of 2 and paclitaxel 45 MG/M2. S/P 6 cycles.  CURRENT THERAPY: Immunotherapy with Nivolumab 240 MG IV every 2 weeks.First dose 12/29/2015.   INTERVAL HISTORY:   Ms. Butz returns as scheduled. She completed cycle 2 nivolumab 01/12/2016. She denies nausea/vomiting. No mouth sores. No diarrhea. No rash. She feels like baseline dyspnea is slightly worse. Stable chronic cough. She reports a good appetite. She has had no further episodes of nausea/vomiting/dizziness. Her daughter is concerned she is depressed.  Objective:  Vital signs in last 24 hours:  Blood pressure (!) 115/54, pulse 80, temperature 98.8 F (37.1 C), temperature source Oral, resp. rate 16, height '5\' 3"'$  (1.6 m), weight 123 lb 12.8 oz (56.2 kg), SpO2 94 %.    HEENT: Mild white coating over tongue. Resp: Lungs clear bilaterally. Cardio: Regular rate and rhythm. GI: Abdomen soft and nontender. No hepatomegaly. Vascular: No leg edema.   Lab Results:  Lab Results  Component Value Date   WBC 10.1 01/25/2016   HGB 9.7 (L) 01/25/2016   HCT 31.9 (L) 01/25/2016   MCV 75.2 (L) 01/25/2016   PLT 239 01/25/2016   NEUTROABS 8.4 (H) 01/25/2016    Imaging:  No results found.  Medications: I have reviewed the patient's current medications.  Assessment/Plan: 1. Recurrent non-small cell lung cancer presented as a stage IIIA, squamous cell carcinoma September 2016; status post course of concurrent chemoradiation with weekly carboplatin and paclitaxel; CT scan of the chest 12/07/2015 showed evidence for disease progression.  Nivolumab initiated 12/29/2015. 2. Left atrium thrombus. She continues Coumadin as prescribed by her cardiologist   Disposition:Ms. Dombeck appears unchanged. She has completed 2 cycles of nivolumab. Plan to proceed with cycle 3 today as scheduled.  Her daughter is concerned she is depressed. Dr. Julien Nordmann recommends a trial of Remeron 30 mg at bedtime.  She continues to have hypokalemia. She takes furosemide 20 mg twice daily. She is currently taking Kdur 20 meq daily. She will increase to 20 meq twice daily.  She will return for a follow-up visit and cycle 4 nivolumab in 2 weeks. She will contact the office in the interim with any problems.  Patient seen with Dr. Julien Nordmann.    Ned Card ANP/GNP-BC   01/25/2016  2:28 PM   ADDENDUM: Hematology/Oncology Attending: I had a face to face encounter with the patient today. I recommended her care plan. This is a very pleasant 82 years old white female with recurrent non-small cell lung cancer. She is currently undergoing treatment with immunotherapy with Nivolumab status post 2 cycles. She tolerated the last cycle well with no significant complaints. I recommended for the patient to proceed with cycle #3 today as a scheduled. Her daughter mentions that the patient has been suffering from depression. We will start the patient on Remeron 30 mg by mouth daily at bedtime. She would come back for follow-up visit in 2 weeks for evaluation before starting cycle #4. The patient was advised to call immediately if she has any concerning symptoms in the interval.  Disclaimer: This note was dictated with voice recognition software. Similar sounding words can inadvertently be transcribed and may be missed upon review. Eilleen Kempf., MD 01/25/16

## 2016-01-25 NOTE — Patient Instructions (Signed)
[facility] Discharge Instructions for Patients Receiving Chemotherapy  Today you received the following chemotherapy agents Opdivo.  To help prevent nausea and vomiting after your treatment, we encourage you to take your nausea medication as directed.   If you develop nausea and vomiting that is not controlled by your nausea medication, call the clinic.   BELOW ARE SYMPTOMS THAT SHOULD BE REPORTED IMMEDIATELY:  *FEVER GREATER THAN 100.5 F  *CHILLS WITH OR WITHOUT FEVER  NAUSEA AND VOMITING THAT IS NOT CONTROLLED WITH YOUR NAUSEA MEDICATION  *UNUSUAL SHORTNESS OF BREATH  *UNUSUAL BRUISING OR BLEEDING  TENDERNESS IN MOUTH AND THROAT WITH OR WITHOUT PRESENCE OF ULCERS  *URINARY PROBLEMS  *BOWEL PROBLEMS  UNUSUAL RASH Items with * indicate a potential emergency and should be followed up as soon as possible.  Feel free to call the clinic you have any questions or concerns. The clinic phone number is [phone].  Please show the CHEMO ALERT CARD at check-in to the Emergency Department and triage nurse.    

## 2016-01-26 DIAGNOSIS — E119 Type 2 diabetes mellitus without complications: Secondary | ICD-10-CM | POA: Diagnosis not present

## 2016-01-26 DIAGNOSIS — J449 Chronic obstructive pulmonary disease, unspecified: Secondary | ICD-10-CM | POA: Diagnosis not present

## 2016-01-26 DIAGNOSIS — I110 Hypertensive heart disease with heart failure: Secondary | ICD-10-CM | POA: Diagnosis not present

## 2016-01-26 DIAGNOSIS — I5032 Chronic diastolic (congestive) heart failure: Secondary | ICD-10-CM | POA: Diagnosis not present

## 2016-01-26 DIAGNOSIS — I513 Intracardiac thrombosis, not elsewhere classified: Secondary | ICD-10-CM | POA: Diagnosis not present

## 2016-01-26 DIAGNOSIS — C3411 Malignant neoplasm of upper lobe, right bronchus or lung: Secondary | ICD-10-CM | POA: Diagnosis not present

## 2016-01-28 DIAGNOSIS — I5032 Chronic diastolic (congestive) heart failure: Secondary | ICD-10-CM | POA: Diagnosis not present

## 2016-01-28 DIAGNOSIS — J449 Chronic obstructive pulmonary disease, unspecified: Secondary | ICD-10-CM | POA: Diagnosis not present

## 2016-01-28 DIAGNOSIS — E119 Type 2 diabetes mellitus without complications: Secondary | ICD-10-CM | POA: Diagnosis not present

## 2016-01-28 DIAGNOSIS — I110 Hypertensive heart disease with heart failure: Secondary | ICD-10-CM | POA: Diagnosis not present

## 2016-01-28 DIAGNOSIS — C3411 Malignant neoplasm of upper lobe, right bronchus or lung: Secondary | ICD-10-CM | POA: Diagnosis not present

## 2016-01-28 DIAGNOSIS — I513 Intracardiac thrombosis, not elsewhere classified: Secondary | ICD-10-CM | POA: Diagnosis not present

## 2016-02-01 ENCOUNTER — Ambulatory Visit (INDEPENDENT_AMBULATORY_CARE_PROVIDER_SITE_OTHER): Payer: Medicare Other

## 2016-02-01 DIAGNOSIS — J449 Chronic obstructive pulmonary disease, unspecified: Secondary | ICD-10-CM | POA: Diagnosis not present

## 2016-02-01 DIAGNOSIS — C3411 Malignant neoplasm of upper lobe, right bronchus or lung: Secondary | ICD-10-CM | POA: Diagnosis not present

## 2016-02-01 DIAGNOSIS — Z5181 Encounter for therapeutic drug level monitoring: Secondary | ICD-10-CM

## 2016-02-01 DIAGNOSIS — I513 Intracardiac thrombosis, not elsewhere classified: Secondary | ICD-10-CM | POA: Diagnosis not present

## 2016-02-01 DIAGNOSIS — I110 Hypertensive heart disease with heart failure: Secondary | ICD-10-CM | POA: Diagnosis not present

## 2016-02-01 DIAGNOSIS — E119 Type 2 diabetes mellitus without complications: Secondary | ICD-10-CM | POA: Diagnosis not present

## 2016-02-01 DIAGNOSIS — Z952 Presence of prosthetic heart valve: Secondary | ICD-10-CM

## 2016-02-01 DIAGNOSIS — I5032 Chronic diastolic (congestive) heart failure: Secondary | ICD-10-CM | POA: Diagnosis not present

## 2016-02-01 LAB — POCT INR: INR: 3

## 2016-02-05 DIAGNOSIS — I110 Hypertensive heart disease with heart failure: Secondary | ICD-10-CM | POA: Diagnosis not present

## 2016-02-05 DIAGNOSIS — I513 Intracardiac thrombosis, not elsewhere classified: Secondary | ICD-10-CM | POA: Diagnosis not present

## 2016-02-05 DIAGNOSIS — E119 Type 2 diabetes mellitus without complications: Secondary | ICD-10-CM | POA: Diagnosis not present

## 2016-02-05 DIAGNOSIS — C3411 Malignant neoplasm of upper lobe, right bronchus or lung: Secondary | ICD-10-CM | POA: Diagnosis not present

## 2016-02-05 DIAGNOSIS — I5032 Chronic diastolic (congestive) heart failure: Secondary | ICD-10-CM | POA: Diagnosis not present

## 2016-02-05 DIAGNOSIS — J449 Chronic obstructive pulmonary disease, unspecified: Secondary | ICD-10-CM | POA: Diagnosis not present

## 2016-02-08 ENCOUNTER — Encounter: Payer: Self-pay | Admitting: Oncology

## 2016-02-08 ENCOUNTER — Ambulatory Visit (HOSPITAL_BASED_OUTPATIENT_CLINIC_OR_DEPARTMENT_OTHER): Payer: Medicare Other

## 2016-02-08 ENCOUNTER — Other Ambulatory Visit (HOSPITAL_BASED_OUTPATIENT_CLINIC_OR_DEPARTMENT_OTHER): Payer: Medicare Other

## 2016-02-08 ENCOUNTER — Telehealth: Payer: Self-pay | Admitting: Nurse Practitioner

## 2016-02-08 ENCOUNTER — Ambulatory Visit: Payer: Medicare Other

## 2016-02-08 ENCOUNTER — Ambulatory Visit (HOSPITAL_BASED_OUTPATIENT_CLINIC_OR_DEPARTMENT_OTHER): Payer: Medicare Other | Admitting: Oncology

## 2016-02-08 ENCOUNTER — Ambulatory Visit (INDEPENDENT_AMBULATORY_CARE_PROVIDER_SITE_OTHER): Payer: Medicare Other

## 2016-02-08 ENCOUNTER — Other Ambulatory Visit: Payer: Self-pay | Admitting: *Deleted

## 2016-02-08 VITALS — BP 135/98 | HR 138 | Temp 98.1°F | Resp 18 | Ht 63.0 in | Wt 127.4 lb

## 2016-02-08 VITALS — BP 151/73 | HR 123 | Resp 22

## 2016-02-08 DIAGNOSIS — Z5112 Encounter for antineoplastic immunotherapy: Secondary | ICD-10-CM | POA: Diagnosis present

## 2016-02-08 DIAGNOSIS — C3431 Malignant neoplasm of lower lobe, right bronchus or lung: Secondary | ICD-10-CM

## 2016-02-08 DIAGNOSIS — Z952 Presence of prosthetic heart valve: Secondary | ICD-10-CM

## 2016-02-08 DIAGNOSIS — E876 Hypokalemia: Secondary | ICD-10-CM

## 2016-02-08 DIAGNOSIS — J449 Chronic obstructive pulmonary disease, unspecified: Secondary | ICD-10-CM | POA: Diagnosis not present

## 2016-02-08 DIAGNOSIS — I219 Acute myocardial infarction, unspecified: Secondary | ICD-10-CM

## 2016-02-08 DIAGNOSIS — C3411 Malignant neoplasm of upper lobe, right bronchus or lung: Secondary | ICD-10-CM

## 2016-02-08 DIAGNOSIS — I110 Hypertensive heart disease with heart failure: Secondary | ICD-10-CM | POA: Diagnosis not present

## 2016-02-08 DIAGNOSIS — R05 Cough: Secondary | ICD-10-CM

## 2016-02-08 DIAGNOSIS — E119 Type 2 diabetes mellitus without complications: Secondary | ICD-10-CM | POA: Diagnosis not present

## 2016-02-08 DIAGNOSIS — I513 Intracardiac thrombosis, not elsewhere classified: Secondary | ICD-10-CM | POA: Diagnosis not present

## 2016-02-08 DIAGNOSIS — Z5181 Encounter for therapeutic drug level monitoring: Secondary | ICD-10-CM

## 2016-02-08 DIAGNOSIS — I5032 Chronic diastolic (congestive) heart failure: Secondary | ICD-10-CM | POA: Diagnosis not present

## 2016-02-08 LAB — CBC WITH DIFFERENTIAL/PLATELET
BASO%: 0.7 % (ref 0.0–2.0)
Basophils Absolute: 0.1 10*3/uL (ref 0.0–0.1)
EOS ABS: 0.2 10*3/uL (ref 0.0–0.5)
EOS%: 1.7 % (ref 0.0–7.0)
HEMATOCRIT: 31.9 % — AB (ref 34.8–46.6)
HEMOGLOBIN: 9.8 g/dL — AB (ref 11.6–15.9)
LYMPH%: 9.3 % — ABNORMAL LOW (ref 14.0–49.7)
MCH: 22.3 pg — ABNORMAL LOW (ref 25.1–34.0)
MCHC: 30.7 g/dL — ABNORMAL LOW (ref 31.5–36.0)
MCV: 72.5 fL — AB (ref 79.5–101.0)
MONO#: 1.2 10*3/uL — AB (ref 0.1–0.9)
MONO%: 8.7 % (ref 0.0–14.0)
NEUT%: 79.6 % — ABNORMAL HIGH (ref 38.4–76.8)
NEUTROS ABS: 10.7 10*3/uL — AB (ref 1.5–6.5)
PLATELETS: 319 10*3/uL (ref 145–400)
RBC: 4.4 10*6/uL (ref 3.70–5.45)
RDW: 17.8 % — AB (ref 11.2–14.5)
WBC: 13.5 10*3/uL — AB (ref 3.9–10.3)
lymph#: 1.2 10*3/uL (ref 0.9–3.3)

## 2016-02-08 LAB — COMPREHENSIVE METABOLIC PANEL
ALBUMIN: 2.9 g/dL — AB (ref 3.5–5.0)
ALK PHOS: 120 U/L (ref 40–150)
ALT: 13 U/L (ref 0–55)
ANION GAP: 11 meq/L (ref 3–11)
AST: 22 U/L (ref 5–34)
BILIRUBIN TOTAL: 0.55 mg/dL (ref 0.20–1.20)
BUN: 18.3 mg/dL (ref 7.0–26.0)
CALCIUM: 9.5 mg/dL (ref 8.4–10.4)
CO2: 31 mEq/L — ABNORMAL HIGH (ref 22–29)
Chloride: 97 mEq/L — ABNORMAL LOW (ref 98–109)
Creatinine: 0.7 mg/dL (ref 0.6–1.1)
EGFR: 79 mL/min/{1.73_m2} — AB (ref >90)
GLUCOSE: 52 mg/dL — AB (ref 70–140)
Potassium: 3.9 mEq/L (ref 3.5–5.1)
Sodium: 140 mEq/L (ref 136–145)
TOTAL PROTEIN: 7.1 g/dL (ref 6.4–8.3)

## 2016-02-08 LAB — POCT INR: INR: 2.8

## 2016-02-08 LAB — WHOLE BLOOD GLUCOSE
GLUCOSE: 177 mg/dL — AB (ref 70–100)
HRS PC: 0.5 Hours

## 2016-02-08 MED ORDER — MIRTAZAPINE 30 MG PO TABS
30.0000 mg | ORAL_TABLET | Freq: Every day | ORAL | 0 refills | Status: DC
Start: 2016-02-08 — End: 2016-05-23

## 2016-02-08 MED ORDER — NIVOLUMAB CHEMO INJECTION 100 MG/10ML
240.0000 mg | Freq: Once | INTRAVENOUS | Status: AC
  Administered 2016-02-08: 240 mg via INTRAVENOUS
  Filled 2016-02-08: qty 4

## 2016-02-08 MED ORDER — SODIUM CHLORIDE 0.9 % IV SOLN
Freq: Once | INTRAVENOUS | Status: AC
  Administered 2016-02-08: 14:00:00 via INTRAVENOUS

## 2016-02-08 NOTE — Progress Notes (Signed)
  [name] OFFICE PROGRESS NOTE   DIAGNOSIS:Progressive non-small cell lung cancer initially diagnosed as Stage IIIA (T2a, N2, M0) non-small cell lung cancer, squamous cell carcinoma presented with recurrence on the right upper lobe in addition to mediastinal lymphadenopathy diagnosed in September 2016.  PRIOR THERAPY: Concurrent chemoradiation with weekly carboplatin for AUC of 2 and paclitaxel 45 MG/M2. S/P 6 cycles.  CURRENT THERAPY: Immunotherapy with Nivolumab 240 MG IV every 2 weeks.Status post 3 cycles .   INTERVAL HISTORY:   Marissa [name] returns as scheduled. She is scheduled for cycle 4 of her Nivolumab today. She denies nausea/vomiting. No mouth sores. No diarrhea. No rash. She feels like baseline dyspnea is slightly worse. Wears O2 at 4 L/m. Stable chronic cough. She reports a good appetite. She has had no further episodes of nausea/vomiting/dizziness. Her daughter is concerned she is depressed.  Objective:  Vital signs in last 24 hours:  Blood pressure (!) 135/98, pulse (!) 138, temperature 98.1 F (36.7 C), temperature source Oral, resp. rate 18, height '5\' 3"'$  (1.6 m), weight 127 lb 6.4 oz (57.8 kg), SpO2 91 %.    HEENT: Mild white coating over tongue. Resp: Lungs clear bilaterally. Cardio: Regular rate and rhythm. GI: Abdomen soft and nontender. No hepatomegaly. Vascular: No leg edema.   Lab Results:  Lab Results  Component Value Date   WBC 13.5 (H) 02/08/2016   HGB 9.8 (L) 02/08/2016   HCT 31.9 (L) 02/08/2016   MCV 72.5 (L) 02/08/2016   PLT 319 02/08/2016   NEUTROABS 10.7 (H) 02/08/2016    Imaging:  No results found.  Medications: I have reviewed the patient's current medications.  Assessment/Plan: 1. Recurrent non-small cell lung cancer presented as a stage IIIA, squamous cell carcinoma September 2016; status post course of concurrent chemoradiation with weekly carboplatin and paclitaxel; CT scan of the chest 12/07/2015 showed  evidence for disease progression. Nivolumab initiated 12/29/2015. 2. Left atrium thrombus. She continues Coumadin as prescribed by her cardiologist   Disposition:[name] appears unchanged. She has completed 3 cycles of nivolumab. Plan to proceed with cycle 4 today as scheduled.  She will have a restaging CT scan of the chest prior to her fifth cycle of Nivolumab. This order was placed today.  Glucose is low today. She is asymptomatic. The patient was given orange juice and food to eat. Repeat CBG was 177. She was encouraged to eat small frequent meals.  Her hypokalemia has improved. She remains on Lasix and will continue potassium chloride 20 mEq twice a day.  She will return for a follow-up visit and cycle 5 nivolumab in 2 weeks. She will contact the office in the interim with any problems.   Mikey Bussing, DNP, AGPCNP-BC, OACNP   02/08/2016  1:18 PM

## 2016-02-08 NOTE — Telephone Encounter (Signed)
This provider was asked to come and evaluate patient after she received her Opdivo immunotherapy today.  Patient had already been deaccessed; and was ready for discharge when she has to go the bathroom prior to leaving the [facility].  Patient is chronically on 4 L O2 via nasal cannula.  Patient states that she became short of breath upon her walk to the bathroom.  She felt anxious and smokes concerned that she would not be well enough to leave the cancer Center.  This provider came immediately to assess the patient; and patient stated that she was ready to go home.  When questioned if patient had any new symptoms whatsoever-patient denied all.  She stated that she is chronically short of breath due to her COPD and her lung cancer diagnosis.  She states that she gets short of breath when she tries to ambulate; and feels that her shortness of breath is stable at the current time.  She denies any other new symptoms whatsoever.  Patient was advised to call/return to go directly to the emergency department for any worsening symptoms whatsoever.

## 2016-02-08 NOTE — Progress Notes (Signed)
Patient blood glucose 52. Patient states she is a little shaky. Orange juice provided. Patient daughter brought sandwich from [employer]. Patient drank all of the orange juice and coke and ate all the sandwich. Mikey Bussing, NP made aware. Recheck blood glucose 15 minutes before expected patient departure. Patient's pulse 138. Pulse rechecked and is 103. Ok to treat per Mikey Bussing, NP. Patient and daughter verbalized understanding above mentioned plan to proceed with treatment and recheck blood glucose before departure.   Blood glucose resulted from lab  177 mg/dl. Patient shakiness resolved.  Mikey Bussing, NP notified.   Patient accompanied to bathroom. Upon getting her in wheelchair patient stated she felt worse. Upon further questioning patient stated she was concerned her breathing was worse. Vital signs obtained. Mikey Bussing, NP notified. This RN directed to Selena Lesser, NP. Selena Lesser, NP notified of patients symptoms and concerns and agreed to see patient in infusion.  Selena Lesser, NP chairside. Patient discharged via wheelchair with her daughter accompanying.

## 2016-02-08 NOTE — Patient Instructions (Signed)
[facility] Discharge Instructions for Patients Receiving Chemotherapy  Today you received the following chemotherapy agents Opdivo.  To help prevent nausea and vomiting after your treatment, we encourage you to take your nausea medication as directed.   If you develop nausea and vomiting that is not controlled by your nausea medication, call the clinic.   BELOW ARE SYMPTOMS THAT SHOULD BE REPORTED IMMEDIATELY:  *FEVER GREATER THAN 100.5 F  *CHILLS WITH OR WITHOUT FEVER  NAUSEA AND VOMITING THAT IS NOT CONTROLLED WITH YOUR NAUSEA MEDICATION  *UNUSUAL SHORTNESS OF BREATH  *UNUSUAL BRUISING OR BLEEDING  TENDERNESS IN MOUTH AND THROAT WITH OR WITHOUT PRESENCE OF ULCERS  *URINARY PROBLEMS  *BOWEL PROBLEMS  UNUSUAL RASH Items with * indicate a potential emergency and should be followed up as soon as possible.  Feel free to call the clinic you have any questions or concerns. The clinic phone number is [phone].  Please show the CHEMO ALERT CARD at check-in to the Emergency Department and triage nurse.    

## 2016-02-09 DIAGNOSIS — J449 Chronic obstructive pulmonary disease, unspecified: Secondary | ICD-10-CM | POA: Diagnosis not present

## 2016-02-09 DIAGNOSIS — I513 Intracardiac thrombosis, not elsewhere classified: Secondary | ICD-10-CM | POA: Diagnosis not present

## 2016-02-09 DIAGNOSIS — E119 Type 2 diabetes mellitus without complications: Secondary | ICD-10-CM | POA: Diagnosis not present

## 2016-02-09 DIAGNOSIS — I110 Hypertensive heart disease with heart failure: Secondary | ICD-10-CM | POA: Diagnosis not present

## 2016-02-09 DIAGNOSIS — I5032 Chronic diastolic (congestive) heart failure: Secondary | ICD-10-CM | POA: Diagnosis not present

## 2016-02-09 DIAGNOSIS — C3411 Malignant neoplasm of upper lobe, right bronchus or lung: Secondary | ICD-10-CM | POA: Diagnosis not present

## 2016-02-10 DIAGNOSIS — I110 Hypertensive heart disease with heart failure: Secondary | ICD-10-CM | POA: Diagnosis not present

## 2016-02-10 DIAGNOSIS — J449 Chronic obstructive pulmonary disease, unspecified: Secondary | ICD-10-CM | POA: Diagnosis not present

## 2016-02-10 DIAGNOSIS — E119 Type 2 diabetes mellitus without complications: Secondary | ICD-10-CM | POA: Diagnosis not present

## 2016-02-10 DIAGNOSIS — C3411 Malignant neoplasm of upper lobe, right bronchus or lung: Secondary | ICD-10-CM | POA: Diagnosis not present

## 2016-02-10 DIAGNOSIS — I513 Intracardiac thrombosis, not elsewhere classified: Secondary | ICD-10-CM | POA: Diagnosis not present

## 2016-02-10 DIAGNOSIS — I5032 Chronic diastolic (congestive) heart failure: Secondary | ICD-10-CM | POA: Diagnosis not present

## 2016-02-12 DIAGNOSIS — C3411 Malignant neoplasm of upper lobe, right bronchus or lung: Secondary | ICD-10-CM | POA: Diagnosis not present

## 2016-02-12 DIAGNOSIS — I5032 Chronic diastolic (congestive) heart failure: Secondary | ICD-10-CM | POA: Diagnosis not present

## 2016-02-12 DIAGNOSIS — E119 Type 2 diabetes mellitus without complications: Secondary | ICD-10-CM | POA: Diagnosis not present

## 2016-02-12 DIAGNOSIS — I110 Hypertensive heart disease with heart failure: Secondary | ICD-10-CM | POA: Diagnosis not present

## 2016-02-12 DIAGNOSIS — J449 Chronic obstructive pulmonary disease, unspecified: Secondary | ICD-10-CM | POA: Diagnosis not present

## 2016-02-12 DIAGNOSIS — I513 Intracardiac thrombosis, not elsewhere classified: Secondary | ICD-10-CM | POA: Diagnosis not present

## 2016-02-15 ENCOUNTER — Ambulatory Visit (INDEPENDENT_AMBULATORY_CARE_PROVIDER_SITE_OTHER): Payer: Medicare Other | Admitting: Cardiovascular Disease

## 2016-02-15 DIAGNOSIS — I513 Intracardiac thrombosis, not elsewhere classified: Secondary | ICD-10-CM | POA: Diagnosis not present

## 2016-02-15 DIAGNOSIS — Z7984 Long term (current) use of oral hypoglycemic drugs: Secondary | ICD-10-CM | POA: Diagnosis not present

## 2016-02-15 DIAGNOSIS — J449 Chronic obstructive pulmonary disease, unspecified: Secondary | ICD-10-CM | POA: Diagnosis not present

## 2016-02-15 DIAGNOSIS — Z952 Presence of prosthetic heart valve: Secondary | ICD-10-CM

## 2016-02-15 DIAGNOSIS — Z5181 Encounter for therapeutic drug level monitoring: Secondary | ICD-10-CM

## 2016-02-15 DIAGNOSIS — I2510 Atherosclerotic heart disease of native coronary artery without angina pectoris: Secondary | ICD-10-CM | POA: Diagnosis not present

## 2016-02-15 DIAGNOSIS — E119 Type 2 diabetes mellitus without complications: Secondary | ICD-10-CM | POA: Diagnosis not present

## 2016-02-15 DIAGNOSIS — I482 Chronic atrial fibrillation: Secondary | ICD-10-CM | POA: Diagnosis not present

## 2016-02-15 DIAGNOSIS — C3411 Malignant neoplasm of upper lobe, right bronchus or lung: Secondary | ICD-10-CM | POA: Diagnosis not present

## 2016-02-15 DIAGNOSIS — Z7951 Long term (current) use of inhaled steroids: Secondary | ICD-10-CM | POA: Diagnosis not present

## 2016-02-15 DIAGNOSIS — Z7901 Long term (current) use of anticoagulants: Secondary | ICD-10-CM | POA: Diagnosis not present

## 2016-02-15 DIAGNOSIS — I110 Hypertensive heart disease with heart failure: Secondary | ICD-10-CM | POA: Diagnosis not present

## 2016-02-15 DIAGNOSIS — I5032 Chronic diastolic (congestive) heart failure: Secondary | ICD-10-CM | POA: Diagnosis not present

## 2016-02-15 DIAGNOSIS — Z951 Presence of aortocoronary bypass graft: Secondary | ICD-10-CM | POA: Diagnosis not present

## 2016-02-15 LAB — POCT INR: INR: 2.2

## 2016-02-22 ENCOUNTER — Ambulatory Visit (HOSPITAL_BASED_OUTPATIENT_CLINIC_OR_DEPARTMENT_OTHER): Payer: Medicare Other | Admitting: Internal Medicine

## 2016-02-22 ENCOUNTER — Ambulatory Visit (HOSPITAL_COMMUNITY)
Admission: RE | Admit: 2016-02-22 | Discharge: 2016-02-22 | Disposition: A | Discharge status: Home / Self Care | Payer: Medicare Other | Source: Ambulatory Visit | Attending: Oncology | Admitting: Oncology

## 2016-02-22 ENCOUNTER — Other Ambulatory Visit: Payer: Self-pay | Admitting: *Deleted

## 2016-02-22 ENCOUNTER — Telehealth: Payer: Self-pay | Admitting: Internal Medicine

## 2016-02-22 ENCOUNTER — Ambulatory Visit (HOSPITAL_BASED_OUTPATIENT_CLINIC_OR_DEPARTMENT_OTHER): Payer: Medicare Other

## 2016-02-22 ENCOUNTER — Encounter: Payer: Self-pay | Admitting: Internal Medicine

## 2016-02-22 ENCOUNTER — Other Ambulatory Visit (HOSPITAL_BASED_OUTPATIENT_CLINIC_OR_DEPARTMENT_OTHER): Payer: Medicare Other

## 2016-02-22 VITALS — HR 104

## 2016-02-22 VITALS — BP 150/79 | HR 119 | Temp 98.3°F | Resp 18 | Ht 63.0 in | Wt 122.0 lb

## 2016-02-22 DIAGNOSIS — R590 Localized enlarged lymph nodes: Secondary | ICD-10-CM | POA: Diagnosis not present

## 2016-02-22 DIAGNOSIS — J90 Pleural effusion, not elsewhere classified: Secondary | ICD-10-CM | POA: Insufficient documentation

## 2016-02-22 DIAGNOSIS — F329 Major depressive disorder, single episode, unspecified: Secondary | ICD-10-CM

## 2016-02-22 DIAGNOSIS — I700 Atherosclerosis of aorta: Secondary | ICD-10-CM | POA: Insufficient documentation

## 2016-02-22 DIAGNOSIS — Z5112 Encounter for antineoplastic immunotherapy: Secondary | ICD-10-CM | POA: Diagnosis present

## 2016-02-22 DIAGNOSIS — C3411 Malignant neoplasm of upper lobe, right bronchus or lung: Secondary | ICD-10-CM

## 2016-02-22 DIAGNOSIS — R630 Anorexia: Secondary | ICD-10-CM

## 2016-02-22 DIAGNOSIS — R000 Tachycardia, unspecified: Secondary | ICD-10-CM

## 2016-02-22 DIAGNOSIS — I2510 Atherosclerotic heart disease of native coronary artery without angina pectoris: Secondary | ICD-10-CM | POA: Diagnosis not present

## 2016-02-22 DIAGNOSIS — Z951 Presence of aortocoronary bypass graft: Secondary | ICD-10-CM | POA: Insufficient documentation

## 2016-02-22 DIAGNOSIS — C3431 Malignant neoplasm of lower lobe, right bronchus or lung: Secondary | ICD-10-CM

## 2016-02-22 DIAGNOSIS — I517 Cardiomegaly: Secondary | ICD-10-CM | POA: Insufficient documentation

## 2016-02-22 DIAGNOSIS — R918 Other nonspecific abnormal finding of lung field: Secondary | ICD-10-CM | POA: Diagnosis not present

## 2016-02-22 DIAGNOSIS — R634 Abnormal weight loss: Secondary | ICD-10-CM

## 2016-02-22 DIAGNOSIS — E860 Dehydration: Secondary | ICD-10-CM

## 2016-02-22 DIAGNOSIS — R0600 Dyspnea, unspecified: Secondary | ICD-10-CM | POA: Diagnosis not present

## 2016-02-22 HISTORY — DX: Abnormal weight loss: R63.4

## 2016-02-22 HISTORY — DX: Dehydration: E86.0

## 2016-02-22 LAB — CBC WITH DIFFERENTIAL/PLATELET
BASO%: 0.1 % (ref 0.0–2.0)
BASOS ABS: 0 10*3/uL (ref 0.0–0.1)
EOS%: 0.9 % (ref 0.0–7.0)
Eosinophils Absolute: 0.1 10*3/uL (ref 0.0–0.5)
HEMATOCRIT: 31.6 % — AB (ref 34.8–46.6)
HGB: 9 g/dL — ABNORMAL LOW (ref 11.6–15.9)
LYMPH#: 0.3 10*3/uL — AB (ref 0.9–3.3)
LYMPH%: 2.4 % — ABNORMAL LOW (ref 14.0–49.7)
MCH: 22 pg — AB (ref 25.1–34.0)
MCHC: 28.5 g/dL — AB (ref 31.5–36.0)
MCV: 77.3 fL — ABNORMAL LOW (ref 79.5–101.0)
MONO#: 0.9 10*3/uL (ref 0.1–0.9)
MONO%: 7.9 % (ref 0.0–14.0)
NEUT#: 9.7 10*3/uL — ABNORMAL HIGH (ref 1.5–6.5)
NEUT%: 88.7 % — AB (ref 38.4–76.8)
PLATELETS: 234 10*3/uL (ref 145–400)
RBC: 4.09 10*6/uL (ref 3.70–5.45)
RDW: 17.2 % — ABNORMAL HIGH (ref 11.2–14.5)
WBC: 11 10*3/uL — ABNORMAL HIGH (ref 3.9–10.3)

## 2016-02-22 LAB — COMPREHENSIVE METABOLIC PANEL
ALT: 12 U/L (ref 0–55)
ANION GAP: 12 meq/L — AB (ref 3–11)
AST: 20 U/L (ref 5–34)
Albumin: 2.9 g/dL — ABNORMAL LOW (ref 3.5–5.0)
Alkaline Phosphatase: 124 U/L (ref 40–150)
BUN: 13.6 mg/dL (ref 7.0–26.0)
CALCIUM: 9.3 mg/dL (ref 8.4–10.4)
CHLORIDE: 95 meq/L — AB (ref 98–109)
CO2: 34 mEq/L — ABNORMAL HIGH (ref 22–29)
CREATININE: 0.8 mg/dL (ref 0.6–1.1)
EGFR: 71 mL/min/{1.73_m2} — ABNORMAL LOW (ref >90)
Glucose: 169 mg/dl — ABNORMAL HIGH (ref 70–140)
POTASSIUM: 3.7 meq/L (ref 3.5–5.1)
Sodium: 140 mEq/L (ref 136–145)
Total Bilirubin: 0.62 mg/dL (ref 0.20–1.20)
Total Protein: 7.1 g/dL (ref 6.4–8.3)

## 2016-02-22 MED ORDER — SODIUM CHLORIDE 0.9 % IV SOLN
240.0000 mg | Freq: Once | INTRAVENOUS | Status: AC
  Administered 2016-02-22: 240 mg via INTRAVENOUS
  Filled 2016-02-22: qty 20

## 2016-02-22 MED ORDER — SODIUM CHLORIDE 0.9 % IV SOLN: Freq: Once | INTRAVENOUS | Status: DC

## 2016-02-22 MED ORDER — SODIUM CHLORIDE 0.9 % IV SOLN: INTRAVENOUS | Status: AC

## 2016-02-22 MED ORDER — IOPAMIDOL (ISOVUE-300) INJECTION 61%
100.0000 mL | Freq: Once | INTRAVENOUS | Status: AC | PRN
  Administered 2016-02-22: 75 mL via INTRAVENOUS

## 2016-02-22 NOTE — Patient Instructions (Signed)
[facility] Discharge Instructions for Patients Receiving Chemotherapy  Today you received the following chemotherapy agents Opdivo.  To help prevent nausea and vomiting after your treatment, we encourage you to take your nausea medication as directed.   If you develop nausea and vomiting that is not controlled by your nausea medication, call the clinic.   BELOW ARE SYMPTOMS THAT SHOULD BE REPORTED IMMEDIATELY:  *FEVER GREATER THAN 100.5 F  *CHILLS WITH OR WITHOUT FEVER  NAUSEA AND VOMITING THAT IS NOT CONTROLLED WITH YOUR NAUSEA MEDICATION  *UNUSUAL SHORTNESS OF BREATH  *UNUSUAL BRUISING OR BLEEDING  TENDERNESS IN MOUTH AND THROAT WITH OR WITHOUT PRESENCE OF ULCERS  *URINARY PROBLEMS  *BOWEL PROBLEMS  UNUSUAL RASH Items with * indicate a potential emergency and should be followed up as soon as possible.  Feel free to call the clinic you have any questions or concerns. The clinic phone number is [phone].  Please show the CHEMO ALERT CARD at check-in to the Emergency Department and triage nurse.    

## 2016-02-22 NOTE — Telephone Encounter (Signed)
Appointments scheduled per 12/4 LOS. Patient had to leave during scheduling. Appointments mailed out to patient.

## 2016-02-22 NOTE — Progress Notes (Signed)
[facility] [phone]   [phone]  OFFICE PROGRESS NOTE  [name], MD [address] [place] [address]  DIAGNOSIS: Progressive non-small cell lung cancer initially diagnosed as a stage IIIA  (T2, N2, M0) squamous cell carcinoma presented with recurrence in the right upper lobe as well as mediastinal lymphadenopathy diagnosed in September 2016.  PRIOR THERAPY: Concurrent chemoradiation with weekly carboplatin for AUC of 2 and paclitaxel 45 MG/M2, status post 6 cycles.  CURRENT THERAPY: Immunotherapy with Nivolumab to 40 mg IV every 2 weeks status post 4 cycles. First dose was given on 12/29/2015.  INTERVAL HISTORY: Marissa [name] 82 y.o. female returns to the clinic today for follow-up visit accompanied by her daughter. The patient is not feeling well and complains of lack of appetite and weight loss, she also has shortness breath and currently on 4 L of oxygen with oxygen saturation 94% on the for liter. She is tachycardic has lack of appetite and poor by mouth intake. She tolerated the last treatment with Nivolumab well. She has no skin rash or diarrhea. She denied having any current chest pain but continues to have cough with no hemoptysis. She has no nausea or vomiting. She has no headache or visual changes. The patient was supposed to have repeat CT scan of the chest before her visit but unfortunately it is scheduled to be done after the visit today. She is here today for evaluation before starting cycle #5 of her treatment.  MEDICAL HISTORY: Past Medical History:  Diagnosis Date  . A-fib ([place])   . Aortic stenosis    (s/p AVR w/ 21 mm porcine valve 03/2011  . Aortic valve disorders   . Arthritis   . CAD (coronary artery disease)    s/p CABG 03/2011  . CHF (congestive heart failure) ([place])   . COPD (chronic obstructive pulmonary disease) ([place])   . Diabetes mellitus without complication ([place])   . Dysrhythmia   .  Encounter for antineoplastic immunotherapy 12/21/2015  . Full code status 12/22/2014  . Full code status 12/22/2014  . GERD (gastroesophageal reflux disease)    takes nexium prn  . History of blood transfusion    Rheumatic Fever  . HTN (hypertension)   . Hypercholesteremia   . Hypothyroidism   . Lung cancer ([place])   . Mastoiditis   . Mitral valve disease    s/p MVR 03/2011 w/ 27 mm porcine valve  . Myocardial infarction    "slight"  . Osteopenia   . Pneumonia   . Pre-diabetes   . RLS (restless legs syndrome)   . Shortness of breath    with exertion    ALLERGIES:  is allergic to prozac [fluoxetine hcl].  MEDICATIONS:  Current Outpatient Prescriptions  Medication Sig Dispense Refill  . acetaminophen (TYLENOL) 325 MG tablet Take 2 tablets (650 mg total) by mouth every 6 (six) hours as needed for mild pain (or Fever >/= 101).    [name] albuterol (PROVENTIL) (2.5 MG/3ML) 0.083% nebulizer solution Inhale 3 mLs into the lungs 3 (three) times daily.    [name] alendronate (FOSAMAX) 70 MG tablet Take 70 mg by mouth once a week.     [name] aspirin EC 81 MG tablet Take 81 mg by mouth every morning.    [name] atorvastatin (LIPITOR) 40 MG tablet Take 40 mg by mouth daily. For HLD    . CALCIUM CARBONATE PO Take 1 tablet by mouth 2 (two) times daily.    [name]  esomeprazole (NEXIUM) 40 MG capsule Take 40 mg by mouth daily as needed (for acid reflux).     . fluticasone-salmeterol (ADVAIR HFA) 115-21 MCG/ACT inhaler Inhale 2 puffs into the lungs 2 (two) times daily.     . furosemide (LASIX) 20 MG tablet Take 1 tablet (20 mg total) by mouth 2 (two) times daily. 30 tablet 0  . glimepiride (AMARYL) 1 MG tablet Take 1 mg by mouth daily with breakfast.    . Ipratropium-Albuterol (COMBIVENT) 20-100 MCG/ACT AERS respimat Inhale 1 puff into the lungs every 6 (six) hours as needed for wheezing or shortness of breath.     [name] ipratropium-albuterol (DUONEB) 0.5-2.5 (3) MG/3ML SOLN Take 3 mLs by nebulization every 6 (six) hours as needed  (for shortness of breath).     [name] levothyroxine (SYNTHROID, LEVOTHROID) 100 MCG tablet Take 100 mcg by mouth daily before breakfast.     . mirtazapine (REMERON) 30 MG tablet Take 1 tablet (30 mg total) by mouth at bedtime. 30 tablet 0  . Multiple Vitamin (MULTIVITAMIN WITH MINERALS) TABS tablet Take 1 tablet by mouth daily.    . potassium chloride SA (K-DUR,KLOR-CON) 20 MEQ tablet Take 1 tablet (20 mEq total) by mouth daily. 7 tablet 0  . rOPINIRole (REQUIP) 4 MG tablet Take 4 mg by mouth at bedtime.    . TOPROL XL 50 MG 24 hr tablet TAKE 1 TABLET EVERY EVENING (DISCONTINUE CARVEDIOLOL PRESCRIPTION) 90 tablet 3  . vitamin C (ASCORBIC ACID) 500 MG tablet Take 500 mg by mouth daily.    [name] warfarin (COUMADIN) 1 MG tablet Take 2.5 tablets daily or as directed by Coumadin Clinic 240 tablet 0   No current facility-administered medications for this visit.     SURGICAL HISTORY:  Past Surgical History:  Procedure Laterality Date  . AORTIC VALVE REPLACEMENT    . COMPRESSION HIP SCREW Left 12/26/2014   Procedure: ORIF LEFT HIP INTERTROCHANTRIC FRACTURE;  Surgeon: Frederik Pear, MD;  Location: [facility];  Service: Orthopedics;  Laterality: Left;  . CORONARY ARTERY BYPASS GRAFT  03/2011   1990's also  . LAPAROSCOPIC CHOLECYSTECTOMY    . MITRAL VALVE REPLACEMENT     1990's  . TONSILLECTOMY    . VIDEO BRONCHOSCOPY WITH ENDOBRONCHIAL NAVIGATION N/A 01/06/2014   Procedure: VIDEO BRONCHOSCOPY WITH ENDOBRONCHIAL NAVIGATION;  Surgeon: [name], MD;  Location: [facility];  Service: Thoracic;  Laterality: N/A;  . VIDEO BRONCHOSCOPY WITH ENDOBRONCHIAL NAVIGATION N/A 11/21/2014   Procedure: VIDEO BRONCHOSCOPY WITH ENDOBRONCHIAL NAVIGATION;  Surgeon: [name], MD;  Location: [facility];  Service: Thoracic;  Laterality: N/A;  . VIDEO BRONCHOSCOPY WITH ENDOBRONCHIAL ULTRASOUND N/A 01/06/2014   Procedure: VIDEO BRONCHOSCOPY WITH ENDOBRONCHIAL ULTRASOUND;  Surgeon: [name], MD;  Location: [facility];   Service: Thoracic;  Laterality: N/A;  . VIDEO BRONCHOSCOPY WITH ENDOBRONCHIAL ULTRASOUND N/A 11/21/2014   Procedure: VIDEO BRONCHOSCOPY WITH ENDOBRONCHIAL ULTRASOUND;  Surgeon: [name], MD;  Location: [facility];  Service: Thoracic;  Laterality: N/A;    REVIEW OF SYSTEMS:  Constitutional: positive for anorexia, fatigue and weight loss Eyes: negative for icterus, irritation and redness Ears, nose, mouth, throat, and face: negative for epistaxis, sore mouth and sore throat Respiratory: positive for cough, dyspnea on exertion and wheezing Cardiovascular: positive for fatigue and tachypnea Gastrointestinal: negative for constipation, diarrhea, nausea and vomiting Genitourinary:negative for dysuria, frequency and hematuria Integument/breast: negative for dryness and rash Hematologic/lymphatic: positive for bleeding and easy bruising Musculoskeletal:positive for arthralgias and muscle weakness Neurological: negative for seizures and  speech problems Behavioral/Psych: positive for depression Endocrine: negative Allergic/Immunologic: negative   PHYSICAL EXAMINATION: General appearance: alert, cooperative, fatigued and mild distress Head: Normocephalic, without obvious abnormality, atraumatic Neck: no adenopathy, no JVD, supple, symmetrical, trachea midline and thyroid not enlarged, symmetric, no tenderness/mass/nodules Lymph nodes: Cervical, supraclavicular, and axillary nodes normal. Resp: diminished breath sounds RLL, dullness to percussion RLL and wheezes bilaterally Back: symmetric, no curvature. ROM normal. No CVA tenderness. Cardio: Tachycardic GI: soft, non-tender; bowel sounds normal; no masses,  no organomegaly Extremities: extremities normal, atraumatic, no cyanosis or edema Neurologic: Alert and oriented X 3, normal strength and tone. Normal symmetric reflexes. Normal coordination and gait  ECOG PERFORMANCE STATUS: 2 - Symptomatic, <50% confined to bed  Blood pressure (!)  150/79, pulse (!) 119, temperature 98.3 F (36.8 C), temperature source Oral, resp. rate 18, height '5\' 3"'$  (1.6 m), weight 122 lb (55.3 kg), SpO2 94 %.  LABORATORY DATA: Lab Results  Component Value Date   WBC 11.0 (H) 02/22/2016   HGB 9.0 (L) 02/22/2016   HCT 31.6 (L) 02/22/2016   MCV 77.3 (L) 02/22/2016   PLT 234 02/22/2016      Chemistry      Component Value Date/Time   NA 140 02/22/2016 1354   K 3.7 02/22/2016 1354   CL 99 (L) 12/16/2015 0413   CL 98 04/17/2014 0409   CO2 34 (H) 02/22/2016 1354   BUN 13.6 02/22/2016 1354   CREATININE 0.8 02/22/2016 1354   GLU 177 (H) 02/08/2016 1511      Component Value Date/Time   CALCIUM 9.3 02/22/2016 1354   ALKPHOS 124 02/22/2016 1354   AST 20 02/22/2016 1354   ALT 12 02/22/2016 1354   BILITOT 0.62 02/22/2016 1354       RADIOGRAPHIC STUDIES: No results found.  ASSESSMENT AND PLAN: This is a very pleasant 86 ears old white female with a very complicated medical history including:  1) progressive non-small cell lung cancer initially diagnosed as a stage IIIA: She status post a course of concurrent chemoradiation and currently undergoing treatment with immunotherapy with Nivolumab status post 4 cycles. The patient is tolerating the treatment well but continues to have increasing fatigue and weakness as well as lack of appetite, dehydration and tachycardia. I recommended for the patient to have CT scan of the chest performed today for evaluation of her disease. She will proceed with cycle #5 today unless the scan showed any concerning abnormalities for disease progression. The CT scan was performed after the visit. The final report is still pending. I independently reviewed the scan images and it looks like the patient has evidence for disease progression with significant increase in the right pleural effusion. I would wait for the final report for any additional abnormalities. 2) lack of appetite and weight loss: I strongly encouraged  the patient to increase her by mouth intake. She was started on treatment with Remeron for depression and appetite but no significant improvement. I may consider her for treatment with Marinol if needed in the near future. I also recommend for the patient to see the dietitian at the [facility] but her daughter declined. 3) tachycardia and dehydration: I will arrange for the patient to receive 1 L of normal saline in the clinic today. 4) worsening dyspnea: This is likely secondary to enlarging right pleural effusion and probably disease progression. I will arrange for the patient to have ultrasound-guided right thoracentesis once the final scan report becomes available. 5) depression: She will continue on Remeron for  now. The patient would come back for follow-up visit in 2 weeks for evaluation or sooner if the scan showed clear evidence for progression. She was advised to call immediately if she has any concerning symptoms in the interval. The patient voices understanding of current disease status and treatment options and is in agreement with the current care plan.  All questions were answered. The patient knows to call the clinic with any problems, questions or concerns. We can certainly see the patient much sooner if necessary.  Disclaimer: This note was dictated with voice recognition software. Similar sounding words can inadvertently be transcribed and may not be corrected upon review.

## 2016-02-22 NOTE — Telephone Encounter (Signed)
Appointments scheduled per 12/4 LOS. Patient had to leave during scheduling.

## 2016-02-23 ENCOUNTER — Ambulatory Visit (INDEPENDENT_AMBULATORY_CARE_PROVIDER_SITE_OTHER): Payer: Medicare Other | Admitting: Cardiology

## 2016-02-23 DIAGNOSIS — C3411 Malignant neoplasm of upper lobe, right bronchus or lung: Secondary | ICD-10-CM | POA: Diagnosis not present

## 2016-02-23 DIAGNOSIS — I513 Intracardiac thrombosis, not elsewhere classified: Secondary | ICD-10-CM | POA: Diagnosis not present

## 2016-02-23 DIAGNOSIS — Z5181 Encounter for therapeutic drug level monitoring: Secondary | ICD-10-CM

## 2016-02-23 DIAGNOSIS — Z952 Presence of prosthetic heart valve: Secondary | ICD-10-CM

## 2016-02-23 DIAGNOSIS — I110 Hypertensive heart disease with heart failure: Secondary | ICD-10-CM | POA: Diagnosis not present

## 2016-02-23 DIAGNOSIS — E119 Type 2 diabetes mellitus without complications: Secondary | ICD-10-CM | POA: Diagnosis not present

## 2016-02-23 DIAGNOSIS — I5032 Chronic diastolic (congestive) heart failure: Secondary | ICD-10-CM | POA: Diagnosis not present

## 2016-02-23 DIAGNOSIS — J449 Chronic obstructive pulmonary disease, unspecified: Secondary | ICD-10-CM | POA: Diagnosis not present

## 2016-02-23 LAB — POCT INR: INR: 1.6

## 2016-02-29 ENCOUNTER — Ambulatory Visit (INDEPENDENT_AMBULATORY_CARE_PROVIDER_SITE_OTHER): Payer: Medicare Other | Admitting: Internal Medicine

## 2016-02-29 ENCOUNTER — Telehealth: Payer: Self-pay | Admitting: *Deleted

## 2016-02-29 DIAGNOSIS — C3411 Malignant neoplasm of upper lobe, right bronchus or lung: Secondary | ICD-10-CM | POA: Diagnosis not present

## 2016-02-29 DIAGNOSIS — I513 Intracardiac thrombosis, not elsewhere classified: Secondary | ICD-10-CM | POA: Diagnosis not present

## 2016-02-29 DIAGNOSIS — J449 Chronic obstructive pulmonary disease, unspecified: Secondary | ICD-10-CM | POA: Diagnosis not present

## 2016-02-29 DIAGNOSIS — I4891 Unspecified atrial fibrillation: Secondary | ICD-10-CM

## 2016-02-29 DIAGNOSIS — I5032 Chronic diastolic (congestive) heart failure: Secondary | ICD-10-CM | POA: Diagnosis not present

## 2016-02-29 DIAGNOSIS — Z952 Presence of prosthetic heart valve: Secondary | ICD-10-CM

## 2016-02-29 DIAGNOSIS — I110 Hypertensive heart disease with heart failure: Secondary | ICD-10-CM | POA: Diagnosis not present

## 2016-02-29 DIAGNOSIS — Z5181 Encounter for therapeutic drug level monitoring: Secondary | ICD-10-CM

## 2016-02-29 DIAGNOSIS — E119 Type 2 diabetes mellitus without complications: Secondary | ICD-10-CM | POA: Diagnosis not present

## 2016-02-29 LAB — POCT INR: INR: 1.5

## 2016-02-29 NOTE — Telephone Encounter (Signed)
Pt's daughter called stating the appt for 12/18 was supposed to be cancelled due to them coming in on 12/19.  I cancelled the 18th appt per her request.

## 2016-03-01 DIAGNOSIS — I513 Intracardiac thrombosis, not elsewhere classified: Secondary | ICD-10-CM | POA: Diagnosis not present

## 2016-03-01 DIAGNOSIS — I5032 Chronic diastolic (congestive) heart failure: Secondary | ICD-10-CM | POA: Diagnosis not present

## 2016-03-01 DIAGNOSIS — C3411 Malignant neoplasm of upper lobe, right bronchus or lung: Secondary | ICD-10-CM | POA: Diagnosis not present

## 2016-03-01 DIAGNOSIS — E119 Type 2 diabetes mellitus without complications: Secondary | ICD-10-CM | POA: Diagnosis not present

## 2016-03-01 DIAGNOSIS — J449 Chronic obstructive pulmonary disease, unspecified: Secondary | ICD-10-CM | POA: Diagnosis not present

## 2016-03-01 DIAGNOSIS — I110 Hypertensive heart disease with heart failure: Secondary | ICD-10-CM | POA: Diagnosis not present

## 2016-03-02 ENCOUNTER — Other Ambulatory Visit: Payer: Self-pay | Admitting: Medical Oncology

## 2016-03-02 DIAGNOSIS — J90 Pleural effusion, not elsewhere classified: Secondary | ICD-10-CM

## 2016-03-03 DIAGNOSIS — E119 Type 2 diabetes mellitus without complications: Secondary | ICD-10-CM | POA: Diagnosis not present

## 2016-03-03 DIAGNOSIS — I513 Intracardiac thrombosis, not elsewhere classified: Secondary | ICD-10-CM | POA: Diagnosis not present

## 2016-03-03 DIAGNOSIS — I5032 Chronic diastolic (congestive) heart failure: Secondary | ICD-10-CM | POA: Diagnosis not present

## 2016-03-03 DIAGNOSIS — J449 Chronic obstructive pulmonary disease, unspecified: Secondary | ICD-10-CM | POA: Diagnosis not present

## 2016-03-03 DIAGNOSIS — C3411 Malignant neoplasm of upper lobe, right bronchus or lung: Secondary | ICD-10-CM | POA: Diagnosis not present

## 2016-03-03 DIAGNOSIS — I110 Hypertensive heart disease with heart failure: Secondary | ICD-10-CM | POA: Diagnosis not present

## 2016-03-07 ENCOUNTER — Other Ambulatory Visit: Payer: Self-pay

## 2016-03-07 ENCOUNTER — Ambulatory Visit: Payer: Self-pay | Admitting: Internal Medicine

## 2016-03-07 ENCOUNTER — Ambulatory Visit (INDEPENDENT_AMBULATORY_CARE_PROVIDER_SITE_OTHER): Payer: Medicare Other | Admitting: Pharmacist

## 2016-03-07 DIAGNOSIS — C3411 Malignant neoplasm of upper lobe, right bronchus or lung: Secondary | ICD-10-CM | POA: Diagnosis not present

## 2016-03-07 DIAGNOSIS — E119 Type 2 diabetes mellitus without complications: Secondary | ICD-10-CM | POA: Diagnosis not present

## 2016-03-07 DIAGNOSIS — I513 Intracardiac thrombosis, not elsewhere classified: Secondary | ICD-10-CM | POA: Diagnosis not present

## 2016-03-07 DIAGNOSIS — Z5181 Encounter for therapeutic drug level monitoring: Secondary | ICD-10-CM

## 2016-03-07 DIAGNOSIS — I5032 Chronic diastolic (congestive) heart failure: Secondary | ICD-10-CM | POA: Diagnosis not present

## 2016-03-07 DIAGNOSIS — Z952 Presence of prosthetic heart valve: Secondary | ICD-10-CM

## 2016-03-07 DIAGNOSIS — I110 Hypertensive heart disease with heart failure: Secondary | ICD-10-CM | POA: Diagnosis not present

## 2016-03-07 DIAGNOSIS — J449 Chronic obstructive pulmonary disease, unspecified: Secondary | ICD-10-CM | POA: Diagnosis not present

## 2016-03-07 DIAGNOSIS — I4891 Unspecified atrial fibrillation: Secondary | ICD-10-CM

## 2016-03-07 LAB — POCT INR: INR: 1.6

## 2016-03-08 ENCOUNTER — Ambulatory Visit (HOSPITAL_COMMUNITY)
Admission: RE | Admit: 2016-03-08 | Discharge: 2016-03-08 | Disposition: A | Discharge status: Home / Self Care | Payer: Medicare Other | Source: Ambulatory Visit | Attending: Radiology | Admitting: Radiology

## 2016-03-08 ENCOUNTER — Telehealth: Payer: Self-pay | Admitting: Internal Medicine

## 2016-03-08 ENCOUNTER — Encounter: Payer: Self-pay | Admitting: Nurse Practitioner

## 2016-03-08 ENCOUNTER — Ambulatory Visit (HOSPITAL_BASED_OUTPATIENT_CLINIC_OR_DEPARTMENT_OTHER): Payer: Medicare Other | Admitting: Nurse Practitioner

## 2016-03-08 ENCOUNTER — Other Ambulatory Visit (HOSPITAL_BASED_OUTPATIENT_CLINIC_OR_DEPARTMENT_OTHER): Payer: Medicare Other

## 2016-03-08 ENCOUNTER — Ambulatory Visit (HOSPITAL_COMMUNITY)
Admission: RE | Admit: 2016-03-08 | Discharge: 2016-03-08 | Disposition: A | Discharge status: Home / Self Care | Payer: Medicare Other | Source: Ambulatory Visit | Attending: Internal Medicine | Admitting: Internal Medicine

## 2016-03-08 ENCOUNTER — Telehealth: Payer: Self-pay | Admitting: *Deleted

## 2016-03-08 ENCOUNTER — Other Ambulatory Visit: Payer: Self-pay | Admitting: Medical Oncology

## 2016-03-08 VITALS — BP 114/53 | HR 84 | Temp 98.4°F | Resp 18 | Ht 63.0 in | Wt 114.8 lb

## 2016-03-08 DIAGNOSIS — J948 Other specified pleural conditions: Secondary | ICD-10-CM | POA: Diagnosis not present

## 2016-03-08 DIAGNOSIS — I513 Intracardiac thrombosis, not elsewhere classified: Secondary | ICD-10-CM | POA: Diagnosis not present

## 2016-03-08 DIAGNOSIS — I110 Hypertensive heart disease with heart failure: Secondary | ICD-10-CM | POA: Diagnosis not present

## 2016-03-08 DIAGNOSIS — I5032 Chronic diastolic (congestive) heart failure: Secondary | ICD-10-CM | POA: Diagnosis not present

## 2016-03-08 DIAGNOSIS — J90 Pleural effusion, not elsewhere classified: Secondary | ICD-10-CM

## 2016-03-08 DIAGNOSIS — Z5112 Encounter for antineoplastic immunotherapy: Secondary | ICD-10-CM

## 2016-03-08 DIAGNOSIS — Z79899 Other long term (current) drug therapy: Secondary | ICD-10-CM

## 2016-03-08 DIAGNOSIS — J449 Chronic obstructive pulmonary disease, unspecified: Secondary | ICD-10-CM | POA: Diagnosis not present

## 2016-03-08 DIAGNOSIS — Z9889 Other specified postprocedural states: Secondary | ICD-10-CM

## 2016-03-08 DIAGNOSIS — C3431 Malignant neoplasm of lower lobe, right bronchus or lung: Secondary | ICD-10-CM

## 2016-03-08 DIAGNOSIS — E119 Type 2 diabetes mellitus without complications: Secondary | ICD-10-CM | POA: Diagnosis not present

## 2016-03-08 DIAGNOSIS — C3411 Malignant neoplasm of upper lobe, right bronchus or lung: Secondary | ICD-10-CM

## 2016-03-08 HISTORY — DX: Pleural effusion, not elsewhere classified: J90

## 2016-03-08 LAB — CBC WITH DIFFERENTIAL/PLATELET
BASO%: 0.1 % (ref 0.0–2.0)
BASOS ABS: 0 10*3/uL (ref 0.0–0.1)
EOS ABS: 0.2 10*3/uL (ref 0.0–0.5)
EOS%: 1.7 % (ref 0.0–7.0)
HEMATOCRIT: 31.9 % — AB (ref 34.8–46.6)
HEMOGLOBIN: 9.4 g/dL — AB (ref 11.6–15.9)
LYMPH#: 0.8 10*3/uL — AB (ref 0.9–3.3)
LYMPH%: 6.9 % — ABNORMAL LOW (ref 14.0–49.7)
MCH: 22.3 pg — AB (ref 25.1–34.0)
MCHC: 29.5 g/dL — ABNORMAL LOW (ref 31.5–36.0)
MCV: 75.6 fL — AB (ref 79.5–101.0)
MONO#: 1 10*3/uL — AB (ref 0.1–0.9)
MONO%: 9.3 % (ref 0.0–14.0)
NEUT#: 8.9 10*3/uL — ABNORMAL HIGH (ref 1.5–6.5)
NEUT%: 82 % — AB (ref 38.4–76.8)
NRBC: 0 % (ref 0–0)
PLATELETS: 273 10*3/uL (ref 145–400)
RBC: 4.22 10*6/uL (ref 3.70–5.45)
RDW: 17.6 % — AB (ref 11.2–14.5)
WBC: 10.9 10*3/uL — ABNORMAL HIGH (ref 3.9–10.3)

## 2016-03-08 LAB — COMPREHENSIVE METABOLIC PANEL
ALBUMIN: 3.1 g/dL — AB (ref 3.5–5.0)
ALK PHOS: 118 U/L (ref 40–150)
ALT: 13 U/L (ref 0–55)
AST: 18 U/L (ref 5–34)
Anion Gap: 11 mEq/L (ref 3–11)
BILIRUBIN TOTAL: 0.46 mg/dL (ref 0.20–1.20)
BUN: 14.9 mg/dL (ref 7.0–26.0)
CALCIUM: 9.9 mg/dL (ref 8.4–10.4)
CO2: 37 mEq/L — ABNORMAL HIGH (ref 22–29)
Chloride: 91 mEq/L — ABNORMAL LOW (ref 98–109)
Creatinine: 0.8 mg/dL (ref 0.6–1.1)
EGFR: 65 mL/min/{1.73_m2} — AB (ref >90)
GLUCOSE: 190 mg/dL — AB (ref 70–140)
POTASSIUM: 4.1 meq/L (ref 3.5–5.1)
SODIUM: 139 meq/L (ref 136–145)
TOTAL PROTEIN: 7.4 g/dL (ref 6.4–8.3)

## 2016-03-08 LAB — TSH: TSH: 1.741 m[IU]/L (ref 0.308–3.960)

## 2016-03-08 NOTE — Progress Notes (Addendum)
Marissa [name] OFFICE PROGRESS NOTE   DIAGNOSIS: Progressive non-small cell lung cancer initially diagnosed as a stage IIIA  (T2, N2, M0) squamous cell carcinoma presented with recurrence in Marissa right upper lobe as well as mediastinal lymphadenopathy diagnosed in September 2016.  PRIOR THERAPY: Concurrent chemoradiation with weekly carboplatin for AUC of 2 and paclitaxel 45 MG/M2, status post 6 cycles.  CURRENT THERAPY: Immunotherapy with Nivolumab to 40 mg IV every 2 weeks status post 5 cycles. First dose was given on 12/29/2015.  INTERVAL HISTORY:   Marissa [name] returns as scheduled. She completed cycle 5 nivolumab 02/22/2016. She had a right thoracentesis earlier today. 1 L of fluid was removed. She has noted some improvement in Marissa shortness of breath. No fever. No nausea or vomiting. No diarrhea. No rash. Appetite overall described as good. Poor energy level.  Objective:  Vital signs in last 24 hours:  Blood pressure (!) 114/53, pulse 84, temperature 98.4 F (36.9 C), temperature source Oral, resp. rate 18, height '5\' 3"'$  (1.6 m), weight 114 lb 12.8 oz (52.1 kg), SpO2 94 %.    HEENT: No thrush or ulcers. Lymphatics: No palpable cervical or supraclavicular lymph nodes. Resp: Breath sounds diminished right lower lung field. Scattered wheezes. Cardio: Regular rate and rhythm. GI: No hepatomegaly. Vascular: No leg edema. Calves soft and nontender.    Lab Results:  Lab Results  Component Value Date   WBC 10.9 (H) 03/08/2016   HGB 9.4 (L) 03/08/2016   HCT 31.9 (L) 03/08/2016   MCV 75.6 (L) 03/08/2016   PLT 273 03/08/2016   NEUTROABS 8.9 (H) 03/08/2016    Imaging:  Dg Chest 1 View  Result Date: 03/08/2016 CLINICAL DATA:  Right thoracentesis, lung cancer EXAM: CHEST 1 VIEW COMPARISON:  12/07/2015 FINDINGS: Small right pleural effusion. Persistent right upper lobe and perihilar soft tissue mass consistent with lung malignancy. Trace left pleural effusion. No  pneumothorax. Stable cardiac silhouette. Prior CABG. Posttraumatic deformity of Marissa surgical neck of Marissa right proximal humerus. IMPRESSION: Small right pleural effusion.  No right pneumothorax. Persistent right upper lobe and perihilar soft tissue mass consistent with lung malignancy. Electronically Signed   By: Kathreen Devoid   On: 03/08/2016 12:21   US Thoracentesis Asp Pleural Space W/img Guide  Result Date: 03/08/2016 INDICATION: [name] with history of CHF, COPD, and lung cancer presents with right pleural effusion. Request made for diagnostic and therapeutic thoracentesis. EXAM: ULTRASOUND GUIDED DIAGNOTIC AND THERAPEUTIC RIGHT THORACENTESIS MEDICATIONS: 10 mL 1% lidocaine COMPLICATIONS: None immediate. PROCEDURE: An ultrasound guided thoracentesis was thoroughly discussed with Marissa [name] and questions answered. Marissa benefits, risks, alternatives and complications were also discussed. Marissa [name] understands and wishes to proceed with Marissa procedure. Written consent was obtained. Ultrasound was performed to localize and mark an adequate pocket of fluid in Marissa right chest. Marissa area was then prepped and draped in Marissa normal sterile fashion. 1% Lidocaine was used for local anesthesia. Under ultrasound guidance a Safe-T-centesis catheter was introduced. Thoracentesis was performed. Marissa catheter was removed and a dressing applied. FINDINGS: A total of approximately 1.0 liters of amber fluid was removed. Samples were sent to Marissa laboratory as requested by Marissa clinical team. IMPRESSION: Successful ultrasound guided diagnotic and therapeutic right thoracentesis yielding 1.0 of pleural fluid. Read by:  Brynda Greathouse PA-C Electronically Signed   By: Sandi Mariscal M.D.   On: 03/08/2016 12:18    Medications: I have reviewed Marissa [name]'s current medications.  Assessment/Plan: 1. Recurrent non-small cell lung cancer presented as  a stage IIIA, squamous cell carcinomaSeptember 2016; status postcourse of concurrent  chemoradiation with weekly carboplatin and paclitaxel;CT scan of Marissa chest 12/07/2015 showed evidence for disease progression.Nivolumab initiated 12/29/2015. Cycle 5 completed 02/22/2016. Restaging chest CT 02/22/2016 showed a significantly increased large right pleural effusion; bland versus tumor thrombus in Marissa left atrium decreased; mild growth of anterior right upper lobe pulmonary nodule; new complete right middle lobe consolidation with some volume loss and occlusion of Marissa right lower lobe bronchus; increased subcarinal and right supraclavicular lymphadenopathy; stable right lower paratracheal lymphadenopathy. 2. Left atrium thrombus. She continues Coumadin as prescribed by her cardiologist 3. Dyspnea. Improved following Marissa thoracentesis earlier today. 4. Depression. She continues Remeron.   Disposition: Marissa [name] appears unchanged. She has completed 5 cycles of nivolumab. Marissa recent restaging chest CT appears to show evidence of progression. Dr. Julien Nordmann recommends discontinuation of nivolumab and observation with a repeat chest CT at a three-month interval. Marissa [name] and her daughter understand it would be difficult for her to tolerate chemotherapy in her current condition. They are in agreement with this plan. She will be referred for a chest CT 05/17/2015 and a follow-up visit on 05/24/2015. She will contact Marissa office in Marissa interim with any problems.  [name] seen with Dr. Julien Nordmann.    Ned Card ANP/GNP-BC   03/08/2016  2:35 PM  ADDENDUM: Hematology/Oncology Attending: I had a face to face encounter with Marissa [name] today. I recommended her care plan. This is a very pleasant years old white female with recurrent non-small cell lung cancer, squamous cell carcinoma status post concurrent chemoradiation with weekly carboplatin and paclitaxel. She has been observation [name] developed disease progression. She was started on treatment with immunotherapy with Nivolumab status  post 5 cycles. Unfortunately Marissa recent CT scan of Marissa chest performed on 02/19/2016 showed significant increase in Marissa right pleural effusion with new complete right middle lobe consolidation and volume loss as well as occlusion of Marissa right lower lobe bronchus with increase in Marissa subcarinal and right supraclavicular lymphadenopathy. It also showed decrease in Marissa tumor thrombus in Marissa left atrium. I had a lengthy discussion with Marissa [name] and her daughter today about her current disease status and treatment options. I gave Marissa [name] Marissa option of palliative care and hospice referral versus consideration of systemic chemotherapy versus continuous observation and repeat imaging studies for now. Marissa [name] has no interest in starting systemic chemotherapy at this point and her performance status is probably 2-3 at this point. Marissa [name] agreed to continue on observation with repeat CT scan of Marissa chest in 3 months for restaging of her disease. For Marissa enlarging right pleural effusion, I arranged for Marissa [name] to have ultrasound-guided right thoracentesis performed earlier today with drainage of 1 L of pleural fluid. She felt a little bit better after Marissa thoracentesis. She was advised to call immediately if she has any concerning symptoms in Marissa interval. Disclaimer: This note was dictated with voice recognition software. Similar sounding words can inadvertently be transcribed and may be missed upon review. Eilleen Kempf., MD 03/08/16

## 2016-03-08 NOTE — Procedures (Signed)
Successful US guided diagnostic and therapeutic right thoracentesis. Yielded 1.0 liters of amber fluid. Pt tolerated procedure well. No immediate complications.  Specimen was sent for labs. CXR ordered.  Docia Barrier PA-C 03/08/2016 12:14 PM

## 2016-03-08 NOTE — Telephone Encounter (Signed)
"  This is [facility] administrator for my mother's appointment times.  I also need to know what time she is to report to [place]."  Appointment times provided.  Radiology want patient's checked in 15 minutes prior, advised arrive by 10:45 for [place].  No further questions.

## 2016-03-08 NOTE — Telephone Encounter (Signed)
Gave patient avs report and appointments for February and March. Central radiology will call re scan.

## 2016-03-16 DIAGNOSIS — E119 Type 2 diabetes mellitus without complications: Secondary | ICD-10-CM | POA: Diagnosis not present

## 2016-03-16 DIAGNOSIS — C3411 Malignant neoplasm of upper lobe, right bronchus or lung: Secondary | ICD-10-CM | POA: Diagnosis not present

## 2016-03-16 DIAGNOSIS — I110 Hypertensive heart disease with heart failure: Secondary | ICD-10-CM | POA: Diagnosis not present

## 2016-03-16 DIAGNOSIS — J449 Chronic obstructive pulmonary disease, unspecified: Secondary | ICD-10-CM | POA: Diagnosis not present

## 2016-03-16 DIAGNOSIS — I5032 Chronic diastolic (congestive) heart failure: Secondary | ICD-10-CM | POA: Diagnosis not present

## 2016-03-16 DIAGNOSIS — I513 Intracardiac thrombosis, not elsewhere classified: Secondary | ICD-10-CM | POA: Diagnosis not present

## 2016-03-16 LAB — POCT INR: INR: 1.5

## 2016-03-17 ENCOUNTER — Ambulatory Visit (INDEPENDENT_AMBULATORY_CARE_PROVIDER_SITE_OTHER): Payer: Medicare Other | Admitting: Internal Medicine

## 2016-03-17 DIAGNOSIS — Z952 Presence of prosthetic heart valve: Secondary | ICD-10-CM

## 2016-03-17 DIAGNOSIS — Z5181 Encounter for therapeutic drug level monitoring: Secondary | ICD-10-CM

## 2016-03-17 DIAGNOSIS — I4891 Unspecified atrial fibrillation: Secondary | ICD-10-CM

## 2016-03-21 ENCOUNTER — Other Ambulatory Visit: Payer: Self-pay | Admitting: Cardiology

## 2016-03-22 ENCOUNTER — Other Ambulatory Visit: Payer: Self-pay

## 2016-03-22 ENCOUNTER — Ambulatory Visit: Payer: Self-pay | Admitting: Internal Medicine

## 2016-03-23 ENCOUNTER — Telehealth: Payer: Self-pay | Admitting: Cardiology

## 2016-03-23 NOTE — Telephone Encounter (Signed)
Continued  On Friday 03/25/16

## 2016-03-23 NOTE — Telephone Encounter (Signed)
Returned call to [facility], RN she states pt's daughter could only be available at [place] or 5:30pm today, and Vaughan Basta already had pt's scheduled at that time.  Pt's daughter can only be available at those times secondary to work, therefore appt had to be moved to first available 8:30am appt on Friday 03/25/16, due to pt's family's time availability.  Will await INR results on Friday 03/25/16.

## 2016-03-23 NOTE — Telephone Encounter (Signed)
New message    Pt was suppose to have COUM checked today but homehealth didn't have a RN at the time the daughter requested   Is it possible to have the COUM

## 2016-03-25 ENCOUNTER — Ambulatory Visit (INDEPENDENT_AMBULATORY_CARE_PROVIDER_SITE_OTHER): Payer: Medicare Other | Admitting: Cardiovascular Disease

## 2016-03-25 DIAGNOSIS — C3411 Malignant neoplasm of upper lobe, right bronchus or lung: Secondary | ICD-10-CM | POA: Diagnosis not present

## 2016-03-25 DIAGNOSIS — I4891 Unspecified atrial fibrillation: Secondary | ICD-10-CM

## 2016-03-25 DIAGNOSIS — I5032 Chronic diastolic (congestive) heart failure: Secondary | ICD-10-CM | POA: Diagnosis not present

## 2016-03-25 DIAGNOSIS — E119 Type 2 diabetes mellitus without complications: Secondary | ICD-10-CM | POA: Diagnosis not present

## 2016-03-25 DIAGNOSIS — J449 Chronic obstructive pulmonary disease, unspecified: Secondary | ICD-10-CM | POA: Diagnosis not present

## 2016-03-25 DIAGNOSIS — I110 Hypertensive heart disease with heart failure: Secondary | ICD-10-CM | POA: Diagnosis not present

## 2016-03-25 DIAGNOSIS — Z952 Presence of prosthetic heart valve: Secondary | ICD-10-CM

## 2016-03-25 DIAGNOSIS — I513 Intracardiac thrombosis, not elsewhere classified: Secondary | ICD-10-CM | POA: Diagnosis not present

## 2016-03-25 DIAGNOSIS — Z5181 Encounter for therapeutic drug level monitoring: Secondary | ICD-10-CM

## 2016-03-25 LAB — POCT INR: INR: 2.4

## 2016-03-28 ENCOUNTER — Encounter: Payer: Self-pay | Admitting: Emergency Medicine

## 2016-03-28 ENCOUNTER — Emergency Department
Admission: EM | Admit: 2016-03-28 | Discharge: 2016-03-28 | Disposition: A | Discharge status: Home / Self Care | Payer: Medicare Other | Attending: Emergency Medicine | Admitting: Emergency Medicine

## 2016-03-28 DIAGNOSIS — E119 Type 2 diabetes mellitus without complications: Secondary | ICD-10-CM | POA: Insufficient documentation

## 2016-03-28 DIAGNOSIS — Z79899 Other long term (current) drug therapy: Secondary | ICD-10-CM | POA: Insufficient documentation

## 2016-03-28 DIAGNOSIS — Z7901 Long term (current) use of anticoagulants: Secondary | ICD-10-CM | POA: Diagnosis not present

## 2016-03-28 DIAGNOSIS — R062 Wheezing: Secondary | ICD-10-CM | POA: Diagnosis not present

## 2016-03-28 DIAGNOSIS — Z951 Presence of aortocoronary bypass graft: Secondary | ICD-10-CM | POA: Insufficient documentation

## 2016-03-28 DIAGNOSIS — R040 Epistaxis: Secondary | ICD-10-CM | POA: Diagnosis not present

## 2016-03-28 DIAGNOSIS — E039 Hypothyroidism, unspecified: Secondary | ICD-10-CM | POA: Insufficient documentation

## 2016-03-28 DIAGNOSIS — Z85118 Personal history of other malignant neoplasm of bronchus and lung: Secondary | ICD-10-CM | POA: Insufficient documentation

## 2016-03-28 DIAGNOSIS — Z87891 Personal history of nicotine dependence: Secondary | ICD-10-CM | POA: Diagnosis not present

## 2016-03-28 DIAGNOSIS — Z7982 Long term (current) use of aspirin: Secondary | ICD-10-CM | POA: Diagnosis not present

## 2016-03-28 DIAGNOSIS — I25810 Atherosclerosis of coronary artery bypass graft(s) without angina pectoris: Secondary | ICD-10-CM | POA: Insufficient documentation

## 2016-03-28 DIAGNOSIS — I5032 Chronic diastolic (congestive) heart failure: Secondary | ICD-10-CM | POA: Diagnosis not present

## 2016-03-28 DIAGNOSIS — Z7984 Long term (current) use of oral hypoglycemic drugs: Secondary | ICD-10-CM | POA: Diagnosis not present

## 2016-03-28 DIAGNOSIS — J449 Chronic obstructive pulmonary disease, unspecified: Secondary | ICD-10-CM | POA: Insufficient documentation

## 2016-03-28 DIAGNOSIS — I110 Hypertensive heart disease with heart failure: Secondary | ICD-10-CM | POA: Insufficient documentation

## 2016-03-28 LAB — CBC
HCT: 29.9 % — ABNORMAL LOW (ref 35.0–47.0)
HEMOGLOBIN: 9.7 g/dL — AB (ref 12.0–16.0)
MCH: 22.6 pg — ABNORMAL LOW (ref 26.0–34.0)
MCHC: 32.2 g/dL (ref 32.0–36.0)
MCV: 70 fL — ABNORMAL LOW (ref 80.0–100.0)
PLATELETS: 321 10*3/uL (ref 150–440)
RBC: 4.28 MIL/uL (ref 3.80–5.20)
RDW: 18.8 % — ABNORMAL HIGH (ref 11.5–14.5)
WBC: 10.8 10*3/uL (ref 3.6–11.0)

## 2016-03-28 LAB — COMPREHENSIVE METABOLIC PANEL
ALK PHOS: 89 U/L (ref 38–126)
ALT: 22 U/L (ref 14–54)
ANION GAP: 8 (ref 5–15)
AST: 25 U/L (ref 15–41)
Albumin: 3.4 g/dL — ABNORMAL LOW (ref 3.5–5.0)
BUN: 21 mg/dL — ABNORMAL HIGH (ref 6–20)
CALCIUM: 11.3 mg/dL — AB (ref 8.9–10.3)
CO2: 37 mmol/L — ABNORMAL HIGH (ref 22–32)
CREATININE: 0.77 mg/dL (ref 0.44–1.00)
Chloride: 95 mmol/L — ABNORMAL LOW (ref 101–111)
GFR calc Af Amer: >60 mL/min (ref >60)
Glucose, Bld: 146 mg/dL — ABNORMAL HIGH (ref 65–99)
Potassium: 4.2 mmol/L (ref 3.5–5.1)
Sodium: 140 mmol/L (ref 135–145)
Total Bilirubin: 0.7 mg/dL (ref 0.3–1.2)
Total Protein: 7.4 g/dL (ref 6.5–8.1)

## 2016-03-28 LAB — PROTIME-INR
INR: 1.84
Prothrombin Time: 21.5 seconds — ABNORMAL HIGH (ref 11.4–15.2)

## 2016-03-28 LAB — APTT: APTT: 50 s — AB (ref 24–36)

## 2016-03-28 MED ORDER — OXYMETAZOLINE HCL 0.05 % NA SOLN
1.0000 | Freq: Once | NASAL | Status: AC
  Administered 2016-03-28: 1 via NASAL
  Filled 2016-03-28: qty 15

## 2016-03-28 MED ORDER — IPRATROPIUM-ALBUTEROL 0.5-2.5 (3) MG/3ML IN SOLN
RESPIRATORY_TRACT | Status: AC
  Administered 2016-03-28: 3 mL via RESPIRATORY_TRACT
  Filled 2016-03-28: qty 3

## 2016-03-28 MED ORDER — IPRATROPIUM-ALBUTEROL 0.5-2.5 (3) MG/3ML IN SOLN
3.0000 mL | Freq: Once | RESPIRATORY_TRACT | Status: AC
  Administered 2016-03-28: 3 mL via RESPIRATORY_TRACT

## 2016-03-28 NOTE — ED Notes (Signed)
ED Provider at bedside. 

## 2016-03-28 NOTE — ED Triage Notes (Addendum)
Pt presents with nosebleed that began last night. Pt reports she takes warfarin. Pt wears 3 L O2 at all times but did not wear her oxygen due to the nosebleed. Pt SpO2 in triage 68 % RA, pt placed on NRB O2 and SpO2 increased to 98%. Bleeding controlled with nose clamp.

## 2016-03-28 NOTE — Discharge Instructions (Signed)
Please seek medical attention for any high fevers, chest pain, shortness of breath, change in behavior, persistent vomiting, bloody stool or any other new or concerning symptoms.  

## 2016-03-28 NOTE — ED Notes (Signed)
Pt placed on chronic 3L [place] at this time, o2 sats 98%

## 2016-03-28 NOTE — ED Notes (Addendum)
Per family pt started nose bleed around 5am, pt on coumadin. Pt hx of COPD and on cont 3L [place]. Pt denies any pain. Pt A&Ox4. Nose clamp taken off at this time

## 2016-03-28 NOTE — ED Triage Notes (Signed)
Clamp applied to nose  

## 2016-03-28 NOTE — ED Provider Notes (Signed)
[facility] Emergency Department Provider Note  ____________________________________________   I have reviewed the triage vital signs and the nursing notes.   HISTORY  Chief Complaint Epistaxis   History limited by: Poor historian   HPI Marissa [name] is a 83 y.o. female who presents to the emergency department today because of concern for a nose bleed. The patient states that the nose bleed started earlier this morning. It sounds like it was coming and going throughout the morning. The patient denies any trauma to her nose. She is on warfarin for blood clot per family. The patient has had nosebleeds in the past.    Past Medical History:  Diagnosis Date  . A-fib ([place])   . Aortic stenosis    (s/p AVR w/ 21 mm porcine valve 03/2011  . Aortic valve disorders   . Arthritis   . CAD (coronary artery disease)    s/p CABG 03/2011  . CHF (congestive heart failure) ([place])   . COPD (chronic obstructive pulmonary disease) ([place])   . Dehydration 02/22/2016  . Diabetes mellitus without complication ([place])   . Dysrhythmia   . Encounter for antineoplastic immunotherapy 12/21/2015  . Full code status 12/22/2014  . Full code status 12/22/2014  . GERD (gastroesophageal reflux disease)    takes nexium prn  . History of blood transfusion    Rheumatic Fever  . HTN (hypertension)   . Hypercholesteremia   . Hypothyroidism   . Lung cancer ([place])   . Mastoiditis   . Mitral valve disease    s/p MVR 03/2011 w/ 27 mm porcine valve  . Myocardial infarction    "slight"  . Osteopenia   . Pneumonia   . Pre-diabetes   . Recurrent right pleural effusion 03/08/2016  . RLS (restless legs syndrome)   . Shortness of breath    with exertion  . Weight loss 02/22/2016    Patient Active Problem List   Diagnosis Date Noted  . Recurrent right pleural effusion 03/08/2016  . Dehydration 02/22/2016  . Weight loss 02/22/2016  . Encounter for antineoplastic immunotherapy 12/21/2015  .  Encounter for therapeutic drug monitoring 12/18/2015  . Right ankle pain 12/15/2015  . Chronic diastolic congestive heart failure ([place])   . Epistaxis   . Left atrial thrombus 12/08/2015  . Cancer of lower lobe of right lung ([place]) 08/03/2015  . Aortic insufficiency 04/06/2015  . Aortic valve replaced 04/06/2015  . Mitral valve replaced 04/06/2015  . Postoperative anemia due to acute blood loss 01/04/2015  . Type 2 diabetes mellitus ([place]) 12/26/2014  . Closed left hip fracture ([place]) 12/25/2014  . Full code status 12/22/2014  . Full code status 12/22/2014  . Primary cancer of right upper lobe of lung ([place]) 12/19/2013  . Hypertensive heart disease with CHF (congestive heart failure) ([place])   . Hypercholesteremia   . CAD (coronary artery disease)   . Aortic valve disorder   . RLS (restless legs syndrome)   . A-fib ([place])   . Osteopenia   . Mitral valve disease   . Hypothyroidism   . COPD (chronic obstructive pulmonary disease) ([place])   . CHF (congestive heart failure) ([place])   . Aortic stenosis   . Mastoiditis     Past Surgical History:  Procedure Laterality Date  . AORTIC VALVE REPLACEMENT    . COMPRESSION HIP SCREW Left 12/26/2014   Procedure: ORIF LEFT HIP INTERTROCHANTRIC FRACTURE;  Surgeon: Frederik Pear, MD;  Location: [facility];  Service: Orthopedics;  Laterality: Left;  .  CORONARY ARTERY BYPASS GRAFT  03/2011   1990's also  . LAPAROSCOPIC CHOLECYSTECTOMY    . MITRAL VALVE REPLACEMENT     1990's  . TONSILLECTOMY    . VIDEO BRONCHOSCOPY WITH ENDOBRONCHIAL NAVIGATION N/A 01/06/2014   Procedure: VIDEO BRONCHOSCOPY WITH ENDOBRONCHIAL NAVIGATION;  Surgeon: [name], MD;  Location: [facility];  Service: Thoracic;  Laterality: N/A;  . VIDEO BRONCHOSCOPY WITH ENDOBRONCHIAL NAVIGATION N/A 11/21/2014   Procedure: VIDEO BRONCHOSCOPY WITH ENDOBRONCHIAL NAVIGATION;  Surgeon: [name], MD;  Location: [facility];  Service: Thoracic;  Laterality: N/A;  . VIDEO BRONCHOSCOPY WITH  ENDOBRONCHIAL ULTRASOUND N/A 01/06/2014   Procedure: VIDEO BRONCHOSCOPY WITH ENDOBRONCHIAL ULTRASOUND;  Surgeon: [name], MD;  Location: [facility];  Service: Thoracic;  Laterality: N/A;  . VIDEO BRONCHOSCOPY WITH ENDOBRONCHIAL ULTRASOUND N/A 11/21/2014   Procedure: VIDEO BRONCHOSCOPY WITH ENDOBRONCHIAL ULTRASOUND;  Surgeon: [name], MD;  Location: [facility];  Service: Thoracic;  Laterality: N/A;    Prior to Admission medications   Medication Sig Start Date End Date Taking? Authorizing Provider  acetaminophen (TYLENOL) 325 MG tablet Take 2 tablets (650 mg total) by mouth every 6 (six) hours as needed for mild pain (or Fever >/= 101). 12/29/14   Delfina Redwood, MD  albuterol (PROVENTIL) (2.5 MG/3ML) 0.083% nebulizer solution Inhale 3 mLs into the lungs 3 (three) times daily. 12/25/15   Historical Provider, MD  alendronate (FOSAMAX) 70 MG tablet Take 70 mg by mouth once a week.  09/16/14   Historical Provider, MD  aspirin EC 81 MG tablet Take 81 mg by mouth every morning.    Historical Provider, MD  atorvastatin (LIPITOR) 40 MG tablet Take 40 mg by mouth daily. For HLD    Historical Provider, MD  CALCIUM CARBONATE PO Take 1 tablet by mouth 2 (two) times daily.    Historical Provider, MD  esomeprazole (NEXIUM) 40 MG capsule Take 40 mg by mouth daily as needed (for acid reflux).     Historical Provider, MD  fluticasone-salmeterol (ADVAIR HFA) 115-21 MCG/ACT inhaler Inhale 2 puffs into the lungs 2 (two) times daily.     Historical Provider, MD  furosemide (LASIX) 20 MG tablet Take 1 tablet (20 mg total) by mouth 2 (two) times daily. 12/16/15   Mauricio Gerome Apley, MD  glimepiride (AMARYL) 1 MG tablet Take 1 mg by mouth daily with breakfast.    Historical Provider, MD  Ipratropium-Albuterol (COMBIVENT) 20-100 MCG/ACT AERS respimat Inhale 1 puff into the lungs every 6 (six) hours as needed for wheezing or shortness of breath.     Historical Provider, MD  ipratropium-albuterol (DUONEB)  0.5-2.5 (3) MG/3ML SOLN Take 3 mLs by nebulization every 6 (six) hours as needed (for shortness of breath).     Historical Provider, MD  levothyroxine (SYNTHROID, LEVOTHROID) 100 MCG tablet Take 100 mcg by mouth daily before breakfast.     Historical Provider, MD  mirtazapine (REMERON) 30 MG tablet Take 1 tablet (30 mg total) by mouth at bedtime. 02/08/16   Maryanna Shape, NP  Multiple Vitamin (MULTIVITAMIN WITH MINERALS) TABS tablet Take 1 tablet by mouth daily.    Historical Provider, MD  potassium chloride SA (K-DUR,KLOR-CON) 20 MEQ tablet Take 1 tablet (20 mEq total) by mouth daily. 08/25/15   Curt Bears, MD  rOPINIRole (REQUIP) 4 MG tablet Take 4 mg by mouth at bedtime.    Historical Provider, MD  TOPROL XL 50 MG 24 hr tablet TAKE 1 TABLET EVERY EVENING (DISCONTINUE CARVEDIOLOL PRESCRIPTION) 11/26/15  Jerline Pain, MD  vitamin C (ASCORBIC ACID) 500 MG tablet Take 500 mg by mouth daily.    Historical Provider, MD  warfarin (COUMADIN) 1 MG tablet Take as directed by [facility] 03/22/16   Jerline Pain, MD    Allergies Prozac [fluoxetine hcl]  Family History  Problem Relation Age of Onset  . Adopted: Yes  . Cancer Brother     lung  . Heart attack Neg Hx   . Stroke Neg Hx     Social History Social History  Substance Use Topics  . Smoking status: Former Smoker    Packs/day: 0.50    Years: 30.00    Types: Cigarettes  . Smokeless tobacco: Never Used  . Alcohol use No    Review of Systems  Constitutional: Negative for fever. Cardiovascular: Negative for chest pain. Respiratory: Negative for shortness of breath. Gastrointestinal: Negative for abdominal pain, vomiting and diarrhea. Neurological: Negative for headaches, focal weakness or numbness.  10-point ROS otherwise negative.  ____________________________________________   PHYSICAL EXAM:  VITAL SIGNS: ED Triage Vitals  Enc Vitals Group     BP 03/28/16 0914 (!) 134/57     Pulse Rate 03/28/16 0914 98      Resp --      Temp --      Temp src --      SpO2 03/28/16 0914 99 %     Weight 03/28/16 0915 114 lb (51.7 kg)     Height 03/28/16 0915 '5\' 3"'$  (1.6 m)     Head Circumference --      Peak Flow --      Pain Score 03/28/16 0915 0   Constitutional: Alert and oriented. Well appearing and in no distress. Eyes: Conjunctivae are normal. Normal extraocular movements. ENT   Head: Normocephalic and atraumatic.   Nose: Blood in bilateral nares. Some clots noted bilaterally. No obvious source of the bleeding.    Mouth/Throat: Mucous membranes are moist.   Neck: No stridor. Hematological/Lymphatic/Immunilogical: No cervical lymphadenopathy. Cardiovascular: Normal rate, regular rhythm.  No murmurs, rubs, or gallops.  Respiratory: Slightly increased respiratory rate. Diffuse bilateral wheezing. Gastrointestinal: Soft and non tender. No rebound. No guarding.  Genitourinary: Deferred Musculoskeletal: Normal range of motion in all extremities. No lower extremity edema. Neurologic:  Normal speech and language. No gross focal neurologic deficits are appreciated.  Skin:  Skin is warm, dry and intact. No rash noted. Psychiatric: Mood and affect are normal. Speech and behavior are normal. Patient exhibits appropriate insight and judgment.  ____________________________________________    LABS (pertinent positives/negatives)  Labs Reviewed  APTT - Abnormal; Notable for the following:       Result Value   aPTT 50 (*)    All other components within normal limits  PROTIME-INR - Abnormal; Notable for the following:    Prothrombin Time 21.5 (*)    All other components within normal limits  COMPREHENSIVE METABOLIC PANEL - Abnormal; Notable for the following:    Chloride 95 (*)    CO2 37 (*)    Glucose, Bld 146 (*)    BUN 21 (*)    Calcium 11.3 (*)    Albumin 3.4 (*)    All other components within normal limits  CBC - Abnormal; Notable for the following:    Hemoglobin 9.7 (*)    HCT 29.9  (*)    MCV 70.0 (*)    MCH 22.6 (*)    RDW 18.8 (*)    All other components within normal limits  ____________________________________________   EKG  None  ____________________________________________    RADIOLOGY  None  ____________________________________________   PROCEDURES  Procedures  ____________________________________________   INITIAL IMPRESSION / ASSESSMENT AND PLAN / ED COURSE  Pertinent labs & imaging results that were available during my care of the patient were reviewed by me and considered in my medical decision making (see chart for details).  Patient to the ED because of concern for nosebleed. Had her nose clamped in triage. No active bleeding noted on exam. Given history of warfarin did have nurse place afrin. Patient was able to be placed back on nasal cannula (wears oxygen at home) with good oxygenation. Will plan on discharging home. Will give ENT follow.  ____________________________________________   FINAL CLINICAL IMPRESSION(S) / ED DIAGNOSES  Final diagnoses:  Epistaxis     Note: This dictation was prepared with Dragon dictation. Any transcriptional errors that result from this process are unintentional     Nance Pear, MD 03/28/16 1356

## 2016-04-04 ENCOUNTER — Other Ambulatory Visit: Payer: Self-pay

## 2016-04-04 ENCOUNTER — Ambulatory Visit (INDEPENDENT_AMBULATORY_CARE_PROVIDER_SITE_OTHER): Payer: Medicare Other

## 2016-04-04 ENCOUNTER — Ambulatory Visit: Payer: Self-pay | Admitting: Internal Medicine

## 2016-04-04 DIAGNOSIS — C3411 Malignant neoplasm of upper lobe, right bronchus or lung: Secondary | ICD-10-CM | POA: Diagnosis not present

## 2016-04-04 DIAGNOSIS — I5032 Chronic diastolic (congestive) heart failure: Secondary | ICD-10-CM | POA: Diagnosis not present

## 2016-04-04 DIAGNOSIS — I513 Intracardiac thrombosis, not elsewhere classified: Secondary | ICD-10-CM | POA: Diagnosis not present

## 2016-04-04 DIAGNOSIS — I110 Hypertensive heart disease with heart failure: Secondary | ICD-10-CM | POA: Diagnosis not present

## 2016-04-04 DIAGNOSIS — Z5181 Encounter for therapeutic drug level monitoring: Secondary | ICD-10-CM

## 2016-04-04 DIAGNOSIS — Z952 Presence of prosthetic heart valve: Secondary | ICD-10-CM

## 2016-04-04 DIAGNOSIS — E119 Type 2 diabetes mellitus without complications: Secondary | ICD-10-CM | POA: Diagnosis not present

## 2016-04-04 DIAGNOSIS — I4891 Unspecified atrial fibrillation: Secondary | ICD-10-CM

## 2016-04-04 DIAGNOSIS — J449 Chronic obstructive pulmonary disease, unspecified: Secondary | ICD-10-CM | POA: Diagnosis not present

## 2016-04-04 LAB — POCT INR: INR: 2.4

## 2016-04-11 ENCOUNTER — Ambulatory Visit (INDEPENDENT_AMBULATORY_CARE_PROVIDER_SITE_OTHER): Payer: Medicare Other | Admitting: Cardiovascular Disease

## 2016-04-11 DIAGNOSIS — C3411 Malignant neoplasm of upper lobe, right bronchus or lung: Secondary | ICD-10-CM | POA: Diagnosis not present

## 2016-04-11 DIAGNOSIS — Z952 Presence of prosthetic heart valve: Secondary | ICD-10-CM

## 2016-04-11 DIAGNOSIS — E119 Type 2 diabetes mellitus without complications: Secondary | ICD-10-CM | POA: Diagnosis not present

## 2016-04-11 DIAGNOSIS — I513 Intracardiac thrombosis, not elsewhere classified: Secondary | ICD-10-CM | POA: Diagnosis not present

## 2016-04-11 DIAGNOSIS — I4891 Unspecified atrial fibrillation: Secondary | ICD-10-CM

## 2016-04-11 DIAGNOSIS — I5032 Chronic diastolic (congestive) heart failure: Secondary | ICD-10-CM | POA: Diagnosis not present

## 2016-04-11 DIAGNOSIS — Z5181 Encounter for therapeutic drug level monitoring: Secondary | ICD-10-CM

## 2016-04-11 DIAGNOSIS — I110 Hypertensive heart disease with heart failure: Secondary | ICD-10-CM | POA: Diagnosis not present

## 2016-04-11 DIAGNOSIS — J449 Chronic obstructive pulmonary disease, unspecified: Secondary | ICD-10-CM | POA: Diagnosis not present

## 2016-04-11 LAB — POCT INR: INR: 1.6

## 2016-04-18 ENCOUNTER — Other Ambulatory Visit: Payer: Self-pay

## 2016-04-18 ENCOUNTER — Ambulatory Visit: Payer: Self-pay | Admitting: Internal Medicine

## 2016-04-28 ENCOUNTER — Encounter: Payer: Self-pay | Admitting: Cardiology

## 2016-05-02 ENCOUNTER — Ambulatory Visit (INDEPENDENT_AMBULATORY_CARE_PROVIDER_SITE_OTHER): Payer: Medicare Other | Admitting: *Deleted

## 2016-05-02 ENCOUNTER — Ambulatory Visit (INDEPENDENT_AMBULATORY_CARE_PROVIDER_SITE_OTHER): Payer: Medicare Other | Admitting: Cardiology

## 2016-05-02 ENCOUNTER — Encounter: Payer: Self-pay | Admitting: Cardiology

## 2016-05-02 VITALS — BP 140/68 | HR 97 | Ht 63.0 in | Wt 110.1 lb

## 2016-05-02 DIAGNOSIS — I482 Chronic atrial fibrillation, unspecified: Secondary | ICD-10-CM

## 2016-05-02 DIAGNOSIS — I351 Nonrheumatic aortic (valve) insufficiency: Secondary | ICD-10-CM | POA: Diagnosis not present

## 2016-05-02 DIAGNOSIS — Z5181 Encounter for therapeutic drug level monitoring: Secondary | ICD-10-CM

## 2016-05-02 DIAGNOSIS — I481 Persistent atrial fibrillation: Secondary | ICD-10-CM

## 2016-05-02 DIAGNOSIS — I4891 Unspecified atrial fibrillation: Secondary | ICD-10-CM

## 2016-05-02 DIAGNOSIS — I4819 Other persistent atrial fibrillation: Secondary | ICD-10-CM

## 2016-05-02 DIAGNOSIS — Z952 Presence of prosthetic heart valve: Secondary | ICD-10-CM | POA: Diagnosis not present

## 2016-05-02 LAB — POCT INR: INR: 3.2

## 2016-05-02 NOTE — Patient Instructions (Signed)
Medication Instructions:  The current medical regimen is effective;  continue present plan and medications.  Follow-Up: Follow up in 6 months with Cecilie Kicks.  You will receive a letter in the mail 2 months before you are due.  Please call us when you receive this letter to schedule your follow up appointment.  Follow up in 1 year with Dr. Marlou Porch.  You will receive a letter in the mail 2 months before you are due.  Please call us when you receive this letter to schedule your follow up appointment.  If you need a refill on your cardiac medications before your next appointment, please call your pharmacy.  Thank you for choosing [facility]!!

## 2016-05-02 NOTE — Progress Notes (Signed)
[facility]. [address], [place]  [address] Phone: [phone] Fax:  [phone]  Date:  05/02/2016   ID:  GERALD HONEA, DOB [birth year], MRN [id number]  PCP:  [name], MD   History of Present Illness: Marissa [name] is a 83 y.o. female with coronary artery disease status post bypass surgery with redo bypass surgery in January of 2013, COPD, aortic valve replacement in 2013 with 21 mm porcine valve, history of rheumatic fever with mitral valve replacement in 2013 with 27 mm porcine valve, history of atrial fibrillation, pulmonary hypertension, heart failure, stage IIIA Non-small cell lung cancer, squamous cell carcinoma presented with recurrence in the right upper lobe and additional mediastinal lymphadenopathy diagnosed in September 2016 s/p chemoradiation with carboplatin and paclitaxel here for follow-up. Has left atrial thrombus on CT scan on anticoagulation with Coumadin.  Coronary bypass surgery was performed in [facility]. She had moderate aortic regurgitation, moderate to severe pulmonary hypertension, right coronary artery was occluded, patent right coronary bypass graft with some irregularity, LIMA to LAD patent in 2013.   She had a chronic long-standing atrial fibrillation history but has been intolerant to anticoagulants. Coumadin very small doses have caused bleeding into her ear, she states. She's been reluctant to try anticoagulantion again because of this. She has never had a stroke. Her redo bypass involved a saphenous vein graft to the RCA and redo mitral valve replacement.  She has had a previous admission to [facility] for "diastolic heart failure ".Normal ejection fraction, normal functioning mitral valve and aortic valve prosthesis.     Wt Readings from Last 3 Encounters:  05/02/16 110 lb 1.9 oz (50 kg)  03/28/16 114 lb (51.7 kg)  03/08/16 114 lb 12.8 oz (52.1 kg)     Past Medical History:  Diagnosis Date  . A-fib  ([place])   . Aortic stenosis    (s/p AVR w/ 21 mm porcine valve 03/2011  . Aortic valve disorders   . Arthritis   . CAD (coronary artery disease)    s/p CABG 03/2011  . CHF (congestive heart failure) ([place])   . COPD (chronic obstructive pulmonary disease) ([place])   . Dehydration 02/22/2016  . Diabetes mellitus without complication ([place])   . Dysrhythmia   . Encounter for antineoplastic immunotherapy 12/21/2015  . Full code status 12/22/2014  . Full code status 12/22/2014  . GERD (gastroesophageal reflux disease)    takes nexium prn  . History of blood transfusion    Rheumatic Fever  . HTN (hypertension)   . Hypercholesteremia   . Hypothyroidism   . Lung cancer ([place])   . Mastoiditis   . Mitral valve disease    s/p MVR 03/2011 w/ 27 mm porcine valve  . Myocardial infarction    "slight"  . Osteopenia   . Pneumonia   . Pre-diabetes   . Recurrent right pleural effusion 03/08/2016  . RLS (restless legs syndrome)   . Shortness of breath    with exertion  . Weight loss 02/22/2016    Past Surgical History:  Procedure Laterality Date  . AORTIC VALVE REPLACEMENT    . COMPRESSION HIP SCREW Left 12/26/2014   Procedure: ORIF LEFT HIP INTERTROCHANTRIC FRACTURE;  Surgeon: Frederik Pear, MD;  Location: [place];  Service: Orthopedics;  Laterality: Left;  . CORONARY ARTERY BYPASS GRAFT  03/2011   1990's also  . LAPAROSCOPIC CHOLECYSTECTOMY    . MITRAL VALVE REPLACEMENT     1990's  . TONSILLECTOMY    .  VIDEO BRONCHOSCOPY WITH ENDOBRONCHIAL NAVIGATION N/A 01/06/2014   Procedure: VIDEO BRONCHOSCOPY WITH ENDOBRONCHIAL NAVIGATION;  Surgeon: [name], MD;  Location: [place];  Service: Thoracic;  Laterality: N/A;  . VIDEO BRONCHOSCOPY WITH ENDOBRONCHIAL NAVIGATION N/A 11/21/2014   Procedure: VIDEO BRONCHOSCOPY WITH ENDOBRONCHIAL NAVIGATION;  Surgeon: [name], MD;  Location: [place];  Service: Thoracic;  Laterality: N/A;  . VIDEO BRONCHOSCOPY WITH ENDOBRONCHIAL ULTRASOUND N/A 01/06/2014    Procedure: VIDEO BRONCHOSCOPY WITH ENDOBRONCHIAL ULTRASOUND;  Surgeon: [name], MD;  Location: [place];  Service: Thoracic;  Laterality: N/A;  . VIDEO BRONCHOSCOPY WITH ENDOBRONCHIAL ULTRASOUND N/A 11/21/2014   Procedure: VIDEO BRONCHOSCOPY WITH ENDOBRONCHIAL ULTRASOUND;  Surgeon: [name], MD;  Location: [place];  Service: Thoracic;  Laterality: N/A;    Current Outpatient Prescriptions  Medication Sig Dispense Refill  . acetaminophen (TYLENOL) 325 MG tablet Take 2 tablets (650 mg total) by mouth every 6 (six) hours as needed for mild pain (or Fever >/= 101).    [name] albuterol (PROVENTIL) (2.5 MG/3ML) 0.083% nebulizer solution Inhale 3 mLs into the lungs 3 (three) times daily.    [name] alendronate (FOSAMAX) 70 MG tablet Take 70 mg by mouth once a week.     [name] aspirin EC 81 MG tablet Take 81 mg by mouth every morning.    [name] atorvastatin (LIPITOR) 40 MG tablet Take 40 mg by mouth daily. For HLD    . CALCIUM CARBONATE PO Take 1 tablet by mouth 2 (two) times daily.    [name] esomeprazole (NEXIUM) 40 MG capsule Take 40 mg by mouth daily as needed (for acid reflux).     . fluticasone-salmeterol (ADVAIR HFA) 115-21 MCG/ACT inhaler Inhale 2 puffs into the lungs 2 (two) times daily.     . furosemide (LASIX) 20 MG tablet Take 1 tablet (20 mg total) by mouth 2 (two) times daily. 30 tablet 0  . glimepiride (AMARYL) 1 MG tablet Take 1 mg by mouth daily with breakfast.    . Ipratropium-Albuterol (COMBIVENT) 20-100 MCG/ACT AERS respimat Inhale 1 puff into the lungs every 6 (six) hours as needed for wheezing or shortness of breath.     [name] ipratropium-albuterol (DUONEB) 0.5-2.5 (3) MG/3ML SOLN Take 3 mLs by nebulization every 6 (six) hours as needed (for shortness of breath).     [name] levothyroxine (SYNTHROID, LEVOTHROID) 100 MCG tablet Take 100 mcg by mouth daily before breakfast.     . mirtazapine (REMERON) 30 MG tablet Take 1 tablet (30 mg total) by mouth at bedtime. 30 tablet 0  . Multiple Vitamin  (MULTIVITAMIN WITH MINERALS) TABS tablet Take 1 tablet by mouth daily.    . potassium chloride SA (K-DUR,KLOR-CON) 20 MEQ tablet Take 1 tablet (20 mEq total) by mouth daily. 7 tablet 0  . rOPINIRole (REQUIP) 4 MG tablet Take 4 mg by mouth at bedtime.    . TOPROL XL 50 MG 24 hr tablet TAKE 1 TABLET EVERY EVENING (DISCONTINUE CARVEDIOLOL PRESCRIPTION) 90 tablet 3  . vitamin C (ASCORBIC ACID) 500 MG tablet Take 500 mg by mouth daily.    [name] warfarin (COUMADIN) 1 MG tablet Take as directed by [facility] 200 tablet 1   No current facility-administered medications for this visit.     Allergies:    Allergies  Allergen Reactions  . Prozac [Fluoxetine Hcl] Rash    Social History:  The patient  reports that she has quit smoking. Her smoking use included Cigarettes. She has a 15.00 pack-year smoking history. She has never  used smokeless tobacco. She reports that she does not drink alcohol or use drugs. her son died in 10-29-11.  ROS:  Please see the history of present illness.   Denies any bleeding, syncope, orthopnea, PND  PHYSICAL EXAM: VS:  BP 140/68   Pulse 97   Ht '5\' 3"'$  (1.6 m)   Wt 110 lb 1.9 oz (50 kg)   SpO2 98%   BMI 19.51 kg/m  Well nourished, well developed, in no acute distress trouble breathing with ambulation HEENT: normal  Neck: no JVD  Cardiac:  Irregularly irregular; loud singing diastolic murmur Prior surgical scar noted Lungs:  Distant breath sounds bilaterally, no active wheezing  Abd: soft, nontender, no hepatomegaly  Ext: no edema Mild erythema on medial aspect of ankle right leg.  Skin: warm and dry  Neuro: no focal abnormalities noted  EKG: 08/11/14-atrial fibrillation heart rate 70, rightward axis, nonspecific ST-T wave changes, LVH with repolarization abnormality-previously 02/25/13- Atrial fibrillation, heart rate 80, nonspecific ST changes   09/09/13-atrial fibrillation, 77, borderline right bundle branch block.    ECHO 12/09/15: - Procedure narrative:  Transthoracic echocardiography. Image   quality was adequate. The study was technically difficult.   Intravenous contrast (Definity) was administered. - Left ventricle: The cavity size was normal. Wall thickness was   increased in a pattern of mild LVH. Systolic function was normal.   The estimated ejection fraction was in the range of 60% to 65%.   Wall motion was normal; there were no regional wall motion   abnormalities. - Aortic valve: A bioprosthesis was present. There was mild   regurgitation. - Mitral valve: A bioprosthesis was present. Valve area by pressure   half-time: 2.37 cm^2. - Left atrium: The atrium was moderately to severely dilated. - Pulmonary arteries: Systolic pressure was moderately to severely   increased. PA peak pressure: 67 mm Hg (S).  CT scan 02/22/16 1. Large right pleural effusion, significantly increased. 2. Criss Rosales versus tumor thrombus in the left atrium is decreased. 3. Mild growth of anterior right upper lobe pulmonary nodule, most consistent with enlarging recurrent tumor. Stable radiation fibrosis in the right upper lobe. 4. New complete right middle lobe consolidation with some volume loss and occlusion of the right middle lobe bronchus. Findings likely represent a combination of right middle lobe atelectasis and postobstructive pneumonia, with a component of tumor not excluded. Moderate compressive atelectasis in the right lower lobe. 5. Increased subcarinal and right supraclavicular lymphadenopathy, suspicious for enlarging nodal metastases. Stable right lower paratracheal lymphadenopathy. 6. Stable incompletely healed lateral right third rib fracture, possibly due to radiation osteonecrosis. 7. Additional findings include aortic atherosclerosis, 3 vessel coronary atherosclerosis status post CABG and stable mild cardiomegaly and dilated main pulmonary artery suggesting chronic pulmonary arterial hypertension.  ASSESSMENT AND PLAN:  1. Lung  cancer-recurrent right upper lobe. Dr. Earlie Server. 2. Coronary artery disease-status post bypass. No active anginal symptoms. Prior bypass anatomy reviewed. 3. Chronic diastolic heart failure- stable 4. Atrial fibrillation- previous lengthy discussions about anticoagulation. Coumadin unfortunately is her only viable option given her aortic and mitral valve replacements and left atrial thrombus formation. She has had epistaxis in the past from picking her nose. 5. Left atrial thrombus on CT scan October 2017. 6. Aortic valve replacement-previous aortic stenosis. Echocardiogram reviewed. 12/26/14 which shows new aortic insufficiency, musical singing murmur noted on exam.. 7. Mitral valve replacement as well. Dental prophylaxis 8. COPD-she states that she quit smoking in the 80s. She wishes that there were a therapy to  fix the emphysema.home oxygen 9. Peripheral neuropathy- Dr. Marisue Humble. She has excellent dorsalis pedis pulse.  10. 6 month f/u with Cecille Rubin, 12 with me.  Signed, Candee Furbish, MD [facility]  05/02/2016 1:46 PM

## 2016-05-09 ENCOUNTER — Ambulatory Visit: Payer: Self-pay | Admitting: Nurse Practitioner

## 2016-05-16 ENCOUNTER — Ambulatory Visit (HOSPITAL_COMMUNITY)
Admission: RE | Admit: 2016-05-16 | Discharge: 2016-05-16 | Disposition: A | Discharge status: Home / Self Care | Payer: Medicare Other | Source: Ambulatory Visit | Attending: Nurse Practitioner | Admitting: Nurse Practitioner

## 2016-05-16 ENCOUNTER — Ambulatory Visit (INDEPENDENT_AMBULATORY_CARE_PROVIDER_SITE_OTHER): Payer: Medicare Other | Admitting: *Deleted

## 2016-05-16 ENCOUNTER — Encounter (HOSPITAL_COMMUNITY): Payer: Self-pay

## 2016-05-16 ENCOUNTER — Other Ambulatory Visit (HOSPITAL_BASED_OUTPATIENT_CLINIC_OR_DEPARTMENT_OTHER): Payer: Medicare Other

## 2016-05-16 DIAGNOSIS — R918 Other nonspecific abnormal finding of lung field: Secondary | ICD-10-CM | POA: Insufficient documentation

## 2016-05-16 DIAGNOSIS — C3411 Malignant neoplasm of upper lobe, right bronchus or lung: Secondary | ICD-10-CM | POA: Diagnosis not present

## 2016-05-16 DIAGNOSIS — I4891 Unspecified atrial fibrillation: Secondary | ICD-10-CM

## 2016-05-16 DIAGNOSIS — I482 Chronic atrial fibrillation, unspecified: Secondary | ICD-10-CM

## 2016-05-16 DIAGNOSIS — J90 Pleural effusion, not elsewhere classified: Secondary | ICD-10-CM | POA: Diagnosis not present

## 2016-05-16 DIAGNOSIS — Z5181 Encounter for therapeutic drug level monitoring: Secondary | ICD-10-CM | POA: Diagnosis not present

## 2016-05-16 DIAGNOSIS — C3491 Malignant neoplasm of unspecified part of right bronchus or lung: Secondary | ICD-10-CM | POA: Diagnosis not present

## 2016-05-16 DIAGNOSIS — C771 Secondary and unspecified malignant neoplasm of intrathoracic lymph nodes: Secondary | ICD-10-CM | POA: Insufficient documentation

## 2016-05-16 DIAGNOSIS — Z923 Personal history of irradiation: Secondary | ICD-10-CM | POA: Insufficient documentation

## 2016-05-16 DIAGNOSIS — C3431 Malignant neoplasm of lower lobe, right bronchus or lung: Secondary | ICD-10-CM

## 2016-05-16 DIAGNOSIS — Z952 Presence of prosthetic heart valve: Secondary | ICD-10-CM | POA: Diagnosis not present

## 2016-05-16 DIAGNOSIS — Z79899 Other long term (current) drug therapy: Secondary | ICD-10-CM

## 2016-05-16 LAB — COMPREHENSIVE METABOLIC PANEL
ALT: 13 U/L (ref 0–55)
ANION GAP: 9 meq/L (ref 3–11)
AST: 18 U/L (ref 5–34)
Albumin: 2.7 g/dL — ABNORMAL LOW (ref 3.5–5.0)
Alkaline Phosphatase: 103 U/L (ref 40–150)
BILIRUBIN TOTAL: 0.49 mg/dL (ref 0.20–1.20)
BUN: 11.3 mg/dL (ref 7.0–26.0)
CALCIUM: 9 mg/dL (ref 8.4–10.4)
CHLORIDE: 94 meq/L — AB (ref 98–109)
CO2: 35 meq/L — AB (ref 22–29)
CREATININE: 0.7 mg/dL (ref 0.6–1.1)
EGFR: 83 mL/min/{1.73_m2} — AB (ref >90)
Glucose: 107 mg/dl (ref 70–140)
Potassium: 3.2 mEq/L — ABNORMAL LOW (ref 3.5–5.1)
Sodium: 138 mEq/L (ref 136–145)
Total Protein: 6.4 g/dL (ref 6.4–8.3)

## 2016-05-16 LAB — CBC WITH DIFFERENTIAL/PLATELET
BASO%: 0.1 % (ref 0.0–2.0)
BASOS ABS: 0 10*3/uL (ref 0.0–0.1)
EOS%: 1.8 % (ref 0.0–7.0)
Eosinophils Absolute: 0.2 10*3/uL (ref 0.0–0.5)
HEMATOCRIT: 28 % — AB (ref 34.8–46.6)
HGB: 8.2 g/dL — ABNORMAL LOW (ref 11.6–15.9)
LYMPH#: 0.7 10*3/uL — AB (ref 0.9–3.3)
LYMPH%: 6.6 % — AB (ref 14.0–49.7)
MCH: 21.2 pg — ABNORMAL LOW (ref 25.1–34.0)
MCHC: 29.3 g/dL — AB (ref 31.5–36.0)
MCV: 72.4 fL — AB (ref 79.5–101.0)
MONO#: 0.9 10*3/uL (ref 0.1–0.9)
MONO%: 9 % (ref 0.0–14.0)
NEUT#: 8.3 10*3/uL — ABNORMAL HIGH (ref 1.5–6.5)
NEUT%: 82.5 % — AB (ref 38.4–76.8)
PLATELETS: 277 10*3/uL (ref 145–400)
RBC: 3.87 10*6/uL (ref 3.70–5.45)
RDW: 17.2 % — ABNORMAL HIGH (ref 11.2–14.5)
WBC: 10 10*3/uL (ref 3.9–10.3)

## 2016-05-16 LAB — POCT INR: INR: 3.6

## 2016-05-16 LAB — TSH: TSH: 0.794 m(IU)/L (ref 0.308–3.960)

## 2016-05-16 MED ORDER — IOPAMIDOL (ISOVUE-300) INJECTION 61%
75.0000 mL | Freq: Once | INTRAVENOUS | Status: AC | PRN
  Administered 2016-05-16: 75 mL via INTRAVENOUS

## 2016-05-16 MED ORDER — IOPAMIDOL (ISOVUE-300) INJECTION 61%
INTRAVENOUS | Status: AC
  Filled 2016-05-16: qty 75

## 2016-05-17 ENCOUNTER — Telehealth: Payer: Self-pay | Admitting: Medical Oncology

## 2016-05-17 NOTE — Telephone Encounter (Signed)
Spoke to daughter and instructed her to have Marissa [name] add high in potassium foods to her diet such as bananas, milk, potatoes, lima beans,cantelope, tomatoes, ,oat ran , [place], cabbage.

## 2016-05-23 ENCOUNTER — Encounter: Payer: Self-pay | Admitting: Internal Medicine

## 2016-05-23 ENCOUNTER — Ambulatory Visit (HOSPITAL_BASED_OUTPATIENT_CLINIC_OR_DEPARTMENT_OTHER): Payer: Medicare Other | Admitting: Internal Medicine

## 2016-05-23 ENCOUNTER — Telehealth: Payer: Self-pay | Admitting: Internal Medicine

## 2016-05-23 VITALS — BP 151/65 | HR 105 | Temp 98.5°F | Resp 18 | Ht 63.0 in | Wt 109.9 lb

## 2016-05-23 DIAGNOSIS — Z7189 Other specified counseling: Secondary | ICD-10-CM

## 2016-05-23 DIAGNOSIS — C3431 Malignant neoplasm of lower lobe, right bronchus or lung: Secondary | ICD-10-CM

## 2016-05-23 DIAGNOSIS — D630 Anemia in neoplastic disease: Secondary | ICD-10-CM

## 2016-05-23 DIAGNOSIS — J90 Pleural effusion, not elsewhere classified: Secondary | ICD-10-CM

## 2016-05-23 DIAGNOSIS — Z5111 Encounter for antineoplastic chemotherapy: Secondary | ICD-10-CM

## 2016-05-23 DIAGNOSIS — F329 Major depressive disorder, single episode, unspecified: Secondary | ICD-10-CM | POA: Diagnosis not present

## 2016-05-23 DIAGNOSIS — R634 Abnormal weight loss: Secondary | ICD-10-CM | POA: Diagnosis not present

## 2016-05-23 DIAGNOSIS — Z5112 Encounter for antineoplastic immunotherapy: Secondary | ICD-10-CM

## 2016-05-23 DIAGNOSIS — C3411 Malignant neoplasm of upper lobe, right bronchus or lung: Secondary | ICD-10-CM | POA: Diagnosis not present

## 2016-05-23 HISTORY — DX: Encounter for antineoplastic chemotherapy: Z51.11

## 2016-05-23 HISTORY — DX: Other specified counseling: Z71.89

## 2016-05-23 MED ORDER — INTEGRA PLUS PO CAPS: 1.0000 | ORAL_CAPSULE | Freq: Every morning | ORAL | 2 refills | Status: AC

## 2016-05-23 MED ORDER — MIRTAZAPINE 30 MG PO TABS: 30.0000 mg | ORAL_TABLET | Freq: Every day | ORAL | 2 refills | Status: AC

## 2016-05-23 NOTE — Telephone Encounter (Signed)
Appointments scheduled per 3/5 LOS. Patient given AVS report and calendars with future scheduled appointments.

## 2016-05-23 NOTE — Progress Notes (Signed)
[facility] [phone]   [phone]  OFFICE PROGRESS NOTE  [name], MD [address] [place] [address]  DIAGNOSIS: Progressive non-small cell lung cancer initially diagnosed as a stage IIIA  (T2, N2, M0) squamous cell carcinoma presented with recurrence in the right upper lobe as well as mediastinal lymphadenopathy diagnosed in September 2016.  PRIOR THERAPY:  1) Concurrent chemoradiation with weekly carboplatin for AUC of 2 and paclitaxel 45 MG/M2, status post 6 cycles. 2)  Immunotherapy with Nivolumab to 40 mg IV every 2 weeks status post 5 cycles. First dose was given on 12/29/2015. Last dose was given on 02/19/2016 discontinued secondary to disease progression.  CURRENT THERAPY: Systemic chemotherapy with gemcitabine 800 MG/M2 on days 1 and 8 every 3 weeks. First dose 06/06/2016.  INTERVAL HISTORY: Marissa [name] 83 y.o. female came to the clinic today for follow-up visit accompanied by her daughter. The patient is feeling fine with no specific complaints except for fatigue and the baseline shortness of breath. She is currently on home oxygen. She denied having any chest pain but has cough with no hemoptysis. She has no significant weight loss or night sweats. She has no fever or chills. She has no nausea, vomiting, diarrhea or constipation. She has been observation for the last few months. She had repeat CT scan of the chest performed recently and she is here for evaluation and discussion of her scan results.  MEDICAL HISTORY: Past Medical History:  Diagnosis Date  . A-fib ([place])   . Aortic stenosis    (s/p AVR w/ 21 mm porcine valve 03/2011  . Aortic valve disorders   . Arthritis   . CAD (coronary artery disease)    s/p CABG 03/2011  . CHF (congestive heart failure) ([facility])   . COPD (chronic obstructive pulmonary disease) ([facility])   . Dehydration 02/22/2016  . Diabetes mellitus without complication ([facility])   .  Dysrhythmia   . Encounter for antineoplastic immunotherapy 12/21/2015  . Full code status 12/22/2014  . Full code status 12/22/2014  . GERD (gastroesophageal reflux disease)    takes nexium prn  . History of blood transfusion    Rheumatic Fever  . HTN (hypertension)   . Hypercholesteremia   . Hypothyroidism   . Lung cancer ([facility])   . Mastoiditis   . Mitral valve disease    s/p MVR 03/2011 w/ 27 mm porcine valve  . Myocardial infarction    "slight"  . Osteopenia   . Pneumonia   . Pre-diabetes   . Recurrent right pleural effusion 03/08/2016  . RLS (restless legs syndrome)   . Shortness of breath    with exertion  . Weight loss 02/22/2016    ALLERGIES:  is allergic to prozac [fluoxetine hcl].  MEDICATIONS:  Current Outpatient Prescriptions  Medication Sig Dispense Refill  . albuterol (PROVENTIL) (2.5 MG/3ML) 0.083% nebulizer solution Inhale 3 mLs into the lungs 3 (three) times daily.    [name] alendronate (FOSAMAX) 70 MG tablet Take 70 mg by mouth once a week.     [name] aspirin EC 81 MG tablet Take 81 mg by mouth every morning.    [name] atorvastatin (LIPITOR) 40 MG tablet Take 40 mg by mouth daily. For HLD    . CALCIUM CARBONATE PO Take 1 tablet by mouth 2 (two) times daily.    [name] esomeprazole (NEXIUM) 40 MG capsule Take 40 mg by mouth daily as needed (for acid reflux).     [name]  fluticasone-salmeterol (ADVAIR HFA) 115-21 MCG/ACT inhaler Inhale 2 puffs into the lungs 2 (two) times daily.     . furosemide (LASIX) 20 MG tablet Take 1 tablet (20 mg total) by mouth 2 (two) times daily. 30 tablet 0  . Ipratropium-Albuterol (COMBIVENT) 20-100 MCG/ACT AERS respimat Inhale 1 puff into the lungs every 6 (six) hours as needed for wheezing or shortness of breath.     [name] ipratropium-albuterol (DUONEB) 0.5-2.5 (3) MG/3ML SOLN Take 3 mLs by nebulization every 6 (six) hours as needed (for shortness of breath).     [name] levothyroxine (SYNTHROID, LEVOTHROID) 100 MCG tablet Take 100 mcg by mouth daily before breakfast.       . Multiple Vitamin (MULTIVITAMIN WITH MINERALS) TABS tablet Take 1 tablet by mouth daily.    . potassium chloride SA (K-DUR,KLOR-CON) 20 MEQ tablet Take 1 tablet (20 mEq total) by mouth daily. 7 tablet 0  . rOPINIRole (REQUIP) 4 MG tablet Take 4 mg by mouth at bedtime.    . TOPROL XL 50 MG 24 hr tablet TAKE 1 TABLET EVERY EVENING (DISCONTINUE CARVEDIOLOL PRESCRIPTION) 90 tablet 3  . vitamin C (ASCORBIC ACID) 500 MG tablet Take 500 mg by mouth daily.    [name] warfarin (COUMADIN) 1 MG tablet Take as directed by [facility] 200 tablet 1  . acetaminophen (TYLENOL) 325 MG tablet Take 2 tablets (650 mg total) by mouth every 6 (six) hours as needed for mild pain (or Fever >/= 101). (Patient not taking: Reported on 05/23/2016)    . mirtazapine (REMERON) 30 MG tablet Take 1 tablet (30 mg total) by mouth at bedtime. (Patient not taking: Reported on 05/23/2016) 30 tablet 0   No current facility-administered medications for this visit.     SURGICAL HISTORY:  Past Surgical History:  Procedure Laterality Date  . AORTIC VALVE REPLACEMENT    . COMPRESSION HIP SCREW Left 12/26/2014   Procedure: ORIF LEFT HIP INTERTROCHANTRIC FRACTURE;  Surgeon: Frederik Pear, MD;  Location: [facility];  Service: Orthopedics;  Laterality: Left;  . CORONARY ARTERY BYPASS GRAFT  03/2011   1990's also  . LAPAROSCOPIC CHOLECYSTECTOMY    . MITRAL VALVE REPLACEMENT     1990's  . TONSILLECTOMY    . VIDEO BRONCHOSCOPY WITH ENDOBRONCHIAL NAVIGATION N/A 01/06/2014   Procedure: VIDEO BRONCHOSCOPY WITH ENDOBRONCHIAL NAVIGATION;  Surgeon: [name], MD;  Location: [facility];  Service: Thoracic;  Laterality: N/A;  . VIDEO BRONCHOSCOPY WITH ENDOBRONCHIAL NAVIGATION N/A 11/21/2014   Procedure: VIDEO BRONCHOSCOPY WITH ENDOBRONCHIAL NAVIGATION;  Surgeon: [name], MD;  Location: [facility];  Service: Thoracic;  Laterality: N/A;  . VIDEO BRONCHOSCOPY WITH ENDOBRONCHIAL ULTRASOUND N/A 01/06/2014   Procedure: VIDEO BRONCHOSCOPY WITH  ENDOBRONCHIAL ULTRASOUND;  Surgeon: [name], MD;  Location: [facility];  Service: Thoracic;  Laterality: N/A;  . VIDEO BRONCHOSCOPY WITH ENDOBRONCHIAL ULTRASOUND N/A 11/21/2014   Procedure: VIDEO BRONCHOSCOPY WITH ENDOBRONCHIAL ULTRASOUND;  Surgeon: [name], MD;  Location: [facility];  Service: Thoracic;  Laterality: N/A;    REVIEW OF SYSTEMS:  Constitutional: positive for fatigue Eyes: negative Ears, nose, mouth, throat, and face: negative Respiratory: positive for cough, dyspnea on exertion and wheezing Cardiovascular: negative Gastrointestinal: negative Genitourinary:negative Integument/breast: negative Hematologic/lymphatic: negative Musculoskeletal:positive for muscle weakness Neurological: negative Behavioral/Psych: negative Endocrine: negative Allergic/Immunologic: negative   PHYSICAL EXAMINATION: General appearance: alert, cooperative, fatigued and mild distress Head: Normocephalic, without obvious abnormality, atraumatic Neck: no adenopathy, no JVD, supple, symmetrical, trachea midline and thyroid not enlarged, symmetric, no tenderness/mass/nodules Lymph nodes: Cervical, supraclavicular,  and axillary nodes normal. Resp: diminished breath sounds RLL, dullness to percussion RLL and wheezes bilaterally Back: symmetric, no curvature. ROM normal. No CVA tenderness. Cardio: regular rate and rhythm, S1, S2 normal, no murmur, click, rub or gallop GI: soft, non-tender; bowel sounds normal; no masses,  no organomegaly Extremities: extremities normal, atraumatic, no cyanosis or edema Neurologic: Alert and oriented X 3, normal strength and tone. Normal symmetric reflexes. Normal coordination and gait  ECOG PERFORMANCE STATUS: 2 - Symptomatic, <50% confined to bed  Blood pressure (!) 151/65, pulse (!) 105, temperature 98.5 F (36.9 C), temperature source Oral, resp. rate 18, height '5\' 3"'$  (1.6 m), SpO2 97 %.  LABORATORY DATA: Lab Results  Component Value Date   WBC  10.0 05/16/2016   HGB 8.2 (L) 05/16/2016   HCT 28.0 (L) 05/16/2016   MCV 72.4 (L) 05/16/2016   PLT 277 05/16/2016      Chemistry      Component Value Date/Time   NA 138 05/16/2016 0823   K 3.2 (L) 05/16/2016 0823   CL 95 (L) 03/28/2016 0920   CL 98 04/17/2014 0409   CO2 35 (H) 05/16/2016 0823   BUN 11.3 05/16/2016 0823   CREATININE 0.7 05/16/2016 0823   GLU 177 (H) 02/08/2016 1511      Component Value Date/Time   CALCIUM 9.0 05/16/2016 0823   ALKPHOS 103 05/16/2016 0823   AST 18 05/16/2016 0823   ALT 13 05/16/2016 0823   BILITOT 0.49 05/16/2016 0823       RADIOGRAPHIC STUDIES: Ct Chest W Contrast  Result Date: 05/16/2016 CLINICAL DATA:  Restaging non small cell right lung cancer, diagnosed 11/2013, chemotherapy/ XRT complete, last immunotherapy 01/2016 EXAM: CT CHEST WITH CONTRAST TECHNIQUE: Multidetector CT imaging of the chest was performed during intravenous contrast administration. CONTRAST:  27m ISOVUE-300 IOPAMIDOL (ISOVUE-300) INJECTION 61% COMPARISON:  02/22/2016 FINDINGS: Cardiovascular: Cardiomegaly with left atrial enlargement. Right posterior/ superior aspect of the left atrium, measuring approximately 1.4 x 3.9 cm (series 2/ image 76), previously 1.4 x 4.1 cm when measured in a similar fashion, grossly unchanged. Three vessel coronary atherosclerosis. Mitral and aortic valve prostheses. Atherosclerotic calcifications of the aortic arch. Mediastinum/Nodes: 2.7 cm short axis right supraclavicular node (series 2/ image 33), previously 1.7 cm. 1.4 cm short axis subcarinal node, previously 1.6 cm. Prior low right paratracheal node is poorly visualized on the current study (series 2/ image 61). Visualized thyroid is grossly unremarkable. Lungs/Pleura: Left lung is essentially clear, noting mild scarring/ atelectasis in the left lower lobe. Radiation changes in the right upper and middle lobes. Underlying right middle lobe atelectasis/volume loss. 4.8 x 5.6 cm necrotic mass  along the anterior aspect of the right middle lobe (series 2/image 70), previously 2.8 x 2.0 cm, now with anterior chest wall invasion. Moderate right pleural effusion. No pneumothorax. Upper Abdomen: Visualized upper abdomen is notable for cholecystectomy clips. Musculoskeletal: Tumor involvement involving the right anterior 3rd rib (series 2/ image 66). Stable cortical changes involving the lateral right 3rd and 4th ribs (series 2/ image 41), likely reflecting radiation changes. Mild degenerative changes of the visualized thoracolumbar spine. Median sternotomy. IMPRESSION: 4.8 x 5.6 cm necrotic mass along the anterior right middle lobe, compatible with recurrent tumor/metastasis, progressed. Interval chest wall invasion with involvement of the anterior right 3rd rib. Associated progression of thoracic nodal metastases, including a dominant 2.7 cm short axis right supraclavicular node, as above. Underlying radiation changes in the right hemithorax. Moderate right pleural effusion, grossly unchanged. Tumor versus bland thrombus in  the left atrium, grossly unchanged. Additional postsurgical and ancillary findings, as above. Electronically Signed   By: Julian Hy M.D.   On: 05/16/2016 11:16    ASSESSMENT AND PLAN:  This is a very pleasant 83 years old white female with progressive non-small cell lung cancer, squamous cell carcinoma with negative PDL 1 expression. The patient is status post course of concurrent chemoradiation with weekly carboplatin and paclitaxel with partial response followed by disease progression. She was started on treatment immunotherapy with Nivolumab for 5 cycles discontinued secondary to disease progression. She has been on observation for the last few months. She had a recent CT scan of the chest. I personally and independently reviewed the scan images and discuss the results with the patient and her daughter. Unfortunately her scan showed evidence for disease progression. I  had a lengthy discussion with the patient and her daughter about her condition and goals of care. I gave her the option of palliative care and hospice referral versus consideration of treatment with single agent gemcitabine at a reduced dose 800 MG/M2 on days 1 and 8 every 3 weeks. The patient had a very interested in proceeding with treatment and she is not considering hospice care at this point. I discussed with her the adverse effect of this treatment including but not limited to alopecia, myelosuppression, nausea and vomiting, peripheral neuropathy, liver or renal dysfunction. She is expected to start the first dose of this treatment on 06/06/2016 because of her daughter's schedule. For the anemia of neoplastic disease, I strongly recommended for the patient to start taking oral iron tablets and negative her prescription for Integra plus one capsule by mouth daily. For depression and weight loss, I gave her refill of Remeron. The patient would come back for follow-up visit in 5 weeks for evaluation before starting cycle #2. She was advised to call immediately if she has any concerning symptoms in the interval. The patient voices understanding of current disease status and treatment options and is in agreement with the current care plan.  All questions were answered. The patient knows to call the clinic with any problems, questions or concerns. We can certainly see the patient much sooner if necessary.  Disclaimer: This note was dictated with voice recognition software. Similar sounding words can inadvertently be transcribed and may not be corrected upon review.

## 2016-05-23 NOTE — Progress Notes (Signed)
DISCONTINUE OFF PATHWAY REGIMEN - Non-Small Cell Lung  Other Clinical Trial: Nivolumab  REASON: Disease Progression PRIOR TREATMENT: Other Trial - Nivolumab TREATMENT RESPONSE: Progressive Disease (PD)  START OFF PATHWAY REGIMEN - Non-Small Cell Lung   [id number]:Gemcitabine 1,000 mg/m2 D1, 8  q21 Days:   A cycle is every 21 days:     Gemcitabine        Dose Mod: None  **Always confirm dose/schedule in your pharmacy ordering system**    Patient Characteristics: Local Recurrence AJCC T Category: T2b Current Disease Status: Local Recurrence AJCC N Category: N2 AJCC M Category: M0 AJCC 8 Stage Grouping: IIIA  Intent of Therapy: Non-Curative / Palliative Intent, Discussed with Patient

## 2016-05-30 ENCOUNTER — Ambulatory Visit (HOSPITAL_COMMUNITY): Payer: Medicare Other

## 2016-06-05 ENCOUNTER — Encounter (HOSPITAL_COMMUNITY): Payer: Self-pay | Admitting: Emergency Medicine

## 2016-06-05 ENCOUNTER — Inpatient Hospital Stay (HOSPITAL_COMMUNITY): Payer: Medicare Other

## 2016-06-05 ENCOUNTER — Inpatient Hospital Stay (HOSPITAL_COMMUNITY)
Admission: EM | Admit: 2016-06-05 | Discharge: 2016-06-19 | DRG: 871 | Disposition: E | Payer: Medicare Other | Attending: Internal Medicine | Admitting: Internal Medicine

## 2016-06-05 ENCOUNTER — Emergency Department (HOSPITAL_COMMUNITY): Payer: Medicare Other

## 2016-06-05 DIAGNOSIS — E876 Hypokalemia: Secondary | ICD-10-CM | POA: Diagnosis present

## 2016-06-05 DIAGNOSIS — Z7982 Long term (current) use of aspirin: Secondary | ICD-10-CM

## 2016-06-05 DIAGNOSIS — R069 Unspecified abnormalities of breathing: Secondary | ICD-10-CM | POA: Diagnosis not present

## 2016-06-05 DIAGNOSIS — N179 Acute kidney failure, unspecified: Secondary | ICD-10-CM | POA: Diagnosis present

## 2016-06-05 DIAGNOSIS — A419 Sepsis, unspecified organism: Secondary | ICD-10-CM | POA: Diagnosis not present

## 2016-06-05 DIAGNOSIS — Z681 Body mass index (BMI) 19 or less, adult: Secondary | ICD-10-CM

## 2016-06-05 DIAGNOSIS — C771 Secondary and unspecified malignant neoplasm of intrathoracic lymph nodes: Secondary | ICD-10-CM | POA: Diagnosis present

## 2016-06-05 DIAGNOSIS — C3491 Malignant neoplasm of unspecified part of right bronchus or lung: Secondary | ICD-10-CM | POA: Diagnosis not present

## 2016-06-05 DIAGNOSIS — Z789 Other specified health status: Secondary | ICD-10-CM | POA: Diagnosis not present

## 2016-06-05 DIAGNOSIS — R7881 Bacteremia: Secondary | ICD-10-CM | POA: Diagnosis present

## 2016-06-05 DIAGNOSIS — J9600 Acute respiratory failure, unspecified whether with hypoxia or hypercapnia: Secondary | ICD-10-CM | POA: Diagnosis not present

## 2016-06-05 DIAGNOSIS — M8580 Other specified disorders of bone density and structure, unspecified site: Secondary | ICD-10-CM | POA: Diagnosis present

## 2016-06-05 DIAGNOSIS — I110 Hypertensive heart disease with heart failure: Secondary | ICD-10-CM | POA: Diagnosis present

## 2016-06-05 DIAGNOSIS — L89151 Pressure ulcer of sacral region, stage 1: Secondary | ICD-10-CM | POA: Diagnosis present

## 2016-06-05 DIAGNOSIS — G2581 Restless legs syndrome: Secondary | ICD-10-CM | POA: Diagnosis present

## 2016-06-05 DIAGNOSIS — C3411 Malignant neoplasm of upper lobe, right bronchus or lung: Secondary | ICD-10-CM | POA: Diagnosis not present

## 2016-06-05 DIAGNOSIS — Z515 Encounter for palliative care: Secondary | ICD-10-CM | POA: Diagnosis not present

## 2016-06-05 DIAGNOSIS — Z9989 Dependence on other enabling machines and devices: Secondary | ICD-10-CM | POA: Diagnosis not present

## 2016-06-05 DIAGNOSIS — J189 Pneumonia, unspecified organism: Secondary | ICD-10-CM | POA: Diagnosis not present

## 2016-06-05 DIAGNOSIS — L8990 Pressure ulcer of unspecified site, unspecified stage: Secondary | ICD-10-CM | POA: Diagnosis present

## 2016-06-05 DIAGNOSIS — Z888 Allergy status to other drugs, medicaments and biological substances status: Secondary | ICD-10-CM | POA: Diagnosis not present

## 2016-06-05 DIAGNOSIS — Z66 Do not resuscitate: Secondary | ICD-10-CM | POA: Diagnosis not present

## 2016-06-05 DIAGNOSIS — J9621 Acute and chronic respiratory failure with hypoxia: Secondary | ICD-10-CM | POA: Diagnosis not present

## 2016-06-05 DIAGNOSIS — J449 Chronic obstructive pulmonary disease, unspecified: Secondary | ICD-10-CM | POA: Diagnosis present

## 2016-06-05 DIAGNOSIS — Z952 Presence of prosthetic heart valve: Secondary | ICD-10-CM | POA: Diagnosis not present

## 2016-06-05 DIAGNOSIS — I2510 Atherosclerotic heart disease of native coronary artery without angina pectoris: Secondary | ICD-10-CM | POA: Diagnosis present

## 2016-06-05 DIAGNOSIS — K559 Vascular disorder of intestine, unspecified: Secondary | ICD-10-CM | POA: Diagnosis present

## 2016-06-05 DIAGNOSIS — E872 Acidosis: Secondary | ICD-10-CM | POA: Diagnosis present

## 2016-06-05 DIAGNOSIS — Z87891 Personal history of nicotine dependence: Secondary | ICD-10-CM | POA: Diagnosis not present

## 2016-06-05 DIAGNOSIS — Z823 Family history of stroke: Secondary | ICD-10-CM | POA: Diagnosis not present

## 2016-06-05 DIAGNOSIS — I700 Atherosclerosis of aorta: Secondary | ICD-10-CM | POA: Diagnosis not present

## 2016-06-05 DIAGNOSIS — I4891 Unspecified atrial fibrillation: Secondary | ICD-10-CM | POA: Diagnosis present

## 2016-06-05 DIAGNOSIS — C3490 Malignant neoplasm of unspecified part of unspecified bronchus or lung: Secondary | ICD-10-CM

## 2016-06-05 DIAGNOSIS — C342 Malignant neoplasm of middle lobe, bronchus or lung: Secondary | ICD-10-CM | POA: Diagnosis present

## 2016-06-05 DIAGNOSIS — J15211 Pneumonia due to Methicillin susceptible Staphylococcus aureus: Secondary | ICD-10-CM | POA: Diagnosis not present

## 2016-06-05 DIAGNOSIS — G92 Toxic encephalopathy: Secondary | ICD-10-CM | POA: Diagnosis present

## 2016-06-05 DIAGNOSIS — R6520 Severe sepsis without septic shock: Secondary | ICD-10-CM | POA: Diagnosis not present

## 2016-06-05 DIAGNOSIS — E11649 Type 2 diabetes mellitus with hypoglycemia without coma: Secondary | ICD-10-CM | POA: Diagnosis not present

## 2016-06-05 DIAGNOSIS — Z9221 Personal history of antineoplastic chemotherapy: Secondary | ICD-10-CM

## 2016-06-05 DIAGNOSIS — I5032 Chronic diastolic (congestive) heart failure: Secondary | ICD-10-CM | POA: Diagnosis present

## 2016-06-05 DIAGNOSIS — R0603 Acute respiratory distress: Secondary | ICD-10-CM | POA: Diagnosis not present

## 2016-06-05 DIAGNOSIS — J910 Malignant pleural effusion: Secondary | ICD-10-CM | POA: Diagnosis present

## 2016-06-05 DIAGNOSIS — J918 Pleural effusion in other conditions classified elsewhere: Secondary | ICD-10-CM | POA: Diagnosis not present

## 2016-06-05 DIAGNOSIS — E039 Hypothyroidism, unspecified: Secondary | ICD-10-CM | POA: Diagnosis present

## 2016-06-05 DIAGNOSIS — Z953 Presence of xenogenic heart valve: Secondary | ICD-10-CM

## 2016-06-05 DIAGNOSIS — Z801 Family history of malignant neoplasm of trachea, bronchus and lung: Secondary | ICD-10-CM | POA: Diagnosis not present

## 2016-06-05 DIAGNOSIS — D638 Anemia in other chronic diseases classified elsewhere: Secondary | ICD-10-CM | POA: Diagnosis present

## 2016-06-05 DIAGNOSIS — Z9981 Dependence on supplemental oxygen: Secondary | ICD-10-CM

## 2016-06-05 DIAGNOSIS — J90 Pleural effusion, not elsewhere classified: Secondary | ICD-10-CM | POA: Diagnosis present

## 2016-06-05 DIAGNOSIS — J440 Chronic obstructive pulmonary disease with acute lower respiratory infection: Secondary | ICD-10-CM | POA: Diagnosis present

## 2016-06-05 DIAGNOSIS — D508 Other iron deficiency anemias: Secondary | ICD-10-CM | POA: Diagnosis not present

## 2016-06-05 DIAGNOSIS — Z7189 Other specified counseling: Secondary | ICD-10-CM

## 2016-06-05 DIAGNOSIS — G9340 Encephalopathy, unspecified: Secondary | ICD-10-CM | POA: Diagnosis present

## 2016-06-05 DIAGNOSIS — R6521 Severe sepsis with septic shock: Secondary | ICD-10-CM | POA: Diagnosis present

## 2016-06-05 DIAGNOSIS — C3431 Malignant neoplasm of lower lobe, right bronchus or lung: Secondary | ICD-10-CM | POA: Diagnosis not present

## 2016-06-05 DIAGNOSIS — J9622 Acute and chronic respiratory failure with hypercapnia: Secondary | ICD-10-CM | POA: Diagnosis not present

## 2016-06-05 DIAGNOSIS — A4101 Sepsis due to Methicillin susceptible Staphylococcus aureus: Secondary | ICD-10-CM | POA: Diagnosis not present

## 2016-06-05 DIAGNOSIS — R64 Cachexia: Secondary | ICD-10-CM | POA: Diagnosis present

## 2016-06-05 DIAGNOSIS — R0602 Shortness of breath: Secondary | ICD-10-CM | POA: Diagnosis not present

## 2016-06-05 DIAGNOSIS — K219 Gastro-esophageal reflux disease without esophagitis: Secondary | ICD-10-CM | POA: Diagnosis present

## 2016-06-05 DIAGNOSIS — Z8249 Family history of ischemic heart disease and other diseases of the circulatory system: Secondary | ICD-10-CM | POA: Diagnosis not present

## 2016-06-05 DIAGNOSIS — Z9049 Acquired absence of other specified parts of digestive tract: Secondary | ICD-10-CM

## 2016-06-05 DIAGNOSIS — E119 Type 2 diabetes mellitus without complications: Secondary | ICD-10-CM

## 2016-06-05 DIAGNOSIS — C7989 Secondary malignant neoplasm of other specified sites: Secondary | ICD-10-CM | POA: Diagnosis present

## 2016-06-05 DIAGNOSIS — E878 Other disorders of electrolyte and fluid balance, not elsewhere classified: Secondary | ICD-10-CM

## 2016-06-05 DIAGNOSIS — Z7951 Long term (current) use of inhaled steroids: Secondary | ICD-10-CM

## 2016-06-05 DIAGNOSIS — E7800 Pure hypercholesterolemia, unspecified: Secondary | ICD-10-CM | POA: Diagnosis present

## 2016-06-05 DIAGNOSIS — Z951 Presence of aortocoronary bypass graft: Secondary | ICD-10-CM

## 2016-06-05 DIAGNOSIS — Z7901 Long term (current) use of anticoagulants: Secondary | ICD-10-CM

## 2016-06-05 DIAGNOSIS — Z79899 Other long term (current) drug therapy: Secondary | ICD-10-CM

## 2016-06-05 DIAGNOSIS — R579 Shock, unspecified: Secondary | ICD-10-CM | POA: Diagnosis present

## 2016-06-05 DIAGNOSIS — J438 Other emphysema: Secondary | ICD-10-CM | POA: Diagnosis not present

## 2016-06-05 DIAGNOSIS — R000 Tachycardia, unspecified: Secondary | ICD-10-CM | POA: Diagnosis not present

## 2016-06-05 LAB — URINALYSIS, ROUTINE W REFLEX MICROSCOPIC
BILIRUBIN URINE: NEGATIVE
Bacteria, UA: NONE SEEN
Glucose, UA: NEGATIVE mg/dL
Hgb urine dipstick: NEGATIVE
KETONES UR: NEGATIVE mg/dL
Leukocytes, UA: NEGATIVE
Nitrite: NEGATIVE
Protein, ur: 100 mg/dL — AB
SPECIFIC GRAVITY, URINE: 1.018 (ref 1.005–1.030)
pH: 5 (ref 5.0–8.0)

## 2016-06-05 LAB — CBC WITH DIFFERENTIAL/PLATELET
Basophils Absolute: 0 10*3/uL (ref 0.0–0.1)
Basophils Relative: 0 %
EOS PCT: 0 %
Eosinophils Absolute: 0 10*3/uL (ref 0.0–0.7)
HEMATOCRIT: 30.1 % — AB (ref 36.0–46.0)
Hemoglobin: 8.6 g/dL — ABNORMAL LOW (ref 12.0–15.0)
LYMPHS ABS: 0.3 10*3/uL — AB (ref 0.7–4.0)
Lymphocytes Relative: 1 %
MCH: 20.4 pg — ABNORMAL LOW (ref 26.0–34.0)
MCHC: 28.6 g/dL — AB (ref 30.0–36.0)
MCV: 71.3 fL — ABNORMAL LOW (ref 78.0–100.0)
Monocytes Absolute: 1.1 10*3/uL — ABNORMAL HIGH (ref 0.1–1.0)
Monocytes Relative: 4 %
NEUTROS ABS: 25.1 10*3/uL — AB (ref 1.7–7.7)
Neutrophils Relative %: 95 %
Platelets: 287 10*3/uL (ref 150–400)
RBC: 4.22 MIL/uL (ref 3.87–5.11)
RDW: 17.5 % — AB (ref 11.5–15.5)
WBC Morphology: INCREASED
WBC: 26.5 10*3/uL — ABNORMAL HIGH (ref 4.0–10.5)

## 2016-06-05 LAB — CBC
HCT: 23.6 % — ABNORMAL LOW (ref 36.0–46.0)
HCT: 25.3 % — ABNORMAL LOW (ref 36.0–46.0)
HEMATOCRIT: 24.9 % — AB (ref 36.0–46.0)
HEMATOCRIT: 25.4 % — AB (ref 36.0–46.0)
HEMOGLOBIN: 6.8 g/dL — AB (ref 12.0–15.0)
HEMOGLOBIN: 7.4 g/dL — AB (ref 12.0–15.0)
Hemoglobin: 7.1 g/dL — ABNORMAL LOW (ref 12.0–15.0)
Hemoglobin: 7.3 g/dL — ABNORMAL LOW (ref 12.0–15.0)
MCH: 20.4 pg — AB (ref 26.0–34.0)
MCH: 20 pg — ABNORMAL LOW (ref 26.0–34.0)
MCH: 21 pg — AB (ref 26.0–34.0)
MCH: 20.8 pg — AB (ref 26.0–34.0)
MCHC: 28.8 g/dL — AB (ref 30.0–36.0)
MCHC: 28.1 g/dL — AB (ref 30.0–36.0)
MCHC: 29.3 g/dL — ABNORMAL LOW (ref 30.0–36.0)
MCHC: 29.1 g/dL — AB (ref 30.0–36.0)
MCV: 70.9 fL — ABNORMAL LOW (ref 78.0–100.0)
MCV: 71.3 fL — ABNORMAL LOW (ref 78.0–100.0)
MCV: 71.8 fL — ABNORMAL LOW (ref 78.0–100.0)
MCV: 71.5 fL — AB (ref 78.0–100.0)
PLATELETS: 215 10*3/uL (ref 150–400)
Platelets: 222 10*3/uL (ref 150–400)
Platelets: 249 10*3/uL (ref 150–400)
Platelets: 227 10*3/uL (ref 150–400)
RBC: 3.33 MIL/uL — ABNORMAL LOW (ref 3.87–5.11)
RBC: 3.55 MIL/uL — ABNORMAL LOW (ref 3.87–5.11)
RBC: 3.47 MIL/uL — AB (ref 3.87–5.11)
RBC: 3.55 MIL/uL — ABNORMAL LOW (ref 3.87–5.11)
RDW: 17.5 % — ABNORMAL HIGH (ref 11.5–15.5)
RDW: 17.3 % — AB (ref 11.5–15.5)
RDW: 17.8 % — ABNORMAL HIGH (ref 11.5–15.5)
RDW: 17.7 % — AB (ref 11.5–15.5)
WBC: 27.8 10*3/uL — ABNORMAL HIGH (ref 4.0–10.5)
WBC: 28 10*3/uL — ABNORMAL HIGH (ref 4.0–10.5)
WBC: 20.1 10*3/uL — ABNORMAL HIGH (ref 4.0–10.5)
WBC: 18.6 10*3/uL — ABNORMAL HIGH (ref 4.0–10.5)

## 2016-06-05 LAB — RESPIRATORY PANEL BY PCR
Adenovirus: NOT DETECTED
BORDETELLA PERTUSSIS-RVPCR: NOT DETECTED
CORONAVIRUS HKU1-RVPPCR: NOT DETECTED
Chlamydophila pneumoniae: NOT DETECTED
Coronavirus 229E: NOT DETECTED
Coronavirus NL63: NOT DETECTED
Coronavirus OC43: NOT DETECTED
INFLUENZA B-RVPPCR: NOT DETECTED
Influenza A: NOT DETECTED
METAPNEUMOVIRUS-RVPPCR: NOT DETECTED
Mycoplasma pneumoniae: NOT DETECTED
PARAINFLUENZA VIRUS 2-RVPPCR: NOT DETECTED
PARAINFLUENZA VIRUS 3-RVPPCR: NOT DETECTED
Parainfluenza Virus 1: NOT DETECTED
Parainfluenza Virus 4: NOT DETECTED
RESPIRATORY SYNCYTIAL VIRUS-RVPPCR: NOT DETECTED
RHINOVIRUS / ENTEROVIRUS - RVPPCR: NOT DETECTED

## 2016-06-05 LAB — COMPREHENSIVE METABOLIC PANEL
ALT: 14 U/L (ref 14–54)
ALT: 12 U/L — ABNORMAL LOW (ref 14–54)
ANION GAP: 10 (ref 5–15)
AST: 27 U/L (ref 15–41)
AST: 24 U/L (ref 15–41)
Albumin: 2.9 g/dL — ABNORMAL LOW (ref 3.5–5.0)
Albumin: 2.2 g/dL — ABNORMAL LOW (ref 3.5–5.0)
Alkaline Phosphatase: 137 U/L — ABNORMAL HIGH (ref 38–126)
Alkaline Phosphatase: 111 U/L (ref 38–126)
Anion gap: 9 (ref 5–15)
BUN: 14 mg/dL (ref 6–20)
BUN: 15 mg/dL (ref 6–20)
CHLORIDE: 91 mmol/L — AB (ref 101–111)
CHLORIDE: 93 mmol/L — AB (ref 101–111)
CO2: 34 mmol/L — AB (ref 22–32)
CO2: 29 mmol/L (ref 22–32)
CREATININE: 0.99 mg/dL (ref 0.44–1.00)
Calcium: 8.9 mg/dL (ref 8.9–10.3)
Calcium: 7.5 mg/dL — ABNORMAL LOW (ref 8.9–10.3)
Creatinine, Ser: 1.18 mg/dL — ABNORMAL HIGH (ref 0.44–1.00)
GFR calc Af Amer: >60 mL/min (ref >60)
GFR calc non Af Amer: 52 mL/min — ABNORMAL LOW (ref >60)
GFR calc non Af Amer: 42 mL/min — ABNORMAL LOW (ref >60)
GFR, EST AFRICAN AMERICAN: 49 mL/min — AB (ref >60)
Glucose, Bld: 98 mg/dL (ref 65–99)
Glucose, Bld: 124 mg/dL — ABNORMAL HIGH (ref 65–99)
Potassium: 3.7 mmol/L (ref 3.5–5.1)
Potassium: 3.2 mmol/L — ABNORMAL LOW (ref 3.5–5.1)
SODIUM: 134 mmol/L — AB (ref 135–145)
SODIUM: 132 mmol/L — AB (ref 135–145)
Total Bilirubin: 1 mg/dL (ref 0.3–1.2)
Total Bilirubin: 0.8 mg/dL (ref 0.3–1.2)
Total Protein: 6.7 g/dL (ref 6.5–8.1)
Total Protein: 5.2 g/dL — ABNORMAL LOW (ref 6.5–8.1)

## 2016-06-05 LAB — I-STAT ARTERIAL BLOOD GAS, ED
ACID-BASE EXCESS: 9 mmol/L — AB (ref 0.0–2.0)
BICARBONATE: 34.3 mmol/L — AB (ref 20.0–28.0)
O2 SAT: 98 %
PH ART: 7.386 (ref 7.350–7.450)
Patient temperature: 105.2
TCO2: 36 mmol/L (ref 0–100)
pCO2 arterial: 59.2 mmHg — ABNORMAL HIGH (ref 32.0–48.0)
pO2, Arterial: 137 mmHg — ABNORMAL HIGH (ref 83.0–108.0)

## 2016-06-05 LAB — PROTIME-INR
INR: 2.91
INR: 2.81
PROTHROMBIN TIME: 31 s — AB (ref 11.4–15.2)
PROTHROMBIN TIME: 30.2 s — AB (ref 11.4–15.2)

## 2016-06-05 LAB — TROPONIN I
TROPONIN I: 0.03 ng/mL — AB (ref <0.03)
Troponin I: 0.05 ng/mL (ref <0.03)

## 2016-06-05 LAB — I-STAT CG4 LACTIC ACID, ED
Lactic Acid, Venous: 2.82 mmol/L (ref 0.5–1.9)
Lactic Acid, Venous: 1.5 mmol/L (ref 0.5–1.9)

## 2016-06-05 LAB — LACTIC ACID, PLASMA: Lactic Acid, Venous: 0.9 mmol/L (ref 0.5–1.9)

## 2016-06-05 LAB — TYPE AND SCREEN
ABO/RH(D): A POS
Antibody Screen: NEGATIVE

## 2016-06-05 LAB — PHOSPHORUS: PHOSPHORUS: 3.6 mg/dL (ref 2.5–4.6)

## 2016-06-05 LAB — APTT: APTT: 56 s — AB (ref 24–36)

## 2016-06-05 LAB — PROCALCITONIN: PROCALCITONIN: 24.46 ng/mL

## 2016-06-05 LAB — BRAIN NATRIURETIC PEPTIDE: B Natriuretic Peptide: 1065 pg/mL — ABNORMAL HIGH (ref 0.0–100.0)

## 2016-06-05 LAB — ABO/RH: ABO/RH(D): A POS

## 2016-06-05 LAB — AMYLASE: AMYLASE: 31 U/L (ref 28–100)

## 2016-06-05 LAB — STREP PNEUMONIAE URINARY ANTIGEN: Strep Pneumo Urinary Antigen: NEGATIVE

## 2016-06-05 LAB — INFLUENZA PANEL BY PCR (TYPE A & B)
Influenza A By PCR: NEGATIVE
Influenza B By PCR: NEGATIVE

## 2016-06-05 LAB — GLUCOSE, CAPILLARY: Glucose-Capillary: 94 mg/dL (ref 65–99)

## 2016-06-05 LAB — MAGNESIUM: Magnesium: 1.3 mg/dL — ABNORMAL LOW (ref 1.7–2.4)

## 2016-06-05 LAB — MRSA PCR SCREENING: MRSA BY PCR: NEGATIVE

## 2016-06-05 MED ORDER — SODIUM CHLORIDE 0.9 % IV BOLUS (SEPSIS)
500.0000 mL | Freq: Once | INTRAVENOUS | Status: AC
  Administered 2016-06-05: 500 mL via INTRAVENOUS

## 2016-06-05 MED ORDER — SODIUM CHLORIDE 0.9 % IV SOLN: 250.0000 mL | INTRAVENOUS | Status: DC | PRN

## 2016-06-05 MED ORDER — VANCOMYCIN HCL IN DEXTROSE 750-5 MG/150ML-% IV SOLN
750.0000 mg | INTRAVENOUS | Status: DC
Start: 2016-06-06 — End: 2016-06-06
  Administered 2016-06-06: 750 mg via INTRAVENOUS
  Filled 2016-06-05: qty 150

## 2016-06-05 MED ORDER — DEXTROSE 5 % IV SOLN
1.0000 g | Freq: Once | INTRAVENOUS | Status: DC
  Filled 2016-06-05: qty 10

## 2016-06-05 MED ORDER — SODIUM CHLORIDE 0.9 % IV BOLUS (SEPSIS)
1000.0000 mL | Freq: Once | INTRAVENOUS | Status: AC
  Administered 2016-06-05: 1000 mL via INTRAVENOUS

## 2016-06-05 MED ORDER — IPRATROPIUM-ALBUTEROL 0.5-2.5 (3) MG/3ML IN SOLN
3.0000 mL | Freq: Four times a day (QID) | RESPIRATORY_TRACT | Status: DC
  Administered 2016-06-05 – 2016-06-07 (×9): 3 mL via RESPIRATORY_TRACT
  Filled 2016-06-05 (×9): qty 3

## 2016-06-05 MED ORDER — CEFTRIAXONE SODIUM 1 G IJ SOLR
1.0000 g | INTRAMUSCULAR | Status: DC
Start: 2016-06-05 — End: 2016-06-05

## 2016-06-05 MED ORDER — ACETAMINOPHEN 325 MG PO TABS: 650.0000 mg | ORAL_TABLET | Freq: Once | ORAL | Status: DC

## 2016-06-05 MED ORDER — PIPERACILLIN-TAZOBACTAM 3.375 G IVPB
3.3750 g | Freq: Three times a day (TID) | INTRAVENOUS | Status: DC
  Administered 2016-06-05 – 2016-06-06 (×3): 3.375 g via INTRAVENOUS
  Filled 2016-06-05 (×4): qty 50

## 2016-06-05 MED ORDER — POTASSIUM CHLORIDE CRYS ER 20 MEQ PO TBCR
40.0000 meq | EXTENDED_RELEASE_TABLET | Freq: Once | ORAL | Status: AC
  Administered 2016-06-05: 20 meq via ORAL
  Filled 2016-06-05: qty 2

## 2016-06-05 MED ORDER — PIPERACILLIN-TAZOBACTAM 3.375 G IVPB 30 MIN
3.3750 g | Freq: Once | INTRAVENOUS | Status: AC
  Administered 2016-06-05: 3.375 g via INTRAVENOUS
  Filled 2016-06-05: qty 50

## 2016-06-05 MED ORDER — ACETAMINOPHEN 650 MG RE SUPP
650.0000 mg | Freq: Once | RECTAL | Status: AC
  Administered 2016-06-05: 650 mg via RECTAL
  Filled 2016-06-05: qty 1

## 2016-06-05 MED ORDER — VANCOMYCIN HCL IN DEXTROSE 1-5 GM/200ML-% IV SOLN
1000.0000 mg | Freq: Once | INTRAVENOUS | Status: AC
  Administered 2016-06-05: 1000 mg via INTRAVENOUS
  Filled 2016-06-05: qty 200

## 2016-06-05 MED ORDER — PHENYLEPHRINE HCL 10 MG/ML IJ SOLN
0.0000 ug/min | Freq: Once | INTRAVENOUS | Status: DC
  Filled 2016-06-05: qty 1

## 2016-06-05 MED ORDER — MAGNESIUM SULFATE 4 GM/100ML IV SOLN
4.0000 g | Freq: Once | INTRAVENOUS | Status: AC
  Administered 2016-06-05: 4 g via INTRAVENOUS
  Filled 2016-06-05: qty 100

## 2016-06-05 MED ORDER — DEXTROSE 5 % IV SOLN
500.0000 mg | Freq: Once | INTRAVENOUS | Status: DC
  Filled 2016-06-05: qty 500

## 2016-06-05 MED ORDER — ASPIRIN 81 MG PO CHEW: 81.0000 mg | CHEWABLE_TABLET | ORAL | Status: AC

## 2016-06-05 MED ORDER — SODIUM CHLORIDE 0.9 % IV SOLN
INTRAVENOUS | Status: DC
  Administered 2016-06-05 (×2): via INTRAVENOUS
  Administered 2016-06-06: 100 mL/h via INTRAVENOUS

## 2016-06-05 MED ORDER — DEXTROSE 5 % IV SOLN: 500.0000 mg | INTRAVENOUS | Status: DC

## 2016-06-05 MED ORDER — PANTOPRAZOLE SODIUM 40 MG IV SOLR
40.0000 mg | Freq: Every day | INTRAVENOUS | Status: DC
  Administered 2016-06-05 – 2016-06-06 (×2): 40 mg via INTRAVENOUS
  Filled 2016-06-05 (×2): qty 40

## 2016-06-05 MED ORDER — ASPIRIN 300 MG RE SUPP
300.0000 mg | RECTAL | Status: AC
  Administered 2016-06-05: 300 mg via RECTAL
  Filled 2016-06-05: qty 1

## 2016-06-05 MED ORDER — PHENYLEPHRINE HCL 10 MG/ML IJ SOLN
0.0000 ug/min | INTRAMUSCULAR | Status: DC
  Administered 2016-06-05: 40 ug/min via INTRAVENOUS
  Administered 2016-06-05 – 2016-06-06 (×3): 20 ug/min via INTRAVENOUS
  Filled 2016-06-05 (×5): qty 1

## 2016-06-05 NOTE — Progress Notes (Signed)
  Called and updated daughter over phone  1. MSSA sepsis and septic shock 2. Respiratory failure - on BiPAP. Will need intubation if she gets worse 3. Anemic - explained  4. AKI 5. Lung cancer -> progressive since 05/16/16. Given above, no chemo tomorrow 6. Family meet: not sure what time daughter will be at ICU 06/06/16 . Advised her to call ICU and ask when family update can be do 7, Concern for ischemic bowel - low probe 8.  Goals -> Daughter says she and patient have discussed multiple tmes. Says Marissa [name] has high determination to live and values quantint and wants to give body "a chance to live" So daughter says patient is ok for around a week on ventilator. Daughter says ok for CPR " a few time":  Daughter Marissa [name] [phone] home number   Dr. Brand Males, M.D., [facility].C.P Pulmonary and Critical Care Medicine Staff Physician [facility] Pager: [phone], If no answer or between  15:00h - 7:00h: call [phone]  06/04/2016 6:42 PM

## 2016-06-05 NOTE — Assessment & Plan Note (Addendum)
Toxic / metabolic + possible component delirium  Plan Hopefully will improve as we treat underlying infxn, continue redirection Minimize mind altering meds.

## 2016-06-05 NOTE — ED Notes (Signed)
The pt returned from mri [id number] at the bedside

## 2016-06-05 NOTE — ED Notes (Signed)
The pt still asking for water  Still on bi-pap.  Rests for awhile then gets agitated.  Orders for a 1590mbolus

## 2016-06-05 NOTE — Assessment & Plan Note (Addendum)
Hold fosamax

## 2016-06-05 NOTE — Assessment & Plan Note (Addendum)
Hx CAD, troponin negative this admit  Plan Holding statin for now Continue ASA

## 2016-06-05 NOTE — ED Triage Notes (Signed)
Pt brought to ED by EMS for respiratory distress, on CPAP, per family pt had increase SOB since yesterday, on EMS arrival pt SPO2 84% on 4L, pt is a home O2 dependent, pt placed on CPAP with SPO2 in between  88-92%, very labored breathing even on CPAP, 5 Albuterol  And Atrovent given pta to ED by EMS with no relief. BP 119/62, HR 140, R 54. History of COPD, CHF and lung CA.

## 2016-06-05 NOTE — Progress Notes (Signed)
Pt arrived on cpap via EMS with labored breathing. RT placed pt on BiPAP upon pt arrival. Pt tolerating fairly well a this time. RT to continue to monitor as needed.

## 2016-06-05 NOTE — H&P (Signed)
PULMONARY / CRITICAL CARE MEDICINE   Name: Marissa [name] MRN: [id number] DOB: [birth year]    ADMISSION DATE:  05/26/2016 CONSULTATION DATE:  06/18/2016   REFERRING MD:  Dr Vilinda Blanks of ER  CHIEF COMPLAINT:  resp failure and septic shock in 83year old with lung cancer, full code  HISTORY OF PRESENT ILLNESS:   83 year old cachectic female with diast chf hx, a history of lung cancer followed by Dr. Julien Nordmann for stage IIIa T2, N2, M0 squamous cell carcinoma with recurrence in the right upper lobe as well as mediastinal adenopathy diagnosed in September 2016. She status post conventional platinum and Taxol therapy and also immunotherapy with Nivolumab status post 5 cycles ending December 2017 and discontinued due to disease progression. Last seen Dr. Julien Nordmann on 05/23/2016 and hospice care recommended but patient and daughter opted for single agent gemcitabine therapy to be started tomorrow  06/06/2016  She presented in 06/11/2016 to the emergency department with worsening dyspnea over the last 24 hours and worsening hypoxemia despite using her usual 2 L of oxygen and fever found to have 105 in the emergency room. In the emergency department ended up hypotensive requiring vasopressors for central line and needing BiPAP therapy for worsening respiratory distress. Chest x-ray showed increase right-sided lung cancer findings of opacitivcation and possible pelural effuson. Pulmonary critical care call for admission  Events 05/29/2016 -admit, biopap and Left IJ and neo   PAST MEDICAL HISTORY :  She  has a past medical history of A-fib ([place]); Aortic stenosis; Aortic valve disorders; Arthritis; CAD (coronary artery disease); CHF (congestive heart failure) ([place]); COPD (chronic obstructive pulmonary disease) ([place]); Dehydration (02/22/2016); Diabetes mellitus without complication ([place]); Dysrhythmia; Encounter for antineoplastic chemotherapy (05/23/2016); Encounter for antineoplastic immunotherapy (12/21/2015); Full  code status (12/22/2014); Full code status (12/22/2014); GERD (gastroesophageal reflux disease); Goals of care, counseling/discussion (05/23/2016); History of blood transfusion; HTN (hypertension); Hypercholesteremia; Hypothyroidism; Lung cancer ([place]); Mastoiditis; Mitral valve disease; Myocardial infarction; Osteopenia; Pneumonia; Pre-diabetes; Recurrent right pleural effusion (03/08/2016); RLS (restless legs syndrome); Shortness of breath; and Weight loss (02/22/2016).  PAST SURGICAL HISTORY: She  has a past surgical history that includes Mitral valve replacement; Laparoscopic cholecystectomy; Aortic valve replacement; Tonsillectomy; Coronary artery bypass graft (03/2011); Video bronchoscopy with endobronchial navigation (N/A, 01/06/2014); Video bronchoscopy with endobronchial ultrasound (N/A, 01/06/2014); Video bronchoscopy with endobronchial navigation (N/A, 11/21/2014); Video bronchoscopy with endobronchial ultrasound (N/A, 11/21/2014); and Compression hip screw (Left, 12/26/2014).  Allergies  Allergen Reactions  . Prozac [Fluoxetine Hcl] Rash    No current facility-administered medications on file prior to encounter.    Current Outpatient Prescriptions on File Prior to Encounter  Medication Sig  . acetaminophen (TYLENOL) 325 MG tablet Take 2 tablets (650 mg total) by mouth every 6 (six) hours as needed for mild pain (or Fever >/= 101).  [name] albuterol (PROVENTIL) (2.5 MG/3ML) 0.083% nebulizer solution Inhale 3 mLs into the lungs every 6 (six) hours as needed for wheezing or shortness of breath.   [name] alendronate (FOSAMAX) 70 MG tablet Take 70 mg by mouth once a week.   [name] aspirin EC 81 MG tablet Take 81 mg by mouth every morning.  [name] atorvastatin (LIPITOR) 40 MG tablet Take 40 mg by mouth daily. For HLD  . esomeprazole (NEXIUM) 40 MG capsule Take 40 mg by mouth daily as needed (for acid reflux).   . FeFum-FePoly-FA-B Cmp-C-Biot (INTEGRA PLUS) CAPS Take 1 capsule by mouth every morning.  .  fluticasone-salmeterol (ADVAIR HFA) 115-21 MCG/ACT inhaler Inhale 2 puffs into the lungs 2 (two)  times daily.   . furosemide (LASIX) 20 MG tablet Take 1 tablet (20 mg total) by mouth 2 (two) times daily.  . Ipratropium-Albuterol (COMBIVENT) 20-100 MCG/ACT AERS respimat Inhale 1 puff into the lungs every 6 (six) hours as needed for wheezing or shortness of breath.   [name] ipratropium-albuterol (DUONEB) 0.5-2.5 (3) MG/3ML SOLN Take 3 mLs by nebulization every 6 (six) hours as needed (for shortness of breath).   [name] levothyroxine (SYNTHROID, LEVOTHROID) 100 MCG tablet Take 100 mcg by mouth daily before breakfast.   . mirtazapine (REMERON) 30 MG tablet Take 1 tablet (30 mg total) by mouth at bedtime.  . potassium chloride SA (K-DUR,KLOR-CON) 20 MEQ tablet Take 1 tablet (20 mEq total) by mouth daily. (Patient taking differently: Take 20 mEq by mouth 2 (two) times daily. )  . rOPINIRole (REQUIP) 4 MG tablet Take 4 mg by mouth at bedtime.  . TOPROL XL 50 MG 24 hr tablet TAKE 1 TABLET EVERY EVENING (DISCONTINUE CARVEDIOLOL PRESCRIPTION)  . warfarin (COUMADIN) 1 MG tablet Take as directed by [facility] (Patient taking differently: Take 1-2 mg by mouth daily. Take '1mg'$  on Tuesday, Thursday and Saturday. All other days take '2mg'$ .)    FAMILY HISTORY:  Her is adopted.    SOCIAL HISTORY: She  reports that she has quit smoking. Her smoking use included Cigarettes. She has a 15.00 pack-year smoking history. She has never used smokeless tobacco. She reports that she does not drink alcohol or use drugs.   VITAL SIGNS: BP (!) 85/50   Pulse 94   Temp (!) 102.9 F (39.4 C)   Resp 14   Ht '5\' 3"'$  (1.6 m)   Wt 49.4 kg (109 lb)   SpO2 100%   BMI 19.31 kg/m   HEMODYNAMICS:    VENTILATOR SETTINGS: FiO2 (%):  [50 %-60 %] 50 %  INTAKE / OUTPUT: No intake/output data recorded.  EXAM  General Appearance:    Looks criticall ill. Very frail and cahchectic  Head:    Normocephalic, without obvious abnormality,  atraumatic  Eyes:    PERRL - yes, conjunctiva/corneas - clear      Ears:    Normal external ear canals, both ears  Nose:   NG tube - no  Throat:  ETT TUBE - no. BIIPAP on , OG tube - no  Neck:   Supple,  No enlargement/tenderness/nodules     Lungs:     RR 25-30, Mild acc mucle use. Some crackles. Not paradoxical  Chest wall:    No deformity  Heart:    S1 and S2 normal, no murmur, CVP - no.  Pressors - left IJ - pn neo 20  Abdomen:     Soft, no masses, no organomegaly  Genitalia:    Not done  Rectal:   not done  Extremities:   Extremities- cachectic, no edema     Skin:   Intact in exposed areas . Sacral area - unclear if decub     Neurologic:   Sedation - none -> RASS - +1 . Moves all 4s - yes. CAM-ICU - unable to test  . Orientation - unable to test due to bipap but was oriented       LABS  PULMONARY  Recent Labs Lab 05/25/2016 0444  PHART 7.386  PCO2ART 59.2*  PO2ART 137.0*  HCO3 34.3*  TCO2 36  O2SAT 98.0    CBC  Recent Labs Lab 06/06/2016 0255  HGB 8.6*  HCT 30.1*  WBC 26.5*  PLT 287  COAGULATION  Recent Labs Lab 05/25/2016 0255  INR 2.91    CARDIAC  No results for input(s): TROPONINI in the last 168 hours. No results for input(s): PROBNP in the last 168 hours.   CHEMISTRY  Recent Labs Lab 06/06/2016 0255  NA 134*  K 3.7  CL 91*  CO2 34*  GLUCOSE 98  BUN 14  CREATININE 0.99  CALCIUM 8.9   Estimated Creatinine Clearance: 34.8 mL/min (by C-G formula based on SCr of 0.99 mg/dL).   LIVER  Recent Labs Lab 05/25/2016 0255  AST 27  ALT 14  ALKPHOS 137*  BILITOT 1.0  PROT 6.7  ALBUMIN 2.9*  INR 2.91     INFECTIOUS  Recent Labs Lab 05/30/2016 0314 06/10/2016 0637  LATICACIDVEN 2.82* 1.50     ENDOCRINE CBG (last 3)  No results for input(s): GLUCAP in the last 72 hours.       IMAGING x48h  - image(s) personally visualized  -   highlighted in bold Dg Chest Port 1 View  Result Date: 05/29/2016 CLINICAL DATA:  83 year old  female with shortness of breath and respiratory distress. History of non-small-cell carcinoma of the right lung. EXAM: PORTABLE CHEST 1 VIEW COMPARISON:  Chest radiograph dated 03/08/2016 and CT dated 05/16/2016 FINDINGS: There is some the right pleural effusion. Right lung base opacity likely combination of pleural effusion and right lung mass and consolidative changes. There is an area of airspace opacity in the right apical region corresponding to loculated pleural effusion and consolidative changes of the lungs seen on the prior CT. There is a small aerated portion of the right lower lobe. Multiple surgical clips noted in the right hilar region. The left lung is clear. There is no pneumothorax. The right cardiac border is silhouetted by the right lung opacities. Median sternotomy wires and mechanical aortic valve as well as pulmonic valve noted. Osteopenia. No acute fracture. IMPRESSION: 1. Opacification of the right lung base and right apical region with only partially aerated right mid lung field. The right lung base opacities represent combination of pleural effusion, lung mass and consolidative change. Right apical opacity represents combination of loculated pleural effusion and consolidative changes of the lungs. Overall the appearance is similar to the CT of 05/16/2016. 2. Clear left lung. Electronically Signed   By: Anner Crete M.D.   On: 06/04/2016 03:33       ASSESSMENT and PLAN  Acute respiratory failure (HCC) Due to sepsis syndrome of Pneumonia (maybe post obstrucive ) +/- worsenign) right side lung cancer (? Effusion too)  PLAN BiPAP Intubate if worse CT chest and if needed tap effusion baedon CT findings  Shock circulatory ([place]) Due tos sepsis  Plan Hydrate for cVP > 10 and lactate clearance Neo for MAP > 65,. Consider levophed if needed  Severe sepsis ([place]) Severe sepsis based on fever. Sourcelikely lung - CAP v Post Obstructive v empyema  Plan Anti-infectives     Start     Dose/Rate Route Frequency Ordered Stop   06/06/16 0600  vancomycin (VANCOCIN) IVPB 750 mg/150 ml premix     750 mg 150 mL/hr over 60 Minutes Intravenous Every 24 hours 05/19/2016 0741     05/21/2016 2230  azithromycin (ZITHROMAX) 500 mg in dextrose 5 % 250 mL IVPB     500 mg 250 mL/hr over 60 Minutes Intravenous Every 24 hours 06/13/2016 0344     06/15/2016 2200  cefTRIAXone (ROCEPHIN) 1 g in dextrose 5 % 50 mL IVPB     1 g  100 mL/hr over 30 Minutes Intravenous Every 24 hours 06/03/2016 0344     06/01/2016 0730  piperacillin-tazobactam (ZOSYN) IVPB 3.375 g     3.375 g 100 mL/hr over 30 Minutes Intravenous  Once 06/06/2016 0722 05/21/2016 0854   05/27/2016 0730  vancomycin (VANCOCIN) IVPB 1000 mg/200 mL premix     1,000 mg 200 mL/hr over 60 Minutes Intravenous  Once 06/13/2016 0728     06/09/2016 0300  cefTRIAXone (ROCEPHIN) 1 g in dextrose 5 % 50 mL IVPB     1 g 100 mL/hr over 30 Minutes Intravenous  Once 06/01/2016 0256     06/09/2016 0300  azithromycin (ZITHROMAX) 500 mg in dextrose 5 % 250 mL IVPB     500 mg 250 mL/hr over 60 Minutes Intravenous  Once 05/29/2016 0256         Acute encephalopathy Possibly developing delirium due to sepsis  Plan monitor  Electrolyte imbalance mikld low K  Plan replete  Cancer of lower lobe of right lung ([place]) She was recommended hospice 05/23/16 for stage3A NSCLC that progressed through 2017 and early 2018 despite chemo and immune rx. But she and daughter wanted more chemo and are scheduled for gemcitabline 06/06/16  Plan No chemo right now  Anemia, chronic disease Appears to be stable > 8gm% from baseline  - PRBC for hgb </= 6.9gm%    - exceptions are   -  if ACS susepcted/confirmed then transfuse for hgb </= 8.0gm%,  or    -  active bleeding with hemodynamic instability, then transfuse regardless of hemoglobin value   At at all times try to transfuse 1 unit prbc as possible with exception of active hemorrhage    Full code status Per ER RN and  priopr oncology notes - daughter insistent on full code though she is hospice eligible  Plan Need to readdress code status; this acute illness might be a termninal event  Type 2 diabetes mellitus ([place]) ssi  CAD (coronary artery disease) Has hx of CAD  Plan Serial trop Hold statin contiu asa  A-fib ([place]) Hx of afib. Details not known but MAR shows she is on coumading   Recent Labs Lab 06/01/2016 0255  INR 2.91    Plan monitopr IV heparin per pharmacy when INR < 1.6  Hypothyroidism recheckl TSH and consider synthrouid  Aortic valve replaced This is mentioned in chart  Plan Get echo  COPD (chronic obstructive pulmonary disease) ([place]) Copd based on chart hx  Plan bd  Goals of care, counseling/discussion Full code as stated to ER by daughter 06/17/2016 and on 05/23/16 to oncology  Need full goals of care again  Osteopenia Hold fosfamaz  RLS (restless legs syndrome) Hold ropinrole  Hypertensive heart disease with CHF (congestive heart failure) ([place]) Hx of diast cf with a fib and cad  Plan Monitor for vol overload       FAMILY  - Updates: 06/01/2016 --> none at bedside in ER  - Inter-disciplinary family meet or Palliative Care meeting due by:  DAy 7. Current LOS is LOS 0 days  CODE STATUS Code Status History    Date Active Date Inactive Code Status Order ID Comments User Context   12/08/2015  3:29 AM 12/16/2015  2:12 PM Full Code [id number]  Rise Patience, MD ED   12/26/2014  1:18 PM 12/29/2014  6:48 PM Full Code [id number]  Leighton Parody, PA-C Inpatient   12/26/2014  2:18 AM 12/26/2014  1:18 PM Full Code [id number]  Reubin Milan,  MD Inpatient        DISPO Transfer to ICU from ER      The patient is critically ill with multiple organ systems failure and requires high complexity decision making for assessment and support, frequent evaluation and titration of therapies, application of advanced monitoring technologies and extensive  interpretation of multiple databases.   Critical Care Time devoted to patient care services described in this note is  45  Minutes. This time reflects time of care of this signee Dr Brand Males. This critical care time does not reflect procedure time, or teaching time or supervisory time of PA/NP/Med student/Med Resident etc but could involve care discussion time    Dr. Brand Males, M.D., [facility].C.P Pulmonary and Critical Care Medicine Staff Physician [facility] Pager: [phone], If no answer or between  15:00h - 7:00h: call [phone]  06/08/2016 8:43 AM

## 2016-06-05 NOTE — ED Provider Notes (Signed)
[facility] DEPT Provider Note   CSN: [id number] Arrival date & time: 05/19/2016  0243  By signing my name below, I, [name], attest that this documentation has been prepared under the direction and in the presence of [name], MD. Electronically Signed: [name], Scribe. 05/27/2016. 8:14 AM.   History   Chief Complaint Chief Complaint  Patient presents with  . Shortness of Breath    CPAP    HPI Marissa [name] is a 83 y.o. female with history of CAD requiring CABG, CHF, HTN, lung cancer, A-fib, and COPD who presents to the ED in respirating distress. According to medic, this patient has experienced gradually worsening dyspnea over the last 24 hours. Tonight, they found the patient hypoxic on her usual 2L home oxygen requirement. At interview, the patient is reporting chills; febrile to 105.76F on rectal temp. Denies chest pain.  The history is provided by the patient. No language interpreter was used.  Shortness of Breath  This is a chronic problem. The problem occurs continuously.The current episode started yesterday. The problem has been gradually worsening. Associated symptoms include a fever. Pertinent negatives include no chest pain. Treatments tried: home oxygen. She has had prior hospitalizations. She has had prior ED visits. Associated medical issues include COPD, chronic lung disease and CAD.    Past Medical History:  Diagnosis Date  . A-fib ([place])   . Aortic stenosis    (s/p AVR w/ 21 mm porcine valve 03/2011  . Aortic valve disorders   . Arthritis   . CAD (coronary artery disease)    s/p CABG 03/2011  . CHF (congestive heart failure) ([place])   . COPD (chronic obstructive pulmonary disease) ([place])   . Dehydration 02/22/2016  . Diabetes mellitus without complication ([place])   . Dysrhythmia   . Encounter for antineoplastic chemotherapy 05/23/2016  . Encounter for antineoplastic immunotherapy 12/21/2015  . Full code status 12/22/2014  . Full code  status 12/22/2014  . GERD (gastroesophageal reflux disease)    takes nexium prn  . Goals of care, counseling/discussion 05/23/2016  . History of blood transfusion    Rheumatic Fever  . HTN (hypertension)   . Hypercholesteremia   . Hypothyroidism   . Lung cancer ([place])   . Mastoiditis   . Mitral valve disease    s/p MVR 03/2011 w/ 27 mm porcine valve  . Myocardial infarction    "slight"  . Osteopenia   . Pneumonia   . Pre-diabetes   . Recurrent right pleural effusion 03/08/2016  . RLS (restless legs syndrome)   . Shortness of breath    with exertion  . Weight loss 02/22/2016    Patient Active Problem List   Diagnosis Date Noted  . Encounter for antineoplastic chemotherapy 05/23/2016  . Goals of care, counseling/discussion 05/23/2016  . Recurrent right pleural effusion 03/08/2016  . Dehydration 02/22/2016  . Weight loss 02/22/2016  . Encounter for antineoplastic immunotherapy 12/21/2015  . Encounter for therapeutic drug monitoring 12/18/2015  . Right ankle pain 12/15/2015  . Chronic diastolic congestive heart failure ([place])   . Epistaxis   . Left atrial thrombus 12/08/2015  . Cancer of lower lobe of right lung ([place]) 08/03/2015  . Aortic insufficiency 04/06/2015  . Aortic valve replaced 04/06/2015  . Mitral valve replaced 04/06/2015  . Postoperative anemia due to acute blood loss 01/04/2015  . Type 2 diabetes mellitus ([place]) 12/26/2014  . Closed left hip fracture ([place]) 12/25/2014  . Full code status 12/22/2014  . Full code status 12/22/2014  .  Primary cancer of right upper lobe of lung ([place]) 12/19/2013  . Hypertensive heart disease with CHF (congestive heart failure) ([place])   . Hypercholesteremia   . CAD (coronary artery disease)   . Aortic valve disorder   . RLS (restless legs syndrome)   . A-fib ([place])   . Osteopenia   . Mitral valve disease   . Hypothyroidism   . COPD (chronic obstructive pulmonary disease) ([place])   . CHF (congestive heart failure) ([place])   . Aortic  stenosis   . Mastoiditis     Past Surgical History:  Procedure Laterality Date  . AORTIC VALVE REPLACEMENT    . COMPRESSION HIP SCREW Left 12/26/2014   Procedure: ORIF LEFT HIP INTERTROCHANTRIC FRACTURE;  Surgeon: Frederik Pear, MD;  Location: [facility];  Service: Orthopedics;  Laterality: Left;  . CORONARY ARTERY BYPASS GRAFT  03/2011   1990's also  . LAPAROSCOPIC CHOLECYSTECTOMY    . MITRAL VALVE REPLACEMENT     1990's  . TONSILLECTOMY    . VIDEO BRONCHOSCOPY WITH ENDOBRONCHIAL NAVIGATION N/A 01/06/2014   Procedure: VIDEO BRONCHOSCOPY WITH ENDOBRONCHIAL NAVIGATION;  Surgeon: [name], MD;  Location: [facility];  Service: Thoracic;  Laterality: N/A;  . VIDEO BRONCHOSCOPY WITH ENDOBRONCHIAL NAVIGATION N/A 11/21/2014   Procedure: VIDEO BRONCHOSCOPY WITH ENDOBRONCHIAL NAVIGATION;  Surgeon: [name], MD;  Location: [facility];  Service: Thoracic;  Laterality: N/A;  . VIDEO BRONCHOSCOPY WITH ENDOBRONCHIAL ULTRASOUND N/A 01/06/2014   Procedure: VIDEO BRONCHOSCOPY WITH ENDOBRONCHIAL ULTRASOUND;  Surgeon: [name], MD;  Location: [facility];  Service: Thoracic;  Laterality: N/A;  . VIDEO BRONCHOSCOPY WITH ENDOBRONCHIAL ULTRASOUND N/A 11/21/2014   Procedure: VIDEO BRONCHOSCOPY WITH ENDOBRONCHIAL ULTRASOUND;  Surgeon: [name], MD;  Location: [facility];  Service: Thoracic;  Laterality: N/A;    OB History    No data available       Home Medications    Prior to Admission medications   Medication Sig Start Date End Date Taking? Authorizing Provider  acetaminophen (TYLENOL) 325 MG tablet Take 2 tablets (650 mg total) by mouth every 6 (six) hours as needed for mild pain (or Fever >/= 101). 12/29/14  Yes Delfina Redwood, MD  albuterol (PROVENTIL) (2.5 MG/3ML) 0.083% nebulizer solution Inhale 3 mLs into the lungs every 6 (six) hours as needed for wheezing or shortness of breath.  12/25/15  Yes Historical Provider, MD  alendronate (FOSAMAX) 70 MG tablet Take 70 mg by mouth once  a week.  09/16/14  Yes Historical Provider, MD  aspirin EC 81 MG tablet Take 81 mg by mouth every morning.   Yes Historical Provider, MD  atorvastatin (LIPITOR) 40 MG tablet Take 40 mg by mouth daily. For HLD   Yes Historical Provider, MD  esomeprazole (NEXIUM) 40 MG capsule Take 40 mg by mouth daily as needed (for acid reflux).    Yes Historical Provider, MD  FeFum-FePoly-FA-B Cmp-C-Biot (INTEGRA PLUS) CAPS Take 1 capsule by mouth every morning. 05/23/16  Yes Curt Bears, MD  fluticasone-salmeterol (ADVAIR HFA) [phone] MCG/ACT inhaler Inhale 2 puffs into the lungs 2 (two) times daily.    Yes Historical Provider, MD  furosemide (LASIX) 20 MG tablet Take 1 tablet (20 mg total) by mouth 2 (two) times daily. 12/16/15  Yes Mauricio Gerome Apley, MD  Ipratropium-Albuterol (COMBIVENT) 20-100 MCG/ACT AERS respimat Inhale 1 puff into the lungs every 6 (six) hours as needed for wheezing or shortness of breath.    Yes Historical Provider, MD  ipratropium-albuterol (DUONEB) 0.5-2.5 (3) MG/3ML SOLN  Take 3 mLs by nebulization every 6 (six) hours as needed (for shortness of breath).    Yes Historical Provider, MD  levothyroxine (SYNTHROID, LEVOTHROID) 100 MCG tablet Take 100 mcg by mouth daily before breakfast.    Yes Historical Provider, MD  mirtazapine (REMERON) 30 MG tablet Take 1 tablet (30 mg total) by mouth at bedtime. 05/23/16  Yes Curt Bears, MD  potassium chloride SA (K-DUR,KLOR-CON) 20 MEQ tablet Take 1 tablet (20 mEq total) by mouth daily. Patient taking differently: Take 20 mEq by mouth 2 (two) times daily.  08/25/15  Yes Curt Bears, MD  rOPINIRole (REQUIP) 4 MG tablet Take 4 mg by mouth at bedtime.   Yes Historical Provider, MD  TOPROL XL 50 MG 24 hr tablet TAKE 1 TABLET EVERY EVENING (DISCONTINUE CARVEDIOLOL PRESCRIPTION) 11/26/15  Yes Jerline Pain, MD  warfarin (COUMADIN) 1 MG tablet Take as directed by [facility] Patient taking differently: Take 1-2 mg by mouth daily. Take '1mg'$  on Tuesday,  Thursday and Saturday. All other days take '2mg'$ . 03/22/16  Yes Jerline Pain, MD    Family History Family History  Problem Relation Age of Onset  . Adopted: Yes  . Cancer Brother     lung  . Heart attack Neg Hx   . Stroke Neg Hx     Social History Social History  Substance Use Topics  . Smoking status: Former Smoker    Packs/day: 0.50    Years: 30.00    Types: Cigarettes  . Smokeless tobacco: Never Used  . Alcohol use No     Allergies   Prozac [fluoxetine hcl]   Review of Systems Review of Systems  Constitutional: Positive for fever.  Respiratory: Positive for shortness of breath.   Cardiovascular: Negative for chest pain.  All other systems reviewed and are negative.    Physical Exam Updated Vital Signs BP (!) 94/59   Pulse (!) 118   Temp (!) 102.9 F (39.4 C)   Resp (!) 45   Ht '5\' 3"'$  (1.6 m)   Wt 109 lb (49.4 kg)   SpO2 100%   BMI 19.31 kg/m   Physical Exam  Constitutional: She is oriented to person, place, and time. She appears well-developed and well-nourished. No distress.  HENT:  Head: Normocephalic and atraumatic.  Right Ear: Hearing normal.  Left Ear: Hearing normal.  Nose: Nose normal.  Mouth/Throat: Oropharynx is clear and moist and mucous membranes are normal.  Eyes: Conjunctivae and EOM are normal. Pupils are equal, round, and reactive to light.  Neck: Normal range of motion. Neck supple.  Cardiovascular: S1 normal and S2 normal.  An irregular rhythm present. Tachycardia present.  Exam reveals no gallop and no friction rub.   No murmur heard. Pulmonary/Chest:  Patient in respiratory distress with accessory muscle use. Diminished breath sounds throughout with minimal air movement. Slight wheezing. No rhonchi. No crackles.  Abdominal: Soft. Normal appearance and bowel sounds are normal. There is no hepatosplenomegaly. There is no tenderness. There is no rebound, no guarding, no tenderness at McBurney's point and negative Murphy's sign. No  hernia.  Musculoskeletal: Normal range of motion.  3+ bilateral lower extremity edema.  Neurological: She is alert and oriented to person, place, and time. She has normal strength. No cranial nerve deficit or sensory deficit. Coordination normal. GCS eye subscore is 4. GCS verbal subscore is 5. GCS motor subscore is 6.  Skin: Skin is warm, dry and intact. No rash noted. No cyanosis.  Psychiatric: She has a normal  mood and affect. Her speech is normal and behavior is normal. Thought content normal.  Nursing note and vitals reviewed.    ED Treatments / Results  Labs (all labs ordered are listed, but only abnormal results are displayed) Labs Reviewed  COMPREHENSIVE METABOLIC PANEL - Abnormal; Notable for the following:       Result Value   Sodium 134 (*)    Chloride 91 (*)    CO2 34 (*)    Albumin 2.9 (*)    Alkaline Phosphatase 137 (*)    GFR calc non Af Amer 52 (*)    All other components within normal limits  CBC WITH DIFFERENTIAL/PLATELET - Abnormal; Notable for the following:    WBC 26.5 (*)    Hemoglobin 8.6 (*)    HCT 30.1 (*)    MCV 71.3 (*)    MCH 20.4 (*)    MCHC 28.6 (*)    RDW 17.5 (*)    Neutro Abs 25.1 (*)    Lymphs Abs 0.3 (*)    Monocytes Absolute 1.1 (*)    All other components within normal limits  PROTIME-INR - Abnormal; Notable for the following:    Prothrombin Time 31.0 (*)    All other components within normal limits  I-STAT CG4 LACTIC ACID, ED - Abnormal; Notable for the following:    Lactic Acid, Venous 2.82 (*)    All other components within normal limits  I-STAT ARTERIAL BLOOD GAS, ED - Abnormal; Notable for the following:    pCO2 arterial 59.2 (*)    pO2, Arterial 137.0 (*)    Bicarbonate 34.3 (*)    Acid-Base Excess 9.0 (*)    All other components within normal limits  CULTURE, BLOOD (ROUTINE X 2)  CULTURE, BLOOD (ROUTINE X 2)  URINALYSIS, ROUTINE W REFLEX MICROSCOPIC  INFLUENZA PANEL BY PCR (TYPE A & B)  I-STAT CG4 LACTIC ACID, ED     EKG  EKG Interpretation None       Radiology Dg Chest Port 1 View  Result Date: 06/04/2016 CLINICAL DATA:  83 year old female with shortness of breath and respiratory distress. History of non-small-cell carcinoma of the right lung. EXAM: PORTABLE CHEST 1 VIEW COMPARISON:  Chest radiograph dated 03/08/2016 and CT dated 05/16/2016 FINDINGS: There is some the right pleural effusion. Right lung base opacity likely combination of pleural effusion and right lung mass and consolidative changes. There is an area of airspace opacity in the right apical region corresponding to loculated pleural effusion and consolidative changes of the lungs seen on the prior CT. There is a small aerated portion of the right lower lobe. Multiple surgical clips noted in the right hilar region. The left lung is clear. There is no pneumothorax. The right cardiac border is silhouetted by the right lung opacities. Median sternotomy wires and mechanical aortic valve as well as pulmonic valve noted. Osteopenia. No acute fracture. IMPRESSION: 1. Opacification of the right lung base and right apical region with only partially aerated right mid lung field. The right lung base opacities represent combination of pleural effusion, lung mass and consolidative change. Right apical opacity represents combination of loculated pleural effusion and consolidative changes of the lungs. Overall the appearance is similar to the CT of 05/16/2016. 2. Clear left lung. Electronically Signed   By: Anner Crete M.D.   On: 05/27/2016 03:33    Procedures .Central Line Date/Time: 06/09/2016 8:11 AM Performed by: [name] Authorized by: [name]   Consent:    Consent obtained:  Verbal   Consent given by:  Patient and healthcare agent   Risks discussed:  Arterial puncture, incorrect placement, bleeding and infection Universal protocol:    Procedure explained and questions answered to patient or proxy's satisfaction:  yes     Relevant documents present and verified: yes     Test results available and properly labeled: yes     Imaging studies available: yes     Required blood products, implants, devices, and special equipment available: yes     Site/side marked: yes     Immediately prior to procedure, a time out was called: yes     Patient identity confirmed:  Verbally with patient and arm band Pre-procedure details:    Hand hygiene: Hand hygiene performed prior to insertion     Sterile barrier technique: All elements of maximal sterile technique followed     Skin preparation:  2% chlorhexidine   Skin preparation agent: Skin preparation agent completely dried prior to procedure   Anesthesia (see MAR for exact dosages):    Anesthesia method:  Local infiltration   Local anesthetic:  Lidocaine 1% w/o epi Procedure details:    Location:  R femoral   Site selection rationale:  INR 2.91, compressible vein   Patient position:  Flat   Procedural supplies:  Triple lumen   Catheter size:  7 Fr   Landmarks identified: yes     Ultrasound guidance: yes     Sterile ultrasound techniques: Sterile gel and sterile probe covers were used     Number of attempts:  1   Successful placement: yes   Post-procedure details:    Post-procedure:  Dressing applied   Assessment:  Blood return through all ports and free fluid flow   Patient tolerance of procedure:  Tolerated well, no immediate complications   (including critical care time)  Medications Ordered in ED Medications  cefTRIAXone (ROCEPHIN) 1 g in dextrose 5 % 50 mL IVPB (0 g Intravenous Stopped 06/04/2016 0502)  azithromycin (ZITHROMAX) 500 mg in dextrose 5 % 250 mL IVPB (0 mg Intravenous Stopped 05/26/2016 0502)  cefTRIAXone (ROCEPHIN) 1 g in dextrose 5 % 50 mL IVPB (not administered)  azithromycin (ZITHROMAX) 500 mg in dextrose 5 % 250 mL IVPB (not administered)  piperacillin-tazobactam (ZOSYN) IVPB 3.375 g (not administered)  phenylephrine (NEO-SYNEPHRINE) 10 mg  in sodium chloride 0.9 % 250 mL (0.04 mg/mL) infusion (not administered)  vancomycin (VANCOCIN) IVPB 1000 mg/200 mL premix (not administered)  vancomycin (VANCOCIN) IVPB 750 mg/150 ml premix (not administered)  acetaminophen (TYLENOL) suppository 650 mg (650 mg Rectal Given 05/31/2016 0312)  sodium chloride 0.9 % bolus 1,000 mL (0 mLs Intravenous Stopped 05/26/2016 0714)    And  sodium chloride 0.9 % bolus 500 mL (0 mLs Intravenous Stopped 06/06/2016 0714)     Initial Impression / Assessment and Plan / ED Course  I have reviewed the triage vital signs and the nursing notes.  Pertinent labs & imaging results that were available during my care of the patient were reviewed by me and considered in my medical decision making (see chart for details).     Patient presents to the ER with difficulty breathing. Patient has a history of right-sided lung cancer. She has had multiple recurrences and is to initiate repeat chemotherapy this coming week. Patient has become severely short of breath overnight. At arrival to the ER she was in distress. EMS reports severe distress and hypoxia initially. Improved with CPAP.  Patient continued on BiPAP at arrival here in the  ER. She was febrile with a rectal temp of 105.2. She was tachycardic but not hypotensive. She was initiated on community-acquired pneumonia antibiotics at arrival. Lactate was 2.8, not a candidate for empiric high-dose fluids at arrival. She was continued on monitoring, however, and became hypotensive. At that time her map dropped below 65, she was initiated on 30 mL/kg IV fluids. Chest x-ray shows mass and pneumonia. Looking at the x-ray, I became concerned about the possibility of postobstructive pneumonia. She was therefore ordered Zosyn and vancomycin for more broad-spectrum coverage. Patient has been maintained on BiPAP and appears to be more comfortable. Blood gas does not show significant hypoxia or CO2 retention.  After fluid bolus, patient still  hypotensive. Discussed with critical care. Will initiate Neo-Synephrine and will be evaluated by critical care for admission. Central line placed at request of critical care. Patient is anticoagulated, right groin was therefore chosen for increased compressibility.  CRITICAL CARE Performed by: [name]   Total critical care time: 35 minutes  Critical care time was exclusive of separately billable procedures and treating other patients.  Critical care was necessary to treat or prevent imminent or life-threatening deterioration.  Critical care was time spent personally by me on the following activities: development of treatment plan with patient and/or surrogate as well as nursing, discussions with consultants, evaluation of patient's response to treatment, examination of patient, obtaining history from patient or surrogate, ordering and performing treatments and interventions, ordering and review of laboratory studies, ordering and review of radiographic studies, pulse oximetry and re-evaluation of patient's condition.   Final Clinical Impressions(s) / ED Diagnoses   Final diagnoses:  Postobstructive pneumonia  Malignant neoplasm of right lung, unspecified part of lung ([name])  Sepsis, due to unspecified organism [facility])    New Prescriptions New Prescriptions   No medications on file  I personally performed the services described in this documentation, which was scribed in my presence. The recorded information has been reviewed and is accurate.    [name], MD 05/25/2016 [phone]

## 2016-06-05 NOTE — Assessment & Plan Note (Addendum)
Hx COPD  Plan BD as ordered

## 2016-06-05 NOTE — Assessment & Plan Note (Addendum)
Hx porcine AVR and MVR  Plan:  TTE ordered, as above at risk endocarditis

## 2016-06-05 NOTE — Assessment & Plan Note (Addendum)
Holding ropinrole for now

## 2016-06-05 NOTE — Assessment & Plan Note (Addendum)
CBG's and SSI as ordered

## 2016-06-05 NOTE — Assessment & Plan Note (Addendum)
A Fib, rate controlled  Recent Labs Lab 06/17/2016 0255 05/27/2016 0923 06/06/16 0400  INR 2.91 2.81 3.49   Plan  coumadin on hold, INR rising Heparin when INR < 2.0 Follow INR, consider Vit k if still rising on 3/20

## 2016-06-05 NOTE — Progress Notes (Signed)
Pharmacy Antibiotic Note  Marissa [name] is a 83 y.o. female admitted on 05/27/2016 with pneumonia.  Pharmacy has been consulted for vancomycin and zosyn dosing. Tmax is 105.2 and WBC is elevated at 26.5. Lactic acid improved from 2.82 to 1.5. SCr is WNL.   Plan: Continue vancomycin '750mg'$  IV Q24H as previously ordered Zosyn 3.375gm IV Q8H (4 hr inf) F/u renal fxn, C&S, clinical status and trough at SS  Height: '5\' 3"'$  (160 cm) Weight: 109 lb (49.4 kg) IBW/kg (Calculated) : 52.4  Temp (24hrs), Avg:104.1 F (40.1 C), Min:102.9 F (39.4 C), Max:105.2 F (40.7 C)   Recent Labs Lab 05/22/2016 0255 05/25/2016 0314 06/04/2016 0637  WBC 26.5*  --   --   CREATININE 0.99  --   --   LATICACIDVEN  --  2.82* 1.50    Estimated Creatinine Clearance: 34.8 mL/min (by C-G formula based on SCr of 0.99 mg/dL).    Allergies  Allergen Reactions  . Prozac [Fluoxetine Hcl] Rash    Antimicrobials this admission: Vanc 3/18>> Zosyn 3/18>>  Dose adjustments this admission: N/A  Microbiology results: Pending  Thank you for allowing pharmacy to be a part of this patient's care.  Sharline Lehane, Rande Lawman 05/30/2016 9:57 AM

## 2016-06-05 NOTE — Progress Notes (Signed)
  PHARMACY - PHYSICIAN COMMUNICATION CRITICAL VALUE ALERT - BLOOD CULTURE IDENTIFICATION (BCID)  Results for orders placed or performed during the hospital encounter of 06/06/2016  Blood Culture ID Panel (Reflexed) (Collected: 06/18/2016  3:06 AM)  Result Value Ref Range   Enterococcus species NOT DETECTED NOT DETECTED   Listeria monocytogenes NOT DETECTED NOT DETECTED   Staphylococcus species DETECTED (A) NOT DETECTED   Staphylococcus aureus DETECTED (A) NOT DETECTED   Methicillin resistance NOT DETECTED NOT DETECTED   Streptococcus species NOT DETECTED NOT DETECTED   Streptococcus agalactiae NOT DETECTED NOT DETECTED   Streptococcus pneumoniae NOT DETECTED NOT DETECTED   Streptococcus pyogenes NOT DETECTED NOT DETECTED   Acinetobacter baumannii NOT DETECTED NOT DETECTED   Enterobacteriaceae species NOT DETECTED NOT DETECTED   Enterobacter cloacae complex NOT DETECTED NOT DETECTED   Escherichia coli NOT DETECTED NOT DETECTED   Klebsiella oxytoca NOT DETECTED NOT DETECTED   Klebsiella pneumoniae NOT DETECTED NOT DETECTED   Proteus species NOT DETECTED NOT DETECTED   Serratia marcescens NOT DETECTED NOT DETECTED   Haemophilus influenzae NOT DETECTED NOT DETECTED   Neisseria meningitidis NOT DETECTED NOT DETECTED   Pseudomonas aeruginosa NOT DETECTED NOT DETECTED   Candida albicans NOT DETECTED NOT DETECTED   Candida glabrata NOT DETECTED NOT DETECTED   Candida krusei NOT DETECTED NOT DETECTED   Candida parapsilosis NOT DETECTED NOT DETECTED   Candida tropicalis NOT DETECTED NOT DETECTED   Name of physician (or Provider) Contacted: Dr. Chase Caller  Changes to prescribed antibiotics required: None - also concerned for PNA so continuing Vancomycin + Zosyn for now. CCM to re-access tomorrow. ID will also receive automatic consult.   Lawson Radar 06/09/2016  5:02 PM

## 2016-06-05 NOTE — Assessment & Plan Note (Addendum)
Following H[facility], stabile at this time.   Plan continue to follow intermittent CBC

## 2016-06-05 NOTE — Progress Notes (Signed)
Pharmacy Antibiotic Note  MICKIE BADDERS is a 83 y.o. female admitted on 06/01/2016 with pneumonia.  Pharmacy has been consulted for Ceftriaxone/Azithromycin dosing. WBC is elevated. Lactic acid elevated. Pt has active lung CA.   Plan: -Ceftriaxone 1g IV q24h -Azithromycin 500 mg IV q24h -Trend WBC, temp -F/U infectious work-up  Height: '5\' 3"'$  (160 cm) Weight: 109 lb (49.4 kg) IBW/kg (Calculated) : 52.4  Temp (24hrs), Avg:105.2 F (40.7 C), Min:105.2 F (40.7 C), Max:105.2 F (40.7 C)   Recent Labs Lab 06/10/2016 0255 05/21/2016 0314  WBC 26.5*  --   CREATININE 0.99  --   LATICACIDVEN  --  2.82*    Estimated Creatinine Clearance: 34.8 mL/min (by C-G formula based on SCr of 0.99 mg/dL).    Allergies  Allergen Reactions  . Prozac [Fluoxetine Hcl] Rash     Narda Bonds 06/17/2016 3:40 AM

## 2016-06-05 NOTE — Assessment & Plan Note (Addendum)
Note discussions between Dr Julien Nordmann and Marissa [name], and since admission for this acute illness. Marissa [name] desires aggressive care, would want intubation, even brief CPR to prolong life  Plan Will continue to address code status and GOC with Marissa [name] and daughter.

## 2016-06-05 NOTE — Assessment & Plan Note (Addendum)
Due to sepsis, resolved.   Plan Follow volume status, off pressors.  Abx as below

## 2016-06-05 NOTE — ED Notes (Signed)
Critical care at bedside  

## 2016-06-05 NOTE — Assessment & Plan Note (Addendum)
Per Dr Golden Pop discussions: Full code as stated to ER by daughter 06/12/2016 and on 05/23/16 to oncology  Plan:  Will need to continue to discuss [place] as we go forward, based on recovery or lack of improvement

## 2016-06-05 NOTE — Assessment & Plan Note (Addendum)
Hypokalemia  Plan Replete 3/19 am

## 2016-06-05 NOTE — Progress Notes (Addendum)
Ct Chest Wo Contrast  Result Date: 06/04/2016 CLINICAL DATA:  Respiratory distress and fever. History of lung cancer. EXAM: CT CHEST WITHOUT CONTRAST TECHNIQUE: Multidetector CT imaging of the chest was performed following the standard protocol without IV contrast. COMPARISON:  Portable chest obtained earlier today. Chest CT dated 05/16/2016. FINDINGS: Cardiovascular: Dense coronary artery and aortic calcifications. Mildly enlarged heart with stable marked left atrial enlargement. Mediastinum/Nodes: The previously demonstrated 2.7 cm short axis right supraclavicular lymph node has a short axis diameter of 3.5 cm today on image number 19 of series 201. The previously demonstrated 1.4 cm short axis subcarinal node is not as well delineated today without intravenous contrast, with an approximate short axis diameter of 1.4 cm on image number 45 of series 201. Lungs/Pleura: Left lower lobe atelectasis with little change. Stable right upper hemithorax postradiation changes and consolidation. Stable moderate size right pleural effusion with loculations. The previously demonstrated 5.6 x 4.8 cm necrotic mass along the anterior aspect of the right middle lobe is not as well delineated without intravenous contrast today. This mass measures approximately 6.9 x 5.5 cm on image number 48 of series 201 with mildly progressive extension through the chest wall anteriorly. Upper Abdomen: Interval linear branching air in the periphery of the liver anteriorly. Cholecystectomy clips. Interval small amount of free peritoneal fluid adjacent to the spleen. There is probable partially imaged hepatic flexure in the region of the gallbladder fossa. Musculoskeletal: Progressive destruction of the anterolateral aspect of the right third rib, encased by the tumor extending through the chest wall. Mildly displaced right lateral third rib fracture with bridging callus and well visualized fracture line. IMPRESSION: 1. Interval linear branching  area in the periphery of the liver anteriorly. This is concerning for portal venous gas. This can be seen with intestinal necrosis. If the patient has corresponding abdominal symptoms, an abdomen and pelvis CT with contrast would be recommended. 2. Increased size of the known right middle lobe malignancy with chest wall invasion. This currently measures 6.9 x 5.5 cm in the axial plane. 3. Progressive destruction of the involved right third rib as well as a subacute right third rib fracture. 4. Progressive right supraclavicular metastatic adenopathy with stable mild subcarinal metastatic adenopathy. 5. Stable moderate-sized right pleural effusion with loculation. 6. Stable left lower lobe atelectasis. 7. Extensive coronary artery atherosclerosis and aortic atherosclerosis. Critical Value/emergent results were called by telephone at the time of interpretation on 06/04/2016 at 11:06 am to Dr. Brand Males , who verbally acknowledged these results. Electronically Signed   By: Claudie Revering M.D.   On: 05/19/2016 11:11   Dg Chest Port 1 View  Result Date: 06/03/2016 CLINICAL DATA:  83 year old female with shortness of breath and respiratory distress. History of non-small-cell carcinoma of the right lung. EXAM: PORTABLE CHEST 1 VIEW COMPARISON:  Chest radiograph dated 03/08/2016 and CT dated 05/16/2016 FINDINGS: There is some the right pleural effusion. Right lung base opacity likely combination of pleural effusion and right lung mass and consolidative changes. There is an area of airspace opacity in the right apical region corresponding to loculated pleural effusion and consolidative changes of the lungs seen on the prior CT. There is a small aerated portion of the right lower lobe. Multiple surgical clips noted in the right hilar region. The left lung is clear. There is no pneumothorax. The right cardiac border is silhouetted by the right lung opacities. Median sternotomy wires and mechanical aortic valve as well  as pulmonic valve noted. Osteopenia. No acute fracture.  IMPRESSION: 1. Opacification of the right lung base and right apical region with only partially aerated right mid lung field. The right lung base opacities represent combination of pleural effusion, lung mass and consolidative change. Right apical opacity represents combination of loculated pleural effusion and consolidative changes of the lungs. Overall the appearance is similar to the CT of 05/16/2016. 2. Clear left lung. Electronically Signed   By: Anner Crete M.D.   On: 06/08/2016 03:33     Concern for Gi perf on CT abd  Exam 5:08 PM 06/09/2016  -same as morning -belly still soft - she is askin for somethting to eat  LABS cw sepsis BCID - MSSA Lactate cleared  Recent Labs Lab 05/21/2016 0314 05/19/2016 0637 06/12/2016 0923  LATICACIDVEN 2.82* 1.50  --   PROCALCITON  --   --  24.46    Recent Labs Lab 05/23/2016 0255 06/02/2016 0923 05/27/2016 1320  HGB 8.6* 6.8* 7.1*    Recent Labs Lab 06/03/2016 0255 06/16/2016 0923  NA 134* 132*  K 3.7 3.2*  CL 91* 93*  CO2 34* 29  GLUCOSE 98 124*  BUN 14 15  CREATININE 0.99 1.18*  CALCIUM 8.9 7.5*  MG  --  1.3*  PHOS  --  3.6     A)septic shock from MSSA bacteremia Doubt GI perfo Anemia noted Progressive lung cancer SEere hypomagnesemia Low K - corrected  P Recheck cbc and lactate at 11pm Continue vanc and zosynf romnow - 4gm mag sulfate Anemia plan - PRBC for hgb </= 6.9gm%    - exceptions are   -  if ACS susepcted/confirmed then transfuse for hgb </= 8.0gm%,  or    -  active bleeding with hemodynamic instability, then transfuse regardless of hemoglobin value   At at all times try to transfuse 1 unit prbc as possible with exception of active hemorrhage   Dr. Brand Males, M.D., [facility].C.P Pulmonary and Critical Care Medicine Staff Physician [facility] Pager: [phone], If no answer or between  15:00h - 7:00h:  call [phone]  06/08/2016 5:09 PM

## 2016-06-05 NOTE — ED Notes (Signed)
Notified critical care of critical hgb 6.8

## 2016-06-05 NOTE — Progress Notes (Signed)
Pharmacy Antibiotic Note  Marissa [name] is a 83 y.o. female admitted on 06/15/2016 with pneumonia.  Pharmacy has been consulted for Vancomycin dosing.    Plan: Vancomycin 1 IV now, then 750 mg IV q24h  Height: '5\' 3"'$  (160 cm) Weight: 109 lb (49.4 kg) IBW/kg (Calculated) : 52.4  Temp (24hrs), Avg:104.1 F (40.1 C), Min:102.9 F (39.4 C), Max:105.2 F (40.7 C)   Recent Labs Lab 06/13/2016 0255 06/13/2016 0314 05/26/2016 0637  WBC 26.5*  --   --   CREATININE 0.99  --   --   LATICACIDVEN  --  2.82* 1.50    Estimated Creatinine Clearance: 34.8 mL/min (by C-G formula based on SCr of 0.99 mg/dL).    Allergies  Allergen Reactions  . Prozac [Fluoxetine Hcl] Rash     [name] 05/25/2016 7:40 AM

## 2016-06-05 NOTE — Assessment & Plan Note (Addendum)
Hx hypertensive diastolic CHF, A Fib. Hemodynamically stable without evidence volume overload currently  Plan Careful BP control, volume management.

## 2016-06-05 NOTE — Assessment & Plan Note (Addendum)
Suspect due to post-obstructive MSSA PNA in setting malignancy. ? Contribution progression R effusion.   PLAN Attempt to come off BiPAP this am, change to prn.  Pt and daughter would want intubation if she were to decompensate R effusion loculated on CT chest, unclear that thora would be beneficial.  Broad abx, narrow 3/19

## 2016-06-05 NOTE — ED Notes (Signed)
Fluid bolus

## 2016-06-05 NOTE — Assessment & Plan Note (Addendum)
Due to MSSA bacteremia, suspect underlying MSSA PNA due to lung CA, complicated pleural space  Plan:  Follow hemodynamics Treating underlying infxn

## 2016-06-05 NOTE — Assessment & Plan Note (Addendum)
She was recommended hospice 05/23/16 for stage3A NSCLC that progressed through 2017 and early 2018 despite chemo and immune rx. Dr Worthy Flank notes reviewed. Pt and daughter elected for more chemotherapy.   Plan Clearly will need to address suitability for further chemo once acute issues addressed.

## 2016-06-05 NOTE — Progress Notes (Addendum)
ANTICOAGULATION CONSULT NOTE - Initial Consult  Pharmacy Consult for heparin Indication: atrial fibrillation, hx MV + AV replacement  Allergies  Allergen Reactions  . Prozac [Fluoxetine Hcl] Rash    Patient Measurements: Height: '5\' 3"'$  (160 cm) Weight: 109 lb (49.4 kg) IBW/kg (Calculated) : 52.4 Heparin Dosing Weight: 49.4  Vital Signs: Temp: 102.9 F (39.4 C) (03/18 0526) Temp Source: Rectal (03/18 0250) BP: 85/50 (03/18 0826) Pulse Rate: 94 (03/18 0826)  Labs:  Recent Labs  05/25/2016 0255  HGB 8.6*  HCT 30.1*  PLT 287  LABPROT 31.0*  INR 2.91  CREATININE 0.99    Estimated Creatinine Clearance: 34.8 mL/min (by C-G formula based on SCr of 0.99 mg/dL).   Medical History: Past Medical History:  Diagnosis Date  . A-fib ([place])   . Aortic stenosis    (s/p AVR w/ 21 mm porcine valve 03/2011  . Aortic valve disorders   . Arthritis   . CAD (coronary artery disease)    s/p CABG 03/2011  . CHF (congestive heart failure) ([place])   . COPD (chronic obstructive pulmonary disease) ([place])   . Dehydration 02/22/2016  . Diabetes mellitus without complication ([place])   . Dysrhythmia   . Encounter for antineoplastic chemotherapy 05/23/2016  . Encounter for antineoplastic immunotherapy 12/21/2015  . Full code status 12/22/2014  . Full code status 12/22/2014  . GERD (gastroesophageal reflux disease)    takes nexium prn  . Goals of care, counseling/discussion 05/23/2016  . History of blood transfusion    Rheumatic Fever  . HTN (hypertension)   . Hypercholesteremia   . Hypothyroidism   . Lung cancer ([place])   . Mastoiditis   . Mitral valve disease    s/p MVR 03/2011 w/ 27 mm porcine valve  . Myocardial infarction    "slight"  . Osteopenia   . Pneumonia   . Pre-diabetes   . Recurrent right pleural effusion 03/08/2016  . RLS (restless legs syndrome)   . Shortness of breath    with exertion  . Weight loss 02/22/2016    Assessment: 20 yof on chronic coumadin to convert to IV heparin  when INR decreases to <1.6 per MD. Currently INR is 2.91. H/H is slightly low and platelets are WNL. No bleeding noted.   Goal of Therapy:  Heparin level 0.3-0.7 units/ml Monitor platelets by anticoagulation protocol: Yes   Plan:  Start IV heparin when INR<1.6 per MD order Daily INR  Frona Yost, Rande Lawman 05/30/2016,9:58 AM   Addendum: CBC recheck showed Hgb of 6.8. F/u with MD plans for anticoagulation.   Salome Arnt, PharmD, BCPS Pager # [phone] 06/02/2016 10:03 AM

## 2016-06-05 NOTE — Assessment & Plan Note (Addendum)
Start synthroid 3/19

## 2016-06-06 ENCOUNTER — Ambulatory Visit: Payer: Self-pay

## 2016-06-06 ENCOUNTER — Encounter (HOSPITAL_COMMUNITY): Payer: Self-pay | Admitting: General Practice

## 2016-06-06 ENCOUNTER — Other Ambulatory Visit: Payer: Self-pay

## 2016-06-06 ENCOUNTER — Ambulatory Visit (HOSPITAL_COMMUNITY): Payer: Medicare Other

## 2016-06-06 DIAGNOSIS — N179 Acute kidney failure, unspecified: Secondary | ICD-10-CM

## 2016-06-06 DIAGNOSIS — I700 Atherosclerosis of aorta: Secondary | ICD-10-CM

## 2016-06-06 DIAGNOSIS — D508 Other iron deficiency anemias: Secondary | ICD-10-CM

## 2016-06-06 DIAGNOSIS — C3411 Malignant neoplasm of upper lobe, right bronchus or lung: Secondary | ICD-10-CM

## 2016-06-06 DIAGNOSIS — I2510 Atherosclerotic heart disease of native coronary artery without angina pectoris: Secondary | ICD-10-CM

## 2016-06-06 DIAGNOSIS — C3431 Malignant neoplasm of lower lobe, right bronchus or lung: Secondary | ICD-10-CM

## 2016-06-06 DIAGNOSIS — R6520 Severe sepsis without septic shock: Secondary | ICD-10-CM

## 2016-06-06 DIAGNOSIS — J9621 Acute and chronic respiratory failure with hypoxia: Secondary | ICD-10-CM

## 2016-06-06 DIAGNOSIS — B9561 Methicillin susceptible Staphylococcus aureus infection as the cause of diseases classified elsewhere: Secondary | ICD-10-CM | POA: Diagnosis present

## 2016-06-06 DIAGNOSIS — Z801 Family history of malignant neoplasm of trachea, bronchus and lung: Secondary | ICD-10-CM

## 2016-06-06 DIAGNOSIS — Z952 Presence of prosthetic heart valve: Secondary | ICD-10-CM

## 2016-06-06 DIAGNOSIS — Z888 Allergy status to other drugs, medicaments and biological substances status: Secondary | ICD-10-CM

## 2016-06-06 DIAGNOSIS — Z87891 Personal history of nicotine dependence: Secondary | ICD-10-CM

## 2016-06-06 DIAGNOSIS — Z9981 Dependence on supplemental oxygen: Secondary | ICD-10-CM

## 2016-06-06 DIAGNOSIS — J15211 Pneumonia due to Methicillin susceptible Staphylococcus aureus: Secondary | ICD-10-CM

## 2016-06-06 DIAGNOSIS — R7881 Bacteremia: Secondary | ICD-10-CM

## 2016-06-06 DIAGNOSIS — A4101 Sepsis due to Methicillin susceptible Staphylococcus aureus: Principal | ICD-10-CM

## 2016-06-06 LAB — GLUCOSE, CAPILLARY
GLUCOSE-CAPILLARY: 70 mg/dL (ref 65–99)
GLUCOSE-CAPILLARY: 56 mg/dL — AB (ref 65–99)
GLUCOSE-CAPILLARY: 69 mg/dL (ref 65–99)
GLUCOSE-CAPILLARY: 72 mg/dL (ref 65–99)
Glucose-Capillary: 56 mg/dL — ABNORMAL LOW (ref 65–99)
Glucose-Capillary: 75 mg/dL (ref 65–99)

## 2016-06-06 LAB — BLOOD CULTURE ID PANEL (REFLEXED)
Acinetobacter baumannii: NOT DETECTED
CANDIDA KRUSEI: NOT DETECTED
CANDIDA TROPICALIS: NOT DETECTED
Candida albicans: NOT DETECTED
Candida glabrata: NOT DETECTED
Candida parapsilosis: NOT DETECTED
ENTEROCOCCUS SPECIES: NOT DETECTED
Enterobacter cloacae complex: NOT DETECTED
Enterobacteriaceae species: NOT DETECTED
Escherichia coli: NOT DETECTED
Haemophilus influenzae: NOT DETECTED
KLEBSIELLA PNEUMONIAE: NOT DETECTED
Klebsiella oxytoca: NOT DETECTED
Listeria monocytogenes: NOT DETECTED
Methicillin resistance: NOT DETECTED
NEISSERIA MENINGITIDIS: NOT DETECTED
PROTEUS SPECIES: NOT DETECTED
Pseudomonas aeruginosa: NOT DETECTED
SERRATIA MARCESCENS: NOT DETECTED
STAPHYLOCOCCUS AUREUS BCID: DETECTED — AB
STAPHYLOCOCCUS SPECIES: DETECTED — AB
STREPTOCOCCUS SPECIES: NOT DETECTED
Streptococcus agalactiae: NOT DETECTED
Streptococcus pneumoniae: NOT DETECTED
Streptococcus pyogenes: NOT DETECTED

## 2016-06-06 LAB — CBC
HCT: 25.2 % — ABNORMAL LOW (ref 36.0–46.0)
HEMATOCRIT: 25.8 % — AB (ref 36.0–46.0)
HEMOGLOBIN: 7.4 g/dL — AB (ref 12.0–15.0)
HEMOGLOBIN: 7.1 g/dL — AB (ref 12.0–15.0)
MCH: 20.5 pg — AB (ref 26.0–34.0)
MCH: 20 pg — ABNORMAL LOW (ref 26.0–34.0)
MCHC: 28.7 g/dL — ABNORMAL LOW (ref 30.0–36.0)
MCHC: 28.2 g/dL — ABNORMAL LOW (ref 30.0–36.0)
MCV: 71.5 fL — AB (ref 78.0–100.0)
MCV: 71 fL — ABNORMAL LOW (ref 78.0–100.0)
Platelets: 210 10*3/uL (ref 150–400)
Platelets: 184 10*3/uL (ref 150–400)
RBC: 3.61 MIL/uL — AB (ref 3.87–5.11)
RBC: 3.55 MIL/uL — AB (ref 3.87–5.11)
RDW: 17.4 % — ABNORMAL HIGH (ref 11.5–15.5)
RDW: 17.4 % — ABNORMAL HIGH (ref 11.5–15.5)
WBC: 16.4 10*3/uL — ABNORMAL HIGH (ref 4.0–10.5)
WBC: 11.7 10*3/uL — AB (ref 4.0–10.5)

## 2016-06-06 LAB — BASIC METABOLIC PANEL
Anion gap: 11 (ref 5–15)
BUN: 21 mg/dL — AB (ref 6–20)
CHLORIDE: 101 mmol/L (ref 101–111)
CO2: 26 mmol/L (ref 22–32)
Calcium: 7.7 mg/dL — ABNORMAL LOW (ref 8.9–10.3)
Creatinine, Ser: 1.36 mg/dL — ABNORMAL HIGH (ref 0.44–1.00)
GFR calc Af Amer: 41 mL/min — ABNORMAL LOW (ref >60)
GFR calc non Af Amer: 35 mL/min — ABNORMAL LOW (ref >60)
GLUCOSE: 51 mg/dL — AB (ref 65–99)
POTASSIUM: 3.4 mmol/L — AB (ref 3.5–5.1)
Sodium: 138 mmol/L (ref 135–145)

## 2016-06-06 LAB — POCT I-STAT 3, ART BLOOD GAS (G3+)
Acid-Base Excess: 1 mmol/L (ref 0.0–2.0)
BICARBONATE: 27.3 mmol/L (ref 20.0–28.0)
O2 Saturation: 75 %
PH ART: 7.352 (ref 7.350–7.450)
PO2 ART: 41 mmHg — AB (ref 83.0–108.0)
TCO2: 29 mmol/L (ref 0–100)
pCO2 arterial: 49 mmHg — ABNORMAL HIGH (ref 32.0–48.0)

## 2016-06-06 LAB — PHOSPHORUS: Phosphorus: 4.1 mg/dL (ref 2.5–4.6)

## 2016-06-06 LAB — PROTIME-INR
INR: 3.49
Prothrombin Time: 35.8 seconds — ABNORMAL HIGH (ref 11.4–15.2)

## 2016-06-06 LAB — MAGNESIUM: Magnesium: 2.6 mg/dL — ABNORMAL HIGH (ref 1.7–2.4)

## 2016-06-06 MED ORDER — VANCOMYCIN HCL 500 MG IV SOLR: 500.0000 mg | INTRAVENOUS | Status: DC

## 2016-06-06 MED ORDER — CHLORHEXIDINE GLUCONATE 0.12 % MT SOLN
15.0000 mL | Freq: Two times a day (BID) | OROMUCOSAL | Status: DC
  Administered 2016-06-06 – 2016-06-07 (×3): 15 mL via OROMUCOSAL

## 2016-06-06 MED ORDER — DEXTROSE 50 % IV SOLN
INTRAVENOUS | Status: AC
  Administered 2016-06-06: 25 mL
  Filled 2016-06-06: qty 50

## 2016-06-06 MED ORDER — ORAL CARE MOUTH RINSE
15.0000 mL | Freq: Two times a day (BID) | OROMUCOSAL | Status: DC
  Administered 2016-06-06 – 2016-06-07 (×3): 15 mL via OROMUCOSAL

## 2016-06-06 MED ORDER — SODIUM CHLORIDE 0.9 % IV SOLN
30.0000 meq | Freq: Once | INTRAVENOUS | Status: AC
  Administered 2016-06-06: 30 meq via INTRAVENOUS
  Filled 2016-06-06: qty 15

## 2016-06-06 MED ORDER — INSULIN ASPART 100 UNIT/ML ~~LOC~~ SOLN: 0.0000 [IU] | SUBCUTANEOUS | Status: DC

## 2016-06-06 MED ORDER — DEXTROSE-NACL 5-0.45 % IV SOLN
INTRAVENOUS | Status: DC
  Administered 2016-06-06: 16:00:00 via INTRAVENOUS
  Administered 2016-06-07: 50 mL/h via INTRAVENOUS

## 2016-06-06 MED ORDER — CEFAZOLIN SODIUM-DEXTROSE 2-4 GM/100ML-% IV SOLN
2.0000 g | Freq: Two times a day (BID) | INTRAVENOUS | Status: DC
  Administered 2016-06-06 – 2016-06-07 (×3): 2 g via INTRAVENOUS
  Filled 2016-06-06 (×3): qty 100

## 2016-06-06 MED ORDER — LEVOTHYROXINE SODIUM 100 MCG IV SOLR
50.0000 ug | Freq: Every day | INTRAVENOUS | Status: DC
  Administered 2016-06-06 – 2016-06-07 (×2): 50 ug via INTRAVENOUS
  Filled 2016-06-06 (×2): qty 5

## 2016-06-06 NOTE — Consult Note (Signed)
[facility]    Date of Admission:  05/26/2016   Total days of antibiotics 2        Day 1 Cefazolin               Reason for Consult: MSSA Bacteremia    Referring Physician: Dr. Lamonte Sakai Primary Care Physician: Dr. Marisue Humble  Principal Problem:   MSSA Bacteremia Active Problems:   CAD (coronary artery disease)   Primary cancer of right upper lobe of lung (HCC)   Severe sepsis (HCC)   Acute on chronic respiratory failure with hypoxia and hypercapnia (HCC)   Hypertensive heart disease with CHF (congestive heart failure) (HCC)   A-fib (HCC)   COPD (chronic obstructive pulmonary disease) ([place])   Aortic valve replaced   History of mitral valve replacement   Recurrent right pleural effusion   Hypercholesteremia   RLS (restless legs syndrome)   Osteopenia   Hypothyroidism   Full code status   Type 2 diabetes mellitus (HCC)   Anemia, chronic disease   .  ceFAZolin (ANCEF) IV  2 g Intravenous Q12H  . chlorhexidine  15 mL Mouth Rinse BID  . insulin aspart  0-9 Units Subcutaneous Q4H  . ipratropium-albuterol  3 mL Nebulization Q6H  . levothyroxine  50 mcg Intravenous Daily  . mouth rinse  15 mL Mouth Rinse q12n4p  . pantoprazole (PROTONIX) IV  40 mg Intravenous QHS  . potassium chloride (KCL MULTIRUN) 30 mEq in 265 mL IVPB  30 mEq Intravenous Once    Recommendations: 1. Continue Cefazolin for MSSA bacteremia. Patient will need to be ruled out for endocarditis, especially given history of aortic and mitral valve replacement. Obtain TTE and possibly TEE if indicated. Patient will need at least 2 weeks of IV cefazolin therapy for bacteremia, longer if endocarditis is present. Follow blood cultures from 3/19.  Assessment: 1. MSSA Bacteremia with Sepsis: Likely secondary to underlying postobstructive pneumonia. Patient presented on 3/18 with respiratory distress and found to have fever with Tmax of 105.2 on admission, leukocytosis trending down from 26.5-16.4  today, lactic acidosis, tachycardia, hypotension found to be in septic shock. Blood cultures from 3/18 grew MSSA in 2 out of 2 blood cultures. Blood cultures were redrawn today, 3/19. Today, patient is hypothermic at 96.7. Patient was initially started on vancomycin and Zosyn for concern of postobstructive pneumonia, and transitioned to cefazolin on 3/19. Despite 30 mL/kg IV fluid resuscitation, patient was progressively hypotensive and started on Neo-Synephrine. Septic shock is now resolved and patient is off pressors.  2. Acute on Chronic Respiratory Failure 2/2 Post-Obstructive MSSA PNA: Patient is on chronic oxygen at home. This hospitalization she has required BiPAP, but not intubation. She has a history of underlying COPD and non-small cell lung cancer. CT chest showed a moderate sized right pleural effusion with loculation.  3. 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 She follows with Dr. Earlie Server, oncology. Patient was treated with chemoradiation (platinum and Taxol) and immunotherapy with Nivolumab stopped on 02/19/2016 secondary to to see disease progression. Plans were to start systemic chemotherapy with gemcitabine on 06/06/2016. CT chest from this admission showed increased size of known right middle lobe malignancy with chest wall invasion and mediastinal adenopathy.  4. AKI: Patient has normal renal function at baseline. Creatinine increased to 1.36 this morning. Likely secondary to hypotension.  5. Acute on Chronic Microcytic Anemia: Patient transfused for hemoglobin of 6.8. Hgb 7.4 this morning.  HPI: Marissa [name] is a 83 y.o. female with past medical history of atrial fibrillation, chronic HFpEF, aortic valve replacement, COPD, CAD, aortic atherosclerosis, chronic microcytic anemia, type 2 diabetes, hypothyroidism, and progressive non-small cell lung cancer of the right lobe who presented to the emergency department on 06/11/2016  with worsening dyspnea over the last 1 day. Patient presented on 3/18 with respiratory distress and found to have fever with Tmax of 105.2 on admission, leukocytosis trending down from 26.5-16.4 today, lactic acidosis, tachycardia, hypotension. Blood cultures from 3/18 grew MSSA in 2 out of 2 blood cultures. Blood cultures were redrawn today, 3/19. Today, patient is hypothermic at 96.7. Patient was initially started on vancomycin and Zosyn for concern of postobstructive pneumonia. Despite 30 mL/kg IV fluid resuscitation, patient was progressively hypotensive and started on Neo-Synephrine. Patient was admitted to the ICU for treatment of MSSA bacteremia and postobstructive pneumonia. She has remained on BiPAP, but has not required intubation. This morning, patient states she feels better. She continues to have dyspnea and occasional productive cough, similar to her baseline cough. She denies fever, chills, nausea, vomiting, diarrhea, abdominal pain, constipation, chest pain.    ROS  General: Admits to weight loss. Denies fever, chills, change in appetite and diaphoresis.  Respiratory: Admits to SOB, productive cough, DOE, and wheezing.   Cardiovascular: Denies chest pain and palpitations.  Gastrointestinal: Denies nausea, vomiting, abdominal pain, diarrhea, constipation, blood in stool and abdominal distention.  Genitourinary: Denies dysuria, urgency, frequency. Musculoskeletal: Denies myalgias, back pain, arthralgias Skin: Denies rash and wounds.  Neurological: Denies dizziness, headaches, weakness, lightheadedness. Psychiatric/Behavioral: Denies confusion, anxiety.   Past Medical History:  Diagnosis Date  . A-fib ([place])   . Aortic stenosis    (s/p AVR w/ 21 mm porcine valve 03/2011  . Aortic valve disorders   . Arthritis   . CAD (coronary artery disease)    s/p CABG 03/2011  . CHF (congestive heart failure) ([place])   . COPD (chronic obstructive pulmonary disease) ([place])   . Dehydration 02/22/2016   . Diabetes mellitus without complication ([place])   . Dysrhythmia   . Encounter for antineoplastic chemotherapy 05/23/2016  . Encounter for antineoplastic immunotherapy 12/21/2015  . Full code status 12/22/2014  . Full code status 12/22/2014  . GERD (gastroesophageal reflux disease)    takes nexium prn  . Goals of care, counseling/discussion 05/23/2016  . History of blood transfusion    Rheumatic Fever  . HTN (hypertension)   . Hypercholesteremia   . Hypothyroidism   . Lung cancer ([place])   . Mastoiditis   . Mitral valve disease    s/p MVR 03/2011 w/ 27 mm porcine valve  . Myocardial infarction    "slight"  . Osteopenia   . Pneumonia   . Pre-diabetes   . Recurrent right pleural effusion 03/08/2016  . RLS (restless legs syndrome)   . Shortness of breath    with exertion  . Weight loss 02/22/2016    Social History  Substance Use Topics  . Smoking status: Former Smoker    Packs/day: 0.50    Years: 30.00    Types: Cigarettes  . Smokeless tobacco: Never Used  . Alcohol use No    Family History  Problem Relation Age of Onset  . Adopted: Yes  . Cancer Brother     lung  . Heart attack Neg Hx   . Stroke Neg Hx    Allergies  Allergen Reactions  . Prozac [Fluoxetine Hcl] Rash    OBJECTIVE: Physical Exam Vitals:  06/06/16 0900 06/06/16 0934 06/06/16 1000 06/06/16 1100  BP: (!) 116/104 122/68 115/88 (!) 104/54  Pulse: 95 (!) 106 98 92  Resp: (!) 22 (!) 47 (!) 21 (!) 27  Temp:      TempSrc:      SpO2: 100% (!) 82% 96% 100%  Weight:      Height:       General: Vital signs reviewed.  Patient is chronically ill appearing, thin, in no acute distress with Bipap in place and cooperative with exam.  Head: Normocephalic and atraumatic. Eyes: PERRLA, conjunctivae normal, no scleral icterus.  Neck: Supple, trachea midline, no carotid bruit present.  Cardiovascular: Irregularly irregular, tachycardic, 3/6 systolic click.  Pulmonary/Chest: Decreased breath sounds in right mid to  lower lung. Diffuse expiratory wheezes. Abdominal: Soft, non-tender, non-distended, hypoactive BS, no guarding present.  Extremities: Trace lower extremity edema bilaterally,  pulses symmetric and intact bilaterally. No cyanosis or clubbing. Neurological: Awake and alert, answers questions appropriately, moves all extremities  Skin: Warm, dry and intact. No rashes or erythema. Scattered ecchymoses. Psychiatric: Normal mood and affect. speech and behavior is normal.   Lab Results Lab Results  Component Value Date   WBC 16.4 (H) 06/06/2016   HGB 7.4 (L) 06/06/2016   HCT 25.8 (L) 06/06/2016   MCV 71.5 (L) 06/06/2016   PLT 210 06/06/2016    Lab Results  Component Value Date   CREATININE 1.36 (H) 06/06/2016   BUN 21 (H) 06/06/2016   NA 138 06/06/2016   K 3.4 (L) 06/06/2016   CL 101 06/06/2016   CO2 26 06/06/2016    Lab Results  Component Value Date   ALT 12 (L) 06/13/2016   AST 24 05/31/2016   ALKPHOS 111 05/25/2016   BILITOT 0.8 06/03/2016     Microbiology: Recent Results (from the past 240 hour(s))  Blood Culture (routine x 2)     Status: None (Preliminary result)   Collection Time: 05/29/2016  2:55 AM  Result Value Ref Range Status   Specimen Description BLOOD RIGHT ANTECUBITAL  Final   Special Requests BOTTLES DRAWN AEROBIC ONLY 5CC  Final   Culture  Setup Time   Final    GRAM POSITIVE COCCI IN CLUSTERS AEROBIC BOTTLE ONLY CRITICAL VALUE NOTED.  VALUE IS CONSISTENT WITH PREVIOUSLY REPORTED AND CALLED VALUE.    Culture GRAM POSITIVE COCCI  Final   Report Status PENDING  Incomplete  Blood Culture (routine x 2)     Status: Abnormal (Preliminary result)   Collection Time: 05/23/2016  3:06 AM  Result Value Ref Range Status   Specimen Description BLOOD LEFT ANTECUBITAL  Final   Special Requests IN PEDIATRIC BOTTLE 3CC  Final   Culture  Setup Time   Final    GRAM POSITIVE COCCI IN CLUSTERS IN PEDIATRIC BOTTLE CRITICAL RESULT CALLED TO, READ BACK BY AND VERIFIED WITH: EHassell Done, [place], 06/18/2016 AT 1632 BY J FUDESCO    Culture (A)  Final    STAPHYLOCOCCUS AUREUS SUSCEPTIBILITIES TO FOLLOW    Report Status PENDING  Incomplete  Blood Culture ID Panel (Reflexed)     Status: Abnormal   Collection Time: 05/22/2016  3:06 AM  Result Value Ref Range Status   Enterococcus species NOT DETECTED NOT DETECTED Final   Listeria monocytogenes NOT DETECTED NOT DETECTED Final   Staphylococcus species DETECTED (A) NOT DETECTED Final    Comment: CRITICAL RESULT CALLED TO, READ BACK BY AND VERIFIED WITH: E. MARTIN,PHARMD 05/31/2016 AT 1632 J FUDESCO    Staphylococcus aureus  DETECTED (A) NOT DETECTED Final    Comment: Methicillin (oxacillin) susceptible Staphylococcus aureus (MSSA). Preferred therapy is anti staphylococcal beta lactam antibiotic (Cefazolin or Nafcillin), unless clinically contraindicated. CRITICAL RESULT CALLED TO, READ BACK BY AND VERIFIED WITH: E. MARTIN, PHARM, 05/23/2016 AT 1632 BY J FUDESCO    Methicillin resistance NOT DETECTED NOT DETECTED Final   Streptococcus species NOT DETECTED NOT DETECTED Final   Streptococcus agalactiae NOT DETECTED NOT DETECTED Final   Streptococcus pneumoniae NOT DETECTED NOT DETECTED Final   Streptococcus pyogenes NOT DETECTED NOT DETECTED Final   Acinetobacter baumannii NOT DETECTED NOT DETECTED Final   Enterobacteriaceae species NOT DETECTED NOT DETECTED Final   Enterobacter cloacae complex NOT DETECTED NOT DETECTED Final   Escherichia coli NOT DETECTED NOT DETECTED Final   Klebsiella oxytoca NOT DETECTED NOT DETECTED Final   Klebsiella pneumoniae NOT DETECTED NOT DETECTED Final   Proteus species NOT DETECTED NOT DETECTED Final   Serratia marcescens NOT DETECTED NOT DETECTED Final   Haemophilus influenzae NOT DETECTED NOT DETECTED Final   Neisseria meningitidis NOT DETECTED NOT DETECTED Final   Pseudomonas aeruginosa NOT DETECTED NOT DETECTED Final   Candida albicans NOT DETECTED NOT DETECTED Final   Candida glabrata NOT  DETECTED NOT DETECTED Final   Candida krusei NOT DETECTED NOT DETECTED Final   Candida parapsilosis NOT DETECTED NOT DETECTED Final   Candida tropicalis NOT DETECTED NOT DETECTED Final  Respiratory Panel by PCR     Status: None   Collection Time: 05/25/2016  9:15 AM  Result Value Ref Range Status   Adenovirus NOT DETECTED NOT DETECTED Final   Coronavirus 229E NOT DETECTED NOT DETECTED Final   Coronavirus HKU1 NOT DETECTED NOT DETECTED Final   Coronavirus NL63 NOT DETECTED NOT DETECTED Final   Coronavirus OC43 NOT DETECTED NOT DETECTED Final   Metapneumovirus NOT DETECTED NOT DETECTED Final   Rhinovirus / Enterovirus NOT DETECTED NOT DETECTED Final   Influenza A NOT DETECTED NOT DETECTED Final   Influenza B NOT DETECTED NOT DETECTED Final   Parainfluenza Virus 1 NOT DETECTED NOT DETECTED Final   Parainfluenza Virus 2 NOT DETECTED NOT DETECTED Final   Parainfluenza Virus 3 NOT DETECTED NOT DETECTED Final   Parainfluenza Virus 4 NOT DETECTED NOT DETECTED Final   Respiratory Syncytial Virus NOT DETECTED NOT DETECTED Final   Bordetella pertussis NOT DETECTED NOT DETECTED Final   Chlamydophila pneumoniae NOT DETECTED NOT DETECTED Final   Mycoplasma pneumoniae NOT DETECTED NOT DETECTED Final  MRSA PCR Screening     Status: None   Collection Time: 06/17/2016 11:26 AM  Result Value Ref Range Status   MRSA by PCR NEGATIVE NEGATIVE Final    Comment:        The GeneXpert MRSA Assay (FDA approved for NASAL specimens only), is one component of a comprehensive MRSA colonization surveillance program. It is not intended to diagnose MRSA infection nor to guide or monitor treatment for MRSA infections.    Imaging: CXR 3/18: 1. Opacification of the right lung base and right apical region with only partially aerated right mid lung field. The right lung base opacities represent combination of pleural effusion, lung mass and consolidative change. Right apical opacity represents combination  of loculated pleural effusion and consolidative changes of the lungs. Overall the appearance is similar to the CT of 05/16/2016. 2. Clear left lung.  CT Chest wo contrast 3/18: 1. Interval linear branching area in the periphery of the liver anteriorly. This is concerning for portal venous gas. This can be seen  with intestinal necrosis. If the patient has corresponding abdominal symptoms, an abdomen and pelvis CT with contrast would be recommended. 2. Increased size of the known right middle lobe malignancy with chest wall invasion. This currently measures 6.9 x 5.5 cm in the axial plane. 3. Progressive destruction of the involved right third rib as well as a subacute right third rib fracture. 4. Progressive right supraclavicular metastatic adenopathy with stable mild subcarinal metastatic adenopathy. 5. Stable moderate-sized right pleural effusion with loculation. 6. Stable left lower lobe atelectasis. 7. Extensive coronary artery atherosclerosis and aortic atherosclerosis.  Martyn Malay, DO PGY-3 Internal Medicine Resident Pager # [phone] 06/06/2016 11:12 AM

## 2016-06-06 NOTE — Care Management Note (Signed)
Case Management Note  Patient Details  Name: Marissa [name] MRN: [id number] Date of Birth: 08-Jan-1934  Subjective/Objective:   Pt admitted with resp failure - reccurrent lung CA stage 3                 Action/Plan:  From home on supplemental oxygen - not currently in treatment for CA.  Palliative consulted.  Pt now requiring BIPAP.  CM will continue to follow for discharge needs   Expected Discharge Date:                  Expected Discharge Plan:     In-House Referral:  Clinical Social Work  Discharge planning Services  CM Consult  Post Acute Care Choice:    Choice offered to:     DME Arranged:    DME Agency:     HH Arranged:    [place] Agency:     Status of Service:  In process, will continue to follow  If discussed at Long Length of Stay Meetings, dates discussed:    Additional Comments:  Marissa Labrador, RN 06/06/2016, 2:11 PM

## 2016-06-06 NOTE — Progress Notes (Signed)
eLink Physician-Brief Progress Note Patient Name: Marissa [name] DOB: [birth year] MRN: [id number]   Date of Service  06/06/2016  HPI/Events of Note  Patient retaining urine.   eICU Interventions  INSERT foley now.  Will let am team assess whether we can d/c foley in am.      Intervention Category Intermediate Interventions: Other:  Rush Landmark 06/06/2016, 5:57 PM

## 2016-06-06 NOTE — Progress Notes (Signed)
Pharmacy Antibiotic Note  Marissa [name] is a 83 y.o. female admitted on 06/17/2016 with pneumonia.  Pharmacy has been consulted for vancomycin and Zosyn dosing.  SCr increase from 0.99 to 1.36 with low UOP recorded as 0.3 cc/kg/hr.   Plan: Reduce vancomycin to 500 mg IV every 24 hours.  Continue Zosyn 3.375g IV every 8 hours - 4 hour infusion.  Monitor renal function, culture results and clinical status.  Will monitor UOP today and consider reducing further versus checking a level in AM.    Height: '5\' 3"'$  (160 cm) Weight: 122 lb 5.7 oz (55.5 kg) IBW/kg (Calculated) : 52.4  Temp (24hrs), Avg:97.9 F (36.6 C), Min:96.7 F (35.9 C), Max:100.3 F (37.9 C)   Recent Labs Lab 05/27/2016 0255 06/04/2016 0314 06/15/2016 0637 05/22/2016 0923 06/10/2016 1320 05/19/2016 2000 05/30/2016 2300 06/06/16 0400  WBC 26.5*  --   --  27.8* 28.0* 20.1* 18.6* 16.4*  CREATININE 0.99  --   --  1.18*  --   --   --  1.36*  LATICACIDVEN  --  2.82* 1.50  --   --   --  0.9  --     Estimated Creatinine Clearance: 26.8 mL/min (A) (by C-G formula based on SCr of 1.36 mg/dL (H)).    Allergies  Allergen Reactions  . Prozac [Fluoxetine Hcl] Rash    Antimicrobials this admission: Vanc 3/18>> Zosyn 3/18>>  Dose adjustments this admission: 3/19 Reduced vancomycin from '750mg'$  IV every 24h to   Microbiology results: 3/17 BCx - 2/2 GPC clusters 3/17 BCID - MSSA 3/18 MRSA pcr negative 3/18 Resp panel negative  Thank you for allowing pharmacy to be a part of this patient's care.  Brain Hilts 06/06/2016 7:26 AM

## 2016-06-06 NOTE — Progress Notes (Signed)
Palliative MD called to set up meeting with family. Family did not come back to bedside. Will follow-up with palliative tomorrow.

## 2016-06-06 NOTE — Progress Notes (Signed)
Morning ABG retrieved from pt. And sample possibly mixed venous. Pt. Did not tolerate the procedure well. MD was notified by RT with results.

## 2016-06-06 NOTE — Progress Notes (Signed)
Pt taken off bipap and placed on 4L  at this time. Pt stable throughout with no complications. VS within normal limits. RN made aware. RT will continue to monitor.

## 2016-06-06 NOTE — Progress Notes (Addendum)
PULMONARY / CRITICAL CARE MEDICINE   Name: Marissa [name] MRN: [id number] DOB: 1933/06/24    ADMISSION DATE:  05/26/2016 CONSULTATION DATE:  06/13/2016   REFERRING MD:  Dr Vilinda Blanks of ER  CHIEF COMPLAINT:  resp failure and septic shock in 83year old with lung cancer, full code  HISTORY OF PRESENT ILLNESS:   83 year old cachectic female with diast chf hx, a history of lung cancer followed by Dr. Julien Nordmann for stage IIIa T2, N2, M0 squamous cell carcinoma with recurrence in the right upper lobe as well as mediastinal adenopathy diagnosed in September 2016. She status post conventional platinum and Taxol therapy and also immunotherapy with Nivolumab status post 5 cycles ending December 2017 and discontinued due to disease progression. Last seen Dr. Julien Nordmann on 05/23/2016 and hospice care recommended but patient and daughter opted for single agent gemcitabine therapy to be started tomorrow  06/06/2016  She presented in 05/30/2016 to the emergency department with worsening dyspnea over the last 24 hours and worsening hypoxemia despite using her usual 2 L of oxygen and fever found to have 105 in the emergency room. In the emergency department ended up hypotensive requiring vasopressors for central line and needing BiPAP therapy for worsening respiratory distress. Chest x-ray showed increase right-sided lung cancer findings of opacitivcation and possible pelural effuson. Pulmonary critical care call for admission  Events 06/09/2016 -admit, biopap and Left IJ and neo  Interval / Subjective:  On BiPAP,  Hemodynamically improved Note cx data   VITAL SIGNS: BP (!) 107/46   Pulse 89   Temp (!) 96.7 F (35.9 C) (Axillary)   Resp (!) 21   Ht '5\' 3"'$  (1.6 m)   Wt 55.5 kg (122 lb 5.7 oz)   SpO2 100%   BMI 21.67 kg/m   HEMODYNAMICS:    VENTILATOR SETTINGS: FiO2 (%):  [40 %] 40 %  INTAKE / OUTPUT: I/O last 3 completed shifts: In: 3499.4 [I.V.:3124.4; IV Piggyback:375] Out: 400  [Urine:400]  EXAM Vitals:   06/06/16 0600 06/06/16 0700 06/06/16 0709 06/06/16 0715  BP: (!) 114/56 (!) 107/46 (!) 107/46   Pulse: 87 89 89   Resp: (!) 23 (!) 31 (!) 21   Temp:    (!) 96.7 F (35.9 C)  TempSrc:    Axillary  SpO2: 100% 99% 100%   Weight:      Height:       Gen: Ill appearing elderly woman on BiPAP  ENT: Op clear, dry, PERRL  Neck: No JVD, no stridor  Lungs: coarse on R, distant B, no wheeze  Cardiovascular: irregular, tachy, 2/6 S murmur  Abdomen: soft, NT, + BS  Musculoskeletal: No deformities  Neuro: awake and interacting, followed commands and answered questions on biPAP  Skin: stage 1 sacral wound reported by RN    LABS  PULMONARY  Recent Labs Lab 05/23/2016 0444 06/06/16 0236  PHART 7.386 7.352  PCO2ART 59.2* 49.0*  PO2ART 137.0* 41.0*  HCO3 34.3* 27.3  TCO2 36 29  O2SAT 98.0 75.0    CBC  Recent Labs Lab 05/30/2016 2000 05/24/2016 2300 06/06/16 0400  HGB 7.3* 7.4* 7.4*  HCT 24.9* 25.4* 25.8*  WBC 20.1* 18.6* 16.4*  PLT 215 227 210    COAGULATION  Recent Labs Lab 05/30/2016 0255 06/08/2016 0923 06/06/16 0400  INR 2.91 2.81 3.49    CARDIAC    Recent Labs Lab 06/09/2016 0923 06/16/2016 2111  TROPONINI 0.05* 0.03*   No results for input(s): PROBNP in the last 168 hours.   CHEMISTRY  Recent Labs Lab 05/27/2016 0255 06/06/2016 0923 06/06/16 0400  NA 134* 132* 138  K 3.7 3.2* 3.4*  CL 91* 93* 101  CO2 34* 29 26  GLUCOSE 98 124* 51*  BUN 14 15 21*  CREATININE 0.99 1.18* 1.36*  CALCIUM 8.9 7.5* 7.7*  MG  --  1.3* 2.6*  PHOS  --  3.6 4.1   Estimated Creatinine Clearance: 26.8 mL/min (A) (by C-G formula based on SCr of 1.36 mg/dL (H)).   LIVER  Recent Labs Lab 05/25/2016 0255 06/03/2016 0923 06/06/16 0400  AST 27 24  --   ALT 14 12*  --   ALKPHOS 137* 111  --   BILITOT 1.0 0.8  --   PROT 6.7 5.2*  --   ALBUMIN 2.9* 2.2*  --   INR 2.91 2.81 3.49     INFECTIOUS  Recent Labs Lab 06/01/2016 0314  06/13/2016 0637 06/15/2016 0923 06/05/2016 2300  LATICACIDVEN 2.82* 1.50  --  0.9  PROCALCITON  --   --  24.46  --      ENDOCRINE CBG (last 3)   Recent Labs  05/30/2016 1121  GLUCAP 94     IMAGING x48h  - image(s) personally visualized  -   highlighted in bold Ct Chest Wo Contrast  Result Date: 06/15/2016 CLINICAL DATA:  Respiratory distress and fever. History of lung cancer. EXAM: CT CHEST WITHOUT CONTRAST TECHNIQUE: Multidetector CT imaging of the chest was performed following the standard protocol without IV contrast. COMPARISON:  Portable chest obtained earlier today. Chest CT dated 05/16/2016. FINDINGS: Cardiovascular: Dense coronary artery and aortic calcifications. Mildly enlarged heart with stable marked left atrial enlargement. Mediastinum/Nodes: The previously demonstrated 2.7 cm short axis right supraclavicular lymph node has a short axis diameter of 3.5 cm today on image number 19 of series 201. The previously demonstrated 1.4 cm short axis subcarinal node is not as well delineated today without intravenous contrast, with an approximate short axis diameter of 1.4 cm on image number 45 of series 201. Lungs/Pleura: Left lower lobe atelectasis with little change. Stable right upper hemithorax postradiation changes and consolidation. Stable moderate size right pleural effusion with loculations. The previously demonstrated 5.6 x 4.8 cm necrotic mass along the anterior aspect of the right middle lobe is not as well delineated without intravenous contrast today. This mass measures approximately 6.9 x 5.5 cm on image number 48 of series 201 with mildly progressive extension through the chest wall anteriorly. Upper Abdomen: Interval linear branching air in the periphery of the liver anteriorly. Cholecystectomy clips. Interval small amount of free peritoneal fluid adjacent to the spleen. There is probable partially imaged hepatic flexure in the region of the gallbladder fossa. Musculoskeletal:  Progressive destruction of the anterolateral aspect of the right third rib, encased by the tumor extending through the chest wall. Mildly displaced right lateral third rib fracture with bridging callus and well visualized fracture line. IMPRESSION: 1. Interval linear branching area in the periphery of the liver anteriorly. This is concerning for portal venous gas. This can be seen with intestinal necrosis. If the patient has corresponding abdominal symptoms, an abdomen and pelvis CT with contrast would be recommended. 2. Increased size of the known right middle lobe malignancy with chest wall invasion. This currently measures 6.9 x 5.5 cm in the axial plane. 3. Progressive destruction of the involved right third rib as well as a subacute right third rib fracture. 4. Progressive right supraclavicular metastatic adenopathy with stable mild subcarinal metastatic adenopathy. 5. Stable moderate-sized right  pleural effusion with loculation. 6. Stable left lower lobe atelectasis. 7. Extensive coronary artery atherosclerosis and aortic atherosclerosis. Critical Value/emergent results were called by telephone at the time of interpretation on 06/03/2016 at 11:06 am to Dr. Brand Males , who verbally acknowledged these results. Electronically Signed   By: Claudie Revering M.D.   On: 05/22/2016 11:11   Dg Chest Port 1 View  Result Date: 05/30/2016 CLINICAL DATA:  83 year old female with shortness of breath and respiratory distress. History of non-small-cell carcinoma of the right lung. EXAM: PORTABLE CHEST 1 VIEW COMPARISON:  Chest radiograph dated 03/08/2016 and CT dated 05/16/2016 FINDINGS: There is some the right pleural effusion. Right lung base opacity likely combination of pleural effusion and right lung mass and consolidative changes. There is an area of airspace opacity in the right apical region corresponding to loculated pleural effusion and consolidative changes of the lungs seen on the prior CT. There is a small  aerated portion of the right lower lobe. Multiple surgical clips noted in the right hilar region. The left lung is clear. There is no pneumothorax. The right cardiac border is silhouetted by the right lung opacities. Median sternotomy wires and mechanical aortic valve as well as pulmonic valve noted. Osteopenia. No acute fracture. IMPRESSION: 1. Opacification of the right lung base and right apical region with only partially aerated right mid lung field. The right lung base opacities represent combination of pleural effusion, lung mass and consolidative change. Right apical opacity represents combination of loculated pleural effusion and consolidative changes of the lungs. Overall the appearance is similar to the CT of 05/16/2016. 2. Clear left lung. Electronically Signed   By: Anner Crete M.D.   On: 05/21/2016 03:33     ASSESSMENT and PLAN Acute respiratory failure ([place]) Suspect due to post-obstructive MSSA PNA in setting malignancy. ? Contribution progression R effusion.   PLAN Attempt to come off BiPAP this am, change to prn.  Pt and daughter would want intubation if she were to decompensate R effusion loculated on CT chest, unclear that thora would be beneficial.  Broad abx, narrow 3/19  Shock circulatory (HCC) Due to sepsis, resolved.   Plan Follow volume status, off pressors.  Abx as below  Severe sepsis (HCC) Due to MSSA bacteremia, suspect underlying MSSA PNA due to lung CA, complicated pleural space  Plan:  Follow hemodynamics Treating underlying infxn  Acute encephalopathy Toxic / metabolic + possible component delirium  Plan Hopefully will improve as we treat underlying infxn, continue redirection Minimize mind altering meds.   Electrolyte imbalance Hypokalemia  Plan Replete 3/19 am   Cancer of lower lobe of right lung [facility]) She was recommended hospice 05/23/16 for stage3A NSCLC that progressed through 2017 and early 2018 despite chemo and immune rx. Dr  Worthy Flank notes reviewed. Pt and daughter elected for more chemotherapy.   Plan Clearly will need to address suitability for further chemo once acute issues addressed.   Anemia, chronic disease Following Hbg, stabile at this time.   Plan continue to follow intermittent CBC  Full code status Note discussions between Dr Julien Nordmann and pt, and since admission for this acute illness. Pt desires aggressive care, would want intubation, even brief CPR to prolong life  Plan Will continue to address code status and GOC with pt and daughter.   Type 2 diabetes mellitus (HCC) CBG's and SSI as ordered  CAD (coronary artery disease) Hx CAD, troponin negative this admit  Plan Holding statin for now Continue ASA  A-fib ([place]) A  Fib, rate controlled  Recent Labs Lab 05/30/2016 0255 06/17/2016 0923 06/06/16 0400  INR 2.91 2.81 3.49   Plan  coumadin on hold, INR rising Heparin when INR < 2.0 Follow INR, consider Vit k if still rising on 3/20  Hypothyroidism Start synthroid 3/19   Aortic valve replaced Hx porcine AVR and MVR  Plan:  TTE ordered, as above at risk endocarditis  COPD (chronic obstructive pulmonary disease) ([place]) Hx COPD  Plan BD as ordered  Goals of care, counseling/discussion Per Dr Golden Pop discussions: Full code as stated to ER by daughter 06/09/2016 and on 05/23/16 to oncology  Plan:  Will need to continue to discuss [place] as we go forward, based on recovery or lack of improvement  Osteopenia Hold fosamax  RLS (restless legs syndrome) Holding ropinrole for now  Hypertensive heart disease with CHF (congestive heart failure) (HCC) Hx hypertensive diastolic CHF, A Fib. Hemodynamically stable without evidence volume overload currently  Plan Careful BP control, volume management.    Staphylococcus aureus bacteremia Noted on BCID screen. Hx of porcine aortic valve replacement, porcine MV repair, at high risk for endocarditis.   Plan:  Narrow abx 3/19 to  ancef Will need TTE to assess valves once otherwise stabilized.  Appreciate ID assistance   Portal Vein gas, ? Evidence bowel ischemia Plan:  Follow abd exam, lactate, LFT  FAMILY  - Updates: No family at bedside 3/19.   - Inter-disciplinary family meet or Palliative Care meeting due by:  05/20/16  CODE STATUS Full Code  DISPO   Independent CC time 47 minutes  Baltazar Apo, MD, PhD 06/06/2016, 9:46 AM [place] [facility] [phone] or if no answer [phone]

## 2016-06-06 NOTE — Progress Notes (Addendum)
Pharmacy Antibiotic Note  Marissa [name] is a 83 y.o. female admitted on 06/14/2016 with MSSA bacteremia.  Pharmacy has been consulted for Ancef dosing.  Plan: Ancef 2g IV every 12 hours.  Monitor renal function, culture results, and clinical status.  Follow-up for repeat blood cultures as appropriate to determine clearance. Follow-up ECHO  Height: '5\' 3"'$  (160 cm) Weight: 122 lb 5.7 oz (55.5 kg) IBW/kg (Calculated) : 52.4  Temp (24hrs), Avg:97.9 F (36.6 C), Min:96.7 F (35.9 C), Max:100.3 F (37.9 C)   Recent Labs Lab 05/24/2016 0255 06/09/2016 0314 06/11/2016 0637 06/04/2016 0923 06/16/2016 1320 05/22/2016 2000 06/08/2016 2300 06/06/16 0400  WBC 26.5*  --   --  27.8* 28.0* 20.1* 18.6* 16.4*  CREATININE 0.99  --   --  1.18*  --   --   --  1.36*  LATICACIDVEN  --  2.82* 1.50  --   --   --  0.9  --     Estimated Creatinine Clearance: 26.8 mL/min (A) (by C-G formula based on SCr of 1.36 mg/dL (H)).    Allergies  Allergen Reactions  . Prozac [Fluoxetine Hcl] Rash    Antimicrobials this admission: Vanc 3/18>>3/19 Zosyn 3/18>>3/19 Ancef 3/19 >>  Dose adjustments this admission:  n/a  Microbiology results: 3/17 BCx - 2/2 GPC clusters 3/17 BCID - MSSA 3/18 MRSA pcr negative 3/18 Resp panel negative  Thank you for allowing pharmacy to be a part of this patient's care.  Sloan Leiter, PharmD, BCPS Clinical Pharmacist Clinical phone 06/06/2016 until 3:30 PM - [phone] After hours, please  06/06/2016 9:13 AM

## 2016-06-06 NOTE — Progress Notes (Signed)
ANTICOAGULATION CONSULT NOTE - Follow Up Consult  Pharmacy Consult for Heparin when INR <2 Indication: atrial fibrillation  Allergies  Allergen Reactions  . Prozac [Fluoxetine Hcl] Rash    Patient Measurements: Height: '5\' 3"'$  (160 cm) Weight: 122 lb 5.7 oz (55.5 kg) IBW/kg (Calculated) : 52.4 Heparin Dosing Weight: 55.5 kg  Vital Signs: Temp: 96.7 F (35.9 C) (03/19 0715) Temp Source: Axillary (03/19 0715) BP: 115/88 (03/19 1000) Pulse Rate: 98 (03/19 1000)  Labs:  Recent Labs  06/03/2016 0255 05/25/2016 0923 05/26/2016 1320 05/30/2016 2000 06/15/2016 2111 06/11/2016 2300 06/06/16 0400  HGB 8.6* 6.8* 7.1* 7.3*  --  7.4* 7.4*  HCT 30.1* 23.6* 25.3* 24.9*  --  25.4* 25.8*  PLT 287 222 249 215  --  227 210  APTT  --   --  56*  --   --   --   --   LABPROT 31.0* 30.2*  --   --   --   --  35.8*  INR 2.91 2.81  --   --   --   --  3.49  CREATININE 0.99 1.18*  --   --   --   --  1.36*  TROPONINI  --  0.05*  --   --  0.03*  --   --     Estimated Creatinine Clearance: 26.8 mL/min (A) (by C-G formula based on SCr of 1.36 mg/dL (H)).  Assessment: 83 year old female on chronic warfarin prior to admission for atrial fibrillation (porcine AV and MV replacements). Last reported dose was 3/17. Warfarin is now on hold and INR is trending up at 3.49 today. Patient does have noted history of hemoptysis and epistaxis in past. INR goal per anticoagulant clinic 2- 2.5.   H/H low but up from 6.8 without blood products. Plts 210-slight trend down. No bleeding reported.   Goal of Therapy:  Heparin level 0.3-0.7 units/ml Monitor platelets by anticoagulation protocol: Yes   Plan:  Hold Heparin - start when INR <2 If INR continues to trend up with warfarin on hold - consider vitamin K. No bleeding currently noted.   Sloan Leiter, PharmD, BCPS Clinical Pharmacist Clinical phone 06/06/2016 until 3:30 PM - [phone] After hours, please call [phone] 06/06/2016,11:03 AM

## 2016-06-06 NOTE — Progress Notes (Signed)
Pt placed back on bipap at 0934 due to Increased WOB, Respiratory distress. Pt spo2 dropped to 83% per RN. Pt anxious about having some water and something to eat just prior to having to go back on bipap. RN explained to pt that right now we need to focus on getting her breathing improved and one way or another she would get nutrition and not to stress over it . Pt stated she understood. Pt with improvement upon bipap mask being placed back on. Pt worried about her "markers, book, comb, and brush" at this point. RT ensured her we would locate it and to really try to relax and just focus on her breathing at this point, as we are trying to avoid intubation. RN aware. RT will continue to monitor.

## 2016-06-06 NOTE — Assessment & Plan Note (Signed)
Noted on BCID screen. Hx of porcine aortic valve replacement, porcine MV repair, at high risk for endocarditis.   Plan:  Narrow abx 3/19 to ancef Will need TTE to assess valves once otherwise stabilized.  Appreciate ID assistance

## 2016-06-07 ENCOUNTER — Inpatient Hospital Stay (HOSPITAL_COMMUNITY): Payer: Medicare Other

## 2016-06-07 DIAGNOSIS — L8990 Pressure ulcer of unspecified site, unspecified stage: Secondary | ICD-10-CM | POA: Diagnosis present

## 2016-06-07 DIAGNOSIS — R000 Tachycardia, unspecified: Secondary | ICD-10-CM

## 2016-06-07 DIAGNOSIS — J438 Other emphysema: Secondary | ICD-10-CM

## 2016-06-07 DIAGNOSIS — J9622 Acute and chronic respiratory failure with hypercapnia: Secondary | ICD-10-CM

## 2016-06-07 DIAGNOSIS — Z823 Family history of stroke: Secondary | ICD-10-CM

## 2016-06-07 DIAGNOSIS — Z8249 Family history of ischemic heart disease and other diseases of the circulatory system: Secondary | ICD-10-CM

## 2016-06-07 DIAGNOSIS — J918 Pleural effusion in other conditions classified elsewhere: Secondary | ICD-10-CM

## 2016-06-07 DIAGNOSIS — Z9989 Dependence on other enabling machines and devices: Secondary | ICD-10-CM

## 2016-06-07 LAB — GLUCOSE, CAPILLARY
GLUCOSE-CAPILLARY: 84 mg/dL (ref 65–99)
GLUCOSE-CAPILLARY: 86 mg/dL (ref 65–99)
Glucose-Capillary: 141 mg/dL — ABNORMAL HIGH (ref 65–99)

## 2016-06-07 LAB — CULTURE, BLOOD (ROUTINE X 2)

## 2016-06-07 LAB — CBC
HEMATOCRIT: 24.5 % — AB (ref 36.0–46.0)
HEMOGLOBIN: 7.1 g/dL — AB (ref 12.0–15.0)
MCH: 20.5 pg — ABNORMAL LOW (ref 26.0–34.0)
MCHC: 29 g/dL — AB (ref 30.0–36.0)
MCV: 70.6 fL — ABNORMAL LOW (ref 78.0–100.0)
Platelets: 181 10*3/uL (ref 150–400)
RBC: 3.47 MIL/uL — ABNORMAL LOW (ref 3.87–5.11)
RDW: 17.8 % — AB (ref 11.5–15.5)
WBC: 9.5 10*3/uL (ref 4.0–10.5)

## 2016-06-07 LAB — BASIC METABOLIC PANEL
ANION GAP: 11 (ref 5–15)
BUN: 16 mg/dL (ref 6–20)
CHLORIDE: 103 mmol/L (ref 101–111)
CO2: 25 mmol/L (ref 22–32)
Calcium: 8.2 mg/dL — ABNORMAL LOW (ref 8.9–10.3)
Creatinine, Ser: 1.21 mg/dL — ABNORMAL HIGH (ref 0.44–1.00)
GFR calc non Af Amer: 41 mL/min — ABNORMAL LOW (ref >60)
GFR, EST AFRICAN AMERICAN: 47 mL/min — AB (ref >60)
Glucose, Bld: 83 mg/dL (ref 65–99)
POTASSIUM: 3.5 mmol/L (ref 3.5–5.1)
SODIUM: 139 mmol/L (ref 135–145)

## 2016-06-07 LAB — PROTIME-INR
INR: 4.87 — AB
Prothrombin Time: 47.3 seconds — ABNORMAL HIGH (ref 11.4–15.2)

## 2016-06-07 LAB — MAGNESIUM: MAGNESIUM: 2.2 mg/dL (ref 1.7–2.4)

## 2016-06-07 MED ORDER — MORPHINE SULFATE (PF) 2 MG/ML IV SOLN
INTRAVENOUS | Status: AC
  Filled 2016-06-07: qty 1

## 2016-06-07 MED ORDER — MORPHINE SULFATE (PF) 2 MG/ML IV SOLN
2.0000 mg | INTRAVENOUS | Status: DC | PRN
  Administered 2016-06-07: 2 mg via INTRAVENOUS

## 2016-06-07 MED ORDER — HALOPERIDOL LACTATE 5 MG/ML IJ SOLN: 0.5000 mg | Freq: Four times a day (QID) | INTRAMUSCULAR | Status: DC | PRN

## 2016-06-07 MED ORDER — LORAZEPAM 2 MG/ML IJ SOLN
INTRAMUSCULAR | Status: AC
  Filled 2016-06-07: qty 1

## 2016-06-07 MED ORDER — MIRTAZAPINE 15 MG PO TABS: 15.0000 mg | ORAL_TABLET | Freq: Every day | ORAL | Status: DC

## 2016-06-07 MED ORDER — LORAZEPAM 2 MG/ML IJ SOLN
1.0000 mg | INTRAMUSCULAR | Status: AC
  Administered 2016-06-07: 1 mg via INTRAVENOUS

## 2016-06-07 MED ORDER — VITAMIN K1 10 MG/ML IJ SOLN
5.0000 mg | Freq: Once | INTRAVENOUS | Status: AC
  Administered 2016-06-07: 5 mg via INTRAVENOUS
  Filled 2016-06-07: qty 0.5

## 2016-06-07 MED ORDER — SODIUM CHLORIDE 0.9 % IV SOLN: Freq: Once | INTRAVENOUS | Status: DC

## 2016-06-07 MED ORDER — ALPRAZOLAM 0.25 MG PO TABS
0.2500 mg | ORAL_TABLET | Freq: Three times a day (TID) | ORAL | Status: DC
  Administered 2016-06-07: 0.25 mg via ORAL
  Filled 2016-06-07: qty 1

## 2016-06-07 MED ORDER — MIRTAZAPINE 15 MG PO TABS
15.0000 mg | ORAL_TABLET | Freq: Once | ORAL | Status: AC
  Administered 2016-06-07: 15 mg via ORAL
  Filled 2016-06-07: qty 1

## 2016-06-07 MED ORDER — MORPHINE BOLUS VIA INFUSION
2.0000 mg | INTRAVENOUS | Status: DC | PRN
  Filled 2016-06-07: qty 2

## 2016-06-07 MED ORDER — MORPHINE BOLUS VIA INFUSION
5.0000 mg | INTRAVENOUS | Status: DC | PRN
  Filled 2016-06-07: qty 5

## 2016-06-07 MED ORDER — MORPHINE BOLUS VIA INFUSION
2.0000 mg | INTRAVENOUS | Status: AC
  Administered 2016-06-07: 2 mg via INTRAVENOUS
  Filled 2016-06-07: qty 2

## 2016-06-07 MED ORDER — LORAZEPAM 2 MG/ML IJ SOLN: 2.0000 mg | INTRAMUSCULAR | Status: DC | PRN

## 2016-06-07 MED ORDER — SODIUM CHLORIDE 0.9 % IV SOLN
5.0000 mg/h | INTRAVENOUS | Status: DC
  Administered 2016-06-07 (×3): 5 mg/h via INTRAVENOUS
  Filled 2016-06-07: qty 10

## 2016-06-07 MED ORDER — MORPHINE SULFATE (PF) 2 MG/ML IV SOLN
2.0000 mg | INTRAVENOUS | Status: DC | PRN
  Administered 2016-06-07: 2 mg via INTRAVENOUS
  Filled 2016-06-07: qty 1

## 2016-06-07 MED ORDER — MORPHINE SULFATE (CONCENTRATE) 10 MG/0.5ML PO SOLN
5.0000 mg | ORAL | Status: DC | PRN
  Administered 2016-06-07: 5 mg via SUBLINGUAL
  Filled 2016-06-07: qty 0.5

## 2016-06-08 LAB — BPAM FFP
BLOOD PRODUCT EXPIRATION DATE: 201803232359
BLOOD PRODUCT EXPIRATION DATE: 201803232359
Blood Product Expiration Date: 201803252359
ISSUE DATE / TIME: 201803200749
ISSUE DATE / TIME: 201803200951
UNIT TYPE AND RH: 6200
Unit Type and Rh: 6200
Unit Type and Rh: 6200

## 2016-06-08 LAB — PREPARE FRESH FROZEN PLASMA
UNIT DIVISION: 0
UNIT DIVISION: 0
Unit division: 0

## 2016-06-11 LAB — CULTURE, BLOOD (ROUTINE X 2)
Culture: NO GROWTH
Culture: NO GROWTH

## 2016-06-13 ENCOUNTER — Other Ambulatory Visit: Payer: Self-pay

## 2016-06-13 ENCOUNTER — Ambulatory Visit: Payer: Self-pay

## 2016-06-16 ENCOUNTER — Telehealth: Payer: Self-pay

## 2016-06-16 NOTE — Telephone Encounter (Signed)
On 06/16/16 I received a death certificate from [facility] (original). The death certificate is for cremation. The patient is a patient of Doctor Byrum. The death certificate will be taken to Pulmonary Unit @ [facility] this pm for signature.  On 17-Jul-2016 I received the death certificate back from Doctor Byrum. I got the death certificate ready and called the funeral home to let them know that Blanch Media with [employer] will be by to pickup the d/c. I also faxed a copy to the funeral home per the funeral home request.

## 2016-06-19 NOTE — Progress Notes (Signed)
ANTICOAGULATION CONSULT NOTE - Follow Up Consult  Pharmacy Consult for Heparin when INR <2 Indication: atrial fibrillation  Allergies  Allergen Reactions  . Prozac [Fluoxetine Hcl] Rash    Patient Measurements: Height: '5\' 3"'$  (160 cm) Weight: 126 lb 5.2 oz (57.3 kg) IBW/kg (Calculated) : 52.4 Heparin Dosing Weight: 55.5 kg  Vital Signs: Temp: 97.7 F (36.5 C) (03/20 0815) Temp Source: Axillary (03/20 0800) BP: 120/57 (03/20 0706) Pulse Rate: 107 (03/20 0706)  Labs:  Recent Labs  06/13/2016 0923 05/31/2016 1320  06/10/2016 2111  06/06/16 0400 06/06/16 1300 20-Jun-2016 0450  HGB 6.8* 7.1*  < >  --   < > 7.4* 7.1* 7.1*  HCT 23.6* 25.3*  < >  --   < > 25.8* 25.2* 24.5*  PLT 222 249  < >  --   < > 210 184 181  APTT  --  56*  --   --   --   --   --   --   LABPROT 30.2*  --   --   --   --  35.8*  --  47.3*  INR 2.81  --   --   --   --  3.49  --  4.87*  CREATININE 1.18*  --   --   --   --  1.36*  --  1.21*  TROPONINI 0.05*  --   --  0.03*  --   --   --   --   < > = values in this interval not displayed.  Estimated Creatinine Clearance: 30.2 mL/min (A) (by C-G formula based on SCr of 1.21 mg/dL (H)).  Assessment: 83 year old female on warfarin prior to admission for atrial fibrillation (porcine AV and MV replacements). Last reported dose was 3/17. Warfarin is now on hold and INR is trending up at 4.87 today. Patient does have noted history of hemoptysis and epistaxis in past. INR goal per anticoagulant clinic 2- 2.5. Hgb 7.1, plts wnl.  Goal of Therapy:  Heparin level 0.3-0.7 units/ml Monitor platelets by anticoagulation protocol: Yes   Plan:  Hold Heparin - start when INR <2 If INR continues to trend up with warfarin on hold - consider vitamin K Currently no bleeding, receiving FFP    Hughes Better, PharmD, BCPS Clinical Pharmacist 06-20-2016 8:29 AM

## 2016-06-19 NOTE — Progress Notes (Signed)
CRITICAL VALUE ALERT  Critical value received:  INR 4.87  Date of notification:  2016-06-24  Time of notification:  0600  Critical value read back: Yes  Nurse who received alert:  Rito Ehrlich, RN [phone]  MD notified (1st page):  [phone]  Time of first page:  0623  MD notified (2nd page):  Time of second page:  Responding MD:  Warren Lacy  Time MD responded:  [phone]

## 2016-06-19 NOTE — Consult Note (Signed)
Met with patient and her daughter. Goals are comfort and QOL. Will stop bipap and see what her medication requirements are for comfort prior to moving her to the floor- if she survives out of the hospital I discussed hospice facility for her care.    Full consult note to follow.  Lane Hacker, DO Palliative Medicine

## 2016-06-19 NOTE — Progress Notes (Signed)
Palliative Medicine RN Note: Spoke with [name]; she will use [facility] for her mother's care. Notified ICU.  Marjie Skiff Yovanni Frenette, RN, BSN, [facility] 2016-07-03 3:52 PM Cell [phone] 8:00-4:00 Monday-Friday Office [phone]

## 2016-06-19 NOTE — Progress Notes (Addendum)
[facility] Progress Note Patient Name: Marissa [name] DOB: 1933/03/23 MRN: [id number]   Date of Service  06-09-16  HPI/Events of Note  Coagulopathy - INR = 4.87. Patient was on Warfarin at home.   eICU Interventions  Will order: 1. Transfuse 3 units FFP. 2. Repeat PT/INR at 11 AM.     Intervention Category Intermediate Interventions: Coagulopathy - evaluation and management  Sommer,Steven Eugene 2016-06-09, 6:35 AM

## 2016-06-19 NOTE — Progress Notes (Signed)
Palliative Medicine RN Note: Came to see pt; at 1518 noted lack of respirations and pulse. ICU RN at bedside. ECG not showing asystole yet. Will attempt to call daughter again.  Marjie Skiff Recie Cirrincione, RN, BSN, [facility] June 08, 2016 3:25 PM Cell [phone] 8:00-4:00 Monday-Friday Office [phone]

## 2016-06-19 NOTE — Progress Notes (Signed)
PULMONARY / CRITICAL CARE MEDICINE   Name: Marissa [name] MRN: [id number] DOB: 1934-02-24    ADMISSION DATE:  05/20/2016 CONSULTATION DATE:  05/30/2016   REFERRING MD:  Dr Vilinda Blanks of ER  CHIEF COMPLAINT:  resp failure and septic shock in 83year old with lung cancer, full code  HISTORY OF PRESENT ILLNESS:   83 year old cachectic female with diast chf hx, a history of lung cancer followed by Dr. Julien Nordmann for stage IIIa T2, N2, M0 squamous cell carcinoma with recurrence in the right upper lobe as well as mediastinal adenopathy diagnosed in September 2016. She status post conventional platinum and Taxol therapy and also immunotherapy with Nivolumab status post 5 cycles ending December 2017 and discontinued due to disease progression. Last seen Dr. Julien Nordmann on 05/23/2016 and hospice care recommended but patient and daughter opted for single agent gemcitabine therapy to be started tomorrow  06/06/2016  She presented in 06/08/2016 to the emergency department with worsening dyspnea over the last 24 hours and worsening hypoxemia despite using her usual 2 L of oxygen and fever found to have 105 in the emergency room. In the emergency department ended up hypotensive requiring vasopressors for central line and needing BiPAP therapy for worsening respiratory distress. Chest x-ray showed increase right-sided lung cancer findings of opacitivcation and possible pelural effuson. Pulmonary critical care call for admission  Events 05/27/2016 -admit, bipap and Left IJ and neo  Interval / Subjective:  Remains on biPAP, did not tolerate removal for more than 15 minutes yesterday am due to dyspnea.     VITAL SIGNS: BP (!) 120/57   Pulse (!) 107   Temp 98.1 F (36.7 C) (Axillary)   Resp (!) 37   Ht '5\' 3"'$  (1.6 m)   Wt 57.3 kg (126 lb 5.2 oz)   SpO2 94%   BMI 22.38 kg/m   HEMODYNAMICS:    VENTILATOR SETTINGS: FiO2 (%):  [30 %-40 %] 40 %  INTAKE / OUTPUT: I/O last 3 completed shifts: In: 3482.4  [I.V.:2792.5; IV Piggyback:689.9] Out: 710 [Urine:710]  EXAM Vitals:   06/18/2016 0630 18-Jun-2016 0700 06-18-2016 0704 06/18/2016 0706  BP: 123/74 (!) 120/57  (!) 120/57  Pulse: (!) 108 (!) 110  (!) 107  Resp: (!) 44 (!) 23  (!) 37  Temp:      TempSrc:      SpO2: 94% 95% 95% 94%  Weight:      Height:       Gen: Ill woman, on BiPAP  ENT: OP clear, some erythema where BiPAp contacts face  Neck: No JVD or stridor  Lungs: coarse bilaterally.   Cardiovascular: irregular, tachy, 3/6 M  Abdomen: soft, + BS  Musculoskeletal No deformities  Neuro: awake and appropriate, follows commands and answers questions.   Skin: no rash   LABS  PULMONARY  Recent Labs Lab 06/03/2016 0444 06/06/16 0236  PHART 7.386 7.352  PCO2ART 59.2* 49.0*  PO2ART 137.0* 41.0*  HCO3 34.3* 27.3  TCO2 36 29  O2SAT 98.0 75.0    CBC  Recent Labs Lab 06/06/16 0400 06/06/16 1300 Jun 18, 2016 0450  HGB 7.4* 7.1* 7.1*  HCT 25.8* 25.2* 24.5*  WBC 16.4* 11.7* 9.5  PLT 210 184 181    COAGULATION  Recent Labs Lab 06/01/2016 0255 06/04/2016 0923 06/06/16 0400 06-18-2016 0450  INR 2.91 2.81 3.49 4.87*    CARDIAC    Recent Labs Lab 06/01/2016 0923 05/27/2016 2111  TROPONINI 0.05* 0.03*   No results for input(s): PROBNP in the last 168 hours.  CHEMISTRY  Recent Labs Lab 06/04/2016 0255 06/09/2016 0923 06/06/16 0400 July 01, 2016 0450  NA 134* 132* 138 139  K 3.7 3.2* 3.4* 3.5  CL 91* 93* 101 103  CO2 34* '29 26 25  '$ GLUCOSE 98 124* 51* 83  BUN 14 15 21* 16  CREATININE 0.99 1.18* 1.36* 1.21*  CALCIUM 8.9 7.5* 7.7* 8.2*  MG  --  1.3* 2.6* 2.2  PHOS  --  3.6 4.1  --    Estimated Creatinine Clearance: 30.2 mL/min (A) (by C-G formula based on SCr of 1.21 mg/dL (H)).   LIVER  Recent Labs Lab 06/12/2016 0255 06/04/2016 0923 06/06/16 0400 2016/07/01 0450  AST 27 24  --   --   ALT 14 12*  --   --   ALKPHOS 137* 111  --   --   BILITOT 1.0 0.8  --   --   PROT 6.7 5.2*  --   --   ALBUMIN 2.9* 2.2*  --    --   INR 2.91 2.81 3.49 4.87*     INFECTIOUS  Recent Labs Lab 06/16/2016 0314 06/06/2016 0637 05/27/2016 0923 06/12/2016 2300  LATICACIDVEN 2.82* 1.50  --  0.9  PROCALCITON  --   --  24.46  --      ENDOCRINE CBG (last 3)   Recent Labs  06/06/16 1928 06/06/16 2310 2016/07/01 0341  GLUCAP 72 75 84     IMAGING x48h  - image(s) personally visualized  -   highlighted in bold Ct Chest Wo Contrast  Result Date: 05/24/2016 CLINICAL DATA:  Respiratory distress and fever. History of lung cancer. EXAM: CT CHEST WITHOUT CONTRAST TECHNIQUE: Multidetector CT imaging of the chest was performed following the standard protocol without IV contrast. COMPARISON:  Portable chest obtained earlier today. Chest CT dated 05/16/2016. FINDINGS: Cardiovascular: Dense coronary artery and aortic calcifications. Mildly enlarged heart with stable marked left atrial enlargement. Mediastinum/Nodes: The previously demonstrated 2.7 cm short axis right supraclavicular lymph node has a short axis diameter of 3.5 cm today on image number 19 of series 201. The previously demonstrated 1.4 cm short axis subcarinal node is not as well delineated today without intravenous contrast, with an approximate short axis diameter of 1.4 cm on image number 45 of series 201. Lungs/Pleura: Left lower lobe atelectasis with little change. Stable right upper hemithorax postradiation changes and consolidation. Stable moderate size right pleural effusion with loculations. The previously demonstrated 5.6 x 4.8 cm necrotic mass along the anterior aspect of the right middle lobe is not as well delineated without intravenous contrast today. This mass measures approximately 6.9 x 5.5 cm on image number 48 of series 201 with mildly progressive extension through the chest wall anteriorly. Upper Abdomen: Interval linear branching air in the periphery of the liver anteriorly. Cholecystectomy clips. Interval small amount of free peritoneal fluid adjacent to the  spleen. There is probable partially imaged hepatic flexure in the region of the gallbladder fossa. Musculoskeletal: Progressive destruction of the anterolateral aspect of the right third rib, encased by the tumor extending through the chest wall. Mildly displaced right lateral third rib fracture with bridging callus and well visualized fracture line. IMPRESSION: 1. Interval linear branching area in the periphery of the liver anteriorly. This is concerning for portal venous gas. This can be seen with intestinal necrosis. If the patient has corresponding abdominal symptoms, an abdomen and pelvis CT with contrast would be recommended. 2. Increased size of the known right middle lobe malignancy with chest wall invasion. This currently  measures 6.9 x 5.5 cm in the axial plane. 3. Progressive destruction of the involved right third rib as well as a subacute right third rib fracture. 4. Progressive right supraclavicular metastatic adenopathy with stable mild subcarinal metastatic adenopathy. 5. Stable moderate-sized right pleural effusion with loculation. 6. Stable left lower lobe atelectasis. 7. Extensive coronary artery atherosclerosis and aortic atherosclerosis. Critical Value/emergent results were called by telephone at the time of interpretation on 06/18/2016 at 11:06 am to Dr. Brand Males , who verbally acknowledged these results. Electronically Signed   By: Claudie Revering M.D.   On: 06/08/2016 11:11   Dg Chest Port 1 View  Result Date: 2016/06/12 CLINICAL DATA:  Acute respiratory failure. EXAM: PORTABLE CHEST 1 VIEW COMPARISON:  Radiograph and CT scan of June 05, 2016. FINDINGS: There is nearly complete opacification of the right hemithorax consistent with diffuse pneumonia or atelectasis or pleural effusion. Status post cardiac valve repair. Atherosclerosis of thoracic aorta is noted. No pneumothorax is noted. Left perihilar and basilar opacity is noted concerning for atelectasis or infiltrate. Postsurgical  changes are seen in the right shoulder. IMPRESSION: Nearly complete opacification the right hemithorax consistent with diffuse pneumonia or atelectasis with possible associated pleural effusion. Increased left perihilar and basilar opacity is noted concerning for atelectasis or infiltrate. Electronically Signed   By: Marijo Conception, M.D.   On: 06/12/2016 07:06    DISCUSSION: 83 yo woman, hx COPD, AVR + MVR, A fib, HTN, CAD, NSCLCA Stage IV progressing following chemo, immunotherapy. Admitted with septic shock and renal failure  ASSESSMENT / PLAN:  PULMONARY A: Acute respiratory failure (HCC) Suspect due to post-obstructive MSSA PNA in setting malignancy. ? Contribution progression R effusion.  Cancer of lower lobe of right lung [name]) She was recommended hospice 05/23/16 for stage3A NSCLC that progressed through 2017 and early 2018 despite chemo and immune rx. Dr Worthy Flank notes reviewed. Pt and daughter elected for more chemotherapy Complicated R pleural space, possible malignant effusion COPD P:   Attempt to come off BiPAP this am if she can tolerate, goal change to prn although unable on 3/19.  Patient and daughter have expressed desire for intubation, although suspect that this will not result in good outcome. Need to discuss further with them both.  R effusion loculated on CT chest, unclear that thora would be beneficial.  Abx narrowed on 3/19 as below.   CARDIOVASCULAR A:  Shock, septic (MSSA PNA / bacteremia) Hemodynamically improved Hx CAD Atrial Fib Hx Porcine AVR,  Porcine MVR > high risk for endocarditis Hx HTN with chronic dCHF P:  Continue IVF support Pressors off   RENAL A:   Acute renal failure,  Plateau S Cr 1.36 Electrolyte imbalance Hypokalemia P:   Follow BMP and UOP   GASTROINTESTINAL A:   Portal venous gas on CT abdomen, ? Significance as no clinical evidence bowel ischemia SUP Nutrition P:   Follow clinically, would defer any further abd imaging  for now.  Protonix ordered  HEMATOLOGIC A:   Anemia of chronic disease P:  Follow CBC Goal Hgb > 7.0  INFECTIOUS A:   MSSA bacteremia, at high risk endocarditis P:   Changed to Ancef 3/18  ENDOCRINE A:   Hypoglycemia Type 2 DM Hypothyroidism Osteopenia P:   SSI protocol Currently running D5 0.45NS as she is NPO w BiPAP in place  NEUROLOGIC A:   Acute encephalopathy Toxic / metabolic + possible component delirium, improved 3/20 RLS P:   RASS goal: 0 Hopefully will improve as we  treat underlying infxn, continue redirection Minimize mind altering meds.   FAMILY  - Updates: will attempt to contact daughter 3/20  - Inter-disciplinary family meet or Palliative Care meeting due by:  05/20/16  CODE STATUS Full Code  DISPO ICU  Independent CC time 27 minutes  Baltazar Apo, MD, PhD 06/30/2016, 7:49 AM [place] [phone] or if no answer [phone]

## 2016-06-19 NOTE — Progress Notes (Signed)
Palliative Medicine RN Note: attempted call to daughter again. Had to leave message with return phone number.  Marjie Skiff Hemi Chacko, RN, BSN, [facility] 07/05/2016 1:37 PM Cell [phone] 8:00-4:00 Monday-Friday Office [phone]

## 2016-06-19 NOTE — Discharge Summary (Signed)
PULMONARY / CRITICAL CARE MEDICINE   Name: Marissa [name] MRN: [id number] DOB: 1933/08/01    ADMISSION DATE:  2016/06/09 CONSULTATION DATE:  June 09, 2016  Date of death: 2016/06/11   Final cause of death Septic shock  Secondary causes of death MSSA pneumonia MSSA bacteremia Acute respiratory failure with hypoxia Toxic metabolic encephalopathy Non-small cell lung cancer, stage IIIa Complicated R pleural space, possible malignant pleural effusion COPD History of porcine aortic valve replacement, history of porcine mitral valve replacement Hypertension with chronic diastolic CHF Atrial fibrillation History of coronary artery disease Acute renal failure Hypokalemia Possible transient bowel ischemia, unproven Anemia of chronic disease Protein calorie malnutrition Type 2 diabetes mellitus Hypoglycemia Hypothyroidism Osteopenia  HISTORY OF PRESENT ILLNESS / hospital course:   83 year old cachectic female with diast chf hx, a history of lung cancer followed by Dr. Julien Nordmann for stage IIIa T2, N2, M0 squamous cell carcinoma with recurrence in the right upper lobe as well as mediastinal adenopathy diagnosed in September 2016. She status post conventional platinum and Taxol therapy and also immunotherapy with Nivolumab status post 5 cycles ending December 2017 and discontinued due to disease progression. Last seen Dr. Julien Nordmann on 05/23/2016 and hospice care recommended but patient and daughter opted for single agent gemcitabine.  She presented in 06/09/16 to the emergency department with worsening dyspnea over 24 hours and worsening hypoxemia despite using her usual 2 L of oxygen and fever found to have 105 in the emergency room. In the emergency department ended up hypotensive requiring vasopressors for central line and needing BiPAP therapy for worsening respiratory distress. Chest x-ray showed increase right-sided lung cancer findings of opacitivcation and possible pelural effusion. She  was treated with BiPAP to stabilize her evolving acute respiratory failure. Started on phenylephrine for blood pressure support. She was treated with broad-spectrum antibiotics. Blood cultures were positive for methicillin sensitive staph aureus so antibiotics were narrowed to cefazolin. Given her underlying malignancy and her failure to respond to initial therapies palliative care discussions were undertaken. The patient was unable to be liberated from BiPAP. Decision was made to transition her to comfort-based approach. This was done on 11-Jun-2016. She expired later on that same day.  Baltazar Apo, MD, PhD 06/16/2016, 8:10 AM [facility] [phone] or if no answer [phone]

## 2016-06-19 NOTE — Progress Notes (Signed)
Palliative Medicine RN Note: Rec'd call from RN that pt is in distress despite giving all available comfort meds. Called Dr Hilma Favors, who ordered morphine drip. PMT RN came to eval pt; she is tachypnic, tachycardic, staring straight ahead. [place] pulling blood with every cough; looks like roof of mouth is oozing. Called Dr Hilma Favors again; morphine gtt not available yet, so RN cannot access morphine bolus through infusion, and the injection version has been stopped; she gave orders for morphine stat with ativan stat. PMT RN will stay at bedside until pt is comfortable.   Attempted to call daughter at both numbers; no voicemail, no answer.  Marjie Skiff Dequane Strahan, RN, BSN, [facility] 23-Jun-2016 1:24 PM Cell [phone] 8:00-4:00 Monday-Friday Office [phone]

## 2016-06-19 NOTE — Progress Notes (Signed)
   Jun 16, 2016 1120  Clinical Encounter Type  Visited With Patient and family together  Visit Type Spiritual support  Referral From Nurse  Spiritual Encounters  Spiritual Needs Emotional  Stress Factors  Patient Stress Factors Health changes  Family Stress Factors Family relationships  Nurse requested check in with Pt and daughter. Introduction to Pt and daughter. Pt reports she is "good with the Lord". Offered a blessing.

## 2016-06-19 NOTE — Progress Notes (Signed)
[facility] Infectious Disease  Date of Admission:  05/29/2016   Total days of antibiotics 3        Day 2 Cefazolin         Principal Problem:   MSSA Bacteremia Active Problems:   CAD (coronary artery disease)   Primary cancer of right upper lobe of lung (HCC)   Acute on chronic respiratory failure with hypoxia and hypercapnia (HCC)   Hypertensive heart disease with CHF (congestive heart failure) (HCC)   A-fib (HCC)   COPD (chronic obstructive pulmonary disease) (HCC)   Aortic valve replaced   History of mitral valve replacement   Recurrent right pleural effusion   Hypercholesteremia   RLS (restless legs syndrome)   Osteopenia   Hypothyroidism   Full code status   Type 2 diabetes mellitus (HCC)   Anemia, chronic disease   Pressure injury of skin   . ALPRAZolam  0.25 mg Oral TID  .  ceFAZolin (ANCEF) IV  2 g Intravenous Q12H  . chlorhexidine  15 mL Mouth Rinse BID  . ipratropium-albuterol  3 mL Nebulization Q6H  . mouth rinse  15 mL Mouth Rinse q12n4p  . mirtazapine  15 mg Oral QHS  . pantoprazole (PROTONIX) IV  40 mg Intravenous QHS  . phytonadione (VITAMIN K) IV  5 mg Intravenous Once    SUBJECTIVE: Patient states she feels better. She is hungry and wants to eat ice cream. She denies fever, chills, nausea, pain.  Review of Systems: ROS  General: Denies fever, chills  Respiratory: Admits to SOB, Denies cough Gastrointestinal: Denies nausea, vomiting   Past Medical History:  Diagnosis Date  . A-fib ([place])   . Aortic stenosis    (s/p AVR w/ 21 mm porcine valve 03/2011  . Aortic valve disorders   . Arthritis   . CAD (coronary artery disease)    s/p CABG 03/2011  . CHF (congestive heart failure) ([place])   . COPD (chronic obstructive pulmonary disease) ([place])   . Dehydration 02/22/2016  . Diabetes mellitus without complication ([place])   . Dysrhythmia   . Encounter for antineoplastic chemotherapy 05/23/2016  . Encounter for antineoplastic immunotherapy  12/21/2015  . Full code status 12/22/2014  . Full code status 12/22/2014  . GERD (gastroesophageal reflux disease)    takes nexium prn  . Goals of care, counseling/discussion 05/23/2016  . History of blood transfusion    Rheumatic Fever  . HTN (hypertension)   . Hypercholesteremia   . Hypothyroidism   . Lung cancer ([place])   . Mastoiditis   . Mitral valve disease    s/p MVR 03/2011 w/ 27 mm porcine valve  . Myocardial infarction    "slight"  . Osteopenia   . Pneumonia   . Pre-diabetes   . Recurrent right pleural effusion 03/08/2016  . RLS (restless legs syndrome)   . Shortness of breath    with exertion  . Weight loss 02/22/2016    Social History  Substance Use Topics  . Smoking status: Former Smoker    Packs/day: 0.50    Years: 30.00    Types: Cigarettes  . Smokeless tobacco: Never Used  . Alcohol use No    Family History  Problem Relation Age of Onset  . Adopted: Yes  . Cancer Brother     lung  . Heart attack Neg Hx   . Stroke Neg Hx    Allergies  Allergen Reactions  . Prozac [Fluoxetine Hcl] Rash    OBJECTIVE: Physical  Exam  Vitals:   07/06/2016 1015 07/06/2016 1030 06-Jul-2016 1045 07/06/2016 1100  BP: (!) 152/74 139/77 135/78 (!) 148/68  Pulse: (!) 109 (!) 116 (!) 119 (!) 121  Resp: (!) 33 (!) 32 (!) 29 (!) 35  Temp: 98 F (36.7 C) 100 F (37.8 C)    TempSrc: Axillary Axillary    SpO2: 100% 99% 98% 99%  Weight:      Height:       General: Vital signs reviewed.  Patient is chronically ill appearing, in no mild distress and cooperative with exam.  Cardiovascular: Tachycardic, Irregular, 3/6 SEM with click Pulmonary/Chest: Decreased breath sounds in R lung, tachypneic, no wheezing.  Abdominal: Soft, non-tender, non-distended, BS + Extremities: No lower extremity edema bilaterally  Lab Results Lab Results  Component Value Date   WBC 9.5 07/06/16   HGB 7.1 (L) July 06, 2016   HCT 24.5 (L) 07-06-2016   MCV 70.6 (L) 07/06/16   PLT 181 07/06/16    Lab  Results  Component Value Date   CREATININE 1.21 (H) 07-06-2016   BUN 16 2016-07-06   NA 139 July 06, 2016   K 3.5 07/06/2016   CL 103 2016-07-06   CO2 25 07/06/2016    Lab Results  Component Value Date   ALT 12 (L) 06/01/2016   AST 24 05/20/2016   ALKPHOS 111 05/27/2016   BILITOT 0.8 06/08/2016     Microbiology: Recent Results (from the past 240 hour(s))  Blood Culture (routine x 2)     Status: Abnormal   Collection Time: 06/10/2016  2:55 AM  Result Value Ref Range Status   Specimen Description BLOOD RIGHT ANTECUBITAL  Final   Special Requests BOTTLES DRAWN AEROBIC ONLY 5CC  Final   Culture  Setup Time   Final    GRAM POSITIVE COCCI IN CLUSTERS AEROBIC BOTTLE ONLY CRITICAL VALUE NOTED.  VALUE IS CONSISTENT WITH PREVIOUSLY REPORTED AND CALLED VALUE.    Culture (A)  Final    STAPHYLOCOCCUS AUREUS SUSCEPTIBILITIES PERFORMED ON PREVIOUS CULTURE WITHIN THE LAST 5 DAYS.    Report Status 2016-07-06 FINAL  Final  Blood Culture (routine x 2)     Status: Abnormal   Collection Time: 05/28/2016  3:06 AM  Result Value Ref Range Status   Specimen Description BLOOD LEFT ANTECUBITAL  Final   Special Requests IN PEDIATRIC BOTTLE 3CC  Final   Culture  Setup Time   Final    GRAM POSITIVE COCCI IN CLUSTERS IN PEDIATRIC BOTTLE CRITICAL RESULT CALLED TO, READ BACK BY AND VERIFIED WITH: EHassell Done, PHARM, 05/19/2016 AT 1632 BY J FUDESCO    Culture STAPHYLOCOCCUS AUREUS (A)  Final   Report Status 07-06-2016 FINAL  Final   Organism ID, Bacteria STAPHYLOCOCCUS AUREUS  Final      Susceptibility   Staphylococcus aureus - MIC*    CIPROFLOXACIN <=0.5 SENSITIVE Sensitive     ERYTHROMYCIN <=0.25 SENSITIVE Sensitive     GENTAMICIN <=0.5 SENSITIVE Sensitive     OXACILLIN <=0.25 SENSITIVE Sensitive     TETRACYCLINE <=1 SENSITIVE Sensitive     VANCOMYCIN <=0.5 SENSITIVE Sensitive     TRIMETH/SULFA <=10 SENSITIVE Sensitive     CLINDAMYCIN <=0.25 SENSITIVE Sensitive     RIFAMPIN <=0.5 SENSITIVE Sensitive      Inducible Clindamycin NEGATIVE Sensitive     * STAPHYLOCOCCUS AUREUS  Blood Culture ID Panel (Reflexed)     Status: Abnormal   Collection Time: 06/13/2016  3:06 AM  Result Value Ref Range Status   Enterococcus species NOT DETECTED NOT  DETECTED Final   Listeria monocytogenes NOT DETECTED NOT DETECTED Final   Staphylococcus species DETECTED (A) NOT DETECTED Final    Comment: CRITICAL RESULT CALLED TO, READ BACK BY AND VERIFIED WITH: E. MARTIN,PHARMD 06/16/2016 AT 1632 J FUDESCO    Staphylococcus aureus DETECTED (A) NOT DETECTED Final    Comment: Methicillin (oxacillin) susceptible Staphylococcus aureus (MSSA). Preferred therapy is anti staphylococcal beta lactam antibiotic (Cefazolin or Nafcillin), unless clinically contraindicated. CRITICAL RESULT CALLED TO, READ BACK BY AND VERIFIED WITH: E. MARTIN, PHARM, 06/13/2016 AT 1632 BY J FUDESCO    Methicillin resistance NOT DETECTED NOT DETECTED Final   Streptococcus species NOT DETECTED NOT DETECTED Final   Streptococcus agalactiae NOT DETECTED NOT DETECTED Final   Streptococcus pneumoniae NOT DETECTED NOT DETECTED Final   Streptococcus pyogenes NOT DETECTED NOT DETECTED Final   Acinetobacter baumannii NOT DETECTED NOT DETECTED Final   Enterobacteriaceae species NOT DETECTED NOT DETECTED Final   Enterobacter cloacae complex NOT DETECTED NOT DETECTED Final   Escherichia coli NOT DETECTED NOT DETECTED Final   Klebsiella oxytoca NOT DETECTED NOT DETECTED Final   Klebsiella pneumoniae NOT DETECTED NOT DETECTED Final   Proteus species NOT DETECTED NOT DETECTED Final   Serratia marcescens NOT DETECTED NOT DETECTED Final   Haemophilus influenzae NOT DETECTED NOT DETECTED Final   Neisseria meningitidis NOT DETECTED NOT DETECTED Final   Pseudomonas aeruginosa NOT DETECTED NOT DETECTED Final   Candida albicans NOT DETECTED NOT DETECTED Final   Candida glabrata NOT DETECTED NOT DETECTED Final   Candida krusei NOT DETECTED NOT DETECTED Final   Candida  parapsilosis NOT DETECTED NOT DETECTED Final   Candida tropicalis NOT DETECTED NOT DETECTED Final  Respiratory Panel by PCR     Status: None   Collection Time: 05/19/2016  9:15 AM  Result Value Ref Range Status   Adenovirus NOT DETECTED NOT DETECTED Final   Coronavirus 229E NOT DETECTED NOT DETECTED Final   Coronavirus HKU1 NOT DETECTED NOT DETECTED Final   Coronavirus NL63 NOT DETECTED NOT DETECTED Final   Coronavirus OC43 NOT DETECTED NOT DETECTED Final   Metapneumovirus NOT DETECTED NOT DETECTED Final   Rhinovirus / Enterovirus NOT DETECTED NOT DETECTED Final   Influenza A NOT DETECTED NOT DETECTED Final   Influenza B NOT DETECTED NOT DETECTED Final   Parainfluenza Virus 1 NOT DETECTED NOT DETECTED Final   Parainfluenza Virus 2 NOT DETECTED NOT DETECTED Final   Parainfluenza Virus 3 NOT DETECTED NOT DETECTED Final   Parainfluenza Virus 4 NOT DETECTED NOT DETECTED Final   Respiratory Syncytial Virus NOT DETECTED NOT DETECTED Final   Bordetella pertussis NOT DETECTED NOT DETECTED Final   Chlamydophila pneumoniae NOT DETECTED NOT DETECTED Final   Mycoplasma pneumoniae NOT DETECTED NOT DETECTED Final  MRSA PCR Screening     Status: None   Collection Time: 05/27/2016 11:26 AM  Result Value Ref Range Status   MRSA by PCR NEGATIVE NEGATIVE Final    Comment:        The GeneXpert MRSA Assay (FDA approved for NASAL specimens only), is one component of a comprehensive MRSA colonization surveillance program. It is not intended to diagnose MRSA infection nor to guide or monitor treatment for MRSA infections.      ASSESSMENT: 1. MSSA Bacteremia: Likely secondary to Post-Obstructive PNA. Patient has been Afebrile for 24 hours, leukocytosis has resolved. Blood cultures from 3/19 are pending. Patient is on day 2 of cefazolin (day 3 of antibiotics). TTE is pending. 2. Acute on chronic respiratory failure 2/2 postobstructive MSSA  pneumonia and effusion: Patient was only able to tolerate being  off of BiPAP for about 15 minutes yesterday. Per PCCM, intubation would likely not result in a good outcome. Dr. Hilma Favors with palliative care met with family today and discuss goals of care. Family is in agreement that they wish to focus on comfort. Patient was transitioned from BiPAP to nasal cannula with plans to focus on comfort at this point. A full consult note is pending.  PLAN: 1. Continue cefazolin. Follow blood cultures. Await TTE. 2. Patient is at high risk for endocarditis given her history of bioprosthetic mitral valve and aortic valve replacement and MSSA bacteremia. However, obtaining TEE would greatly increase risk of patient requiring intubation. Given that family wants to focus on comfort, would not proceed with TEE. Certainly, if family wants to pursue comfort care only measures, would discontinue antibiotics.  Martyn Malay, DO PGY-3 Internal Medicine Resident Pager # [phone] 2016/06/20 11:19 AM

## 2016-06-19 NOTE — Progress Notes (Signed)
Patient noted with no respirations, no pulse, and pupils fixed and dilated. Palliative RN at bedside, time of death confirmed at 15:27, by myself and Damita Dunnings RN. Family notified by Palliative RN. No belongings at bedside. MD notified.

## 2016-06-19 NOTE — Progress Notes (Signed)
Palliative Medicine RN Note: Reached daughter [name] at [phone]. Notified her of death. She has questions about providers for crematory services. I will get the information and call her back. She will not be coming to [facility], so pt can be sent to the morgue. Notified Claris Pong, Therapist, sports.  Marjie Skiff Amardeep Beckers, RN, BSN, [name] 07-07-16 3:35 PM Cell [phone] 8:00-4:00 Monday-Friday Office [phone]

## 2016-06-19 DEATH — deceased

## 2016-06-24 ENCOUNTER — Telehealth: Payer: Self-pay

## 2016-06-24 NOTE — Telephone Encounter (Signed)
On 06/24/2016 I received a death certificate from [facility] (revised). The patient is a patient of Doctor Byrum. The death certificate is for cremation. The death certificate will be taken to [place] Monday (07-27-2014) for signature.  On 2016/07/26 I received the death certificate back from Doctor Byrum. I got the death certificate ready and called the funeral home to let them know the death certificate is ready for pickup. I also faxed a copy to the funeral home per the funeral home request.

## 2016-06-27 ENCOUNTER — Other Ambulatory Visit: Payer: Self-pay

## 2016-06-27 ENCOUNTER — Ambulatory Visit: Payer: Self-pay | Admitting: Internal Medicine

## 2016-06-27 ENCOUNTER — Ambulatory Visit: Payer: Self-pay

## 2016-07-01 ENCOUNTER — Other Ambulatory Visit: Payer: Self-pay | Admitting: Nurse Practitioner

## 2016-07-04 ENCOUNTER — Inpatient Hospital Stay: Admission: RE | Admit: 2016-07-04 | Payer: Self-pay | Source: Ambulatory Visit | Admitting: Radiation Oncology

## 2017-07-15 IMAGING — CT NM PET TUM IMG RESTAG (PS) SKULL BASE T - THIGH
1 of 8 series · 1 of 25 positions shown · non-contrast
Comparison: PET-CT 12/16/2013.  Chest CT 10/13/2014.

CLINICAL DATA: Subsequent treatment strategy for right upper lobe
lung cancer post SBRT.

EXAM:
NUCLEAR MEDICINE PET SKULL BASE TO THIGH
TECHNIQUE: 7.5 mCi F-18 FDG was injected intravenously. Full-ring PET imaging
was performed from the skull base to thigh after the radiotracer. CT
data was obtained and used for attenuation correction and anatomic
localization.
FASTING BLOOD GLUCOSE:  Value: 183 mg/dl

[Series 4: ct sk_thigh 5.0 b31f · axial · 5.0mm · 0.85mm/px · 1 of 214 slices shown]
[im 214/214  brain]
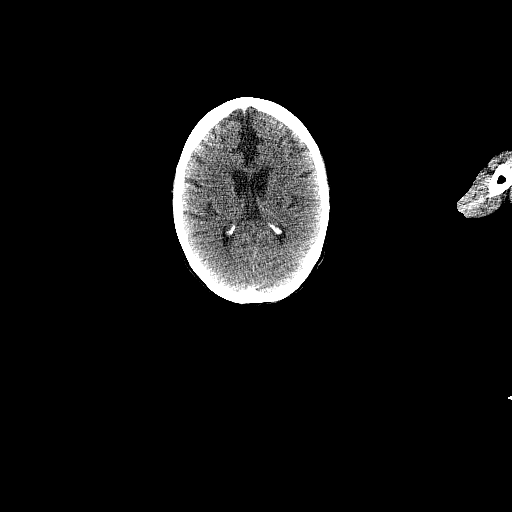

[1 of 25 positions shown; findings below may reference images not displayed]

FINDINGS: NECK

No hypermetabolic cervical lymph nodes are identified.There are no
lesions of the pharyngeal mucosal space. There is stable homogeneous
thyroid activity. Intracranial atrophy noted.

CHEST

The enlarging mediastinal lymph nodes demonstrated on recent CT are
hypermetabolic. Low right paratracheal node measuring approximately
14 mm short axis on image 61 has an SUV max of 6.2. 1.3 cm
subcarinal node has an SUV max of 7.2. 11 mm node anterior to the
right hilum has an SUV max of 7.6. There is low-level metabolic
activity throughout the right upper lobe consolidation, most likely
representing radiation pneumonitis. However, along the posterior
margin of this process, there is residual focal hypermetabolic
activity with an SUV max of 12.0. This corresponds with the original
lesion and appears slightly larger than on prior PET-CT, measuring
up to 2.7 cm on image 20. No other suspicious pulmonary activity.
There is linear left lower lobe scarring or atelectasis. Diffuse
atherosclerosis noted status post median sternotomy, CABG and valve
replacement. There are trace pleural effusions.

ABDOMEN/PELVIS

There is no hypermetabolic activity within the liver, adrenal
glands, spleen or pancreas. There is no hypermetabolic nodal
activity. Urine contamination noted in the perineal region and anal
crease. Hepatic steatosis, right renal cyst, atherosclerosis and
previous pelvic lymph node dissection noted.

SKELETON

There is no hypermetabolic activity to suggest osseous metastatic
disease.
IMPRESSION: 1. Enlarging focus of hypermetabolism at site of original right
upper lobe lesion consistent with recurrent disease. Surrounding
pulmonary opacities demonstrate only low level metabolic activity,
most consistent with radiation pneumonitis.
2. New right hilar and mediastinal nodal metastases.
3. No distant metastases identified.
